# Patient Record
Sex: Male | Born: 1935 | Race: White | Hispanic: No | Smoking: Former smoker
Health system: Southern US, Community
[De-identification: ages and names within clinical notes are randomized; demographics above are authoritative.]

## PROBLEM LIST (undated history)

## (undated) DIAGNOSIS — R0600 Dyspnea, unspecified: Secondary | ICD-10-CM

## (undated) DIAGNOSIS — M4800 Spinal stenosis, site unspecified: Secondary | ICD-10-CM

## (undated) DIAGNOSIS — I499 Cardiac arrhythmia, unspecified: Secondary | ICD-10-CM

## (undated) DIAGNOSIS — H9190 Unspecified hearing loss, unspecified ear: Secondary | ICD-10-CM

## (undated) DIAGNOSIS — Z87442 Personal history of urinary calculi: Secondary | ICD-10-CM

## (undated) DIAGNOSIS — N200 Calculus of kidney: Secondary | ICD-10-CM

## (undated) DIAGNOSIS — K219 Gastro-esophageal reflux disease without esophagitis: Secondary | ICD-10-CM

## (undated) DIAGNOSIS — M25473 Effusion, unspecified ankle: Secondary | ICD-10-CM

## (undated) DIAGNOSIS — I10 Essential (primary) hypertension: Secondary | ICD-10-CM

## (undated) DIAGNOSIS — M1990 Unspecified osteoarthritis, unspecified site: Secondary | ICD-10-CM

## (undated) DIAGNOSIS — F419 Anxiety disorder, unspecified: Secondary | ICD-10-CM

## (undated) HISTORY — PX: KNEE SURGERY: SHX244

## (undated) HISTORY — PX: TONSILLECTOMY: SUR1361

## (undated) HISTORY — PX: OTHER SURGICAL HISTORY: SHX169

---

## 1978-11-13 HISTORY — PX: CYSTOSCOPY W/ URETEROSCOPY: SUR379

## 1997-11-25 DIAGNOSIS — C4492 Squamous cell carcinoma of skin, unspecified: Secondary | ICD-10-CM

## 1997-11-25 HISTORY — DX: Squamous cell carcinoma of skin, unspecified: C44.92

## 2000-04-17 ENCOUNTER — Encounter: Payer: Self-pay | Admitting: Internal Medicine

## 2000-04-17 ENCOUNTER — Encounter: Admission: RE | Admit: 2000-04-17 | Discharge: 2000-04-17 | Payer: Self-pay | Admitting: Internal Medicine

## 2001-01-30 ENCOUNTER — Encounter (INDEPENDENT_AMBULATORY_CARE_PROVIDER_SITE_OTHER): Payer: Self-pay | Admitting: Specialist

## 2001-01-30 ENCOUNTER — Other Ambulatory Visit: Admission: RE | Admit: 2001-01-30 | Discharge: 2001-01-30 | Payer: Self-pay | Admitting: Gastroenterology

## 2004-01-08 ENCOUNTER — Encounter: Admission: RE | Admit: 2004-01-08 | Discharge: 2004-01-08 | Payer: Self-pay | Admitting: Internal Medicine

## 2004-01-22 ENCOUNTER — Encounter: Admission: RE | Admit: 2004-01-22 | Discharge: 2004-01-22 | Payer: Self-pay | Admitting: Internal Medicine

## 2004-11-21 DIAGNOSIS — C4491 Basal cell carcinoma of skin, unspecified: Secondary | ICD-10-CM

## 2004-11-21 HISTORY — DX: Basal cell carcinoma of skin, unspecified: C44.91

## 2005-10-16 ENCOUNTER — Encounter: Admission: RE | Admit: 2005-10-16 | Discharge: 2005-10-16 | Payer: Self-pay | Admitting: Internal Medicine

## 2006-10-08 ENCOUNTER — Ambulatory Visit (HOSPITAL_COMMUNITY): Admission: RE | Admit: 2006-10-08 | Discharge: 2006-10-08 | Payer: Self-pay | Admitting: Internal Medicine

## 2007-04-23 ENCOUNTER — Encounter: Admission: RE | Admit: 2007-04-23 | Discharge: 2007-04-23 | Payer: Self-pay | Admitting: Internal Medicine

## 2007-08-26 DIAGNOSIS — C4491 Basal cell carcinoma of skin, unspecified: Secondary | ICD-10-CM

## 2007-08-26 HISTORY — DX: Basal cell carcinoma of skin, unspecified: C44.91

## 2008-06-02 ENCOUNTER — Encounter: Admission: RE | Admit: 2008-06-02 | Discharge: 2008-06-02 | Payer: Self-pay | Admitting: Internal Medicine

## 2008-07-17 DIAGNOSIS — C4492 Squamous cell carcinoma of skin, unspecified: Secondary | ICD-10-CM

## 2008-07-17 HISTORY — DX: Squamous cell carcinoma of skin, unspecified: C44.92

## 2012-04-07 ENCOUNTER — Emergency Department (HOSPITAL_COMMUNITY)
Admission: EM | Admit: 2012-04-07 | Discharge: 2012-04-07 | Disposition: A | Discharge status: Home / Self Care | Payer: Medicare Other | Attending: Emergency Medicine | Admitting: Emergency Medicine

## 2012-04-07 ENCOUNTER — Encounter (HOSPITAL_COMMUNITY): Payer: Self-pay | Admitting: Emergency Medicine

## 2012-04-07 DIAGNOSIS — N4889 Other specified disorders of penis: Secondary | ICD-10-CM | POA: Insufficient documentation

## 2012-04-07 DIAGNOSIS — I10 Essential (primary) hypertension: Secondary | ICD-10-CM | POA: Insufficient documentation

## 2012-04-07 DIAGNOSIS — B372 Candidiasis of skin and nail: Secondary | ICD-10-CM

## 2012-04-07 HISTORY — DX: Calculus of kidney: N20.0

## 2012-04-07 HISTORY — DX: Essential (primary) hypertension: I10

## 2012-04-07 NOTE — ED Provider Notes (Signed)
History     CSN: [id number]  Arrival date & time 04/07/12  1034   First MD Initiated Contact with Patient 04/07/12 1115      Chief Complaint  Patient presents with  . Groin Swelling    hx of recurrent penile swelling, concerned about swelling today    (Consider location/radiation/quality/duration/timing/severity/associated sxs/prior treatment) HPI Patient presents emergency Dept. with a two-day history penile irritation and redness.  Patient, states he has had previous bouts with a similar type issue.  Patient, states he has been using some Lotrimin on it over the last 24 hours.  Patient states that he noticed a little bleeding from the area today.  Patient denies penile discharge, fever, dysuria, or back pain.  States that his testicles are not painful or swollen. Past Medical History  Diagnosis Date  . Hypertension   . Kidney stone     Past Surgical History  Procedure Date  . Tonsillectomy   . Addnoid     Family History  Problem Relation Age of Onset  . Heart failure Father     History  Substance Use Topics  . Smoking status: Never Smoker   . Smokeless tobacco: Not on file  . Alcohol Use: Yes      Review of Systems All other systems negative except as documented in the HPI. All pertinent positives and negatives as reviewed in the HPI.  Allergies  Review of patient's allergies indicates no known allergies.  Home Medications   Current Outpatient Rx  Name Route Sig Dispense Refill  . ASPIRIN 81 MG PO TABS Oral Take 81 mg by mouth daily.    . ATORVASTATIN CALCIUM 40 MG PO TABS Oral Take 40 mg by mouth daily.    Marland Kitchen CLONAZEPAM 0.5 MG PO TABS Oral Take 0.5 mg by mouth 2 (two) times daily.      BP 165/77  Pulse 58  Temp(Src) 97.8 F (36.6 C) (Oral)  Resp 18  SpO2 98%  Physical Exam  Constitutional: He appears well-developed and well-nourished.  Cardiovascular: Normal rate and regular rhythm.   Pulmonary/Chest: Effort normal and breath sounds normal.    Genitourinary: Testes normal.    Penile erythema and penile tenderness present. No discharge found.  Skin: Skin is warm and dry. There is erythema.    ED Course  Procedures (including critical care time) Patient is told to use the Lotrimin twice a day and follow up with his doctor Wednesday, and there is no improvement of his symptoms.  Told to return here for any worsening in his condition.  This is most likely fungal type infection.   MDM          Carlyle Dolly, PA-C 04/07/12 1142  Carlyle Dolly, PA-C 04/07/12 1146

## 2012-04-07 NOTE — ED Notes (Signed)
Pt reports 2 day hx of penile pain and redress, with slight bleeding. Tx with lotrimin cream. Hx of sx for two years

## 2012-04-07 NOTE — ED Provider Notes (Signed)
He has had several days of redness and itching of his penis. No trauma. No dysuria. No pain.  Penis exam: He is circumcised. No paraphimosis. Mild redness in ulceration of the glans penis. No discharge from the urethra. No swelling or deformity. Testes and scrotum normal bilaterally.  Medical screening examination/treatment/procedure(s) were conducted as a shared visit with non-physician practitioner(s) and myself.  I personally evaluated the patient during the encounter  Flint Melter, MD 04/07/12 [phone]

## 2012-04-07 NOTE — Discharge Instructions (Signed)
Keep area clean and dry.  He is a Lotrimin twice a day and followup with your doctor if not improving by Wednesday.

## 2012-07-05 ENCOUNTER — Other Ambulatory Visit: Payer: Self-pay | Admitting: Physician Assistant

## 2012-09-16 ENCOUNTER — Emergency Department (HOSPITAL_COMMUNITY): Payer: Medicare Other

## 2012-09-16 ENCOUNTER — Emergency Department (HOSPITAL_COMMUNITY)
Admission: EM | Admit: 2012-09-16 | Discharge: 2012-09-16 | Disposition: A | Discharge status: Home / Self Care | Payer: Medicare Other | Attending: Emergency Medicine | Admitting: Emergency Medicine

## 2012-09-16 ENCOUNTER — Encounter (HOSPITAL_COMMUNITY): Payer: Self-pay

## 2012-09-16 DIAGNOSIS — Z79899 Other long term (current) drug therapy: Secondary | ICD-10-CM | POA: Insufficient documentation

## 2012-09-16 DIAGNOSIS — Z87891 Personal history of nicotine dependence: Secondary | ICD-10-CM | POA: Insufficient documentation

## 2012-09-16 DIAGNOSIS — Z7982 Long term (current) use of aspirin: Secondary | ICD-10-CM | POA: Insufficient documentation

## 2012-09-16 DIAGNOSIS — N200 Calculus of kidney: Secondary | ICD-10-CM | POA: Insufficient documentation

## 2012-09-16 DIAGNOSIS — F411 Generalized anxiety disorder: Secondary | ICD-10-CM | POA: Insufficient documentation

## 2012-09-16 DIAGNOSIS — N23 Unspecified renal colic: Secondary | ICD-10-CM

## 2012-09-16 DIAGNOSIS — I10 Essential (primary) hypertension: Secondary | ICD-10-CM | POA: Insufficient documentation

## 2012-09-16 HISTORY — DX: Anxiety disorder, unspecified: F41.9

## 2012-09-16 LAB — COMPREHENSIVE METABOLIC PANEL
Albumin: 3.9 g/dL (ref 3.5–5.2)
BUN: 13 mg/dL (ref 6–23)
Calcium: 9.4 mg/dL (ref 8.4–10.5)
Creatinine, Ser: 0.98 mg/dL (ref 0.50–1.35)
GFR calc Af Amer: 85 mL/min — ABNORMAL LOW (ref >90)
Glucose, Bld: 102 mg/dL — ABNORMAL HIGH (ref 70–99)
Total Protein: 7.4 g/dL (ref 6.0–8.3)

## 2012-09-16 LAB — CBC
HCT: 43 % (ref 39.0–52.0)
MCV: 91.5 fL (ref 78.0–100.0)
RBC: 4.7 MIL/uL (ref 4.22–5.81)
RDW: 12.7 % (ref 11.5–15.5)
WBC: 10.5 10*3/uL (ref 4.0–10.5)

## 2012-09-16 LAB — URINALYSIS, ROUTINE W REFLEX MICROSCOPIC
Bilirubin Urine: NEGATIVE
Hgb urine dipstick: NEGATIVE
Ketones, ur: NEGATIVE mg/dL
Nitrite: NEGATIVE
Protein, ur: NEGATIVE mg/dL
Specific Gravity, Urine: 1.02 (ref 1.005–1.030)
Urobilinogen, UA: 0.2 mg/dL (ref 0.0–1.0)

## 2012-09-16 LAB — POCT I-STAT TROPONIN I: Troponin i, poc: 0 ng/mL (ref 0.00–0.08)

## 2012-09-16 MED ORDER — ONDANSETRON 8 MG PO TBDP: ORAL_TABLET | ORAL | Status: DC

## 2012-09-16 MED ORDER — SODIUM CHLORIDE 0.9 % IV BOLUS (SEPSIS)
500.0000 mL | Freq: Once | INTRAVENOUS | Status: AC
  Administered 2012-09-16: 500 mL via INTRAVENOUS

## 2012-09-16 MED ORDER — FENTANYL CITRATE 0.05 MG/ML IJ SOLN
100.0000 ug | Freq: Once | INTRAMUSCULAR | Status: AC
  Administered 2012-09-16: 100 ug via INTRAVENOUS
  Filled 2012-09-16: qty 2

## 2012-09-16 MED ORDER — ONDANSETRON HCL 4 MG/2ML IJ SOLN
4.0000 mg | Freq: Once | INTRAMUSCULAR | Status: AC
  Administered 2012-09-16: 4 mg via INTRAVENOUS
  Filled 2012-09-16: qty 2

## 2012-09-16 MED ORDER — OXYCODONE-ACETAMINOPHEN 5-325 MG PO TABS: 2.0000 | ORAL_TABLET | ORAL | Status: DC | PRN

## 2012-09-16 MED ORDER — SODIUM CHLORIDE 0.9 % IV SOLN: Freq: Once | INTRAVENOUS | Status: DC

## 2012-09-16 MED ORDER — SODIUM CHLORIDE 0.9 % IV SOLN: INTRAVENOUS | Status: DC

## 2012-09-16 NOTE — ED Notes (Signed)
Pt presents with NAD- Pt c/o of left side flank pain- denies N/V/d and fever and trouble urinating.

## 2012-09-16 NOTE — ED Provider Notes (Signed)
History     CSN: [id number]  Arrival date & time 09/16/12  0158   First MD Initiated Contact with Patient 09/16/12 0308      Chief Complaint  Patient presents with  . Flank Pain    (Consider location/radiation/quality/duration/timing/severity/associated sxs/prior treatment) HPI This 78 year old complains of a two-day history of intermittent left flank colicky pain now present for over 12 hours of nausea without vomiting or diarrhea. He is no dysuria or testicular pain. He is no chest pain cough or shortness of breath. He is no midline back pain or pain down his legs. He is no weakness or numbness to his legs. He is no lightheadedness or syncope. There is no treatment prior to arrival. His last episode of kidney stones as in the 1980s. There's been no trauma or fever. His pain is moderately severe. His pain waxes and wanes in intensity. Past Medical History  Diagnosis Date  . Hypertension   . Kidney stone   . Anxiety     Past Surgical History  Procedure Date  . Tonsillectomy   . Addnoid   . Knee surgery     Family History  Problem Relation Age of Onset  . Heart failure Father     History  Substance Use Topics  . Smoking status: Former Games developer  . Smokeless tobacco: Not on file  . Alcohol Use: Yes      Review of Systems 10 Systems reviewed and are negative for acute change except as noted in the HPI. Allergies  Review of patient's allergies indicates no known allergies.  Home Medications   Current Outpatient Rx  Name  Route  Sig  Dispense  Refill  . ASPIRIN 81 MG PO TABS   Oral   Take 81 mg by mouth daily.         . ATORVASTATIN CALCIUM 40 MG PO TABS   Oral   Take 40 mg by mouth daily.         Marland Kitchen CLONAZEPAM 0.5 MG PO TABS   Oral   Take 1 mg by mouth at bedtime.          Marland Kitchen ONDANSETRON 8 MG PO TBDP      8mg  ODT q4 hours prn nausea   4 tablet   0   . OXYCODONE-ACETAMINOPHEN 5-325 MG PO TABS   Oral   Take 2 tablets by mouth every 4 (four) hours  as needed for pain.   20 tablet   0     BP 117/48  Pulse 77  Temp 98 F (36.7 C) (Oral)  Resp 15  Ht 5\' 7"  (1.702 m)  Wt 168 lb (76.204 kg)  BMI 26.31 kg/m2  SpO2 95%  Physical Exam  Nursing note and vitals reviewed. Constitutional:       Awake, alert, nontoxic appearance.  HENT:  Head: Atraumatic.  Eyes: Right eye exhibits no discharge. Left eye exhibits no discharge.  Neck: Neck supple.  Cardiovascular: Normal rate and regular rhythm.   No murmur heard. Pulmonary/Chest: Effort normal and breath sounds normal. No respiratory distress. He has no wheezes. He has no rales. He exhibits no tenderness.  Abdominal: Soft. Bowel sounds are normal. He exhibits no distension and no mass. There is tenderness. There is no rebound and no guarding.       Moderate left lower quadrant abdominal tenderness and left flank tenderness without rebound; no midline back tenderness.  Musculoskeletal: He exhibits no edema and no tenderness.       Baseline ROM, no  obvious new focal weakness.  Neurological: He is alert.       Mental status and motor strength appears baseline for patient and situation.  Skin: No rash noted.  Psychiatric: He has a normal mood and affect.    ED Course  Procedures (including critical care time) ECG: Sinus rhythm, ventricular rate 59, normal axis, normal intervals, no acute ischemic changes noted, no comparison ECG available   Labs Reviewed  COMPREHENSIVE METABOLIC PANEL - Abnormal; Notable for the following:    Glucose, Bld 102 (*)     GFR calc non Af Amer 73 (*)     GFR calc Af Amer 85 (*)     All other components within normal limits  URINALYSIS, ROUTINE W REFLEX MICROSCOPIC  CBC  LIPASE, BLOOD  POCT I-STAT TROPONIN I   Ct Abdomen Pelvis Wo Contrast  09/16/2012  *RADIOLOGY REPORT*  Clinical Data: Left flank pain.  History of renal stones.  CT ABDOMEN AND PELVIS WITHOUT CONTRAST  Technique:  Multidetector CT imaging of the abdomen and pelvis was performed  following the standard protocol without intravenous contrast.  Comparison: None.  Findings: Chronic peripheral changes are noted at the lung bases; the visualized lung bases are otherwise clear.  Scattered coronary artery calcifications are seen.  The liver and spleen are unremarkable in appearance.  The gallbladder is within normal limits.  The pancreas and adrenal glands are unremarkable.  There is mild left-sided hydronephrosis, with prominence of the left ureter to the level of an obstructing 7 x 4 mm stone in the proximal to mid left ureter, 7 cm below the left renal hilum. Associated prominent left-sided perinephric stranding is seen.  More mild right-sided perinephric stranding is noted.  A focus of increased attenuation at the upper pole of the right kidney is nonspecific, though on coronal images the appearance suggests milk of magnesia.  A 1.9 cm cyst is noted at the medial aspect of the left kidney.  No free fluid is identified.  The small bowel is unremarkable in appearance.  The stomach is within normal limits.  No acute vascular abnormalities are seen.  Prominent calcification is noted along the abdominal aorta and its branches.  The appendix is not definitely characterized; there is no evidence for appendicitis.  Incidental note is made of a moderate to large left-sided inguinal hernia, containing a short segment of proximal sigmoid colon, without evidence for obstruction or strangulation. A small to moderate right-sided inguinal hernia is also seen, containing only fat.  The colon is otherwise unremarkable in appearance.  The bladder is mildly distended and grossly unremarkable in appearance.  The prostate remains normal in size.  No inguinal lymphadenopathy is seen.  No acute osseous abnormalities are identified.  IMPRESSION:  1.  Mild left-sided hydronephrosis, with an obstructing 7 x 4 mm stone noted in the proximal to mid left ureter, 7 cm below the left renal hilum. 2.  Moderate to large  left-sided inguinal hernia, containing a short segment of proximal sigmoid colon, without evidence for obstruction or strangulation. 3.  Small to moderate right-sided inguinal hernia, containing only fat. 4.  Prominent calcification along the abdominal aorta and its branches; mild scattered coronary artery calcifications seen. 5.  Likely focus of milk of magnesia at the right kidney. 6.  Chronic peripheral changes noted at the lung bases.   Original Report Authenticated By: Tonia Ghent, M.D.      1. Ureteral colic       MDM  Pt stable in ED with  no significant deterioration in condition.  Patient / Family / Caregiver informed of clinical course, understand medical decision-making process, and agree with plan.  I doubt any other EMC precluding discharge at this time including, but not necessarily limited to the following:AAA.        Hurman Horn, MD 09/16/12 [phone]

## 2012-09-16 NOTE — ED Notes (Signed)
Pt states that he began having left flank pain on Saturday that has become progressively worse. Pt states he had a kidney stone in 1980 and that this feels very similar.

## 2012-09-24 ENCOUNTER — Other Ambulatory Visit: Payer: Self-pay | Admitting: Urology

## 2012-09-25 ENCOUNTER — Encounter (HOSPITAL_COMMUNITY): Payer: Self-pay | Admitting: *Deleted

## 2012-09-25 NOTE — Pre-Procedure Instructions (Signed)
Asked to bring blue folder the day of the procedure,insurance card,I.D. driver's license,wear comfortable clothing and have a driver for the day. Asked not to take Advil,Motrin,Ibuprofen,Aleve or any NSAIDS, Aspirin, or Toradol for 72 hours prior to procedure,  No vitamins or herbal medications 7 days prior to procedure.  Will stop Aspirin per policy. Instructed to take laxative per doctor's office instructions and eat a light dinner the evening before procedure.   To arrive at 0915 for lithotripsy procedure.

## 2012-09-30 ENCOUNTER — Encounter (HOSPITAL_COMMUNITY): Payer: Self-pay | Admitting: *Deleted

## 2012-09-30 ENCOUNTER — Encounter (HOSPITAL_COMMUNITY)
Admission: RE | Disposition: A | Discharge status: Home / Self Care | Payer: Self-pay | Source: Ambulatory Visit | Attending: Urology

## 2012-09-30 ENCOUNTER — Ambulatory Visit (HOSPITAL_COMMUNITY): Payer: Medicare Other

## 2012-09-30 ENCOUNTER — Ambulatory Visit (HOSPITAL_COMMUNITY)
Admission: RE | Admit: 2012-09-30 | Discharge: 2012-09-30 | Disposition: A | Discharge status: Home / Self Care | Payer: Medicare Other | Source: Ambulatory Visit | Attending: Urology | Admitting: Urology

## 2012-09-30 DIAGNOSIS — N201 Calculus of ureter: Secondary | ICD-10-CM | POA: Insufficient documentation

## 2012-09-30 HISTORY — DX: Gastro-esophageal reflux disease without esophagitis: K21.9

## 2012-09-30 SURGERY — LITHOTRIPSY, ESWL
Anesthesia: LOCAL | Laterality: Left

## 2012-09-30 MED ORDER — SODIUM CHLORIDE 0.9 % IV SOLN
INTRAVENOUS | Status: DC
  Administered 2012-09-30: 10:00:00 via INTRAVENOUS

## 2012-09-30 MED ORDER — CEFAZOLIN SODIUM-DEXTROSE 2-3 GM-% IV SOLR
2.0000 g | INTRAVENOUS | Status: AC
  Administered 2012-09-30: 2 g via INTRAVENOUS
  Filled 2012-09-30: qty 50

## 2012-09-30 MED ORDER — DIPHENHYDRAMINE HCL 25 MG PO CAPS
25.0000 mg | ORAL_CAPSULE | ORAL | Status: AC
  Administered 2012-09-30: 25 mg via ORAL
  Filled 2012-09-30: qty 1

## 2012-09-30 MED ORDER — DIAZEPAM 5 MG PO TABS
10.0000 mg | ORAL_TABLET | ORAL | Status: AC
  Administered 2012-09-30: 10 mg via ORAL
  Filled 2012-09-30: qty 2

## 2012-09-30 NOTE — H&P (Signed)
Stephen Cuevas is an 78 y.o. male.   Chief Complaint: Pre-Op Left Shockwave Lithotripsy HPI:   1 - Left Ureteral Stone - Pt with left mid 7x62mm stone by CT 09/16/2012 found on w/u of flank pain. Follow-Up KUB confirms in same location at L4-L5. No additional stones. No fever or infectious parameters. One piror episode treated with URS in 1980s. Pain presently controlled and pt straining urine.  PMH unremarkable. No CV disease. 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  Today Molly Maduro is seen for shockwave lithotripsy. Denies interval fevers or stone passage. His UCX from the office was negative.   Past Medical History  Diagnosis Date  . Hypertension   . Anxiety   . Kidney stone   . GERD (gastroesophageal reflux disease)     no treatment    Past Surgical History  Procedure Date  . Tonsillectomy   . Addnoid   . Knee surgery   . Cystoscopy w/ ureteroscopy 1980    Family History  Problem Relation Age of Onset  . Heart failure Father    Social History:  reports that he quit smoking about 56 years ago. He does not have any smokeless tobacco history on file. He reports that he drinks alcohol. He reports that he does not use illicit drugs.  Allergies: No Known Allergies  No prescriptions prior to admission    No results found for this or any previous visit (from the past 48 hour(s)). No results found.  Review of Systems  Constitutional: Negative.  Negative for fever and chills.  HENT: Positive for hearing loss.   Eyes: Negative.   Respiratory: Negative.   Cardiovascular: Negative.   Gastrointestinal: Negative.   Genitourinary: Negative.  Negative for hematuria.  Musculoskeletal: Negative.   Skin: Negative.   Neurological: Negative.   Endo/Heme/Allergies: Negative.   Psychiatric/Behavioral: Negative.     There were no vitals taken for this visit. Physical Exam  Constitutional: He is oriented to person, place, and time.  He appears well-developed and well-nourished.       Hard of hearing, but otherwise very vigorous for age  HENT:  Head: Normocephalic and atraumatic.  Eyes: EOM are normal. Pupils are equal, round, and reactive to light.  Neck: Normal range of motion. Neck supple.  Cardiovascular: Normal rate and regular rhythm.   Respiratory: Effort normal and breath sounds normal.  GI: Soft. Bowel sounds are normal.  Genitourinary: Penis normal.       Mild Left CVAT  Musculoskeletal: Normal range of motion.  Neurological: He is alert and oriented to person, place, and time.  Skin: Skin is warm and dry.  Psychiatric: He has a normal mood and affect. His behavior is normal. Judgment and thought content normal.     Assessment/Plan  1 - Left Ureteral Stone - We  Again discussed shockwave lithotripsy in detail as well as my "rule of 9s" with stones <35mm, less than 900 HU, and skin to stone distance <9cm having approximately 90% treatment success with single session of treatment. We then addressed how stones that are larger, more dense, and in patients with less favorable anatomy have incrementally decreased success rates. We discussed risks including, bleeding, infection, hematoma, loss of kidney, need for staged therapy, need for adjunctive therapy and requirement to refrain from any anticoagulants, anti-platelet or aspirin-like products peri-procedureally.   Proceed with Left SWL today. Has f/u 10/14/12 at 1:15 pm for KUB and MD visit.  Helane Briceno  09/30/2012, 6:32 AM

## 2012-10-30 ENCOUNTER — Ambulatory Visit
Admission: RE | Admit: 2012-10-30 | Discharge: 2012-10-30 | Disposition: A | Discharge status: Home / Self Care | Payer: Medicare Other | Source: Ambulatory Visit | Attending: Internal Medicine | Admitting: Internal Medicine

## 2012-10-30 ENCOUNTER — Other Ambulatory Visit: Payer: Self-pay | Admitting: Internal Medicine

## 2012-10-30 DIAGNOSIS — S6990XA Unspecified injury of unspecified wrist, hand and finger(s), initial encounter: Secondary | ICD-10-CM

## 2013-03-24 ENCOUNTER — Other Ambulatory Visit: Payer: Self-pay | Admitting: Dermatology

## 2013-04-23 ENCOUNTER — Encounter (INDEPENDENT_AMBULATORY_CARE_PROVIDER_SITE_OTHER): Payer: Self-pay | Admitting: Surgery

## 2013-04-23 ENCOUNTER — Ambulatory Visit (INDEPENDENT_AMBULATORY_CARE_PROVIDER_SITE_OTHER): Payer: Medicare Other | Admitting: Surgery

## 2013-04-23 VITALS — BP 140/76 | HR 60 | Temp 97.8°F | Resp 16 | Ht 64.0 in | Wt 169.6 lb

## 2013-04-23 DIAGNOSIS — K4020 Bilateral inguinal hernia, without obstruction or gangrene, not specified as recurrent: Secondary | ICD-10-CM

## 2013-04-23 NOTE — Progress Notes (Signed)
Referred for bilateral inguinal herniae  Chief Complaint:  Left inguinal hernia with some discomfort noted after lithotripsy  History of Present Illness:  Stephen Cuevas is an 79 y.o. male who comes in with his son, Stephen Cuevas who is an old neighbor of mine with a inguinal hernia. 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 On CT scan which was done to manage a kidney stone he was seen to have a inguinal hernia. He's been having some pains in his legs when he drives that is not typical for seen hernia. I question whether he could be having some sciatic nerve pain. He denies any nausea or vomiting and I did educate him on what to look for in case he is obstructed from this.  I discussed management strategies including observation, open and laparoscopic hernia repairs. I examined him and found his testes were normal and he had bilateral inguinal hernias with the left side being larger than the right.  Past Medical History  Diagnosis Date  . Hypertension   . Anxiety   . Kidney stone   . GERD (gastroesophageal reflux disease)     no treatment    Past Surgical History  Procedure Laterality Date  . Tonsillectomy    . Addnoid    . Knee surgery    . Cystoscopy w/ ureteroscopy  1980    Current Outpatient Prescriptions  Medication Sig Dispense Refill  . aspirin 81 MG tablet Take 81 mg by mouth daily.      Marland Kitchen atorvastatin (LIPITOR) 40 MG tablet Take 40 mg by mouth daily.      . clonazePAM (KLONOPIN) 0.5 MG tablet Take 1 mg by mouth at bedtime.       . clotrimazole (LOTRIMIN) 1 % cream Apply 1 application topically daily.       No current facility-administered medications for this visit.   Review of patient's allergies indicates no known allergies. Family History  Problem Relation Age of Onset  . Heart failure Father   . Heart disease Father    Social History:   reports that he quit smoking about 57 years ago. He has never used smokeless tobacco. He reports that  drinks  alcohol. He reports that he does not use illicit drugs.   REVIEW OF SYSTEMS - PERTINENT POSITIVES ONLY: Active where were the works at [employer] hospital  Physical Exam:   Blood pressure 140/76, pulse 60, temperature 97.8 F (36.6 C), temperature source Temporal, resp. rate 16, height 5\' 4"  (1.626 m), weight 169 lb 9.6 oz (76.93 kg). Body mass index is 29.1 kg/(m^2).  Gen:  WDWN white male NAD  Neurological: Alert and oriented to person, place, and time. Motor and sensory function is grossly intact  Head: Normocephalic and atraumatic.  Eyes: Conjunctivae are normal. Pupils are equal, round, and reactive to light. No scleral icterus.  Neck: Normal range of motion. Neck supple. No tracheal deviation or thyromegaly present.  Cardiovascular:  SR without murmurs or gallops.  No carotid bruits Respiratory: Effort normal.  No respiratory distress. No chest wall tenderness. Breath sounds normal.  No wheezes, rales or rhonchi.  Abdomen:  Bilateral inguinal hernias the left being bigger than the right. GU: Musculoskeletal: Normal range of motion. Extremities are nontender. No cyanosis, edema or clubbing noted Lymphadenopathy: No cervical, preauricular, postauricular or axillary adenopathy is present Skin: Skin is warm and dry. No rash noted. No diaphoresis. No erythema. No pallor. Pscyh: Normal mood and affect. Behavior is normal. Judgment and  thought content normal.   LABORATORY RESULTS: No results found for this or any previous visit (from the past 48 hour(s)).  RADIOLOGY RESULTS: No results found.  Problem List: There are no active problems to display for this patient.   Assessment & Plan:  Bilateral inguinal hernias left greater than right I think it would be reasonable to manage these expectantly. I am not convinced that his left leg pain is related to his hernia as it could well be a sciatica or it could also be because he wears his wall on the left side.he may look into getting a truss  but I told him that we could see him back in 4 months or sooner should he become more symptomatic. 4 months we assess how he is doing he may consider continued observation.    Matt B. Daphine Deutscher, MD, [facility] [phone] beeper [phone]  04/23/2013 2:13 PM

## 2013-04-23 NOTE — Patient Instructions (Signed)

## 2013-05-21 ENCOUNTER — Encounter (INDEPENDENT_AMBULATORY_CARE_PROVIDER_SITE_OTHER): Payer: Self-pay

## 2013-07-02 ENCOUNTER — Emergency Department (HOSPITAL_COMMUNITY)
Admission: EM | Admit: 2013-07-02 | Discharge: 2013-07-02 | Disposition: A | Discharge status: Home / Self Care | Payer: Medicare Other | Attending: Emergency Medicine | Admitting: Emergency Medicine

## 2013-07-02 ENCOUNTER — Emergency Department (HOSPITAL_COMMUNITY): Payer: Medicare Other

## 2013-07-02 ENCOUNTER — Encounter (HOSPITAL_COMMUNITY): Payer: Self-pay | Admitting: Emergency Medicine

## 2013-07-02 DIAGNOSIS — I10 Essential (primary) hypertension: Secondary | ICD-10-CM | POA: Insufficient documentation

## 2013-07-02 DIAGNOSIS — S93409A Sprain of unspecified ligament of unspecified ankle, initial encounter: Secondary | ICD-10-CM | POA: Insufficient documentation

## 2013-07-02 DIAGNOSIS — Z7982 Long term (current) use of aspirin: Secondary | ICD-10-CM | POA: Insufficient documentation

## 2013-07-02 DIAGNOSIS — F411 Generalized anxiety disorder: Secondary | ICD-10-CM | POA: Insufficient documentation

## 2013-07-02 DIAGNOSIS — Z79899 Other long term (current) drug therapy: Secondary | ICD-10-CM | POA: Insufficient documentation

## 2013-07-02 DIAGNOSIS — Y9269 Other specified industrial and construction area as the place of occurrence of the external cause: Secondary | ICD-10-CM | POA: Insufficient documentation

## 2013-07-02 DIAGNOSIS — Z9889 Other specified postprocedural states: Secondary | ICD-10-CM | POA: Insufficient documentation

## 2013-07-02 DIAGNOSIS — X503XXA Overexertion from repetitive movements, initial encounter: Secondary | ICD-10-CM | POA: Insufficient documentation

## 2013-07-02 DIAGNOSIS — Z8719 Personal history of other diseases of the digestive system: Secondary | ICD-10-CM | POA: Insufficient documentation

## 2013-07-02 DIAGNOSIS — Y9301 Activity, walking, marching and hiking: Secondary | ICD-10-CM | POA: Insufficient documentation

## 2013-07-02 DIAGNOSIS — Z87442 Personal history of urinary calculi: Secondary | ICD-10-CM | POA: Insufficient documentation

## 2013-07-02 DIAGNOSIS — Z87891 Personal history of nicotine dependence: Secondary | ICD-10-CM | POA: Insufficient documentation

## 2013-07-02 NOTE — ED Notes (Signed)
MD at bedside. 

## 2013-07-02 NOTE — ED Notes (Signed)
Pt c/o lt ankle pain.  Denies injury.  States his pain started Monday.  States it is swollen.

## 2013-07-02 NOTE — ED Provider Notes (Signed)
Medical screening examination/treatment/procedure(s) were conducted as a shared visit with non-physician practitioner(s) and myself.  I personally evaluated the patient during the encounter  Patient is overall well-appearing.  He has some tenderness and swelling of his left lateral ankle.  Normal pulses in left foot.  Full range of motion of left ankle.  Doubt septic arthritis.  No signs of arterial insufficiency.  Some small bruising noted on the lateral aspect of suggest possible ligamentous strain or injury.  Discharge home in good condition.  PCP and orthopedic followup recommended.  He sees [facility].  He understands return to the ER for new or worsening symptoms including but not limited to fever, worsening swelling, worsening pain, numbness tingling or weakness, or development of erythema or warmth.  Lyanne Co, MD 07/02/13 [phone]

## 2013-07-02 NOTE — ED Provider Notes (Signed)
CSN: [id number]     Arrival date & time 07/02/13  4098 History     First MD Initiated Contact with Patient 07/02/13 [phone]     Chief Complaint  Patient presents with  . Foot Pain   (Consider location/radiation/quality/duration/timing/severity/associated sxs/prior Treatment) HPI  Stephen Cuevas is a 79 y.o.male  presents to the ER with complaints of left ankle pain. When he stepped out of bed he noticed that his ankle was very painful. Once he continued walking on it he noticed that the pain started to alleviate with time. 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 He denies his ankle being tender to the touch. He denies swelling in his right ankle. NO redness or warm feeling He denies remembering a specific injury of fall. No weakness, SOB, chest pain, wheezing, hx of lower extremity swelling.   Past Medical History  Diagnosis Date  . Hypertension   . Anxiety   . Kidney stone   . GERD (gastroesophageal reflux disease)     no treatment   Past Surgical History  Procedure Laterality Date  . Tonsillectomy    . Addnoid    . Knee surgery    . Cystoscopy w/ ureteroscopy  1980   Family History  Problem Relation Age of Onset  . Heart failure Father   . Heart disease Father    History  Substance Use Topics  . Smoking status: Former Smoker -- 1.50 packs/day for 20 years    Quit date: 03/13/1956  . Smokeless tobacco: Never Used  . Alcohol Use: Yes    Review of Systems ROS is negative unless otherwise stated in the HPI  Allergies  Review of patient's allergies indicates no known allergies.  Home Medications   Current Outpatient Rx  Name  Route  Sig  Dispense  Refill  . aspirin EC 81 MG tablet   Oral   Take 81 mg by mouth every morning.         Marland Kitchen atorvastatin (LIPITOR) 40 MG tablet   Oral   Take 40 mg by mouth every morning.          . clonazePAM (KLONOPIN) 0.5 MG tablet   Oral   Take 1 mg by mouth at bedtime.         . clotrimazole (LOTRIMIN) 1 % cream   Topical   Apply 1 application topically daily as needed (for infection of penis).           BP 160/83  Pulse 69  Temp(Src) 98.7 F (37.1 C) (Oral)  Resp 20  SpO2 97% Physical Exam  Nursing note and vitals reviewed. Constitutional: He appears well-developed and well-nourished. No distress.  HENT:  Head: Normocephalic and atraumatic.   HEENT: Anicteric.  No pallor.  No discharge from ears, eyes, nose, or mouth.   Eyes: Pupils are equal, round, and reactive to light.  Neck: Normal range of motion. Neck supple.  Cardiovascular: Normal rate and regular rhythm.   Pulmonary/Chest: Effort normal.  Abdominal: Soft.  Musculoskeletal:       Left ankle: He exhibits swelling. He exhibits normal range of motion, no ecchymosis, no deformity, no laceration and normal pulse. Tenderness (talofibular ligament). Achilles tendon normal.  Neurological: He is alert.  Skin: Skin is warm and dry.    ED Course   Procedures (including critical care time)  Labs Reviewed - No data to display Dg Ankle Complete Left  07/02/2013   *RADIOLOGY REPORT*  Clinical Data: Foot pain  LEFT ANKLE COMPLETE - 3+ VIEW  Comparison: None.  Findings: The ankle mortise intact.  The talar dome is normal.  No malleolar fracture.  No joint effusion.  IMPRESSION: No acute osseous abnormality.   Original Report Authenticated By: Genevive Bi, M.D.   1. High ankle sprain, left, initial encounter     MDM  Dr. Patria Mane went to say hello to patient as well and discussed diagnosis and treatment plan.  Pt denies needing pain medication and advised to take tylenol at home. Declines pain medication here. 64 y.o.Stephen Stephen Cuevas's evaluation in the Emergency Department is complete. It has been determined that no acute conditions requiring further emergency intervention are present at this time. The patient/guardian have been advised of the diagnosis and plan. We have discussed signs  and symptoms that warrant return to the ED, such as changes or worsening in symptoms.  Vital signs are stable at discharge. Filed Vitals:   07/02/13 0846  BP: 160/83  Pulse: 69  Temp: 98.7 F (37.1 C)  Resp: 20    Patient/guardian has voiced understanding and agreed to follow-up with the PCP or specialist.   Dorthula Matas, PA-C 07/02/13 1101

## 2013-08-28 ENCOUNTER — Ambulatory Visit (INDEPENDENT_AMBULATORY_CARE_PROVIDER_SITE_OTHER): Payer: Medicare Other | Admitting: Surgery

## 2013-08-28 ENCOUNTER — Encounter (INDEPENDENT_AMBULATORY_CARE_PROVIDER_SITE_OTHER): Payer: Self-pay

## 2013-08-28 ENCOUNTER — Encounter (INDEPENDENT_AMBULATORY_CARE_PROVIDER_SITE_OTHER): Payer: Self-pay | Admitting: Surgery

## 2013-08-28 VITALS — BP 140/78 | HR 96 | Temp 97.2°F | Resp 16 | Ht 66.0 in | Wt 171.4 lb

## 2013-08-28 DIAGNOSIS — K4020 Bilateral inguinal hernia, without obstruction or gangrene, not specified as recurrent: Secondary | ICD-10-CM

## 2013-08-28 NOTE — Progress Notes (Signed)
[name] 79 y.o.  Body mass index is 27.68 kg/(m^2).  Patient Active Problem List   Diagnosis Date Noted  . Bilateral inguinal hernia 04/23/2013    No Known Allergies  Past Surgical History  Procedure Laterality Date  . Tonsillectomy    . Addnoid    . Knee surgery    . Cystoscopy w/ ureteroscopy  1980   GREEN, EDWIN JAY, MD No diagnosis found.  Followup with [name] and [name] 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 The bilateral hernias and not bother him that much. I think it is age he is lucky to have both mental faculties and recently good physical faculties present. I think doing in elective bilateral anal hernia repair on him would best be done under general and after cardiac clearance. However that being said I think I would have a tendency to observe him. I told him what to look for and hopefully he can avoid having surgery. I'll be glad to see him again as needed I told him to feel free to contact me when these at the hospital and sees me to talk about this problem. Return when necessary Matt B. Daphine Deutscher, MD, [facility] [phone] beeper [phone]  08/28/2013 11:54 AM

## 2013-08-28 NOTE — Patient Instructions (Signed)
Thanks for your patience.  If you need further assistance after leaving the office, please call our office and speak with a [facility] nurse.  [phone].  If you want to leave a message for Dr. Kearia Yin, please call his office phone at [phone]. 

## 2014-04-23 ENCOUNTER — Other Ambulatory Visit: Payer: Self-pay | Admitting: Internal Medicine

## 2014-04-23 ENCOUNTER — Ambulatory Visit
Admission: RE | Admit: 2014-04-23 | Discharge: 2014-04-23 | Disposition: A | Discharge status: Home / Self Care | Payer: Medicare Other | Source: Ambulatory Visit | Attending: Internal Medicine | Admitting: Internal Medicine

## 2014-04-23 DIAGNOSIS — M25552 Pain in left hip: Secondary | ICD-10-CM

## 2014-06-13 ENCOUNTER — Emergency Department (HOSPITAL_COMMUNITY): Payer: Medicare Other

## 2014-06-13 ENCOUNTER — Encounter (HOSPITAL_COMMUNITY): Payer: Self-pay | Admitting: Emergency Medicine

## 2014-06-13 ENCOUNTER — Emergency Department (HOSPITAL_COMMUNITY)
Admission: EM | Admit: 2014-06-13 | Discharge: 2014-06-13 | Disposition: A | Discharge status: Home / Self Care | Payer: Medicare Other | Attending: Emergency Medicine | Admitting: Emergency Medicine

## 2014-06-13 DIAGNOSIS — M25559 Pain in unspecified hip: Secondary | ICD-10-CM | POA: Diagnosis not present

## 2014-06-13 DIAGNOSIS — F411 Generalized anxiety disorder: Secondary | ICD-10-CM | POA: Diagnosis not present

## 2014-06-13 DIAGNOSIS — M25519 Pain in unspecified shoulder: Secondary | ICD-10-CM | POA: Diagnosis present

## 2014-06-13 DIAGNOSIS — K219 Gastro-esophageal reflux disease without esophagitis: Secondary | ICD-10-CM | POA: Diagnosis not present

## 2014-06-13 DIAGNOSIS — I10 Essential (primary) hypertension: Secondary | ICD-10-CM | POA: Insufficient documentation

## 2014-06-13 DIAGNOSIS — Z7982 Long term (current) use of aspirin: Secondary | ICD-10-CM | POA: Diagnosis not present

## 2014-06-13 DIAGNOSIS — Z79899 Other long term (current) drug therapy: Secondary | ICD-10-CM | POA: Diagnosis not present

## 2014-06-13 DIAGNOSIS — M25511 Pain in right shoulder: Secondary | ICD-10-CM

## 2014-06-13 DIAGNOSIS — Z87442 Personal history of urinary calculi: Secondary | ICD-10-CM | POA: Diagnosis not present

## 2014-06-13 DIAGNOSIS — Z87891 Personal history of nicotine dependence: Secondary | ICD-10-CM | POA: Insufficient documentation

## 2014-06-13 DIAGNOSIS — M25552 Pain in left hip: Secondary | ICD-10-CM

## 2014-06-13 MED ORDER — TRAMADOL HCL 50 MG PO TABS
50.0000 mg | ORAL_TABLET | Freq: Once | ORAL | Status: AC
  Administered 2014-06-13: 50 mg via ORAL
  Filled 2014-06-13: qty 1

## 2014-06-13 MED ORDER — TRAMADOL HCL 50 MG PO TABS: 50.0000 mg | ORAL_TABLET | Freq: Four times a day (QID) | ORAL | Status: DC | PRN

## 2014-06-13 NOTE — ED Provider Notes (Signed)
CSN: [id number]     Arrival date & time 06/13/14  5809 History   First MD Initiated Contact with Patient 06/13/14 1007     Chief Complaint  Patient presents with  . Shoulder Pain  . Leg Pain     (Consider location/radiation/quality/duration/timing/severity/associated sxs/prior Treatment) Patient is a 80 y.o. male presenting with shoulder pain and leg pain. The history is provided by the patient.  Shoulder Pain This is a chronic problem. The current episode started more than 1 week ago. The problem occurs daily. The problem has been gradually worsening. Pertinent negatives include no chest pain, no abdominal pain, no headaches and no shortness of breath. Exacerbated by: movement. The symptoms are relieved by rest and NSAIDs. He has tried acetaminophen for the symptoms. The treatment provided mild relief.  Leg Pain Location:  Hip Injury: no   Hip location:  L hip Pain details:    Quality:  Aching   Radiates to:  L leg   Severity:  Moderate   Onset quality:  Gradual   Timing:  Intermittent   Progression:  Worsening Chronicity:  New Dislocation: no   Foreign body present:  No foreign bodies Prior injury to area:  No Worsened by:  Activity, flexion, extension and bearing weight Ineffective treatments:  None tried Associated symptoms: no fever and no neck pain     Past Medical History  Diagnosis Date  . Hypertension   . Anxiety   . Kidney stone   . GERD (gastroesophageal reflux disease)     no treatment   Past Surgical History  Procedure Laterality Date  . Tonsillectomy    . Addnoid    . Knee surgery    . Cystoscopy w/ ureteroscopy  1980   Family History  Problem Relation Age of Onset  . Heart failure Father   . Heart disease Father    History  Substance Use Topics  . Smoking status: Former Smoker -- 1.50 packs/day for 20 years    Quit date: 03/13/1956  . Smokeless tobacco: Never Used  . Alcohol Use: Yes    Review of Systems  Constitutional: Negative for fever.   HENT: Negative for drooling and rhinorrhea.   Eyes: Negative for pain.  Respiratory: Negative for cough and shortness of breath.   Cardiovascular: Negative for chest pain and leg swelling.  Gastrointestinal: Negative for nausea, vomiting, abdominal pain and diarrhea.  Genitourinary: Negative for dysuria and hematuria.  Musculoskeletal: Negative for gait problem and neck pain.       Joint pain  Skin: Negative for color change.  Neurological: Negative for numbness and headaches.  Hematological: Negative for adenopathy.  Psychiatric/Behavioral: Negative for behavioral problems.  All other systems reviewed and are negative.     Allergies  Review of patient's allergies indicates no known allergies.  Home Medications   Prior to Admission medications   Medication Sig Start Date End Date Taking? Authorizing Provider  aspirin EC 81 MG tablet Take 81 mg by mouth every morning.    Historical Provider, MD  atorvastatin (LIPITOR) 40 MG tablet Take 40 mg by mouth every morning.     Historical Provider, MD  clonazePAM (KLONOPIN) 0.5 MG tablet Take 1 mg by mouth at bedtime.     Historical Provider, MD  clotrimazole (LOTRIMIN) 1 % cream Apply 1 application topically daily as needed (for infection of penis).     Historical Provider, MD  omeprazole (PRILOSEC) 20 MG capsule Take 20 mg by mouth 2 (two) times daily at 10 AM and  5 PM.     Historical Provider, MD   BP 157/79  Pulse 97  Temp(Src) 97.9 F (36.6 C) (Oral)  Resp 18  SpO2 99% Physical Exam  Nursing note and vitals reviewed. Constitutional: He is oriented to person, place, and time. He appears well-developed and well-nourished.  HENT:  Head: Normocephalic and atraumatic.  Right Ear: External ear normal.  Left Ear: External ear normal.  Nose: Nose normal.  Mouth/Throat: Oropharynx is clear and moist. No oropharyngeal exudate.  Eyes: Conjunctivae and EOM are normal. Pupils are equal, round, and reactive to light.  Neck: Normal range  of motion. Neck supple.  Cardiovascular: Normal rate, regular rhythm, normal heart sounds and intact distal pulses.  Exam reveals no gallop and no friction rub.   No murmur heard. Pulmonary/Chest: Effort normal and breath sounds normal. No respiratory distress. He has no wheezes.  Abdominal: Soft. Bowel sounds are normal. He exhibits no distension. There is no tenderness. There is no rebound and no guarding.  Musculoskeletal: Normal range of motion. He exhibits no edema and no tenderness.  Normal strength and sensation in all extremities.  2+ distal pulses.  Normal range of motion of the right shoulder. The patient did have reproduction of pain with range of motion of the right shoulder.  Mildly limited range of motion of the left hip due to pain.  Clonus was noted in the left lower extremity.  The right shoulder and left hip are normal in appearance.  Neurological: He is alert and oriented to person, place, and time.  Reflex Scores:      Patellar reflexes are 2+ on the right side and 2+ on the left side. Skin: Skin is warm and dry.  Psychiatric: He has a normal mood and affect. His behavior is normal.    ED Course  Procedures (including critical care time) Labs Review Labs Reviewed - No data to display  Imaging Review Dg Shoulder Right  06/13/2014   CLINICAL DATA:  Right shoulder pain radiating down the right arm for a few weeks.  EXAM: RIGHT SHOULDER - 2+ VIEW  COMPARISON:  Chest radiograph 04/23/2007  FINDINGS: There is mild spurring along the undersurface of the acromion. There is no evidence of acute fracture or dislocation. No lytic or blastic osseous lesion is seen. Visualized right lung is clear.  IMPRESSION: No acute osseous abnormality identified. Mild spurring along the undersurface of the acromion.   Electronically Signed   By: Logan Bores   On: 06/13/2014 11:57   Dg Hip Complete Left  06/13/2014   CLINICAL DATA:  Lateral left hip pain  EXAM: LEFT HIP - COMPLETE 2+ VIEW   COMPARISON:  04/23/2014  FINDINGS: There is no evidence of hip fracture or dislocation. There is no evidence of arthropathy or other focal bone abnormality.  IMPRESSION: Negative.   Electronically Signed   By: Conchita Paris M.D.   On: 06/13/2014 11:57     EKG Interpretation None      MDM   Final diagnoses:  Right shoulder pain  Left hip pain    10:41 AM 80 y.o. male who presents with chronic right shoulder and left hip pain. He notes acute worsening of pain in these joints over the last few weeks. He denies any injuries. States that he recently had a cortisone injection of the right shoulder with minimal benefit. He has been following with his PCP, Dr. Nyoka Cowden, for his joint pains. He denies any fevers, vomiting, or diarrhea. He is afebrile and vital  signs are unremarkable here. He has had decreased sleep due to his joint pain. He is currently taking Tylenol as needed for pain. Will start patient on tramadol and get screening imaging. Will recommend orthopedic followup.  1:04 PM: I interpreted/reviewed the labs and/or imaging which were non-contributory.  Pt would like to f/u w/ [name].  I have discussed the diagnosis/risks/treatment options with the patient and family and believe the pt to be eligible for discharge home to follow-up with [name], call for appt. We also discussed returning to the ED immediately if new or worsening sx occur. We discussed the sx which are most concerning (e.g., worsening pain, fever) that necessitate immediate return. Medications administered to the patient during their visit and any new prescriptions provided to the patient are listed below.  Medications given during this visit Medications  traMADol (ULTRAM) tablet 50 mg (50 mg Oral Given 06/13/14 1046)    Discharge Medication List as of 06/13/2014  1:06 PM    START taking these medications   Details  traMADol (ULTRAM) 50 MG tablet Take 1 tablet (50 mg total) by mouth every 6 (six) hours as needed.,  Starting 06/13/2014, Until Discontinued, Print         Blanchard Kelch, MD 06/13/14 1525

## 2014-06-13 NOTE — ED Notes (Signed)
Pt returned from XRAY 

## 2014-06-13 NOTE — ED Notes (Signed)
Pt transported to [place] by Sam.

## 2014-06-13 NOTE — ED Notes (Addendum)
Pt here with complaint with ongoing right shoulder and leg pain for weeks.   Pt reports bilateral hernia resulting in right leg pain. Pt informed to limit vigorous activity. Per son pt continues to do aggressive yard work. Pt reports painful and slow when starting to walk.  Pt seen at PCP and given cortisone shot to right shoulder. Pt reports pain unrelieved.

## 2014-06-13 NOTE — ED Notes (Signed)
MD Steinl at bedside. 

## 2014-07-07 ENCOUNTER — Encounter (HOSPITAL_COMMUNITY): Payer: Self-pay | Admitting: Emergency Medicine

## 2014-07-07 ENCOUNTER — Emergency Department (HOSPITAL_COMMUNITY)
Admission: EM | Admit: 2014-07-07 | Discharge: 2014-07-07 | Disposition: A | Discharge status: Home / Self Care | Payer: Medicare Other | Attending: Emergency Medicine | Admitting: Emergency Medicine

## 2014-07-07 DIAGNOSIS — Z87891 Personal history of nicotine dependence: Secondary | ICD-10-CM | POA: Diagnosis not present

## 2014-07-07 DIAGNOSIS — Z87442 Personal history of urinary calculi: Secondary | ICD-10-CM | POA: Insufficient documentation

## 2014-07-07 DIAGNOSIS — R609 Edema, unspecified: Secondary | ICD-10-CM

## 2014-07-07 DIAGNOSIS — I10 Essential (primary) hypertension: Secondary | ICD-10-CM | POA: Insufficient documentation

## 2014-07-07 DIAGNOSIS — F411 Generalized anxiety disorder: Secondary | ICD-10-CM | POA: Insufficient documentation

## 2014-07-07 DIAGNOSIS — M79605 Pain in left leg: Secondary | ICD-10-CM

## 2014-07-07 DIAGNOSIS — Z79899 Other long term (current) drug therapy: Secondary | ICD-10-CM | POA: Diagnosis not present

## 2014-07-07 DIAGNOSIS — Z7982 Long term (current) use of aspirin: Secondary | ICD-10-CM | POA: Insufficient documentation

## 2014-07-07 DIAGNOSIS — M79609 Pain in unspecified limb: Secondary | ICD-10-CM | POA: Diagnosis present

## 2014-07-07 DIAGNOSIS — K219 Gastro-esophageal reflux disease without esophagitis: Secondary | ICD-10-CM | POA: Diagnosis not present

## 2014-07-07 MED ORDER — DIAZEPAM 2 MG PO TABS
2.0000 mg | ORAL_TABLET | Freq: Once | ORAL | Status: AC
  Administered 2014-07-07: 2 mg via ORAL
  Filled 2014-07-07: qty 1

## 2014-07-07 MED ORDER — OXYCODONE-ACETAMINOPHEN 5-325 MG PO TABS: 1.0000 | ORAL_TABLET | ORAL | Status: DC | PRN

## 2014-07-07 MED ORDER — OXYCODONE-ACETAMINOPHEN 5-325 MG PO TABS
1.0000 | ORAL_TABLET | Freq: Once | ORAL | Status: AC
  Administered 2014-07-07: 1 via ORAL
  Filled 2014-07-07: qty 1

## 2014-07-07 MED ORDER — DIAZEPAM 2 MG PO TABS: 2.0000 mg | ORAL_TABLET | ORAL | Status: DC | PRN

## 2014-07-07 NOTE — ED Notes (Signed)
Ultrasound at bedside

## 2014-07-07 NOTE — Progress Notes (Signed)
Left lower extremity venous duplex completed.  Left:  No evidence of DVT, superficial thrombosis, or Baker's cyst.  Right:  Negative for DVT in the common femoral vein.  

## 2014-07-07 NOTE — Discharge Instructions (Signed)
Followup with your orthopedist as we have discussed. Do not take the Klonopin if you are feeling drowsy from the other medications that were described for today

## 2014-07-07 NOTE — ED Provider Notes (Signed)
CSN: [id number]     Arrival date & time 07/07/14  4562 History   First MD Initiated Contact with Patient 07/07/14 [phone]     Chief Complaint  Patient presents with  . Leg Pain    left     (Consider location/radiation/quality/duration/timing/severity/associated sxs/prior Treatment) HPI Comments: Patient here complaining of worsening left hip pain. Hasn't seen for this multiple times and has had multiple imaging done including a recent MRI of his L-spine which showed moderate disc disease a long spinal stenosis. Denies any loss of bowel or bladder function. Was treated in the past Valium as well as Percocet with some brief. Recently also seen for neck pain. No recent fever or chills. No urinary symptoms. Symptoms are worse in the morning and improved with walking. No recent trauma. Scheduled to see an orthopedist in the future  Patient is a 80 y.o. male presenting with leg pain. The history is provided by the patient.  Leg Pain   Past Medical History  Diagnosis Date  . Hypertension   . Anxiety   . Kidney stone   . GERD (gastroesophageal reflux disease)     no treatment   Past Surgical History  Procedure Laterality Date  . Tonsillectomy    . Addnoid    . Knee surgery    . Cystoscopy w/ ureteroscopy  1980   Family History  Problem Relation Age of Onset  . Heart failure Father   . Heart disease Father    History  Substance Use Topics  . Smoking status: Former Smoker -- 1.50 packs/day for 20 years    Quit date: 03/13/1956  . Smokeless tobacco: Never Used  . Alcohol Use: Yes    Review of Systems  All other systems reviewed and are negative.     Allergies  Review of patient's allergies indicates no known allergies.  Home Medications   Prior to Admission medications   Medication Sig Start Date End Date Taking? Authorizing Provider  acetaminophen (TYLENOL) 500 MG tablet Take 1,000 mg by mouth daily with breakfast.    Historical Provider, MD  aspirin EC 81 MG tablet Take  162 mg by mouth every morning.     Historical Provider, MD  atorvastatin (LIPITOR) 40 MG tablet Take 40 mg by mouth every morning.     Historical Provider, MD  clonazePAM (KLONOPIN) 0.5 MG tablet Take 1 mg by mouth at bedtime.     Historical Provider, MD  clotrimazole (LOTRIMIN) 1 % cream Apply 1 application topically daily as needed (for infection of penis).     Historical Provider, MD  diphenhydramine-acetaminophen (TYLENOL PM) 25-500 MG TABS Take 1 tablet by mouth at bedtime as needed (for sleep).    Historical Provider, MD  omeprazole (PRILOSEC) 20 MG capsule Take 20 mg by mouth 2 (two) times daily at 10 AM and 5 PM.     Historical Provider, MD  traMADol (ULTRAM) 50 MG tablet Take 1 tablet (50 mg total) by mouth every 6 (six) hours as needed. 06/13/14   Pamella Pert, MD  Triamcinolone Acetonide (KENALOG IJ) Inject as directed. Got in shoulder at dr's office    Historical Provider, MD   BP 149/66  Pulse 91  Temp(Src) 98.6 F (37 C) (Oral)  Resp 16  Wt 171 lb (77.565 kg)  SpO2 99% Physical Exam  Nursing note and vitals reviewed. Constitutional: He is oriented to person, place, and time. He appears well-developed and well-nourished.  Non-toxic appearance. No distress.  HENT:  Head: Normocephalic and atraumatic.  Eyes: Conjunctivae, EOM and lids are normal. Pupils are equal, round, and reactive to light.  Neck: Normal range of motion. Neck supple. No tracheal deviation present. No mass present.  Cardiovascular: Normal rate, regular rhythm and normal heart sounds.  Exam reveals no gallop.   No murmur heard. Pulmonary/Chest: Effort normal and breath sounds normal. No stridor. No respiratory distress. He has no decreased breath sounds. He has no wheezes. He has no rhonchi. He has no rales.  Abdominal: Soft. Normal appearance and bowel sounds are normal. He exhibits no distension. There is no tenderness. There is no rebound and no CVA tenderness.  Musculoskeletal: Normal range of motion. He  exhibits no edema and no tenderness.  2+ lower extremity edema, nontender along the cervical thoracic and lumbar spine  Neurological: He is alert and oriented to person, place, and time. He has normal strength. No cranial nerve deficit or sensory deficit. GCS eye subscore is 4. GCS verbal subscore is 5. GCS motor subscore is 6.  Reflex Scores:      Patellar reflexes are 3+ on the right side and 3+ on the left side. Skin: Skin is warm and dry. No abrasion and no rash noted.  Psychiatric: He has a normal mood and affect. His speech is normal and behavior is normal.    ED Course  Procedures (including critical care time) Labs Review Labs Reviewed - No data to display  Imaging Review No results found.   EKG Interpretation None      MDM   Final diagnoses:  None    Pt given meds and feels better--doppler negative    Leota Jacobsen, MD 07/07/14 1047

## 2014-07-07 NOTE — ED Notes (Signed)
Pt c/o left hip and leg pain for months now.  Pt states that he had spasms and intense pain during the night last night and couldn't get good sleep so that's what brought him in here.

## 2014-07-16 ENCOUNTER — Ambulatory Visit
Admission: RE | Admit: 2014-07-16 | Discharge: 2014-07-16 | Disposition: A | Discharge status: Home / Self Care | Payer: Medicare Other | Source: Ambulatory Visit | Attending: Internal Medicine | Admitting: Internal Medicine

## 2014-07-16 ENCOUNTER — Other Ambulatory Visit: Payer: Self-pay | Admitting: Internal Medicine

## 2014-07-16 DIAGNOSIS — R350 Frequency of micturition: Secondary | ICD-10-CM

## 2014-08-10 ENCOUNTER — Other Ambulatory Visit: Payer: Self-pay | Admitting: Internal Medicine

## 2014-08-10 ENCOUNTER — Ambulatory Visit
Admission: RE | Admit: 2014-08-10 | Discharge: 2014-08-10 | Disposition: A | Discharge status: Home / Self Care | Payer: Medicare Other | Source: Ambulatory Visit | Attending: Internal Medicine | Admitting: Internal Medicine

## 2014-08-10 DIAGNOSIS — M7989 Other specified soft tissue disorders: Secondary | ICD-10-CM

## 2014-12-30 ENCOUNTER — Other Ambulatory Visit (HOSPITAL_COMMUNITY): Payer: Self-pay | Admitting: Internal Medicine

## 2014-12-30 DIAGNOSIS — I4891 Unspecified atrial fibrillation: Secondary | ICD-10-CM

## 2014-12-30 DIAGNOSIS — I4892 Unspecified atrial flutter: Secondary | ICD-10-CM

## 2015-01-01 ENCOUNTER — Ambulatory Visit (HOSPITAL_COMMUNITY)
Admission: RE | Admit: 2015-01-01 | Discharge: 2015-01-01 | Disposition: A | Discharge status: Home / Self Care | Payer: Medicare Other | Source: Ambulatory Visit | Attending: Internal Medicine | Admitting: Internal Medicine

## 2015-01-01 DIAGNOSIS — I4892 Unspecified atrial flutter: Secondary | ICD-10-CM | POA: Diagnosis present

## 2015-01-01 DIAGNOSIS — I4891 Unspecified atrial fibrillation: Secondary | ICD-10-CM

## 2015-01-01 DIAGNOSIS — Z720 Tobacco use: Secondary | ICD-10-CM | POA: Diagnosis not present

## 2015-01-26 ENCOUNTER — Emergency Department (HOSPITAL_COMMUNITY): Payer: Medicare Other

## 2015-01-26 ENCOUNTER — Encounter (HOSPITAL_COMMUNITY): Payer: Self-pay

## 2015-01-26 ENCOUNTER — Emergency Department (HOSPITAL_COMMUNITY)
Admission: EM | Admit: 2015-01-26 | Discharge: 2015-01-26 | Disposition: A | Discharge status: Home / Self Care | Payer: Medicare Other | Attending: Emergency Medicine | Admitting: Emergency Medicine

## 2015-01-26 DIAGNOSIS — K219 Gastro-esophageal reflux disease without esophagitis: Secondary | ICD-10-CM | POA: Insufficient documentation

## 2015-01-26 DIAGNOSIS — Z7982 Long term (current) use of aspirin: Secondary | ICD-10-CM | POA: Diagnosis not present

## 2015-01-26 DIAGNOSIS — M2540 Effusion, unspecified joint: Secondary | ICD-10-CM | POA: Diagnosis present

## 2015-01-26 DIAGNOSIS — I10 Essential (primary) hypertension: Secondary | ICD-10-CM | POA: Diagnosis not present

## 2015-01-26 DIAGNOSIS — Z87891 Personal history of nicotine dependence: Secondary | ICD-10-CM | POA: Diagnosis not present

## 2015-01-26 DIAGNOSIS — F419 Anxiety disorder, unspecified: Secondary | ICD-10-CM | POA: Diagnosis not present

## 2015-01-26 DIAGNOSIS — Z87442 Personal history of urinary calculi: Secondary | ICD-10-CM | POA: Diagnosis not present

## 2015-01-26 DIAGNOSIS — R600 Localized edema: Secondary | ICD-10-CM | POA: Diagnosis not present

## 2015-01-26 DIAGNOSIS — R609 Edema, unspecified: Secondary | ICD-10-CM

## 2015-01-26 DIAGNOSIS — Z79899 Other long term (current) drug therapy: Secondary | ICD-10-CM | POA: Diagnosis not present

## 2015-01-26 HISTORY — DX: Unspecified hearing loss, unspecified ear: H91.90

## 2015-01-26 LAB — CBC WITH DIFFERENTIAL/PLATELET
BASOS ABS: 0.1 10*3/uL (ref 0.0–0.1)
Basophils Relative: 1 % (ref 0–1)
Eosinophils Absolute: 0.1 10*3/uL (ref 0.0–0.7)
Eosinophils Relative: 1 % (ref 0–5)
HCT: 42.2 % (ref 39.0–52.0)
Hemoglobin: 13.6 g/dL (ref 13.0–17.0)
Lymphocytes Relative: 11 % — ABNORMAL LOW (ref 12–46)
Lymphs Abs: 0.8 10*3/uL (ref 0.7–4.0)
MCH: 30.5 pg (ref 26.0–34.0)
MCHC: 32.2 g/dL (ref 30.0–36.0)
MCV: 94.6 fL (ref 78.0–100.0)
Monocytes Absolute: 0.9 10*3/uL (ref 0.1–1.0)
Monocytes Relative: 11 % (ref 3–12)
NEUTROS ABS: 6.1 10*3/uL (ref 1.7–7.7)
NEUTROS PCT: 76 % (ref 43–77)
Platelets: 188 10*3/uL (ref 150–400)
RBC: 4.46 MIL/uL (ref 4.22–5.81)
RDW: 13.4 % (ref 11.5–15.5)
WBC: 8 10*3/uL (ref 4.0–10.5)

## 2015-01-26 LAB — BASIC METABOLIC PANEL
ANION GAP: 7 (ref 5–15)
BUN: 13 mg/dL (ref 6–23)
CHLORIDE: 106 mmol/L (ref 96–112)
CO2: 23 mmol/L (ref 19–32)
Calcium: 8.9 mg/dL (ref 8.4–10.5)
Creatinine, Ser: 0.8 mg/dL (ref 0.50–1.35)
GFR calc non Af Amer: 79 mL/min — ABNORMAL LOW (ref >90)
Glucose, Bld: 125 mg/dL — ABNORMAL HIGH (ref 70–99)
POTASSIUM: 4.1 mmol/L (ref 3.5–5.1)
Sodium: 136 mmol/L (ref 135–145)

## 2015-01-26 LAB — BRAIN NATRIURETIC PEPTIDE: B Natriuretic Peptide: 64.1 pg/mL (ref 0.0–100.0)

## 2015-01-26 MED ORDER — FUROSEMIDE 20 MG PO TABS: 20.0000 mg | ORAL_TABLET | Freq: Every day | ORAL | Status: DC

## 2015-01-26 NOTE — Discharge Instructions (Signed)
It is recommended that you wear compression stockings.  That will help with your swelling.  It is also recommended that you start taking the Lasix you were prescribed.

## 2015-01-26 NOTE — ED Provider Notes (Signed)
CSN: [id number]     Arrival date & time 01/26/15  0827 History   First MD Initiated Contact with Patient 01/26/15 [phone]     Chief Complaint  Patient presents with  . Joint Swelling     (Consider location/radiation/quality/duration/timing/severity/associated sxs/prior Treatment) HPI Comments: Patient presents today with a chief complaint of bilateral LE edema.  He states that he has had edema since 2015.  However, he reports that the swelling has worsened over the past 2 weeks.  He was seen by his PCP in February and had a 2 D Echo done on 01/01/15, which showed an EF of 55-60%.  He denies any injury or trauma.  Denies LE pain or erythema.  Denies SOB, chest pain, fever, or chills.  He is currently not on a diuretic.  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  He denies prior history of DVT or PE.  No treatment prior to arrival.    The history is provided by the patient.    Past Medical History  Diagnosis Date  . Hypertension   . Anxiety   . Kidney stone   . GERD (gastroesophageal reflux disease)     no treatment  . Hard of hearing    Past Surgical History  Procedure Laterality Date  . Tonsillectomy    . Addnoid    . Knee surgery    . Cystoscopy w/ ureteroscopy  1980   Family History  Problem Relation Age of Onset  . Heart failure Father   . Heart disease Father    History  Substance Use Topics  . Smoking status: Former Smoker -- 1.50 packs/day for 20 years    Quit date: 03/13/1956  . Smokeless tobacco: Never Used  . Alcohol Use: Yes    Review of Systems  All other systems reviewed and are negative.     Allergies  Review of patient's allergies indicates no known allergies.  Home Medications   Prior to Admission medications   Medication Sig Start Date End Date Taking? Authorizing Provider  acetaminophen (TYLENOL) 500 MG tablet Take 1,000 mg by mouth daily with breakfast.    Historical Provider,  MD  aspirin EC 81 MG tablet Take 162 mg by mouth every morning.     Historical Provider, MD  atorvastatin (LIPITOR) 40 MG tablet Take 40 mg by mouth every morning.     Historical Provider, MD  clonazePAM (KLONOPIN) 0.5 MG tablet Take 1 mg by mouth at bedtime.     Historical Provider, MD  clotrimazole (LOTRIMIN) 1 % cream Apply 1 application topically daily as needed (for infection of penis).     Historical Provider, MD  diazepam (VALIUM) 2 MG tablet Take 1 tablet (2 mg total) by mouth every 4 (four) hours as needed for muscle spasms. 07/07/14   Lacretia Leigh, MD  diphenhydramine-acetaminophen (TYLENOL PM) 25-500 MG TABS Take 1 tablet by mouth at bedtime as needed (for sleep).    Historical Provider, MD  omeprazole (PRILOSEC) 20 MG capsule Take 20 mg by mouth 2 (two) times daily at 10 AM and 5 PM.     Historical Provider, MD  oxyCODONE-acetaminophen (PERCOCET/ROXICET) 5-325 MG per tablet Take 1-2 tablets by mouth every 4 (four) hours as needed for severe pain. 07/07/14   Lacretia Leigh, MD  Triamcinolone Acetonide (KENALOG IJ) Inject as directed. Got in shoulder at dr's office    Historical Provider, MD   BP 149/74  mmHg  Pulse 64  Temp(Src) 98 F (36.7 C) (Oral)  Resp 18  SpO2 98% Physical Exam  Constitutional: He appears well-developed and well-nourished.  HENT:  Head: Normocephalic and atraumatic.  Mouth/Throat: Oropharynx is clear and moist.  Neck: Normal range of motion. Neck supple.  Cardiovascular: Normal rate, regular rhythm and normal heart sounds.   Pulses:      Dorsalis pedis pulses are 2+ on the right side, and 2+ on the left side.  Pulmonary/Chest: Effort normal and breath sounds normal.  Musculoskeletal: Normal range of motion.  3+ pitting edema of the ankles bilaterally extending to the mid tib.  No erythema or warmth of the lower extremities.    Neurological: He is alert.  Skin: Skin is warm and dry.  Psychiatric: He has a normal mood and affect.  Nursing note and vitals  reviewed.   ED Course  Procedures (including critical care time) Labs Review Labs Reviewed - No data to display  Imaging Review Dg Chest 2 View  01/26/2015   CLINICAL DATA:  Bilateral lower extremity swelling today. Hypertension.  EXAM: CHEST  2 VIEW  COMPARISON:  04/23/2007  FINDINGS: There is cardiomegaly. No overt edema. No effusions. Calcified left hilar lymph nodes compatible with old granulomatous disease. No confluent opacities. No acute bony abnormality.  IMPRESSION: Cardiomegaly.  No overt failure.   Electronically Signed   By: Rolm Baptise M.D.   On: 01/26/2015 10:46     EKG Interpretation None      MDM   Final diagnoses:  None   Patient presents today with bilateral LE pitting edema.  Labs unremarkable including BNP.  CXR with cardiomegaly, but no overt signs of heart failure.  No erythema or warmth of the LE.  Swelling is symmetric and has been present since 2015.  Therefore, feel that DVT is unlikely.  Patient instructed to wear compression stockings and started on Lasix.  Instructed to follow up with PCP.  Patient verbalizes understanding of the plan.  Patient stable for discharge.  Return precautions given.    Hyman Bible, PA-C 01/26/15 [place], MD 01/26/15 1626

## 2015-01-26 NOTE — ED Provider Notes (Signed)
Patient presented to the ER for evaluation of lower extremity swelling. Patient reports that this is been present for some time, but in the last 1 or 2 weeks has worsened. He has not had any chest pain or shortness of breath associated with symptoms.  Face to face Exam: HEENT - PERRLA Lungs - CTAB Heart - RRR, no M/R/G Abd - S/NT/ND Neuro - alert, oriented x3 Musculoskeletal - 2+ bilateral pitting edema Skin - no erythema or warmth  Plan: Obtain basic labs including BNP and chest x-ray. This is likely peripheral edema. Patient recently had cardiac echo that was unremarkable. Will need short course of diuresis and follow-up with primary doctor.     Orpah Greek, MD 01/26/15 [phone]

## 2015-01-26 NOTE — ED Notes (Addendum)
Pt c/o increasing BLE swelling x 1 week.  Pain score 3/10.  Hx of HTN.  Pt reports recently started on Timolol eye drops, due to eye pressure.  Swelling noted.  Pt is hard of hearing.

## 2015-01-26 NOTE — ED Notes (Signed)
Patient transported to X-ray 

## 2015-02-01 ENCOUNTER — Encounter (HOSPITAL_COMMUNITY): Payer: Self-pay | Admitting: Emergency Medicine

## 2015-02-01 ENCOUNTER — Emergency Department (HOSPITAL_COMMUNITY)
Admission: EM | Admit: 2015-02-01 | Discharge: 2015-02-01 | Disposition: A | Discharge status: Home / Self Care | Payer: Medicare Other | Attending: Emergency Medicine | Admitting: Emergency Medicine

## 2015-02-01 DIAGNOSIS — K219 Gastro-esophageal reflux disease without esophagitis: Secondary | ICD-10-CM | POA: Insufficient documentation

## 2015-02-01 DIAGNOSIS — Z87891 Personal history of nicotine dependence: Secondary | ICD-10-CM | POA: Insufficient documentation

## 2015-02-01 DIAGNOSIS — Z7952 Long term (current) use of systemic steroids: Secondary | ICD-10-CM | POA: Diagnosis not present

## 2015-02-01 DIAGNOSIS — Z7982 Long term (current) use of aspirin: Secondary | ICD-10-CM | POA: Insufficient documentation

## 2015-02-01 DIAGNOSIS — R600 Localized edema: Secondary | ICD-10-CM | POA: Diagnosis not present

## 2015-02-01 DIAGNOSIS — Z7902 Long term (current) use of antithrombotics/antiplatelets: Secondary | ICD-10-CM | POA: Diagnosis not present

## 2015-02-01 DIAGNOSIS — Z87442 Personal history of urinary calculi: Secondary | ICD-10-CM | POA: Insufficient documentation

## 2015-02-01 DIAGNOSIS — M436 Torticollis: Secondary | ICD-10-CM | POA: Diagnosis present

## 2015-02-01 DIAGNOSIS — Z79899 Other long term (current) drug therapy: Secondary | ICD-10-CM | POA: Diagnosis not present

## 2015-02-01 DIAGNOSIS — F419 Anxiety disorder, unspecified: Secondary | ICD-10-CM | POA: Diagnosis not present

## 2015-02-01 DIAGNOSIS — M542 Cervicalgia: Secondary | ICD-10-CM | POA: Insufficient documentation

## 2015-02-01 DIAGNOSIS — H9190 Unspecified hearing loss, unspecified ear: Secondary | ICD-10-CM | POA: Diagnosis not present

## 2015-02-01 DIAGNOSIS — I10 Essential (primary) hypertension: Secondary | ICD-10-CM | POA: Insufficient documentation

## 2015-02-01 MED ORDER — OXYCODONE-ACETAMINOPHEN 5-325 MG PO TABS
1.0000 | ORAL_TABLET | Freq: Once | ORAL | Status: AC
  Administered 2015-02-01: 1 via ORAL
  Filled 2015-02-01: qty 1

## 2015-02-01 MED ORDER — CYCLOBENZAPRINE HCL 10 MG PO TABS: 10.0000 mg | ORAL_TABLET | Freq: Three times a day (TID) | ORAL | Status: DC | PRN

## 2015-02-01 MED ORDER — OXYCODONE-ACETAMINOPHEN 5-325 MG PO TABS: 1.0000 | ORAL_TABLET | Freq: Four times a day (QID) | ORAL | Status: DC | PRN

## 2015-02-01 NOTE — Discharge Instructions (Signed)
Cervical Sprain A cervical sprain is when the tissues (ligaments) that hold the neck bones in place stretch or tear. HOME CARE   Put ice on the injured area.  Put ice in a plastic bag.  Place a towel between your skin and the bag.  Leave the ice on for 15-20 minutes, 3-4 times a day.  You may have been given a collar to wear. This collar keeps your neck from moving while you heal.  Do not take the collar off unless told by your doctor.  If you have long hair, keep it outside of the collar.  Ask your doctor before changing the position of your collar. You may need to change its position over time to make it more comfortable.  If you are allowed to take off the collar for cleaning or bathing, follow your doctor's instructions on how to do it safely.  Keep your collar clean by wiping it with mild soap and water. Dry it completely. If the collar has removable pads, remove them every 1-2 days to hand wash them with soap and water. Allow them to air dry. They should be dry before you wear them in the collar.  Do not drive while wearing the collar.  Only take medicine as told by your doctor.  Keep all doctor visits as told.  Keep all physical therapy visits as told.  Adjust your work station so that you have good posture while you work.  Avoid positions and activities that make your problems worse.  Warm up and stretch before being active. GET HELP IF:  Your pain is not controlled with medicine.  You cannot take less pain medicine over time as planned.  Your activity level does not improve as expected. GET HELP RIGHT AWAY IF:   You are bleeding.  Your stomach is upset.  You have an allergic reaction to your medicine.  You develop new problems that you cannot explain.  You lose feeling (become numb) or you cannot move any part of your body (paralysis).  You have tingling or weakness in any part of your body.  Your symptoms get worse. Symptoms include:  Pain,  soreness, stiffness, puffiness (swelling), or a burning feeling in your neck.  Pain when your neck is touched.  Shoulder or upper back pain.  Limited ability to move your neck.  Headache.  Dizziness.  Your hands or arms feel week, lose feeling, or tingle.  Muscle spasms.  Difficulty swallowing or chewing. MAKE SURE YOU:   Understand these instructions.  Will watch your condition.  Will get help right away if you are not doing well or get worse. Document Released: 04/17/2008 Document Revised: 07/02/2013 Document Reviewed: 05/07/2013 Exit[employer] Patient Information 2015 [employer]. This information is not intended to replace advice given to you by your health care provider. Make sure you discuss any questions you have with your health care provider.  

## 2015-02-01 NOTE — ED Notes (Addendum)
Pt c/o neck stiffness since Friday. Pt states the pain is normally better during the day but last night the pain was worse then it has been.  Pt states the pain is throbbing. Pt denies any injury that could have caused the pain. Pt states that he went to the dentist on Thursday and didn't like the position they had him in while in the dental chair.

## 2015-02-01 NOTE — ED Provider Notes (Signed)
CSN: [id number]     Arrival date & time 02/01/15  0759 History   First MD Initiated Contact with Patient 02/01/15 [phone]     Chief Complaint  Patient presents with  . Torticollis     (Consider location/radiation/quality/duration/timing/severity/associated sxs/prior Treatment) Patient is a 81 y.o. male presenting with neck injury. The history is provided by the patient.  Neck Injury This is a new problem. Episode onset: 3 days ago. The problem occurs constantly. The problem has been gradually worsening. Pertinent negatives include no chest pain, no abdominal pain, no headaches and no shortness of breath. Nothing aggravates the symptoms. Nothing relieves the symptoms. He has tried nothing for the symptoms. The treatment provided no relief.    Past Medical History  Diagnosis Date  . Hypertension   . Anxiety   . Kidney stone   . GERD (gastroesophageal reflux disease)     no treatment  . Hard of hearing    Past Surgical History  Procedure Laterality Date  . Tonsillectomy    . Addnoid    . Knee surgery    . Cystoscopy w/ ureteroscopy  1980   Family History  Problem Relation Age of Onset  . Heart failure Father   . Heart disease Father    History  Substance Use Topics  . Smoking status: Former Smoker -- 1.50 packs/day for 20 years    Quit date: 03/13/1956  . Smokeless tobacco: Never Used  . Alcohol Use: Yes    Review of Systems  Constitutional: Negative for fever.  HENT: Negative for drooling and rhinorrhea.   Eyes: Negative for pain.  Respiratory: Negative for cough and shortness of breath.   Cardiovascular: Negative for chest pain and leg swelling.  Gastrointestinal: Negative for nausea, vomiting, abdominal pain and diarrhea.  Genitourinary: Negative for dysuria and hematuria.  Musculoskeletal: Positive for neck pain. Negative for gait problem.  Skin: Negative for color change.  Neurological: Negative for numbness and headaches.  Hematological: Negative for adenopathy.   Psychiatric/Behavioral: Negative for behavioral problems.  All other systems reviewed and are negative.     Allergies  Review of patient's allergies indicates no known allergies.  Home Medications   Prior to Admission medications   Medication Sig Start Date End Date Taking? Authorizing Provider  acetaminophen (TYLENOL) 500 MG tablet Take 1,000 mg by mouth daily with breakfast.    Historical Provider, MD  aspirin EC 81 MG tablet Take 162 mg by mouth every morning.     Historical Provider, MD  atorvastatin (LIPITOR) 10 MG tablet Take 10 mg by mouth daily. 11/16/14   Historical Provider, MD  atorvastatin (LIPITOR) 40 MG tablet Take 40 mg by mouth every morning.     Historical Provider, MD  clonazePAM (KLONOPIN) 0.5 MG tablet Take 1 mg by mouth at bedtime.     Historical Provider, MD  clotrimazole (LOTRIMIN) 1 % cream Apply 1 application topically daily as needed (for infection of penis).     Historical Provider, MD  diazepam (VALIUM) 2 MG tablet Take 1 tablet (2 mg total) by mouth every 4 (four) hours as needed for muscle spasms. Patient not taking: Reported on 01/26/2015 07/07/14   Lacretia Leigh, MD  diltiazem (CARDIZEM CD) 120 MG 24 hr capsule Take 120 mg by mouth daily. 01/21/15   Historical Provider, MD  diphenhydramine-acetaminophen (TYLENOL PM) 25-500 MG TABS Take 1 tablet by mouth at bedtime as needed (for sleep).    Historical Provider, MD  ELIQUIS 5 MG TABS tablet Take 5 mg by  mouth 2 (two) times daily. 01/21/15   Historical Provider, MD  furosemide (LASIX) 20 MG tablet Take 1 tablet (20 mg total) by mouth daily. 01/26/15   Heather Laisure, PA-C  omeprazole (PRILOSEC) 20 MG capsule Take 20 mg by mouth 2 (two) times daily at 10 AM and 5 PM.     Historical Provider, MD  oxyCODONE-acetaminophen (PERCOCET/ROXICET) 5-325 MG per tablet Take 1-2 tablets by mouth every 4 (four) hours as needed for severe pain. Patient not taking: Reported on 01/26/2015 07/07/14   Lacretia Leigh, MD  predniSONE  (DELTASONE) 5 MG tablet Take 5 mg by mouth daily. 01/18/15   Historical Provider, MD  ROZEREM 8 MG tablet Take 1 tablet by mouth daily. 12/28/14   Historical Provider, MD  tamsulosin (FLOMAX) 0.4 MG CAPS capsule Take 0.4 mg by mouth daily. 01/10/15   Historical Provider, MD  timolol (TIMOPTIC) 0.5 % ophthalmic solution Place 1 drop into the right eye every morning. 01/21/15   Historical Provider, MD   BP 138/57 mmHg  Pulse 84  Temp(Src) 97.9 F (36.6 C) (Oral)  Resp 18  SpO2 97% Physical Exam  Constitutional: He is oriented to person, place, and time. He appears well-developed and well-nourished.  HENT:  Head: Normocephalic and atraumatic.  Right Ear: External ear normal.  Left Ear: External ear normal.  Nose: Nose normal.  Mouth/Throat: Oropharynx is clear and moist. No oropharyngeal exudate.  Eyes: Conjunctivae and EOM are normal. Pupils are equal, round, and reactive to light.  Neck: Normal range of motion. Neck supple.  Cardiovascular: Normal rate, regular rhythm, normal heart sounds and intact distal pulses.  Exam reveals no gallop and no friction rub.   No murmur heard. Pulmonary/Chest: Effort normal and breath sounds normal. No respiratory distress. He has no wheezes.  Abdominal: Soft. Bowel sounds are normal. He exhibits no distension. There is no tenderness. There is no rebound and no guarding.  Musculoskeletal: Normal range of motion. He exhibits edema (mild pitting edema in distal lower extremities.). He exhibits no tenderness.  Mild to moderate limited range of motion of the neck to the left and to the right. Reproduction of neck pain with turning of the head to the left or right.  No carotid bruits auscultated.  No obvious focal tenderness of the neck.  No vertebral tenderness noted of the spine.  Neurological: He is alert and oriented to person, place, and time.  Normal strength and sensation in upper and lower extremities.  2+ distal pulses in upper and lower extremity.   Skin: Skin is warm and dry.  Psychiatric: He has a normal mood and affect. His behavior is normal.  Nursing note and vitals reviewed.   ED Course  Procedures (including critical care time) Labs Review Labs Reviewed - No data to display  Imaging Review No results found.   EKG Interpretation None      MDM   Final diagnoses:  Neck pain    8:39 AM 81 y.o. male w hx of HTN, afib on eliquis who presents with neck pain which began 3 days ago. He states that he had a dental appointment 4 days ago and his neck was uncomfortable during his teeth cleaning. He has had continued neck pain despite rest over the last few days. He denies any weakness, numbness, chest pain, or shortness of breath. He was evaluated last week for lower extremity edema which has improved significantly since his last visit. I reviewed his workup then which was noncontributory. His pain is reproducible with  range of motion of the neck on my exam. I suspect cervical strain and will recommend a course of pain control and follow-up with his orthopedist/PCP as needed. VS unremarkable.  8:40 AM:  I have discussed the diagnosis/risks/treatment options with the patient and family and believe the pt to be eligible for discharge home to follow-up with his pcp/ortho if sx continue. We also discussed returning to the ED immediately if new or worsening sx occur. We discussed the sx which are most concerning (e.g., worsening pain, fever, weakness, numbness) that necessitate immediate return. Medications administered to the patient during their visit and any new prescriptions provided to the patient are listed below.  Medications given during this visit Medications  oxyCODONE-acetaminophen (PERCOCET/ROXICET) 5-325 MG per tablet 1 tablet (1 tablet Oral Given 02/01/15 0855)    New Prescriptions   No medications on file       Pamella Pert, MD 02/01/15 [phone]

## 2015-02-22 ENCOUNTER — Other Ambulatory Visit: Payer: Self-pay | Admitting: Dermatology

## 2016-03-19 IMAGING — CR DG HIP (WITH OR WITHOUT PELVIS) 2-3V*L*
3 series · 3 of 3 positions shown · non-contrast
Comparison: 04/23/2014

CLINICAL DATA: Lateral left hip pain

EXAM:
LEFT HIP - COMPLETE 2+ VIEW

[t pelvis ap]
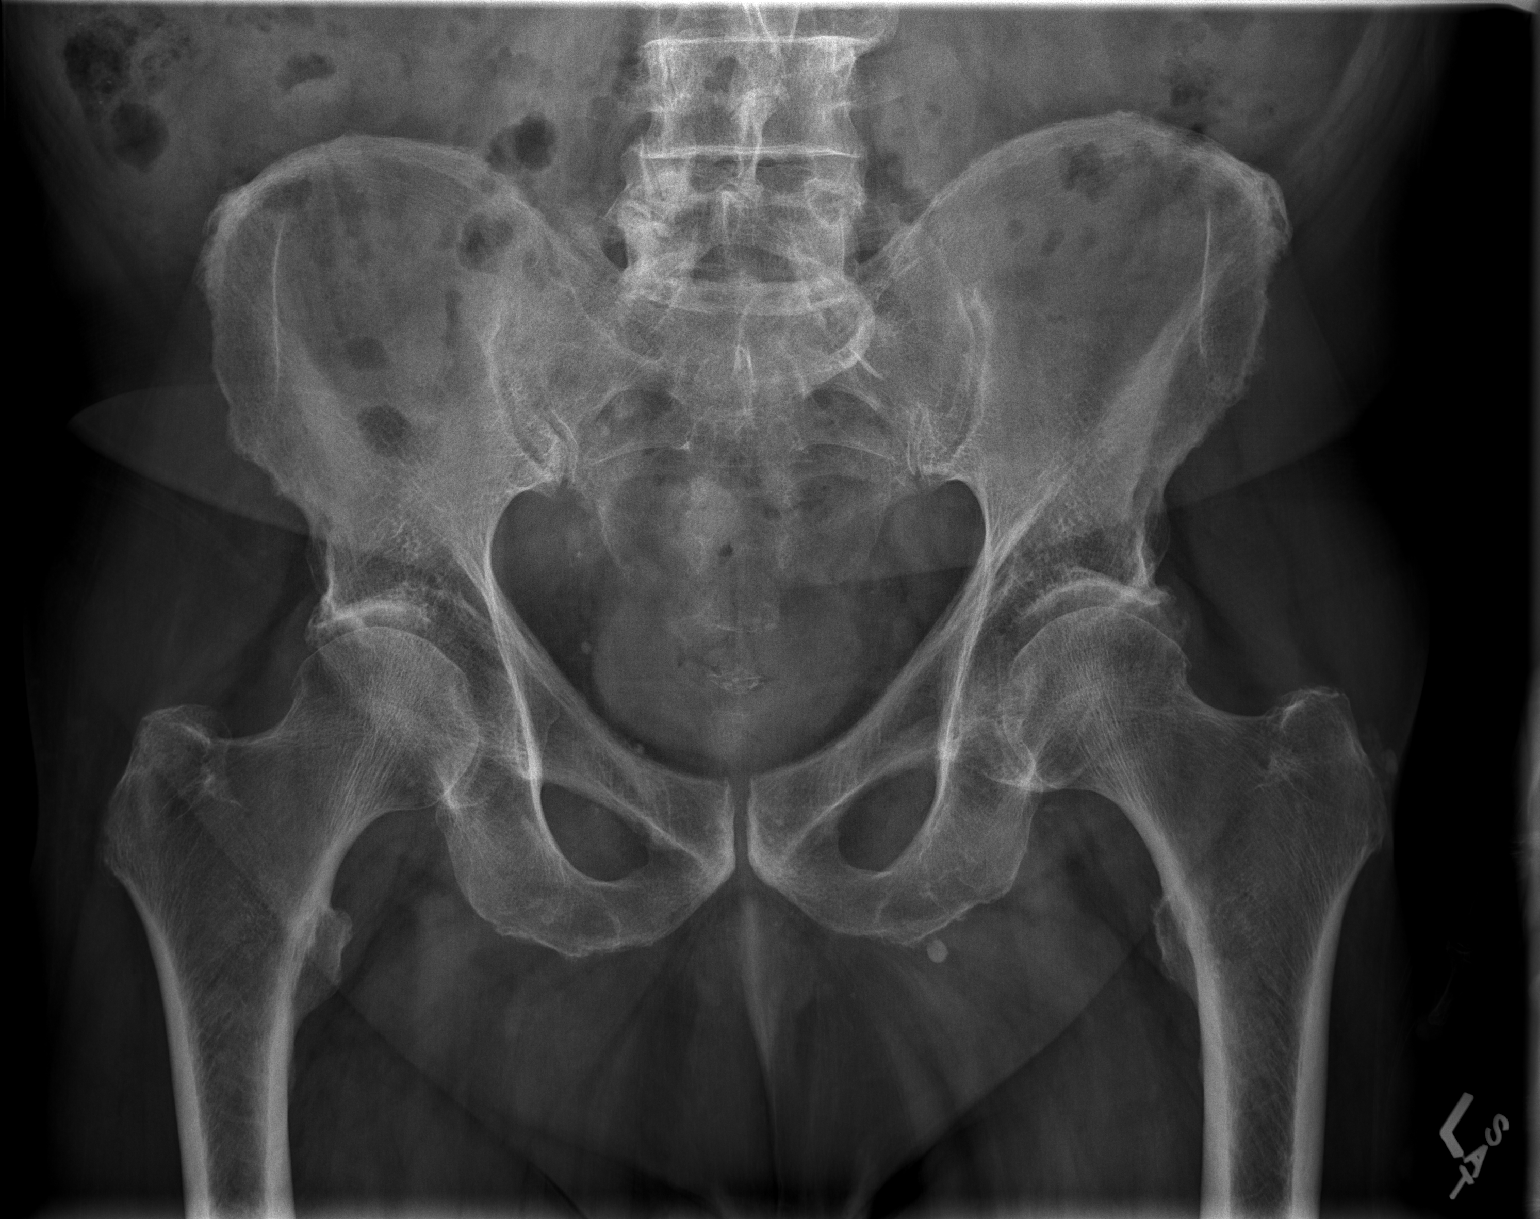

[t hip ap left]
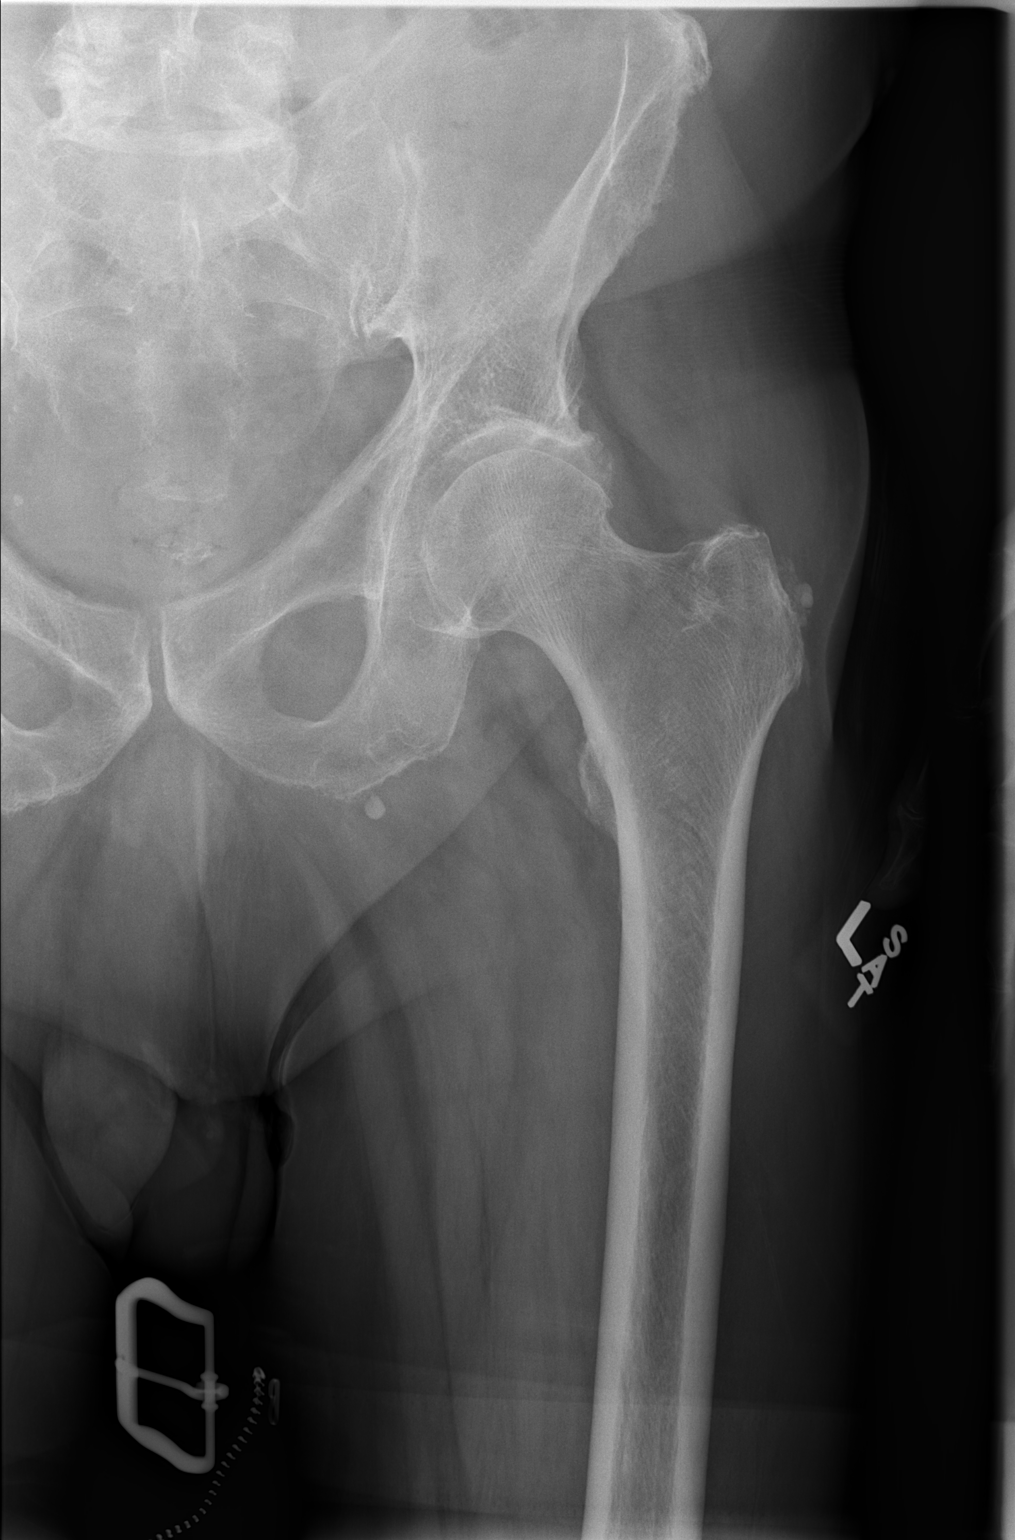

[t hip frog leg left]
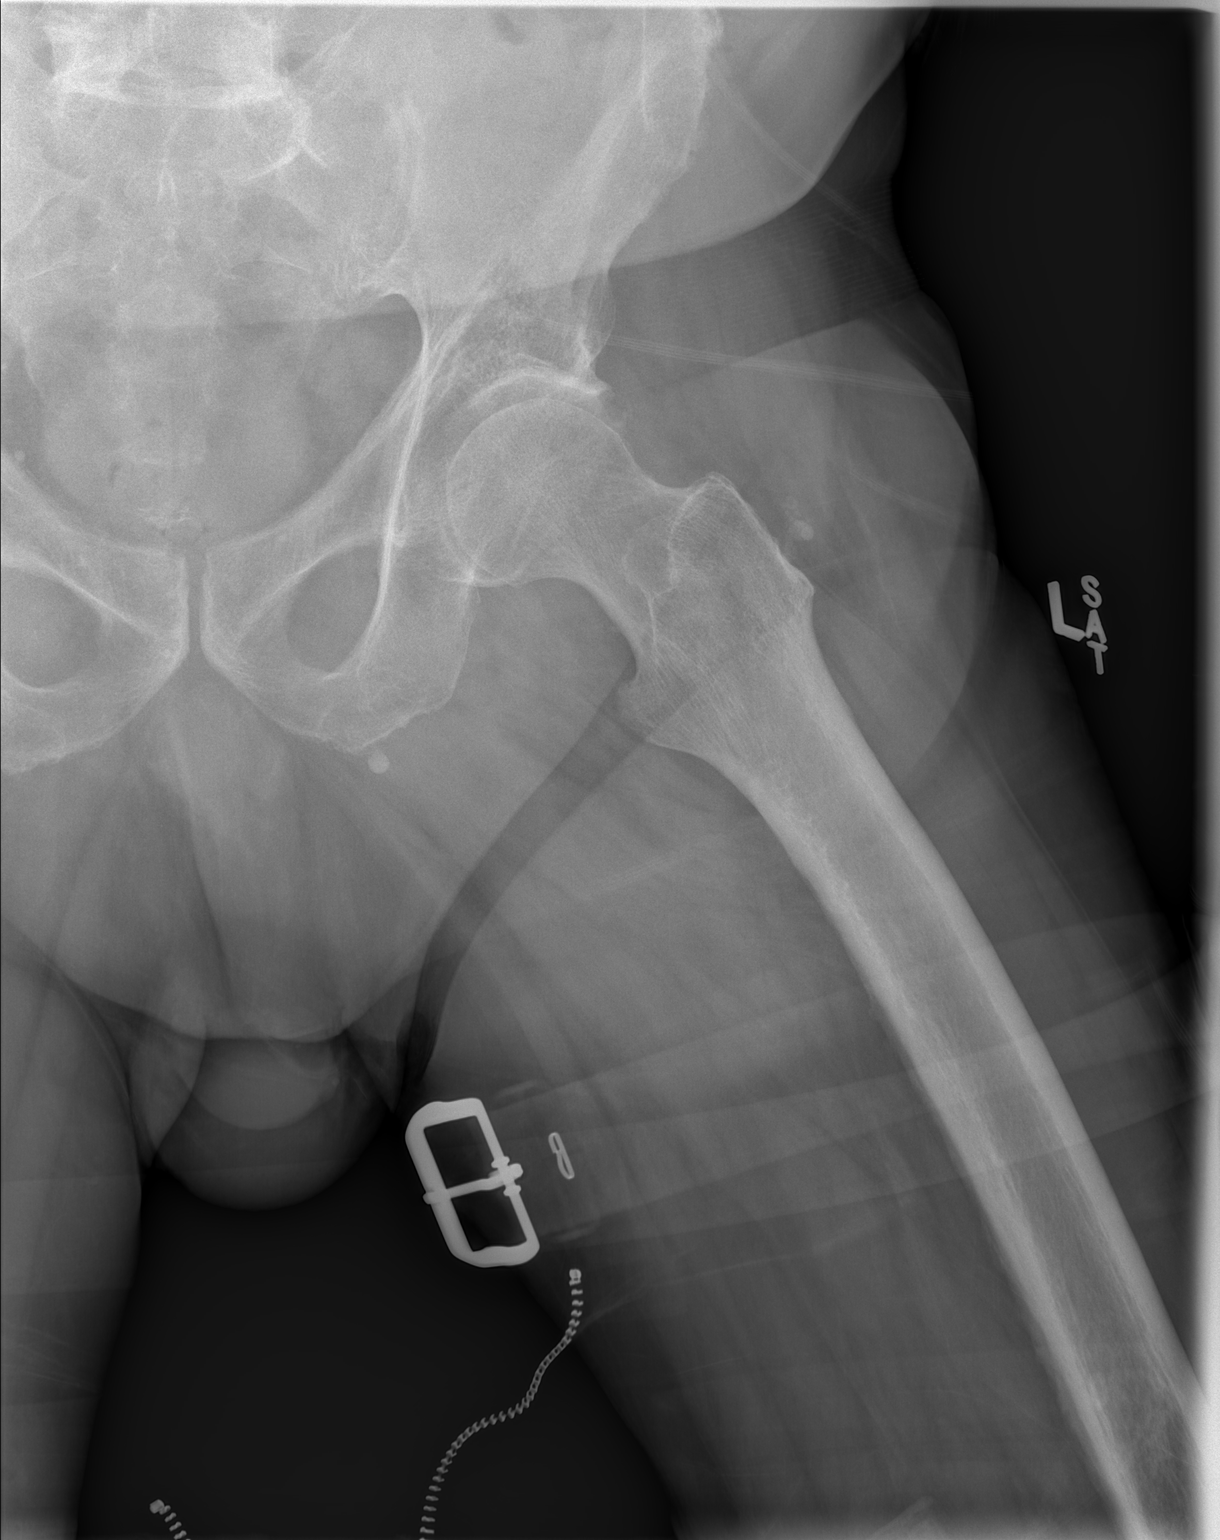

[3 of 3 positions shown; findings below may reference images not displayed]

FINDINGS: There is no evidence of hip fracture or dislocation. There is no
evidence of arthropathy or other focal bone abnormality.
IMPRESSION: Negative.

## 2016-03-19 IMAGING — CR DG SHOULDER 2+V*R*
3 series · 3 of 3 positions shown · non-contrast
Comparison: Chest radiograph 04/23/2007

CLINICAL DATA: Right shoulder pain radiating down the right arm for
a few weeks.

EXAM:
RIGHT SHOULDER - 2+ VIEW

[w shoulder internal right]
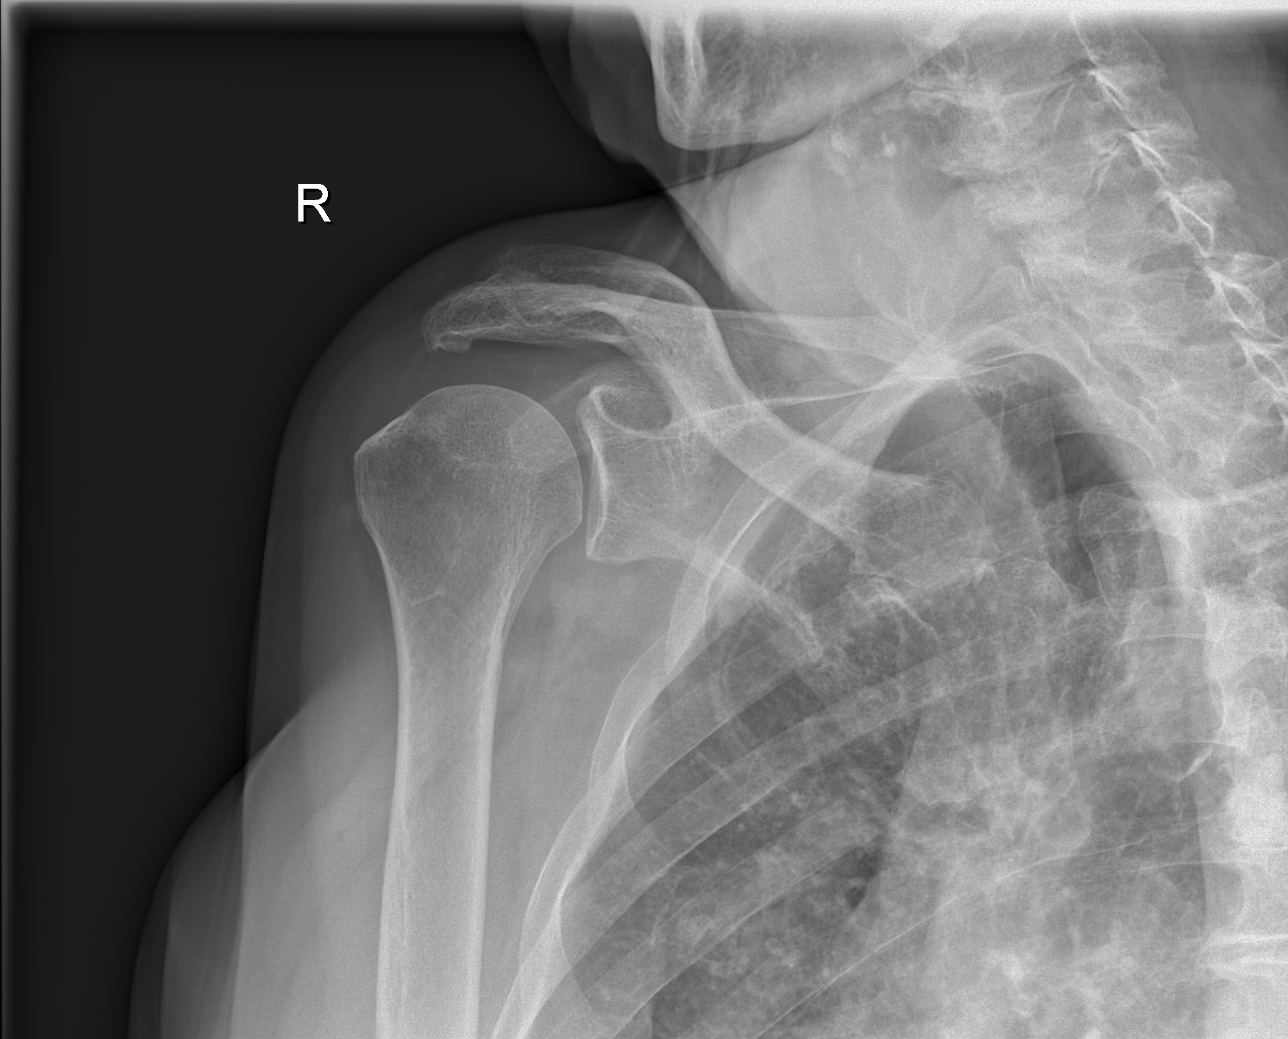

[w shoulder y-view right]
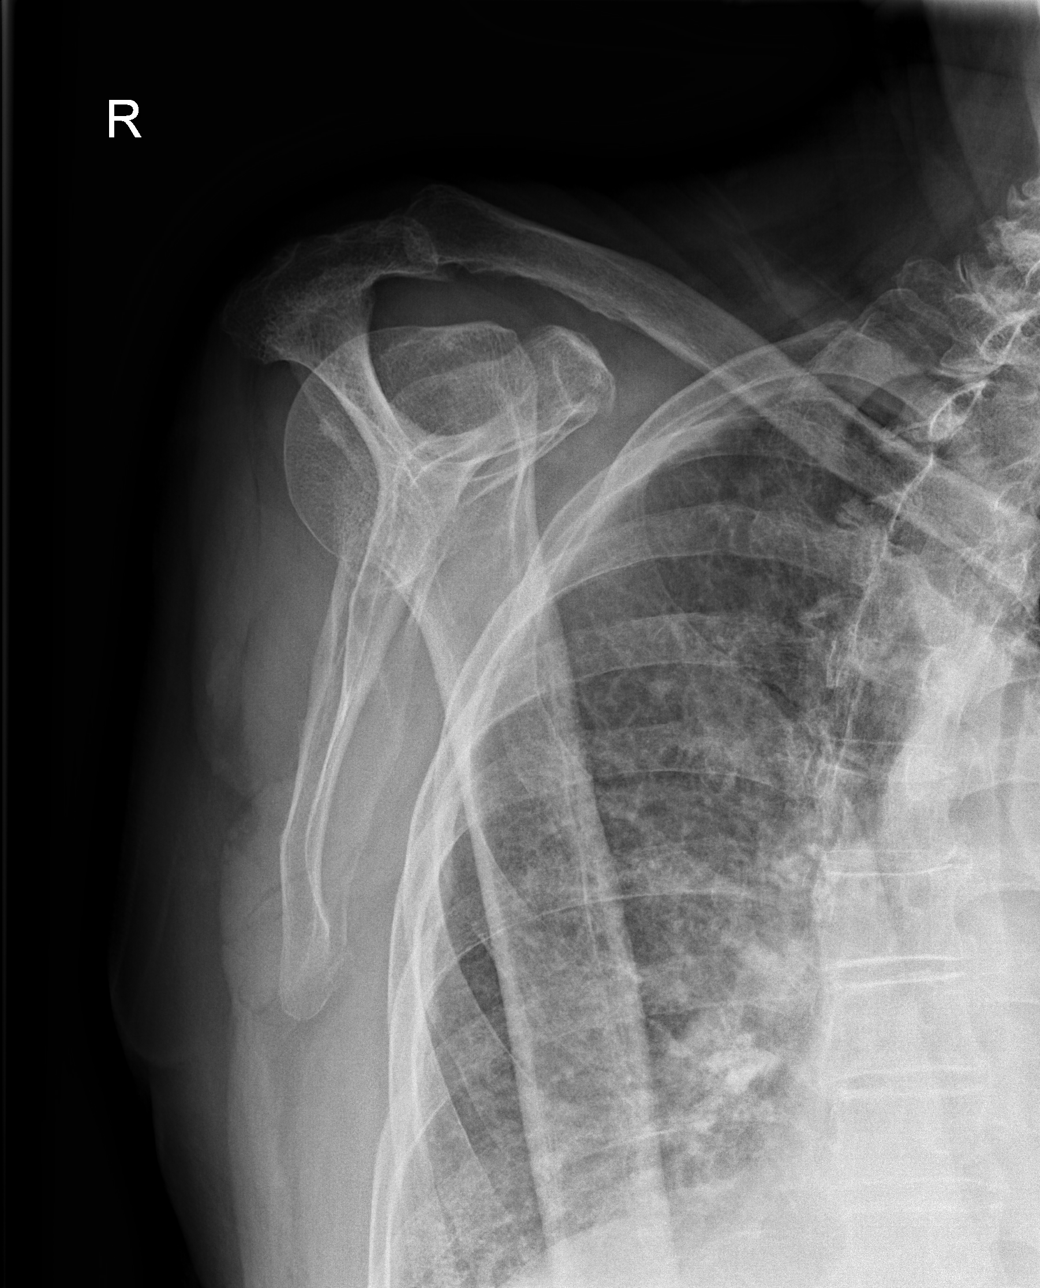

[x shoulder axillary right]
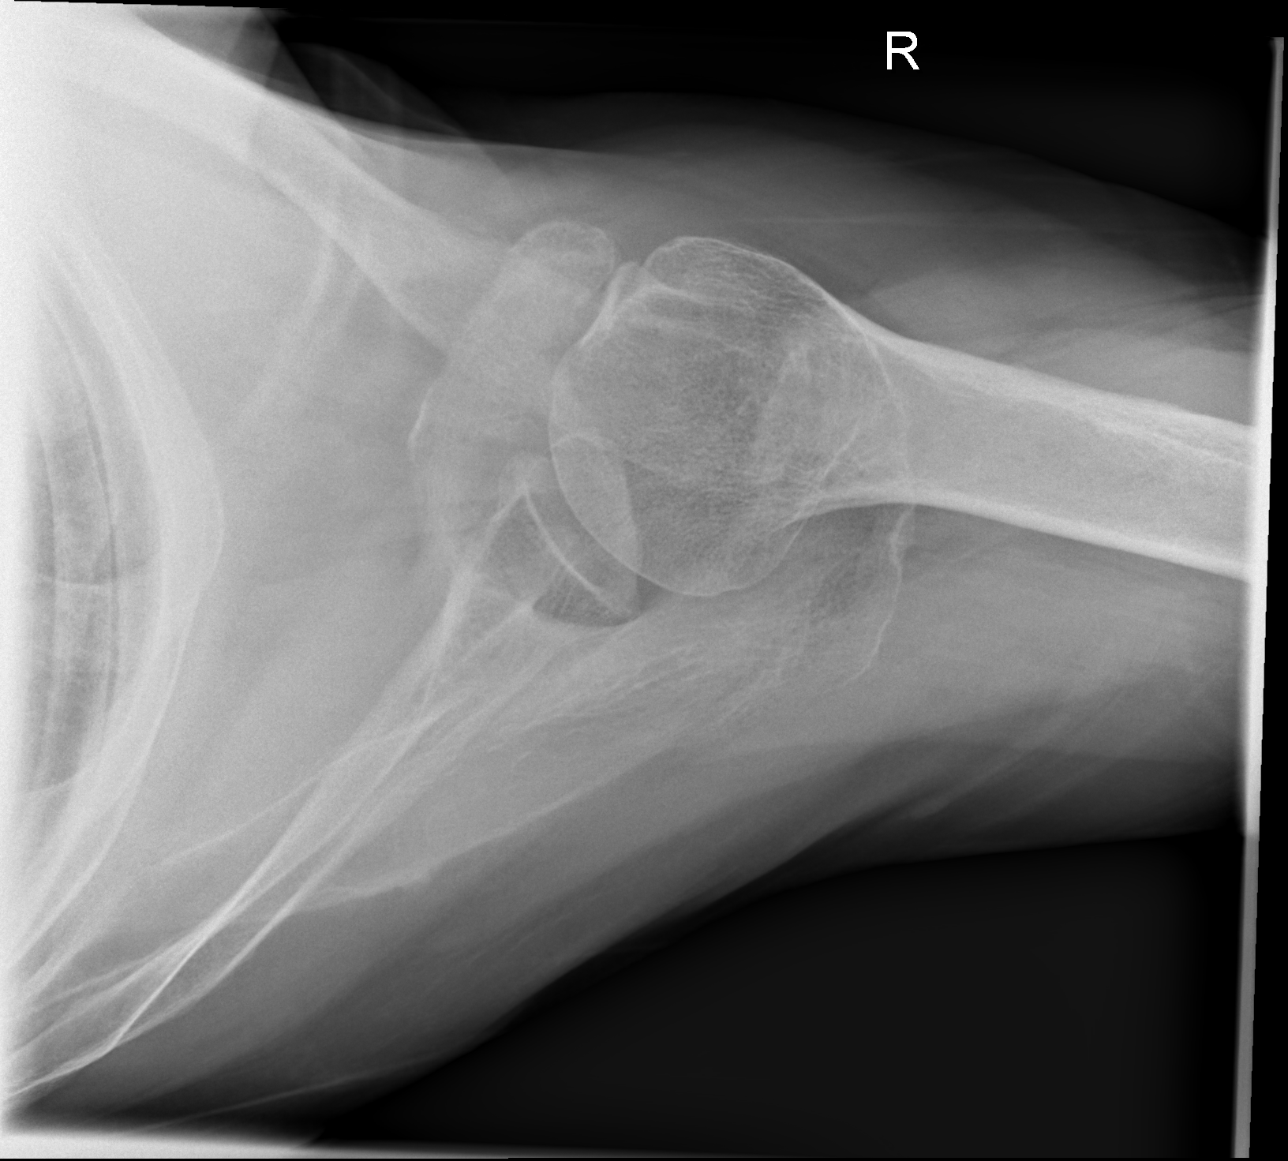

[3 of 3 positions shown; findings below may reference images not displayed]

FINDINGS: There is mild spurring along the undersurface of the acromion. There
is no evidence of acute fracture or dislocation. No lytic or blastic
osseous lesion is seen. Visualized right lung is clear.
IMPRESSION: No acute osseous abnormality identified. Mild spurring along the
undersurface of the acromion.

## 2016-06-19 ENCOUNTER — Other Ambulatory Visit: Payer: Self-pay | Admitting: Orthopedic Surgery

## 2016-06-22 ENCOUNTER — Encounter (HOSPITAL_BASED_OUTPATIENT_CLINIC_OR_DEPARTMENT_OTHER): Payer: Self-pay | Admitting: *Deleted

## 2016-06-22 ENCOUNTER — Encounter (HOSPITAL_BASED_OUTPATIENT_CLINIC_OR_DEPARTMENT_OTHER)
Admission: RE | Admit: 2016-06-22 | Discharge: 2016-06-22 | Disposition: A | Discharge status: Home / Self Care | Payer: Medicare Other | Source: Ambulatory Visit | Attending: Orthopedic Surgery | Admitting: Orthopedic Surgery

## 2016-06-22 ENCOUNTER — Other Ambulatory Visit: Payer: Self-pay

## 2016-06-22 DIAGNOSIS — I10 Essential (primary) hypertension: Secondary | ICD-10-CM | POA: Insufficient documentation

## 2016-06-22 DIAGNOSIS — I4892 Unspecified atrial flutter: Secondary | ICD-10-CM | POA: Diagnosis not present

## 2016-06-22 DIAGNOSIS — L608 Other nail disorders: Secondary | ICD-10-CM | POA: Diagnosis present

## 2016-06-22 DIAGNOSIS — Z01818 Encounter for other preprocedural examination: Secondary | ICD-10-CM | POA: Diagnosis present

## 2016-06-22 DIAGNOSIS — Z7952 Long term (current) use of systemic steroids: Secondary | ICD-10-CM | POA: Diagnosis not present

## 2016-06-22 DIAGNOSIS — K219 Gastro-esophageal reflux disease without esophagitis: Secondary | ICD-10-CM | POA: Diagnosis not present

## 2016-06-22 DIAGNOSIS — I491 Atrial premature depolarization: Secondary | ICD-10-CM | POA: Insufficient documentation

## 2016-06-22 DIAGNOSIS — Z7982 Long term (current) use of aspirin: Secondary | ICD-10-CM | POA: Diagnosis not present

## 2016-06-22 DIAGNOSIS — Z7901 Long term (current) use of anticoagulants: Secondary | ICD-10-CM | POA: Diagnosis not present

## 2016-06-22 DIAGNOSIS — Z87891 Personal history of nicotine dependence: Secondary | ICD-10-CM | POA: Diagnosis not present

## 2016-06-22 DIAGNOSIS — Z79899 Other long term (current) drug therapy: Secondary | ICD-10-CM | POA: Diagnosis not present

## 2016-06-22 DIAGNOSIS — I4891 Unspecified atrial fibrillation: Secondary | ICD-10-CM | POA: Diagnosis not present

## 2016-06-22 LAB — BASIC METABOLIC PANEL
Anion gap: 9 (ref 5–15)
BUN: 15 mg/dL (ref 6–20)
CHLORIDE: 101 mmol/L (ref 101–111)
CO2: 25 mmol/L (ref 22–32)
CREATININE: 1.03 mg/dL (ref 0.61–1.24)
Calcium: 9.5 mg/dL (ref 8.9–10.3)
GFR calc Af Amer: >60 mL/min (ref >60)
GFR calc non Af Amer: >60 mL/min (ref >60)
Glucose, Bld: 142 mg/dL — ABNORMAL HIGH (ref 65–99)
Potassium: 4.6 mmol/L (ref 3.5–5.1)
SODIUM: 135 mmol/L (ref 135–145)

## 2016-06-27 ENCOUNTER — Encounter (HOSPITAL_BASED_OUTPATIENT_CLINIC_OR_DEPARTMENT_OTHER): Payer: Self-pay | Admitting: Orthopedic Surgery

## 2016-06-27 ENCOUNTER — Ambulatory Visit (HOSPITAL_BASED_OUTPATIENT_CLINIC_OR_DEPARTMENT_OTHER): Payer: Medicare Other | Admitting: Anesthesiology

## 2016-06-27 ENCOUNTER — Encounter (HOSPITAL_BASED_OUTPATIENT_CLINIC_OR_DEPARTMENT_OTHER)
Admission: RE | Disposition: A | Discharge status: Home / Self Care | Payer: Self-pay | Source: Ambulatory Visit | Attending: Orthopedic Surgery

## 2016-06-27 ENCOUNTER — Ambulatory Visit (HOSPITAL_BASED_OUTPATIENT_CLINIC_OR_DEPARTMENT_OTHER)
Admission: RE | Admit: 2016-06-27 | Discharge: 2016-06-27 | Disposition: A | Discharge status: Home / Self Care | Payer: Medicare Other | Source: Ambulatory Visit | Attending: Orthopedic Surgery | Admitting: Orthopedic Surgery

## 2016-06-27 DIAGNOSIS — Z79899 Other long term (current) drug therapy: Secondary | ICD-10-CM | POA: Insufficient documentation

## 2016-06-27 DIAGNOSIS — Z7952 Long term (current) use of systemic steroids: Secondary | ICD-10-CM | POA: Insufficient documentation

## 2016-06-27 DIAGNOSIS — L608 Other nail disorders: Secondary | ICD-10-CM | POA: Insufficient documentation

## 2016-06-27 DIAGNOSIS — Z7982 Long term (current) use of aspirin: Secondary | ICD-10-CM | POA: Insufficient documentation

## 2016-06-27 DIAGNOSIS — Z87891 Personal history of nicotine dependence: Secondary | ICD-10-CM | POA: Insufficient documentation

## 2016-06-27 DIAGNOSIS — I4892 Unspecified atrial flutter: Secondary | ICD-10-CM | POA: Insufficient documentation

## 2016-06-27 DIAGNOSIS — I10 Essential (primary) hypertension: Secondary | ICD-10-CM | POA: Insufficient documentation

## 2016-06-27 DIAGNOSIS — Z7901 Long term (current) use of anticoagulants: Secondary | ICD-10-CM | POA: Insufficient documentation

## 2016-06-27 DIAGNOSIS — K219 Gastro-esophageal reflux disease without esophagitis: Secondary | ICD-10-CM | POA: Insufficient documentation

## 2016-06-27 DIAGNOSIS — I4891 Unspecified atrial fibrillation: Secondary | ICD-10-CM | POA: Insufficient documentation

## 2016-06-27 HISTORY — PX: NAILBED REPAIR: SHX5028

## 2016-06-27 HISTORY — DX: Spinal stenosis, site unspecified: M48.00

## 2016-06-27 HISTORY — DX: Cardiac arrhythmia, unspecified: I49.9

## 2016-06-27 HISTORY — DX: Effusion, unspecified ankle: M25.473

## 2016-06-27 SURGERY — REPAIR, NAIL BED
Anesthesia: Monitor Anesthesia Care | Site: Thumb | Laterality: Left

## 2016-06-27 MED ORDER — FENTANYL CITRATE (PF) 100 MCG/2ML IJ SOLN
INTRAMUSCULAR | Status: AC
  Filled 2016-06-27: qty 2

## 2016-06-27 MED ORDER — CHLORHEXIDINE GLUCONATE 4 % EX LIQD: 60.0000 mL | Freq: Once | CUTANEOUS | Status: DC

## 2016-06-27 MED ORDER — FENTANYL CITRATE (PF) 100 MCG/2ML IJ SOLN: 25.0000 ug | INTRAMUSCULAR | Status: DC | PRN

## 2016-06-27 MED ORDER — CEFAZOLIN SODIUM-DEXTROSE 2-4 GM/100ML-% IV SOLN
2.0000 g | INTRAVENOUS | Status: AC
  Administered 2016-06-27: 2 g via INTRAVENOUS

## 2016-06-27 MED ORDER — SCOPOLAMINE 1 MG/3DAYS TD PT72: 1.0000 | MEDICATED_PATCH | Freq: Once | TRANSDERMAL | Status: DC | PRN

## 2016-06-27 MED ORDER — HYDROCODONE-ACETAMINOPHEN 5-325 MG PO TABS: 1.0000 | ORAL_TABLET | Freq: Four times a day (QID) | ORAL | 0 refills | Status: DC | PRN

## 2016-06-27 MED ORDER — CEFAZOLIN SODIUM-DEXTROSE 2-4 GM/100ML-% IV SOLN
INTRAVENOUS | Status: AC
  Filled 2016-06-27: qty 100

## 2016-06-27 MED ORDER — GLYCOPYRROLATE 0.2 MG/ML IJ SOLN: 0.2000 mg | Freq: Once | INTRAMUSCULAR | Status: DC | PRN

## 2016-06-27 MED ORDER — MIDAZOLAM HCL 2 MG/2ML IJ SOLN: 1.0000 mg | INTRAMUSCULAR | Status: DC | PRN

## 2016-06-27 MED ORDER — MEPERIDINE HCL 25 MG/ML IJ SOLN: 6.2500 mg | INTRAMUSCULAR | Status: DC | PRN

## 2016-06-27 MED ORDER — LACTATED RINGERS IV SOLN
INTRAVENOUS | Status: DC
  Administered 2016-06-27: 12:00:00 via INTRAVENOUS

## 2016-06-27 MED ORDER — BUPIVACAINE HCL (PF) 0.25 % IJ SOLN
INTRAMUSCULAR | Status: DC | PRN
  Administered 2016-06-27: 8 mL

## 2016-06-27 MED ORDER — FENTANYL CITRATE (PF) 100 MCG/2ML IJ SOLN
50.0000 ug | INTRAMUSCULAR | Status: DC | PRN
  Administered 2016-06-27 (×2): 50 ug via INTRAVENOUS

## 2016-06-27 MED ORDER — PROPOFOL 10 MG/ML IV BOLUS
INTRAVENOUS | Status: DC | PRN
  Administered 2016-06-27 (×4): 10 mg via INTRAVENOUS

## 2016-06-27 MED ORDER — LIDOCAINE HCL (PF) 0.5 % IJ SOLN
INTRAMUSCULAR | Status: DC | PRN
  Administered 2016-06-27: 30 mL via INTRAVENOUS

## 2016-06-27 SURGICAL SUPPLY — 37 items
BLADE MINI RND TIP GREEN BEAV (BLADE) ×2 IMPLANT
BLADE SURG 15 STRL LF DISP TIS (BLADE) IMPLANT
BLADE SURG 15 STRL SS (BLADE)
BNDG COHESIVE 1X5 TAN STRL LF (GAUZE/BANDAGES/DRESSINGS) ×2 IMPLANT
BNDG ESMARK 4X9 LF (GAUZE/BANDAGES/DRESSINGS) ×2 IMPLANT
CHLORAPREP W/TINT 26ML (MISCELLANEOUS) ×2 IMPLANT
CORDS BIPOLAR (ELECTRODE) ×2 IMPLANT
COVER BACK TABLE 60X90IN (DRAPES) ×2 IMPLANT
COVER MAYO STAND STRL (DRAPES) ×2 IMPLANT
CUFF TOURNIQUET SINGLE 18IN (TOURNIQUET CUFF) ×2 IMPLANT
DRAPE EXTREMITY T 121X128X90 (DRAPE) ×2 IMPLANT
DRAPE SURG 17X23 STRL (DRAPES) ×2 IMPLANT
GAUZE SPONGE 4X4 12PLY STRL (GAUZE/BANDAGES/DRESSINGS) ×2 IMPLANT
GAUZE XEROFORM 1X8 LF (GAUZE/BANDAGES/DRESSINGS) ×2 IMPLANT
GLOVE BIOGEL PI IND STRL 7.0 (GLOVE) ×2 IMPLANT
GLOVE BIOGEL PI IND STRL 8.5 (GLOVE) ×1 IMPLANT
GLOVE BIOGEL PI INDICATOR 7.0 (GLOVE) ×2
GLOVE BIOGEL PI INDICATOR 8.5 (GLOVE) ×1
GLOVE ECLIPSE 6.5 STRL STRAW (GLOVE) ×2 IMPLANT
GLOVE SURG ORTHO 8.0 STRL STRW (GLOVE) ×2 IMPLANT
GOWN STRL REUS W/ TWL LRG LVL3 (GOWN DISPOSABLE) ×1 IMPLANT
GOWN STRL REUS W/TWL LRG LVL3 (GOWN DISPOSABLE) ×1
GOWN STRL REUS W/TWL XL LVL3 (GOWN DISPOSABLE) ×2 IMPLANT
NEEDLE PRECISIONGLIDE 27X1.5 (NEEDLE) ×2 IMPLANT
NS IRRIG 1000ML POUR BTL (IV SOLUTION) ×2 IMPLANT
PACK BASIN DAY SURGERY FS (CUSTOM PROCEDURE TRAY) ×2 IMPLANT
PADDING CAST ABS 4INX4YD NS (CAST SUPPLIES)
PADDING CAST ABS COTTON 4X4 ST (CAST SUPPLIES) IMPLANT
SPLINT FINGER 3.25 BULB 911905 (SOFTGOODS) ×2 IMPLANT
STOCKINETTE 4X48 STRL (DRAPES) ×2 IMPLANT
SUT CHROMIC 6 0 G 1 (SUTURE) ×2 IMPLANT
SUT ETHILON 4 0 PS 2 18 (SUTURE) IMPLANT
SUT VIC AB 4-0 P2 18 (SUTURE) IMPLANT
SYR BULB 3OZ (MISCELLANEOUS) ×2 IMPLANT
SYR CONTROL 10ML LL (SYRINGE) ×2 IMPLANT
TOWEL OR 17X24 6PK STRL BLUE (TOWEL DISPOSABLE) ×2 IMPLANT
UNDERPAD 30X30 (UNDERPADS AND DIAPERS) ×2 IMPLANT

## 2016-06-27 NOTE — Anesthesia Preprocedure Evaluation (Signed)
Anesthesia Evaluation  Patient identified by MRN, date of birth, ID band Patient awake    Reviewed: Allergy & Precautions, NPO status , Patient's Chart, lab work & pertinent test results  Airway Mallampati: II  TM Distance: >3 FB Neck ROM: Full  Mouth opening: Limited Mouth Opening  Dental  (+) Teeth Intact, Dental Advisory Given   Pulmonary former smoker,    breath sounds clear to auscultation       Cardiovascular hypertension, Pt. on medications  Rhythm:Regular Rate:Normal     Neuro/Psych    GI/Hepatic GERD  Medicated,  Endo/Other    Renal/GU      Musculoskeletal   Abdominal   Peds  Hematology   Anesthesia Other Findings   Reproductive/Obstetrics                             Anesthesia Physical Anesthesia Plan  ASA: III  Anesthesia Plan: Bier Block and MAC   Post-op Pain Management:    Induction: Intravenous  Airway Management Planned: Simple Face Mask  Additional Equipment:   Intra-op Plan:   Post-operative Plan:   Informed Consent: I have reviewed the patients History and Physical, chart, labs and discussed the procedure including the risks, benefits and alternatives for the proposed anesthesia with the patient or authorized representative who has indicated his/her understanding and acceptance.   Dental advisory given  Plan Discussed with: CRNA, Anesthesiologist and Surgeon  Anesthesia Plan Comments:         Anesthesia Quick Evaluation

## 2016-06-27 NOTE — Anesthesia Procedure Notes (Signed)
Procedure Name: MAC Date/Time: 06/27/2016 11:55 AM Performed by: Kanoa Phillippi D Pre-anesthesia Checklist: Patient identified, Emergency Drugs available, Suction available, Patient being monitored and Timeout performed Patient Re-evaluated:Patient Re-evaluated prior to inductionOxygen Delivery Method: Simple face mask

## 2016-06-27 NOTE — Brief Op Note (Signed)
06/27/2016  12:24 PM  PATIENT:  Stephen Cuevas  82 y.o. male  PRE-OPERATIVE DIAGNOSIS:  melanotic streak left thumb  POST-OPERATIVE DIAGNOSIS:  melanotic streak left thumb  PROCEDURE:  Procedure(s) with comments: NAILBED biopsy left thumb (Left) - FAB  SURGEON:  Surgeon(s) and Role:    * Daryll Brod, MD - Primary  PHYSICIAN ASSISTANT:   ASSISTANTS: none   ANESTHESIA:   local and regional  EBL:  No intake/output data recorded.  BLOOD ADMINISTERED:none  DRAINS: none   LOCAL MEDICATIONS USED:  BUPIVICAINE   SPECIMEN:  Excision  DISPOSITION OF SPECIMEN:  PATHOLOGY  COUNTS:  YES  TOURNIQUET:   Total Tourniquet Time Documented: Forearm (Left) - 19 minutes Total: Forearm (Left) - 19 minutes   DICTATION: .Other Dictation: Dictation Number [id number]  PLAN OF CARE: Discharge to home after PACU  PATIENT DISPOSITION:  PACU - hemodynamically stable.

## 2016-06-27 NOTE — Op Note (Signed)
Dictation Number [id number]

## 2016-06-27 NOTE — Op Note (Signed)
NAME:  Stephen Cuevas, Stephen Cuevas NO.:  [id number]  MEDICAL RECORD NO.:  [id number]  LOCATION:                                 FACILITY:  PHYSICIAN:  Daryll Brod, M.D.            DATE OF BIRTH:  DATE OF PROCEDURE:  06/27/2016 DATE OF DISCHARGE:                              OPERATIVE REPORT   PREOPERATIVE DIAGNOSIS:  Melanotic streak, left thumb nail bed.  POSTOPERATIVE DIAGNOSIS:  Melanotic streak, left thumb nail bed.  OPERATION:  Excisional biopsy lesion, left thumb nail matrix.  SURGEON:  Daryll Brod, MD  ANESTHESIA:  Forearm IV regional with metacarpal block.  ANESTHESIOLOGIST:  Lorrene Reid, MD.  PLACE OF SURGERY:  [facility].  HISTORY:  The patient is an 82 year old male with a history of a blackened streak proceeding down his left thumb nail, this had been present for several months.  He is referred by Dr. Lavonna Monarch, for biopsy.  We have discussed risks and complications with him, pre, peri, and postoperative course.  He is advised that there is no guarantee of the surgery; the possibility of infection; recurrence of injury to arteries, nerves, tendons; incomplete relief of symptoms; dystrophy.  He is aware that this may be a potential malignancy.  In the preoperative area, the patient was seen, the extremity marked by both patient and surgeon.  Antibiotic given.  PROCEDURE IN DETAIL:  The patient was brought to the operating room, where a forearm IV regional anesthetic was carried out without difficulty.  This was done under the direction of Dr. Al Corpus.  He was prepped with ChloraPrep, in the supine position with the left arm free. A 3-minute dry time was allowed.  Time-out taken, confirming patient and procedure.  The metacarpal block was given with 0.25% bupivacaine without epinephrine, 8 mL was used.  The nail plate was removed with a Soil scientist.  A lesion was then immediately apparent in the nail matrix.  This was at the level  approximately of the lunula.  A Beaver blade was then used to make an elliptical incision proximal too and all the way out to the tip of the thumb.  This was excised in toto, measured approximately 3-4 mm in diameter.  It appeared to be well localized with some pigmentation in the biopsy specimen.  The margins were then undermined radially and ulnarly in the nail matrix.  The wound was irrigated.  The excision defect was then closed with interrupted 6-0 chromic sutures.  A nonadherent gauze was placed into the nail fold proximally and a nonadherent gauze over this.  A sterile compressive dressing and splint was applied to the thumb.  Fingers were left free.  On deflation of the tourniquet, remaining fingers immediately pinked.  He was taken to the recovery room for observation in satisfactory condition.  He will be discharged home to return to the [facility] in 1 week, on Norco.          ______________________________ Daryll Brod, M.D.     GK/MEDQ  D:  06/27/2016  T:  06/27/2016  Job:  [id number]

## 2016-06-27 NOTE — Anesthesia Procedure Notes (Signed)
Anesthesia Regional Block:  Bier block (IV Regional)  Pre-Anesthetic Checklist: ,, timeout performed, Correct Patient, Correct Site, Correct Laterality, Correct Procedure,, site marked, surgical consent,, at surgeon's request Needles:  Injection technique: Single-shot  Needle Type: Other      Needle Gauge: 20 and 20 G    Additional Needles: Bier block (IV Regional) Narrative:   Performed by: Personally       

## 2016-06-27 NOTE — Transfer of Care (Signed)
Immediate Anesthesia Transfer of Care Note  Patient: Stephen Cuevas  Procedure(s) Performed: Procedure(s) with comments: NAILBED biopsy left thumb (Left) - FAB  Patient Location: PACU  Anesthesia Type:Bier block  Level of Consciousness: sedated  Airway & Oxygen Therapy: Patient Spontanous Breathing  Post-op Assessment: Report given to RN and Post -op Vital signs reviewed and stable  Post vital signs: Reviewed and stable  Last Vitals:  Vitals:   06/27/16 1110  BP: (!) 151/62  Pulse: (!) 57  Resp: 20  Temp: 37 C    Last Pain:  Vitals:   06/27/16 1110  TempSrc: Oral         Complications: No apparent anesthesia complications

## 2016-06-27 NOTE — Anesthesia Postprocedure Evaluation (Signed)
Anesthesia Post Note  Patient: Stephen Cuevas  Procedure(s) Performed: Procedure(s) (LRB): NAILBED biopsy left thumb (Left)  Patient location during evaluation: PACU Anesthesia Type: MAC Level of consciousness: awake and alert Pain management: pain level controlled Vital Signs Assessment: post-procedure vital signs reviewed and stable Respiratory status: spontaneous breathing, nonlabored ventilation and respiratory function stable Cardiovascular status: stable and blood pressure returned to baseline Anesthetic complications: no    Last Vitals:  Vitals:   06/27/16 1300 06/27/16 1306  BP: 134/63   Pulse: (!) 51 (!) 54  Resp: 15 15  Temp:      Last Pain:  Vitals:   06/27/16 1110  TempSrc: Oral                 Kailiana Granquist A

## 2016-06-27 NOTE — H&P (Signed)
  [name] is an 82 y.o. male.   Chief Complaint: melanotic streak left thumb HPI: [name] is an 44 year old right-hand-dominant male male who is referred by Dr. Denna Haggard. He has a dark colored streak in his left thumb. He states it has been present from since approximately April of this year. He recalls no history of injury. He is not complaining of any pain or discomfort to this. He has not seen any significant change to it. Proceeds from the proximal lunula area to the tip of the finger. He is on Eliquis but does not know why. This is prescribed by Dr. Nyoka Cowden. He has no history of blood clots he has no history of A. fib he has no history of stroke. He does have a history of sleep apnea  Past Medical History:  Diagnosis Date  . Sleep apnea   History reviewed. No pertinent surgical history.      Past Medical History:  Diagnosis Date  . Ankle edema   . Anxiety   . Dysrhythmia    a-fib/flutter- on eliquis  . GERD (gastroesophageal reflux disease)    no treatment  . Hard of hearing   . Hypertension   . Kidney stone   . Spinal stenosis     Past Surgical History:  Procedure Laterality Date  . addnoid    . CYSTOSCOPY W/ URETEROSCOPY  1980  . KNEE SURGERY    . TONSILLECTOMY      Family History  Problem Relation Age of Onset  . Heart failure Father   . Heart disease Father    Social History:  reports that he quit smoking about 60 years ago. He has a 30.00 pack-year smoking history. He has never used smokeless tobacco. He reports that he drinks alcohol. He reports that he does not use drugs.  Allergies: No Known Allergies  No prescriptions prior to admission.    No results found for this or any previous visit (from the past 48 hour(s)).  No results found.   Pertinent items are noted in HPI.  Height 5\' 6"  (1.676 m), weight 77.1 kg (170 lb).  General appearance: alert, cooperative and appears stated age Head: Normocephalic, without obvious abnormality Neck:  no JVD Resp: clear to auscultation bilaterally Cardio: regular rate and rhythm, S1, S2 normal, no murmur, click, rub or gallop GI: soft, non-tender; bowel sounds normal; no masses,  no organomegaly Extremities: melanotic streak left thumb Pulses: 2+ and symmetric Skin: Skin color, texture, turgor normal. 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 Pre-peri-and postoperative course were discussed along with risks and complications. He is aware that there is no guarantee with the surgery the possibility of infection recurrence injury to arteries nerves tendons incomplete release symptoms dystrophy. He would like to proceed to have this done. We will contact Dr. Nyoka Cowden to follow-up on his Eliquis. He is scheduled for biopsy left thumb nail as an outpatient under regional anesthesia. He is aware that this may be a melanoma.      Trinetta Alemu R 06/27/2016, 10:10 AM

## 2016-06-27 NOTE — Discharge Instructions (Addendum)

## 2016-06-28 ENCOUNTER — Encounter (HOSPITAL_BASED_OUTPATIENT_CLINIC_OR_DEPARTMENT_OTHER): Payer: Self-pay | Admitting: Orthopedic Surgery

## 2017-06-15 ENCOUNTER — Other Ambulatory Visit: Payer: Self-pay | Admitting: Internal Medicine

## 2017-06-15 DIAGNOSIS — M545 Low back pain: Secondary | ICD-10-CM

## 2017-06-21 ENCOUNTER — Ambulatory Visit (HOSPITAL_COMMUNITY): Admission: RE | Admit: 2017-06-21 | Payer: Medicare Other | Source: Ambulatory Visit

## 2017-06-22 ENCOUNTER — Ambulatory Visit (HOSPITAL_COMMUNITY)
Admission: RE | Admit: 2017-06-22 | Discharge: 2017-06-22 | Disposition: A | Discharge status: Home / Self Care | Payer: Medicare Other | Source: Ambulatory Visit | Attending: Internal Medicine | Admitting: Internal Medicine

## 2017-06-22 DIAGNOSIS — M5127 Other intervertebral disc displacement, lumbosacral region: Secondary | ICD-10-CM | POA: Diagnosis not present

## 2017-06-22 DIAGNOSIS — M48061 Spinal stenosis, lumbar region without neurogenic claudication: Secondary | ICD-10-CM | POA: Diagnosis not present

## 2017-06-22 DIAGNOSIS — M545 Low back pain: Secondary | ICD-10-CM

## 2017-06-22 DIAGNOSIS — M5431 Sciatica, right side: Secondary | ICD-10-CM | POA: Insufficient documentation

## 2017-06-22 LAB — POCT I-STAT CREATININE: Creatinine, Ser: 1.3 mg/dL — ABNORMAL HIGH (ref 0.61–1.24)

## 2017-06-22 MED ORDER — GADOBENATE DIMEGLUMINE 529 MG/ML IV SOLN
15.0000 mL | Freq: Once | INTRAVENOUS | Status: AC | PRN
  Administered 2017-06-22: 15 mL via INTRAVENOUS

## 2018-08-16 ENCOUNTER — Other Ambulatory Visit (HOSPITAL_COMMUNITY)
Admission: RE | Admit: 2018-08-16 | Discharge: 2018-08-16 | Disposition: A | Discharge status: Home / Self Care | Payer: Medicare Other | Source: Ambulatory Visit | Attending: Internal Medicine | Admitting: Internal Medicine

## 2018-08-16 DIAGNOSIS — D649 Anemia, unspecified: Secondary | ICD-10-CM | POA: Insufficient documentation

## 2018-08-16 LAB — CBC WITH DIFFERENTIAL/PLATELET
Basophils Absolute: 0 10*3/uL (ref 0.0–0.1)
Basophils Relative: 1 %
EOS PCT: 0 %
Eosinophils Absolute: 0 10*3/uL (ref 0.0–0.7)
HCT: 39.2 % (ref 39.0–52.0)
Hemoglobin: 12.6 g/dL — ABNORMAL LOW (ref 13.0–17.0)
LYMPHS ABS: 0.7 10*3/uL (ref 0.7–4.0)
LYMPHS PCT: 8 %
MCH: 31.2 pg (ref 26.0–34.0)
MCHC: 32.1 g/dL (ref 30.0–36.0)
MCV: 97 fL (ref 78.0–100.0)
MONOS PCT: 9 %
Monocytes Absolute: 0.8 10*3/uL (ref 0.1–1.0)
Neutro Abs: 7.1 10*3/uL (ref 1.7–7.7)
Neutrophils Relative %: 82 %
PLATELETS: 175 10*3/uL (ref 150–400)
RBC: 4.04 MIL/uL — AB (ref 4.22–5.81)
RDW: 12.8 % (ref 11.5–15.5)
WBC: 8.7 10*3/uL (ref 4.0–10.5)

## 2018-08-16 LAB — VITAMIN B12: Vitamin B-12: 1382 pg/mL — ABNORMAL HIGH (ref 180–914)

## 2020-01-12 ENCOUNTER — Other Ambulatory Visit: Payer: Self-pay | Admitting: Dermatology

## 2020-02-20 ENCOUNTER — Ambulatory Visit: Payer: Medicare Other | Admitting: Cardiology

## 2020-02-20 ENCOUNTER — Encounter: Payer: Self-pay | Admitting: Cardiology

## 2020-02-20 ENCOUNTER — Other Ambulatory Visit: Payer: Self-pay

## 2020-02-20 VITALS — BP 137/74 | HR 60 | Temp 97.4°F | Resp 16 | Ht 66.0 in | Wt 166.4 lb

## 2020-02-20 DIAGNOSIS — R600 Localized edema: Secondary | ICD-10-CM

## 2020-02-20 DIAGNOSIS — I453 Trifascicular block: Secondary | ICD-10-CM

## 2020-02-20 DIAGNOSIS — I480 Paroxysmal atrial fibrillation: Secondary | ICD-10-CM

## 2020-02-20 DIAGNOSIS — I10 Essential (primary) hypertension: Secondary | ICD-10-CM

## 2020-02-20 DIAGNOSIS — R0609 Other forms of dyspnea: Secondary | ICD-10-CM

## 2020-02-20 NOTE — Progress Notes (Signed)
Primary Physician/Referring:  [name], DO  Patient ID: Stephen Cuevas, male    DOB: 12/23/1925, 86 y.o.   MRN: [id number]  Chief Complaint  Patient presents with  . Atrial Fibrillation  . DOE  . Leg Swelling  . New Patient (Initial Visit)   HPI:    Stephen Cuevas  is a 86 y.o. Caucasian male with history of atrial fibrillation/flutter diagnosed in 2016 by Dr. Boris Lown (Retired and no documentation of arrhythmia)  and has been chronically on Eliquis since, hyperlipidemia, hypertension, referred to me for evaluation of worsening shortness of breath and leg edema.  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  Since then his activity has decreased, he recently established with Dr. Francesco Sor and during evaluation noted to have leg edema and also due to mild dyspnea referred to me for cardiac evaluation.  Patient otherwise remains asymptomatic and does admit that his activity level has decreased markedly.  He has been sitting mostly in the recliner most of the times.  Denies chest pain, states that dyspnea has worsened since COVID-19 due to reduced activity.  No PND or orthopnea.  Denies chest pain or palpitations.  Past Medical History:  Diagnosis Date  . Ankle edema   . Anxiety   . Dysrhythmia    a-fib/flutter- on eliquis  . GERD (gastroesophageal reflux disease)    no treatment  . Hard of hearing   . Hypertension   . Kidney stone   . Spinal stenosis    Past Surgical History:  Procedure Laterality Date  . addnoid    . CYSTOSCOPY W/ URETEROSCOPY  1980  . KNEE SURGERY    . NAILBED REPAIR Left 06/27/2016   Procedure: NAILBED biopsy left thumb;  Surgeon: Daryll Brod, MD;  Location: [place];  Service: Orthopedics;  Laterality: Left;  FAB  . TONSILLECTOMY     Family History  Problem Relation Age of Onset  . Heart failure Father   . Heart disease Father     Social History   Tobacco  Use  . Smoking status: Former Smoker    Packs/day: 1.50    Years: 20.00    Pack years: 30.00    Quit date: 03/13/1956    Years since quitting: 63.9  . Smokeless tobacco: Never Used  Substance Use Topics  . Alcohol use: Yes    Comment: daily   ROS  Review of Systems  Cardiovascular: Positive for dyspnea on exertion (mild) and leg swelling. Negative for chest pain, near-syncope and orthopnea.  Gastrointestinal: Negative for melena.   Objective  Blood pressure 137/74, pulse 60, temperature (!) 97.4 F (36.3 C), temperature source Temporal, resp. rate 16, height '5\' 6"'  (1.676 m), weight 166 lb 6.4 oz (75.5 kg), SpO2 97 %.  Vitals with BMI 02/20/2020 06/27/2016 06/27/2016  Height '5\' 6"'  - -  Weight 166 lbs 6 oz - -  BMI 46.80 - -  Systolic 321 224 -  Diastolic 74 66 -  Pulse 60 54 54     Physical Exam  HENT:  Head: Atraumatic.  Cardiovascular: Normal rate, regular rhythm, S1 normal, S2 normal and intact distal pulses. Exam reveals no gallop.  Murmur heard.  Early systolic murmur is present with a grade of 2/6 at the upper right sternal border. Pulses:      Carotid pulses are 2+ on the right side and 2+ on the left side.      Popliteal  pulses are 2+ on the right side and 2+ on the left side.       Dorsalis pedis pulses are 2+ on the right side and 2+ on the left side.       Posterior tibial pulses are 1+ on the right side and 1+ on the left side.  No JVD.  2 + Ankle pitting edema  Pulmonary/Chest: Effort normal and breath sounds normal.  Abdominal: Soft. Bowel sounds are normal.   Laboratory examination:   External labs:   Labs 02/16/2020: Serum glucose 105 mg, BUN 22, creatinine 1.7, EGFR 37.9 mL, potassium 4.7. Hb 12.3/HCT 40.9, platelets 190. Total cholesterol 162, triglycerides 70, HDL 63, LDL 85.  Non-HDL cholesterol 99. PSA normal.   Medications and allergies  No Known Allergies   Current Outpatient Medications  Medication Instructions  . apixaban (ELIQUIS) 5 mg,  Oral, 2 times daily  . aspirin EC 81 mg, Oral, Daily with breakfast  . atorvastatin (LIPITOR) 10 mg, Oral, Daily with breakfast  . diltiazem (CARDIZEM CD) 120 mg, Oral, Daily at bedtime  . lisinopril (ZESTRIL) 10 mg, Oral, Daily  . Multiple Vitamins-Minerals (ICAPS AREDS 2 PO) 2 tablets, Oral, 2 times daily  . omeprazole (PRILOSEC) 20 mg, Oral, Daily with breakfast  . PREDNISONE PO 3 mg, Oral, Daily with breakfast  . tamsulosin (FLOMAX) 0.4 mg, Oral, Daily  . timolol (TIMOPTIC) 0.5 % ophthalmic solution 1 drop, Right Eye,  Every morning - 10a  . traZODone (DESYREL) 100 mg, Oral, Daily at bedtime   Lisinopril on hold since 02/16/19 per PCP Radiology:   Chest x-ray PA and lateral view 02/16/2020: Mild hyperextension, patchy right basilar infiltrate, borderline cardiomegaly, atherosclerosis, thoracic levoscoliosis and spondylosis.  Cardiac Studies:   EKG  EKG 02/20/2020: Sinus bradycardia with first-degree AV block at the rate of 52 bpm, left axis deviation, left anterior fascicular block.  Right bundle branch block.  Trifascicular block.  Low-voltage complexes.  No evidence of ischemia.  EKG 02/20/2020: Atrial fibrillation with controlled ventricular response at rate of 49 bpm, left axis deviation, left anterior fascicular block.  Right bundle branch block.  Nonspecific T abnormality.    Assessment     ICD-10-CM   1. Dyspnea on exertion  R06.00   2. Paroxysmal atrial fibrillation ([place]). CHA2DS2-VASc Score is 4.  Yearly risk of stroke: 4% (A, HTN, Vasc Dz suspected).    I48.0 PCV ECHOCARDIOGRAM COMPLETE  3. Bilateral leg edema  R60.0   4. Trifascicular block  I45.3   5. Essential hypertension  I10 EKG 12-Lead     No orders of the defined types were placed in this encounter.   Medications Discontinued During This Encounter  Medication Reason  . HYDROcodone-acetaminophen (NORCO) 5-325 MG tablet No longer needed (for PRN medications)    Recommendations:   Stephen Cuevas  is a 86  y.o. Caucasian male with history of atrial fibrillation/flutter diagnosed in 2016 by Dr. Boris Lown (Retired and no documentation of arrhythmia)  and has been chronically on Eliquis since, hyperlipidemia, hypertension, referred to me for evaluation of worsening shortness of breath and leg edema.  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  Since then his activity has decreased, he recently established with Dr. Francesco Sor and during evaluation noted to have leg edema and also due to mild dyspnea referred to me for cardiac evaluation.  I suspect his reduced physical activity and also deconditioning, especially since COVID-19 pandemic, he has reduced his  physical activity.  Also he had severe sciatica that made him not exercise.  Leg edema is mostly confined to the ankles, suspect this is dependent edema.  Do not suspect congestive heart failure.  However he is back in atrial fibrillation today and it is paroxysmal as the first EKG did not reveal atrial fibrillation.  He also has underlying trifascicular block.  He is presently on diltiazem and also on timolol eyedrops, in view of trifascicular block extreme caution needs to be observed in combination can potentially cause third-degree AV block.  I will keep an eye on this closely.  I will repeat an echocardiogram, I would like to see him back in 4 to 6 weeks for follow-up.  Unless he develops worsening dyspnea or worsening fatigue, we can aim to leave atrial fibrillation as this.  I also suspect he may be back in sinus rhythm on his next visit.  I do not think he needs ischemic work-up.  Happy birthday.   Stephen Prows, MD, [id number] [facility] 02/20/2020, 12:27 PM [facility]. [place] Office: [phone]

## 2020-02-26 ENCOUNTER — Ambulatory Visit: Payer: Medicare Other

## 2020-02-26 ENCOUNTER — Other Ambulatory Visit: Payer: Self-pay

## 2020-02-26 DIAGNOSIS — I480 Paroxysmal atrial fibrillation: Secondary | ICD-10-CM

## 2020-03-27 NOTE — Progress Notes (Signed)
Primary Physician/Referring:  [name], DO  Patient ID: Stephen Cuevas, male    DOB: 18-Dec-1925, 86 y.o.   MRN: [id number]  Chief Complaint  Patient presents with  . Atrial Fibrillation  . Edema   HPI:    Stephen Cuevas  is a 86 y.o. Caucasian male with history of atrial fibrillation/flutter diagnosed in 2016 by Dr. Boris Lown and has been chronically on Eliquis since, hyperlipidemia, hypertension, presents for evaluation of worsening shortness of breath and leg edema, last seen on 03/16/2020. Evaluated by PCP on 03/22/20 and found to be in Mobitz II AVB with 2:1 conduction and asymptomatic.  Since being off of diltiazem and also Timoptic eyedrops, his energy level is improved, his activity level is improved, no further leg edema.   Past Medical History:  Diagnosis Date  . Ankle edema   . Anxiety   . Dysrhythmia    a-fib/flutter- on eliquis  . GERD (gastroesophageal reflux disease)    no treatment  . Hard of hearing   . Hypertension   . Kidney stone   . Spinal stenosis    Past Surgical History:  Procedure Laterality Date  . addnoid    . CYSTOSCOPY W/ URETEROSCOPY  1980  . KNEE SURGERY    . NAILBED REPAIR Left 06/27/2016   Procedure: NAILBED biopsy left thumb;  Surgeon: Daryll Brod, MD;  Location: [place];  Service: Orthopedics;  Laterality: Left;  FAB  . TONSILLECTOMY     Family History  Problem Relation Age of Onset  . Heart failure Father   . Heart disease Father     Social History   Tobacco Use  . Smoking status: Former Smoker    Packs/day: 1.50    Years: 20.00    Pack years: 30.00    Quit date: 03/13/1956    Years since quitting: 64.0  . Smokeless tobacco: Never Used  Substance Use Topics  . Alcohol use: Yes    Comment: daily   Marital Status: Widowed ROS  Review of Systems  Cardiovascular: Positive for dyspnea on exertion (mild) and leg swelling. Negative for chest pain, near-syncope and orthopnea.  Gastrointestinal: Negative for  melena.   Objective  Blood pressure 115/73, pulse 87, temperature 97.9 F (36.6 C), height _0  (1.676 m), weight 164 lb (74.4 kg), SpO2 96 %.  Vitals with BMI 03/29/2020 02/20/2020 06/27/2016  Height _1  _2  -  Weight 164 lbs 166 lbs 6 oz -  BMI 36.14 43.15 -  Systolic 400 867 619  Diastolic 73 74 66  Pulse 87 60 54     Physical Exam  HENT:  Head: Atraumatic.  Cardiovascular: Normal rate, regular rhythm, S1 normal, S2 normal and intact distal pulses. Exam reveals no gallop.  Murmur heard.  Early systolic murmur is present with a grade of 2/6 at the upper right sternal border. Pulses:      Carotid pulses are 2+ on the right side and 2+ on the left side.      Popliteal pulses are 2+ on the right side and 2+ on the left side.       Dorsalis pedis pulses are 2+ on the right side and 2+ on the left side.       Posterior tibial pulses are 1+ on the right side and 1+ on the left side.  No JVD.  2 + Ankle pitting edema  Pulmonary/Chest: Effort normal and breath sounds normal.  Abdominal: Soft. Bowel sounds are normal.  Laboratory examination:   External labs:  02/16/2020: Serum glucose 105 mg, BUN 22, creatinine 1.7, EGFR 37.9 mL, potassium 4.7. Hb 12.3/HCT 40.9, platelets 190. Total cholesterol 162, triglycerides 70, HDL 63, LDL 85.  Non-HDL cholesterol 99. PSA normal   Medications and allergies  No Known Allergies   Current Outpatient Medications  Medication Instructions  . apixaban (ELIQUIS) 5 mg, Oral, 2 times daily  . atorvastatin (LIPITOR) 10 mg, Oral, Daily with breakfast  . hydrochlorothiazide (HYDRODIURIL) 12.5 mg, Oral, Daily  . Melatonin-Pyridoxine (MELATONEX PO) Oral  . mirtazapine (REMERON) 7.5 mg, Oral, Daily at bedtime  . Multiple Vitamins-Minerals (ICAPS AREDS 2 PO) 2 tablets, Oral, 2 times daily  . omeprazole (PRILOSEC) 20 mg, Oral, Daily with breakfast  . PREDNISONE PO 3 mg, Oral, Daily with breakfast  . tamsulosin (FLOMAX) 0.4 mg, Oral, Daily  .  traZODone (DESYREL) 100 mg, Oral, Daily at bedtime   Lisinopril on hold since 02/16/19 per PCP  Radiology:   Chest x-ray PA and lateral view 02/16/2020: Mild hyperextension, patchy right basilar infiltrate, borderline cardiomegaly, atherosclerosis, thoracic levoscoliosis and spondylosis.  Cardiac Studies:   Echocardiogram 02/26/2020:  Normal LV systolic function with visual EF 60-65%. Left ventricle cavity is normal in size. Mild left ventricular hypertrophy. Normal global wall  motion. Doppler evidence of grade II diastolic dysfunction. Calculated EF 66%.  Left atrial cavity is severely dilated, 39m/m2.  Right atrial cavity is dilated.  No evidence of aortic stenosis. Mild aortic regurgitation.  Mild to moderate mitral regurgitation.  Moderate tricuspid regurgitation. Moderate pulmonary hypertension. RVSP measures 51 mmHg.  IVC is dilated with a respiratory response of <50%.  No prior study for comparison.   EKG  EKG 03/29/2020: Sinus rhythm with first-degree block at rate of 83 bpm, leftward enlargement, left axis deviation, left intrafascicular block.  Right bundle branch block.  Bifascicular block.  Low-voltage complexes.  Nonspecific T abnormality.  EKG 03/22/2020, PCP EKG.  Sinus tachycardia at rate of 100 bpm with 2: 1 AV conduction, Mobitz 2 AV block 1: 1.  Left axis deviation, left anterior fascicular block.  Right bundle branch block.  02/20/2020: Sinus bradycardia with first-degree AV block at the rate of 52 bpm, left axis deviation, left anterior fascicular block.  Right bundle branch block.  Trifascicular block.  Low-voltage complexes.  No evidence of ischemia.  02/20/2020: Atrial fibrillation with controlled ventricular response at rate of 49 bpm, left axis deviation, left anterior fascicular block.  Right bundle branch block.  Nonspecific T abnormality.    Assessment     ICD-10-CM   1. Paroxysmal atrial fibrillation ([place] CHA2DS2-VASc Score is 4.  Yearly risk of  stroke: 4% (A, HTN, Vasc Dz suspected).    I48.0 EKG 12-Lead    apixaban (ELIQUIS) 2.5 MG TABS tablet  2. Essential hypertension  I10   3. Trifascicular block  I45.3      Meds ordered this encounter  Medications  . apixaban (ELIQUIS) 2.5 MG TABS tablet    Sig: Take 2 tablets (5 mg total) by mouth 2 (two) times daily.    Dispense:  180 tablet    Refill:  3    Medications Discontinued During This Encounter  Medication Reason  . diltiazem (CARDIZEM CD) 120 MG 24 hr capsule Discontinued by provider  . timolol (TIMOPTIC) 0.5 % ophthalmic solution Patient Preference  . lisinopril (PRINIVIL,ZESTRIL) 10 MG tablet Discontinued by provider  . aspirin EC 81 MG tablet Discontinued by provider  . apixaban (ELIQUIS) 5 MG TABS tablet Reorder  Recommendations:   Stephen Stephen Cuevas  is a 89 y.o. Caucasian male with history of atrial fibrillation/flutter diagnosed in 2016 by Dr. Boris Lown  and has been chronically on Eliquis since, hyperlipidemia, hypertension, presents for evaluation of worsening shortness of breath and leg edema.  On his last office visit on 02/20/2020, he was transiently in atrial fibrillation but converted back to sinus rhythm and has underlying trifascicular block.  I have discontinued diltiazem and he was on Timoptic eyedrops and was seen by his PCP on 03/22/2020 he was found to be in Mobitz 2 AV block but asymptomatic.  He is now off of Timoptic.  He remains asymptomatic with marked improvement in dyspnea since discontinuation of negative chronotropic agents.  There is no clinical evidence of heart failure.  I will see him back in 6 months, there is no resolution of Mobitz 2 AV block.  I have discussed with the son regarding indications for coming back to see me sooner including dizziness, near syncope, worsening dyspnea or persistent palpitations.  Continue Eliquis 2.5 mg twice daily.  Prescription refilled.   Stephen Prows, MD, [facility] 03/29/2020, 3:16 PM [facility].  [place] Pager: [phone] Office: [phone]

## 2020-03-29 ENCOUNTER — Encounter: Payer: Self-pay | Admitting: Cardiology

## 2020-03-29 ENCOUNTER — Other Ambulatory Visit: Payer: Self-pay

## 2020-03-29 ENCOUNTER — Ambulatory Visit: Payer: Medicare Other | Admitting: Cardiology

## 2020-03-29 VITALS — BP 115/73 | HR 87 | Temp 97.9°F | Ht 66.0 in | Wt 164.0 lb

## 2020-03-29 DIAGNOSIS — I10 Essential (primary) hypertension: Secondary | ICD-10-CM

## 2020-03-29 DIAGNOSIS — I480 Paroxysmal atrial fibrillation: Secondary | ICD-10-CM

## 2020-03-29 DIAGNOSIS — I453 Trifascicular block: Secondary | ICD-10-CM

## 2020-03-29 MED ORDER — APIXABAN 2.5 MG PO TABS: 5.0000 mg | ORAL_TABLET | Freq: Two times a day (BID) | ORAL | 3 refills | Status: DC

## 2020-04-05 ENCOUNTER — Ambulatory Visit: Payer: Medicare Other | Admitting: Cardiology

## 2020-04-27 ENCOUNTER — Other Ambulatory Visit: Payer: Self-pay | Admitting: Cardiology

## 2020-04-27 ENCOUNTER — Other Ambulatory Visit: Payer: Self-pay

## 2020-04-27 ENCOUNTER — Ambulatory Visit: Payer: Medicare Other

## 2020-04-27 DIAGNOSIS — I480 Paroxysmal atrial fibrillation: Secondary | ICD-10-CM

## 2020-04-27 DIAGNOSIS — I453 Trifascicular block: Secondary | ICD-10-CM

## 2020-04-27 DIAGNOSIS — I441 Atrioventricular block, second degree: Secondary | ICD-10-CM

## 2020-04-27 NOTE — Progress Notes (Unsigned)
ICD-10-CM   1. Paroxysmal atrial fibrillation (HCC)  I48.0   2. Trifascicular block  I45.3   3. Second degree Mobitz II AV block  I44.1      Event monitor 04/27/2020 through 05/17/2020: Patient had paroxysmal episodes of atrial fibrillation with 4% atrial fibrillation burden.  Predominant rhythm was sinus rhythm with nonconducted P waves and 1: 1 Mobitz 2 AV block at 10:21 PM on 05/10/2020.  Patient also had a 3.4-second ventricular standstill at 10:35 PM on 05/04/2020, asymptomatic, underlying sinus rhythm.  There are rare occasional nonconducted PACs.  No complete heart block or high degree AV block.  No symptoms reported.

## 2020-04-27 NOTE — Progress Notes (Signed)
Dr. [name] called me about paroxysmal episodes of A. Flutter with RVR and also Mobitz II AV Block while at the office and patient with dizziness and dyspnea. Started Cardizem CD due to symptomatic AFL with RVR.  Plan Tele 2 weeks and decide about pacemaker. Previously negative chronotropic agents including Betoptic was discontinued  By me.    ICD-10-CM   1. Paroxysmal atrial fibrillation (HCC)  I48.0 LONG TERM MONITOR-LIVE TELEMETRY (3-14 DAYS)  2. Trifascicular block  I45.3 LONG TERM MONITOR-LIVE TELEMETRY (3-14 DAYS)  3. Second degree Mobitz II AV block  I44.1 LONG TERM MONITOR-LIVE TELEMETRY (3-14 DAYS)     Adrian Prows, MD, [facility] 04/27/2020, 6:41 AM [facility]. [place] Office: [phone]

## 2020-04-28 ENCOUNTER — Telehealth: Payer: Self-pay

## 2020-04-28 DIAGNOSIS — I480 Paroxysmal atrial fibrillation: Secondary | ICD-10-CM

## 2020-04-28 DIAGNOSIS — I453 Trifascicular block: Secondary | ICD-10-CM

## 2020-04-28 NOTE — Telephone Encounter (Signed)
Received a call from [place] in regards to patient's monitor reading. [employer] tech stated that patient was stable yesterday, however today he is in Afib. 90 beats per minute.

## 2020-04-28 NOTE — Telephone Encounter (Signed)
Paroxysmal atrial fibrillation V rate 90/min, sinus arrest, no significant pause. Observe Unscheduled (Alert) on Event monitoring. Patient asymptomatic.     ICD-10-CM   1. Paroxysmal atrial fibrillation (HCC)  I48.0   2. Trifascicular block  I45.3     Adrian Prows, MD, [facility] 04/28/2020, 11:02 PM [facility]. [place] Office: [phone]

## 2020-06-02 ENCOUNTER — Encounter: Payer: Self-pay | Admitting: Cardiology

## 2020-06-02 ENCOUNTER — Other Ambulatory Visit: Payer: Self-pay | Admitting: Student

## 2020-06-02 ENCOUNTER — Ambulatory Visit: Payer: Medicare Other | Admitting: Cardiology

## 2020-06-02 ENCOUNTER — Ambulatory Visit (HOSPITAL_COMMUNITY)
Admission: RE | Admit: 2020-06-02 | Discharge: 2020-06-02 | Disposition: A | Discharge status: Home / Self Care | Payer: Medicare Other | Source: Ambulatory Visit | Attending: Internal Medicine | Admitting: Internal Medicine

## 2020-06-02 ENCOUNTER — Other Ambulatory Visit: Payer: Self-pay | Admitting: Internal Medicine

## 2020-06-02 ENCOUNTER — Other Ambulatory Visit: Payer: Self-pay

## 2020-06-02 VITALS — BP 108/58 | HR 77 | Resp 14 | Ht 66.0 in | Wt 163.0 lb

## 2020-06-02 DIAGNOSIS — R0609 Other forms of dyspnea: Secondary | ICD-10-CM | POA: Insufficient documentation

## 2020-06-02 DIAGNOSIS — I495 Sick sinus syndrome: Secondary | ICD-10-CM | POA: Insufficient documentation

## 2020-06-02 DIAGNOSIS — R001 Bradycardia, unspecified: Secondary | ICD-10-CM

## 2020-06-02 DIAGNOSIS — I453 Trifascicular block: Secondary | ICD-10-CM

## 2020-06-02 DIAGNOSIS — I480 Paroxysmal atrial fibrillation: Secondary | ICD-10-CM | POA: Insufficient documentation

## 2020-06-02 DIAGNOSIS — R2689 Other abnormalities of gait and mobility: Secondary | ICD-10-CM | POA: Insufficient documentation

## 2020-06-02 DIAGNOSIS — K219 Gastro-esophageal reflux disease without esophagitis: Secondary | ICD-10-CM | POA: Diagnosis not present

## 2020-06-02 DIAGNOSIS — F419 Anxiety disorder, unspecified: Secondary | ICD-10-CM | POA: Diagnosis not present

## 2020-06-02 DIAGNOSIS — R42 Dizziness and giddiness: Secondary | ICD-10-CM

## 2020-06-02 DIAGNOSIS — Z87891 Personal history of nicotine dependence: Secondary | ICD-10-CM | POA: Diagnosis not present

## 2020-06-02 DIAGNOSIS — Z7901 Long term (current) use of anticoagulants: Secondary | ICD-10-CM | POA: Diagnosis not present

## 2020-06-02 DIAGNOSIS — Z7952 Long term (current) use of systemic steroids: Secondary | ICD-10-CM | POA: Diagnosis not present

## 2020-06-02 DIAGNOSIS — I10 Essential (primary) hypertension: Secondary | ICD-10-CM | POA: Insufficient documentation

## 2020-06-02 DIAGNOSIS — Z79899 Other long term (current) drug therapy: Secondary | ICD-10-CM | POA: Diagnosis not present

## 2020-06-02 DIAGNOSIS — I442 Atrioventricular block, complete: Secondary | ICD-10-CM

## 2020-06-02 DIAGNOSIS — I4949 Other premature depolarization: Secondary | ICD-10-CM

## 2020-06-02 LAB — BASIC METABOLIC PANEL
Anion gap: 15 (ref 5–15)
BUN: 19 mg/dL (ref 8–23)
CO2: 21 mmol/L — ABNORMAL LOW (ref 22–32)
Calcium: 9.1 mg/dL (ref 8.9–10.3)
Chloride: 103 mmol/L (ref 98–111)
Creatinine, Ser: 1.57 mg/dL — ABNORMAL HIGH (ref 0.61–1.24)
GFR calc Af Amer: 44 mL/min — ABNORMAL LOW (ref >60)
GFR calc non Af Amer: 38 mL/min — ABNORMAL LOW (ref >60)
Glucose, Bld: 124 mg/dL — ABNORMAL HIGH (ref 70–99)
Potassium: 4.2 mmol/L (ref 3.5–5.1)
Sodium: 139 mmol/L (ref 135–145)

## 2020-06-02 LAB — CBC
HCT: 38.3 % — ABNORMAL LOW (ref 39.0–52.0)
Hemoglobin: 12.2 g/dL — ABNORMAL LOW (ref 13.0–17.0)
MCH: 29.1 pg (ref 26.0–34.0)
MCHC: 31.9 g/dL (ref 30.0–36.0)
MCV: 91.4 fL (ref 80.0–100.0)
Platelets: 227 10*3/uL (ref 150–400)
RBC: 4.19 MIL/uL — ABNORMAL LOW (ref 4.22–5.81)
RDW: 13.3 % (ref 11.5–15.5)
WBC: 12.1 10*3/uL — ABNORMAL HIGH (ref 4.0–10.5)
nRBC: 0 % (ref 0.0–0.2)

## 2020-06-02 MED ORDER — SODIUM CHLORIDE 0.9 % IV SOLN: 80.0000 mg | INTRAVENOUS | Status: DC

## 2020-06-02 MED ORDER — CEFAZOLIN SODIUM-DEXTROSE 2-4 GM/100ML-% IV SOLN: 2.0000 g | INTRAVENOUS | Status: DC

## 2020-06-02 MED ORDER — SODIUM CHLORIDE 0.9 % IV SOLN: INTRAVENOUS | Status: DC

## 2020-06-02 NOTE — Progress Notes (Signed)
Primary Physician/Referring:  [name], DO  Patient ID: Stephen Cuevas, male    DOB: [birth year], 86 y.o.   MRN: [id number]  Chief Complaint  Patient presents with  . Results    Monitor  . Follow-up  . Atrial Fibrillation  . Dizziness   HPI:    Stephen Cuevas  is a 34 y.o. Caucasian male with history of atrial fibrillation/flutter diagnosed in 2016 by Dr. Boris Lown and has been chronically on Eliquis since, hyperlipidemia, hypertension, presents for evaluation of worsening shortness of breath and leg edema, and paroxymal atrial fibrillation. He had called his PCP not feeling well and was found to be A. Fib with RVR with HR of 120-140/min, EKG revealed A. Fib with controlled ventricular response.  Initially he was on diltiazem and also Timoptic eyedrops which I had discontinued due to bradycardia, trifascicular block and advanced age.  Symptoms of fatigue and leg edema improved since then.  Diltiazem was reinitiated due to A. fib with RVR.  He underwent event monitor and presents for follow-up.  States that he feels very weak and tired more than usual, also feels dizzy.  He has not had any syncope.  Is accompanied by son at the bedside.  His leg edema has improved since being off of high-dose diltiazem and also Timoptic eyedrops.  Past Medical History:  Diagnosis Date  . Ankle edema   . Anxiety   . Dysrhythmia    a-fib/flutter- on eliquis  . GERD (gastroesophageal reflux disease)    no treatment  . Hard of hearing   . Hypertension   . Kidney stone   . Spinal stenosis    Past Surgical History:  Procedure Laterality Date  . addnoid    . CYSTOSCOPY W/ URETEROSCOPY  1980  . KNEE SURGERY    . NAILBED REPAIR Left 06/27/2016   Procedure: NAILBED biopsy left thumb;  Surgeon: Daryll Brod, MD;  Location: [facility];  Service: Orthopedics;  Laterality: Left;  FAB  . TONSILLECTOMY     Family History  Problem Relation Age of Onset  . Heart failure Father   .  Heart disease Father     Social History   Tobacco Use  . Smoking status: Former Smoker    Packs/day: 1.50    Years: 20.00    Pack years: 30.00    Types: Cigarettes, Pipe, Cigars    Quit date: 03/13/1956    Years since quitting: 64.2  . Smokeless tobacco: Never Used  Substance Use Topics  . Alcohol use: Yes    Comment: daily   Marital Status: Widowed ROS  Review of Systems  Constitutional: Positive for malaise/fatigue.  Cardiovascular: Positive for dyspnea on exertion (mild) and leg swelling (occasional). Negative for chest pain, near-syncope and orthopnea.  Gastrointestinal: Negative for melena.  Neurological: Positive for dizziness.   Objective  Blood pressure (!) 108/58, pulse 77, resp. rate 14, height '5\' 6"'  (1.676 m), weight 163 lb (73.9 kg), SpO2 95 %.  Vitals with BMI 06/02/2020 06/02/2020 03/29/2020  Height '5\' 6"'  '5\' 6"'  '5\' 6"'   Weight 163 lbs 163 lbs 164 lbs  BMI 26.32 39.03 00.92  Systolic 330 076 226  Diastolic 69 58 73  Pulse 94 77 87     Physical Exam HENT:     Head: Atraumatic.  Cardiovascular:     Rate and Rhythm: Normal rate. Rhythm irregular.     Pulses: Intact distal pulses.          Carotid pulses are  2+ on the right side and 2+ on the left side.      Popliteal pulses are 2+ on the right side and 2+ on the left side.       Dorsalis pedis pulses are 2+ on the right side and 2+ on the left side.       Posterior tibial pulses are 1+ on the right side and 1+ on the left side.     Heart sounds: S1 normal and S2 normal. Murmur heard.  Early systolic murmur is present with a grade of 2/6 at the upper right sternal border.  No gallop.      Comments: No JVD.  Trace bilateral leg edema Pulmonary:     Effort: Pulmonary effort is normal.     Breath sounds: Normal breath sounds.  Abdominal:     General: Bowel sounds are normal.     Palpations: Abdomen is soft.    Laboratory examination:   External labs:  02/16/2020: Serum glucose 105 mg, BUN 22, creatinine  1.7, EGFR 37.9 mL, potassium 4.7. Hb 12.3/HCT 40.9, platelets 190. Total cholesterol 162, triglycerides 70, HDL 63, LDL 85.  Non-HDL cholesterol 99. PSA normal   Medications and allergies  No Known Allergies   Current Outpatient Medications  Medication Instructions  . apixaban (ELIQUIS) 5 mg, Oral, 2 times daily  . atorvastatin (LIPITOR) 10 mg, Oral, Daily with breakfast  . diltiazem (CARDIZEM) 120 mg, Oral, Daily  . hydrochlorothiazide (HYDRODIURIL) 12.5 mg, Oral, Daily  . Melatonin-Pyridoxine (MELATONEX PO) Oral  . mirtazapine (REMERON) 7.5 mg, Oral, Daily at bedtime  . Multiple Vitamins-Minerals (ICAPS AREDS 2 PO) 2 tablets, Oral, 2 times daily  . omeprazole (PRILOSEC) 20 mg, Oral, Daily with breakfast  . PREDNISONE PO 3 mg, Oral, Daily with breakfast  . tamsulosin (FLOMAX) 0.4 mg, Oral, Daily  . traZODone (DESYREL) 100 mg, Oral, Daily at bedtime   Lisinopril on hold since 02/16/19 per PCP  Radiology:   Chest x-ray PA and lateral view 02/16/2020: Mild hyperextension, patchy right basilar infiltrate, borderline cardiomegaly, atherosclerosis, thoracic levoscoliosis and spondylosis.  Cardiac Studies:   Echocardiogram 02/26/2020:  Normal LV systolic function with visual EF 60-65%. Left ventricle cavity is normal in size. Mild left ventricular hypertrophy. Normal global wall  motion. Doppler evidence of grade II diastolic dysfunction. Calculated EF 66%.  Left atrial cavity is severely dilated, 64m/m2.  Right atrial cavity is dilated.  No evidence of aortic stenosis. Mild aortic regurgitation.  Mild to moderate mitral regurgitation.  Moderate tricuspid regurgitation. Moderate pulmonary hypertension. RVSP measures 51 mmHg.  IVC is dilated with a respiratory response of <50%.  No prior study for comparison.  Event monitor 04/27/2020 through 05/17/2020: Patient had paroxysmal episodes of atrial fibrillation with 4% atrial fibrillation burden. Predominant rhythm was sinus rhythm with  nonconducted P waves and 1: 1 Mobitz 2 AV  block at 10:21 PM on 05/10/2020. Patient also had a 3.4-second ventricular standstill at 10:35 PM on 05/04/2020,   EKG  EKG 06/02/2020: Underlying sinus bradycardia at rate of 30 bpm with complete heart block and junctional escape, frequent PACs in bigeminal pattern that appeared to be conducted.  Left axis deviation, left anterior fascicular block.  Right bundle branch block.   EKG 03/29/2020: Sinus rhythm with first-degree block at rate of 83 bpm, leftward enlargement, left axis deviation, left intrafascicular block.  Right bundle branch block.  Bifascicular block.  Low-voltage complexes.  Nonspecific T abnormality.  EKG 03/22/2020, PCP EKG.  Sinus tachycardia at rate of 100 bpm  with 2: 1 AV conduction, Mobitz 2 AV block 1: 1.  Left axis deviation, left anterior fascicular block.  Right bundle branch block.  02/20/2020: Sinus bradycardia with first-degree AV block at the rate of 52 bpm, left axis deviation, left anterior fascicular block.  Right bundle branch block.  Trifascicular block.  Low-voltage complexes.  No evidence of ischemia.  02/20/2020: Atrial fibrillation with controlled ventricular response at rate of 49 bpm, left axis deviation, left anterior fascicular block.  Right bundle branch block.  Nonspecific T abnormality.    Assessment     ICD-10-CM   1. Tachycardia-bradycardia syndrome ([facility])  I49.5 EKG 12-Lead  2. Paroxysmal atrial fibrillation ([facility])  I48.0   3. Trifascicular block  I45.3   4. Junctional escape rhythm  I49.49      No orders of the defined types were placed in this encounter.   There are no discontinued medications.  Recommendations:   Stephen Cuevas  is a 76 y.o. Caucasian male with history of atrial fibrillation/flutter diagnosed in 2016 by Dr. Boris Lown and has been chronically on Eliquis since, hyperlipidemia, hypertension, lives independently and was very active until recent Covid pandemic and has reduced his  activity and has found decreased energy since then.  He now presents for evaluation of worsening shortness of breath and leg edema, and paroxymal atrial fibrillation. He had called his PCP not feeling well and was found to be A. Fib with RVR with HR of 120-140/min, EKG revealed A. Fib with controlled ventricular response.  Initially he was on diltiazem and also Timoptic eyedrops which I had discontinued due to bradycardia, trifascicular block and advanced age.  Symptoms of fatigue and leg edema improved since then.  Diltiazem was reinitiated due to A. fib with RVR.  He underwent event monitor and presents for follow-up.  I reviewed EKG today, he has junctional escape rhythm at 30 bpm, frequent PACs that appeared to be conducted and also underlying complete heart block.  He has A. fib with RVR episodes and has classic sinus node dysfunction and tachycardia-bradycardia syndrome in which I am unable to control his heart rate without use of negative inotropic agents.  He needs permanent pacemaker implantation.  Dr. Adam Phenix was gracious enough to review the EKG and history and accepted to see him att the hospital for pacemaker implantation.  Further recommendations to follow.  I discussed with the patient and his son at the bedside regarding relatively urgent if not emergent need for pacemaker implantation in view of dizziness, risk of fall and also development of complete heart block with central for symptomatic bradycardia.  Patient and his son are willing to proceed. This was a 40 minute encounter for coordination of care, complex decision making.   Adrian Prows, MD, [facility] 06/02/2020, 7:51 PM Office: [phone]

## 2020-06-02 NOTE — Consult Note (Signed)
ELECTROPHYSIOLOGY CONSULT NOTE  Patient ID: Stephen Cuevas, MRN: [id number], DOB/AGE: 17-Apr-1926 86 y.o. Admit date: 06/02/2020 Date of Consult: 06/02/2020  Primary Physician: [name], DO Primary Cardiologist: Lavone Nian*     Stephen Cuevas is a 86 y.o. male who is being seen today for the evaluation of tachybrady at the request of JG.    HPI Stephen Cuevas is a 86 y.o. 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  History of atrial fibrillation diagnosed in 2016.  Anticoagulated with Eliquis.  Seen by PCP early spring.  Some edema.  Some complaints of shortness of breath.  Referred to Dr. Lavone Nian.  ECGs on the first visit demonstrated sinus rhythm with frequent blocked PACs resulting in functional bigeminy and an hour later atrial fibrillation with reasonable conduction.  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  Diltiazem and atenolol were discontinued.  Seen 5/21.  Prior to this his PCP had reported "Mobitz two AV block with 2: 1 conduction and asymptomatic "  He was seen by his PCP in June.  During that visit, he had recurrence of tachycardia associated with some symptoms of wobbliness.  His heart rate was recorded 140.  Diltiazem was resumed.  He saw Dr. [name] today with more bradycardia.  A call was made regarding tachybradycardia syndrome.  As noted above the patient takes Eliquis and has been on it regularly. An event recorder was obtained which was personally reviewed.  It demonstrated frequent blocked PACs and functional bradycardia, sinus pauses of more than 3 seconds during waking hours, atrial fibrillation/flutter with rates in the 120s-140s.   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  Since Covid, related to the isolation, he has been much less active.  He spends much more time in a chair reclined.  Symptoms of wobbliness which are unpredictable.  Episodes of  lightheadedness which are also unpredictable.  Some dyspnea on exertion.  These do not correlate well.  He has no awareness of tachycardia.  Thromboembolic risk factors ( age - -2, HTN-1,) for a CHADSVASc Score of >=3   DATE TEST EF   4/21 Echo   60-65% % LAE ( 80cc/m2)        Date Cr K Hgb  7/21 1.57 4.2 12.2    Past Medical History:  Diagnosis Date  . Ankle edema   . Anxiety   . Dysrhythmia    a-fib/flutter- on eliquis  . GERD (gastroesophageal reflux disease)    no treatment  . Hard of hearing   . Hypertension   . Kidney stone   . Spinal stenosis       Surgical History:  Past Surgical History:  Procedure Laterality Date  . addnoid    . CYSTOSCOPY W/ URETEROSCOPY  1980  . KNEE SURGERY    . NAILBED REPAIR Left 06/27/2016   Procedure: NAILBED biopsy left thumb;  Surgeon: Daryll Brod, MD;  Location: [facility];  Service: Orthopedics;  Laterality: Left;  FAB  . TONSILLECTOMY       Home Meds: Current Meds  Medication Sig  . apixaban (ELIQUIS) 2.5 MG TABS tablet Take 2 tablets (5 mg total) by mouth 2 (two) times daily.  Marland Kitchen atorvastatin (LIPITOR) 10 MG tablet Take 10 mg by mouth daily with breakfast.   . diltiazem (CARDIZEM) 120 MG tablet Take 120 mg by mouth daily.  . hydrochlorothiazide (HYDRODIURIL) 12.5 MG tablet  Take 12.5 mg by mouth daily.  . Melatonin-Pyridoxine (MELATONEX PO) Take by mouth.  . mirtazapine (REMERON) 7.5 MG tablet Take 7.5 mg by mouth at bedtime.   . Multiple Vitamins-Minerals (ICAPS AREDS 2 PO) Take 2 tablets by mouth 2 (two) times daily.  Marland Kitchen omeprazole (PRILOSEC) 20 MG capsule Take 20 mg by mouth daily with breakfast.   . PREDNISONE PO Take 3 mg by mouth daily with breakfast.   . tamsulosin (FLOMAX) 0.4 MG CAPS capsule Take 0.4 mg by mouth daily.  . traZODone (DESYREL) 100 MG tablet Take 100 mg by mouth at bedtime.    Allergies: No Known Allergies  Social History   Socioeconomic History  . Marital status: Widowed    Spouse  name: Not on file  . Number of children: 4  . Years of education: Not on file  . Highest education level: Not on file  Occupational History  . Not on file  Tobacco Use  . Smoking status: Former Smoker    Packs/day: 1.50    Years: 20.00    Pack years: 30.00    Types: Cigarettes, Pipe, Cigars    Quit date: 03/13/1956    Years since quitting: 64.2  . Smokeless tobacco: Never Used  Vaping Use  . Vaping Use: Never used  Substance and Sexual Activity  . Alcohol use: Yes    Comment: daily  . Drug use: No  . Sexual activity: Not on file  Other Topics Concern  . Not on file  Social History Narrative  . Not on file   Social Determinants of Health   Financial Resource Strain:   . Difficulty of Paying Living Expenses:   Food Insecurity:   . Worried About Charity fundraiser in the Last Year:   . Arboriculturist in the Last Year:   Transportation Needs:   . Film/video editor (Medical):   Marland Kitchen Lack of Transportation (Non-Medical):   Physical Activity:   . Days of Exercise per Week:   . Minutes of Exercise per Session:   Stress:   . Feeling of Stress :   Social Connections:   . Frequency of Communication with Friends and Family:   . Frequency of Social Gatherings with Friends and Family:   . Attends Religious Services:   . Active Member of Clubs or Organizations:   . Attends Archivist Meetings:   Marland Kitchen Marital Status:   Intimate Partner Violence:   . Fear of Current or Ex-Partner:   . Emotionally Abused:   Marland Kitchen Physically Abused:   . Sexually Abused:      Family History  Problem Relation Age of Onset  . Heart failure Father   . Heart disease Father      ROS:  Please see the history of present illness.     All other systems reviewed and negative.    Physical Exam:  Blood pressure (!) 146/69, pulse 94, temperature (P) 98.6 F (37 C), height 5\' 6"  (1.676 m), weight 73.9 kg, SpO2 97 %. General: Well developed, well nourished male in no acute distress. Head:  Normocephalic, atraumatic, sclera non-icteric, no xanthomas, nares are without discharge. EENT: normal  Lymph Nodes:  none Neck: Negative for carotid bruits. JVD not elevated. Back:without scoliosis kyphosis  Lungs: Clear bilaterally to auscultation without wheezes, rales, or rhonchi. Breathing is unlabored. Heart: irrgular rate 2/6 murmur . No rubs, or gallops appreciated. Abdomen: Soft, non-tender, non-distended with normoactive bowel sounds. No hepatomegaly. No rebound/guarding. No obvious abdominal masses.  Msk:  Strength and tone appear normal for age. Extremities: No clubbing or cyanosis. No edema.  Distal pedal pulses are 2+ and equal bilaterally. Skin: Warm and Dry Neuro: Alert and oriented X 3. CN III-XII intact Grossly normal sensory and motor function . Psych:  Responds to questions appropriately with a normal affect.      Labs: Cardiac Enzymes No results for input(s): CKTOTAL, CKMB, TROPONINI in the last 72 hours. CBC Lab Results  Component Value Date   WBC 12.1 (H) 06/02/2020   HGB 12.2 (L) 06/02/2020   HCT 38.3 (L) 06/02/2020   MCV 91.4 06/02/2020   PLT 227 06/02/2020   PROTIME: No results for input(s): LABPROT, INR in the last 72 hours. Chemistry  Recent Labs  Lab 06/02/20 1300  NA 139  K 4.2  CL 103  CO2 21*  BUN 19  CREATININE 1.57*  CALCIUM 9.1  GLUCOSE 124*   Lipids No results found for: CHOL, HDL, LDLCALC, TRIG BNP No results found for: PROBNP Thyroid Function Tests: No results for input(s): TSH, T4TOTAL, T3FREE, THYROIDAB in the last 72 hours.  Invalid input(s): FREET3 Miscellaneous No results found for: DDIMER  Radiology/Studies:  LONG TERM MONITOR-LIVE TELEMETRY (3-14 DAYS)  Result Date: 06/02/2020 Event monitor 04/27/2020 through 05/17/2020: Patient had paroxysmal episodes of atrial fibrillation with 4% atrial fibrillation burden. Predominant rhythm was sinus rhythm with nonconducted P waves and 1: 1 Mobitz 2 AV block at 10:21 PM on  05/10/2020. Patient also had a 3.4-second ventricular standstill at 10:35 PM on 05/04/2020,   EKG: Sinus at 80 with frequent blocked PACs and competing junctional escape's.  Right bundle branch block and left axis deviation.\\  ECG 03/22/2020 demonstrates sinus rhythm with frequent PACs.  Many are blocked but some conduct. ECG 03/29/2020 demonstrated sinus rhythm at 83 Intervals 20/14/47 with right bundle branch block Left axis deviation -34    Assessment and Plan:  Atrial fibrillation with variable ventricular rates 60s-140s  Sinus node dysfunction with pausing  Frequent atrial ectopy including atrial bigeminy with blocked PACs resulting in functional bradycardia with rates into the 30s  Gait instability  Dyspnea on exertion   The patient has longstanding history of paroxysmal atrial fibrillation with variable ventricular rates documented over the last few months by report.  Efforts to temper the rapid rates using diltiazem have been complicated by bradycardia.  This is been described as heart block; I think that that is probably not right.  There is frequent atrial ectopy, much of which is quite premature resulting in blocked PACs and functional bradycardia as noted with heart rates into the 30s.  It may well be that this contributes to episodic lightheadedness and gait instability and dyspnea.  Sinus pauses are also noted.  As appreciated by Dr. [name], it will be difficult to treat the tachycardia which may also be contributing to his dyspnea and lightheadedness as suggested by the event at his PCPs office unless we have protection against bradycardia which is already been seen.  However, he took his Eliquis this morning.  This is a nonemergent procedure and so I recommended that we defer until tomorrow and allow the anticoagulant effect to dissipate  Have reviewed this extensively with this son and patient  They are agreeable  The benefits and risks were reviewed including but  not limited to death,  perforation, infection, lead dislodgement and device malfunction.  The patient understands agrees and is willing to proceed.      Virl Axe

## 2020-06-02 NOTE — Progress Notes (Signed)
Error

## 2020-06-02 NOTE — H&P (View-Only) (Signed)
ELECTROPHYSIOLOGY CONSULT NOTE  Patient ID: Stephen Cuevas, MRN: [id number], DOB/AGE: [birth year] 86 y.o. Admit date: 06/02/2020 Date of Consult: 06/02/2020  Primary Physician: [name], DO Primary Cardiologist: Lavone Nian*     Stephen Cuevas is a 86 y.o. male who is being seen today for the evaluation of tachybrady at the request of JG.    HPI Stephen Cuevas is a 86 y.o. 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  History of atrial fibrillation diagnosed in 2016.  Anticoagulated with Eliquis.  Seen by PCP early spring.  Some edema.  Some complaints of shortness of breath.  Referred to Dr. Lavone Nian.  ECGs on the first visit demonstrated sinus rhythm with frequent blocked PACs resulting in functional bigeminy and an hour later atrial fibrillation with reasonable conduction.  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  Diltiazem and atenolol were discontinued.  Seen 5/21.  Prior to this his PCP had reported "Mobitz two AV block with 2: 1 conduction and asymptomatic "  He was seen by his PCP in June.  During that visit, he had recurrence of tachycardia associated with some symptoms of wobbliness.  His heart rate was recorded 140.  Diltiazem was resumed.  He saw Dr. [name] today with more bradycardia.  A call was made regarding tachybradycardia syndrome.  As noted above the patient takes Eliquis and has been on it regularly. An event recorder was obtained which was personally reviewed.  It demonstrated frequent blocked PACs and functional bradycardia, sinus pauses of more than 3 seconds during waking hours, atrial fibrillation/flutter with rates in the 120s-140s.   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  Since Covid, related to the isolation, he has been much less active.  He spends much more time in a chair reclined.  Symptoms of wobbliness which are unpredictable.  Episodes of  lightheadedness which are also unpredictable.  Some dyspnea on exertion.  These do not correlate well.  He has no awareness of tachycardia.  Thromboembolic risk factors ( age - -2, HTN-1,) for a CHADSVASc Score of >=3   DATE TEST EF   4/21 Echo   60-65% % LAE ( 80cc/m2)        Date Cr K Hgb  7/21 1.57 4.2 12.2    Past Medical History:  Diagnosis Date  . Ankle edema   . Anxiety   . Dysrhythmia    a-fib/flutter- on eliquis  . GERD (gastroesophageal reflux disease)    no treatment  . Hard of hearing   . Hypertension   . Kidney stone   . Spinal stenosis       Surgical History:  Past Surgical History:  Procedure Laterality Date  . addnoid    . CYSTOSCOPY W/ URETEROSCOPY  1980  . KNEE SURGERY    . NAILBED REPAIR Left 06/27/2016   Procedure: NAILBED biopsy left thumb;  Surgeon: Daryll Brod, MD;  Location: [facility];  Service: Orthopedics;  Laterality: Left;  FAB  . TONSILLECTOMY       Home Meds: Current Meds  Medication Sig  . apixaban (ELIQUIS) 2.5 MG TABS tablet Take 2 tablets (5 mg total) by mouth 2 (two) times daily.  Marland Kitchen atorvastatin (LIPITOR) 10 MG tablet Take 10 mg by mouth daily with breakfast.   . diltiazem (CARDIZEM) 120 MG tablet Take 120 mg by mouth daily.  . hydrochlorothiazide (HYDRODIURIL) 12.5 MG tablet  Take 12.5 mg by mouth daily.  . Melatonin-Pyridoxine (MELATONEX PO) Take by mouth.  . mirtazapine (REMERON) 7.5 MG tablet Take 7.5 mg by mouth at bedtime.   . Multiple Vitamins-Minerals (ICAPS AREDS 2 PO) Take 2 tablets by mouth 2 (two) times daily.  Marland Kitchen omeprazole (PRILOSEC) 20 MG capsule Take 20 mg by mouth daily with breakfast.   . PREDNISONE PO Take 3 mg by mouth daily with breakfast.   . tamsulosin (FLOMAX) 0.4 MG CAPS capsule Take 0.4 mg by mouth daily.  . traZODone (DESYREL) 100 MG tablet Take 100 mg by mouth at bedtime.    Allergies: No Known Allergies  Social History   Socioeconomic History  . Marital status: Widowed    Spouse  name: Not on file  . Number of children: 4  . Years of education: Not on file  . Highest education level: Not on file  Occupational History  . Not on file  Tobacco Use  . Smoking status: Former Smoker    Packs/day: 1.50    Years: 20.00    Pack years: 30.00    Types: Cigarettes, Pipe, Cigars    Quit date: 03/13/1956    Years since quitting: 64.2  . Smokeless tobacco: Never Used  Vaping Use  . Vaping Use: Never used  Substance and Sexual Activity  . Alcohol use: Yes    Comment: daily  . Drug use: No  . Sexual activity: Not on file  Other Topics Concern  . Not on file  Social History Narrative  . Not on file   Social Determinants of Health   Financial Resource Strain:   . Difficulty of Paying Living Expenses:   Food Insecurity:   . Worried About Charity fundraiser in the Last Year:   . Arboriculturist in the Last Year:   Transportation Needs:   . Film/video editor (Medical):   Marland Kitchen Lack of Transportation (Non-Medical):   Physical Activity:   . Days of Exercise per Week:   . Minutes of Exercise per Session:   Stress:   . Feeling of Stress :   Social Connections:   . Frequency of Communication with Friends and Family:   . Frequency of Social Gatherings with Friends and Family:   . Attends Religious Services:   . Active Member of Clubs or Organizations:   . Attends Archivist Meetings:   Marland Kitchen Marital Status:   Intimate Partner Violence:   . Fear of Current or Ex-Partner:   . Emotionally Abused:   Marland Kitchen Physically Abused:   . Sexually Abused:      Family History  Problem Relation Age of Onset  . Heart failure Father   . Heart disease Father      ROS:  Please see the history of present illness.     All other systems reviewed and negative.    Physical Exam:  Blood pressure (!) 146/69, pulse 94, temperature (P) 98.6 F (37 C), height 5\' 6"  (1.676 m), weight 73.9 kg, SpO2 97 %. General: Well developed, well nourished male in no acute distress. Head:  Normocephalic, atraumatic, sclera non-icteric, no xanthomas, nares are without discharge. EENT: normal  Lymph Nodes:  none Neck: Negative for carotid bruits. JVD not elevated. Back:without scoliosis kyphosis  Lungs: Clear bilaterally to auscultation without wheezes, rales, or rhonchi. Breathing is unlabored. Heart: irrgular rate 2/6 murmur . No rubs, or gallops appreciated. Abdomen: Soft, non-tender, non-distended with normoactive bowel sounds. No hepatomegaly. No rebound/guarding. No obvious abdominal masses.  Msk:  Strength and tone appear normal for age. Extremities: No clubbing or cyanosis. No edema.  Distal pedal pulses are 2+ and equal bilaterally. Skin: Warm and Dry Neuro: Alert and oriented X 3. CN III-XII intact Grossly normal sensory and motor function . Psych:  Responds to questions appropriately with a normal affect.      Labs: Cardiac Enzymes No results for input(s): CKTOTAL, CKMB, TROPONINI in the last 72 hours. CBC Lab Results  Component Value Date   WBC 12.1 (H) 06/02/2020   HGB 12.2 (L) 06/02/2020   HCT 38.3 (L) 06/02/2020   MCV 91.4 06/02/2020   PLT 227 06/02/2020   PROTIME: No results for input(s): LABPROT, INR in the last 72 hours. Chemistry  Recent Labs  Lab 06/02/20 1300  NA 139  K 4.2  CL 103  CO2 21*  BUN 19  CREATININE 1.57*  CALCIUM 9.1  GLUCOSE 124*   Lipids No results found for: CHOL, HDL, LDLCALC, TRIG BNP No results found for: PROBNP Thyroid Function Tests: No results for input(s): TSH, T4TOTAL, T3FREE, THYROIDAB in the last 72 hours.  Invalid input(s): FREET3 Miscellaneous No results found for: DDIMER  Radiology/Studies:  LONG TERM MONITOR-LIVE TELEMETRY (3-14 DAYS)  Result Date: 06/02/2020 Event monitor 04/27/2020 through 05/17/2020: Patient had paroxysmal episodes of atrial fibrillation with 4% atrial fibrillation burden. Predominant rhythm was sinus rhythm with nonconducted P waves and 1: 1 Mobitz 2 AV block at 10:21 PM on  05/10/2020. Patient also had a 3.4-second ventricular standstill at 10:35 PM on 05/04/2020,   EKG: Sinus at 80 with frequent blocked PACs and competing junctional escape's.  Right bundle branch block and left axis deviation.\\  ECG 03/22/2020 demonstrates sinus rhythm with frequent PACs.  Many are blocked but some conduct. ECG 03/29/2020 demonstrated sinus rhythm at 83 Intervals 20/14/47 with right bundle branch block Left axis deviation -34    Assessment and Plan:  Atrial fibrillation with variable ventricular rates 60s-140s  Sinus node dysfunction with pausing  Frequent atrial ectopy including atrial bigeminy with blocked PACs resulting in functional bradycardia with rates into the 30s  Gait instability  Dyspnea on exertion   The patient has longstanding history of paroxysmal atrial fibrillation with variable ventricular rates documented over the last few months by report.  Efforts to temper the rapid rates using diltiazem have been complicated by bradycardia.  This is been described as heart block; I think that that is probably not right.  There is frequent atrial ectopy, much of which is quite premature resulting in blocked PACs and functional bradycardia as noted with heart rates into the 30s.  It may well be that this contributes to episodic lightheadedness and gait instability and dyspnea.  Sinus pauses are also noted.  As appreciated by Dr. [name], it will be difficult to treat the tachycardia which may also be contributing to his dyspnea and lightheadedness as suggested by the event at his PCPs office unless we have protection against bradycardia which is already been seen.  However, he took his Eliquis this morning.  This is a nonemergent procedure and so I recommended that we defer until tomorrow and allow the anticoagulant effect to dissipate  Have reviewed this extensively with this son and patient  They are agreeable  The benefits and risks were reviewed including but  not limited to death,  perforation, infection, lead dislodgement and device malfunction.  The patient understands agrees and is willing to proceed.      Virl Axe

## 2020-06-03 ENCOUNTER — Encounter (HOSPITAL_COMMUNITY)
Admission: RE | Disposition: A | Discharge status: Home / Self Care | Payer: Self-pay | Source: Home / Self Care | Attending: Cardiology

## 2020-06-03 ENCOUNTER — Ambulatory Visit (HOSPITAL_COMMUNITY)
Admission: RE | Admit: 2020-06-03 | Discharge: 2020-06-04 | Disposition: A | Discharge status: Home / Self Care | Payer: Medicare Other | Attending: Cardiology | Admitting: Cardiology

## 2020-06-03 ENCOUNTER — Encounter (HOSPITAL_COMMUNITY): Payer: Self-pay | Admitting: Cardiology

## 2020-06-03 DIAGNOSIS — Z8249 Family history of ischemic heart disease and other diseases of the circulatory system: Secondary | ICD-10-CM | POA: Diagnosis not present

## 2020-06-03 DIAGNOSIS — H9190 Unspecified hearing loss, unspecified ear: Secondary | ICD-10-CM | POA: Insufficient documentation

## 2020-06-03 DIAGNOSIS — I480 Paroxysmal atrial fibrillation: Secondary | ICD-10-CM | POA: Insufficient documentation

## 2020-06-03 DIAGNOSIS — Z7901 Long term (current) use of anticoagulants: Secondary | ICD-10-CM | POA: Diagnosis not present

## 2020-06-03 DIAGNOSIS — I495 Sick sinus syndrome: Secondary | ICD-10-CM | POA: Insufficient documentation

## 2020-06-03 DIAGNOSIS — K219 Gastro-esophageal reflux disease without esophagitis: Secondary | ICD-10-CM | POA: Insufficient documentation

## 2020-06-03 DIAGNOSIS — F419 Anxiety disorder, unspecified: Secondary | ICD-10-CM | POA: Diagnosis not present

## 2020-06-03 DIAGNOSIS — I10 Essential (primary) hypertension: Secondary | ICD-10-CM | POA: Insufficient documentation

## 2020-06-03 DIAGNOSIS — R0609 Other forms of dyspnea: Secondary | ICD-10-CM | POA: Diagnosis not present

## 2020-06-03 DIAGNOSIS — Z79899 Other long term (current) drug therapy: Secondary | ICD-10-CM | POA: Diagnosis not present

## 2020-06-03 DIAGNOSIS — Z950 Presence of cardiac pacemaker: Secondary | ICD-10-CM

## 2020-06-03 DIAGNOSIS — Z7952 Long term (current) use of systemic steroids: Secondary | ICD-10-CM | POA: Diagnosis not present

## 2020-06-03 DIAGNOSIS — I4892 Unspecified atrial flutter: Secondary | ICD-10-CM | POA: Insufficient documentation

## 2020-06-03 DIAGNOSIS — R2689 Other abnormalities of gait and mobility: Secondary | ICD-10-CM | POA: Diagnosis not present

## 2020-06-03 DIAGNOSIS — Z87891 Personal history of nicotine dependence: Secondary | ICD-10-CM | POA: Diagnosis not present

## 2020-06-03 DIAGNOSIS — Z95818 Presence of other cardiac implants and grafts: Secondary | ICD-10-CM

## 2020-06-03 HISTORY — DX: Presence of cardiac pacemaker: Z95.0

## 2020-06-03 HISTORY — PX: INSERT / REPLACE / REMOVE PACEMAKER: SUR710

## 2020-06-03 HISTORY — DX: Sick sinus syndrome: I49.5

## 2020-06-03 HISTORY — PX: PACEMAKER IMPLANT: EP1218

## 2020-06-03 SURGERY — PACEMAKER IMPLANT

## 2020-06-03 MED ORDER — LIDOCAINE HCL (PF) 1 % IJ SOLN
INTRAMUSCULAR | Status: DC | PRN
  Administered 2020-06-03: 50 mL

## 2020-06-03 MED ORDER — HEPARIN (PORCINE) IN NACL 1000-0.9 UT/500ML-% IV SOLN
INTRAVENOUS | Status: AC
  Filled 2020-06-03: qty 500

## 2020-06-03 MED ORDER — SODIUM CHLORIDE 0.9 % IV SOLN
INTRAVENOUS | Status: AC
  Filled 2020-06-03: qty 2

## 2020-06-03 MED ORDER — PANTOPRAZOLE SODIUM 40 MG PO TBEC
40.0000 mg | DELAYED_RELEASE_TABLET | Freq: Every day | ORAL | Status: DC
  Filled 2020-06-03: qty 1

## 2020-06-03 MED ORDER — TAMSULOSIN HCL 0.4 MG PO CAPS
0.4000 mg | ORAL_CAPSULE | Freq: Every day | ORAL | Status: DC
  Filled 2020-06-03: qty 1

## 2020-06-03 MED ORDER — CEFAZOLIN SODIUM-DEXTROSE 1-4 GM/50ML-% IV SOLN
1.0000 g | Freq: Two times a day (BID) | INTRAVENOUS | Status: AC
  Administered 2020-06-03 – 2020-06-04 (×2): 1 g via INTRAVENOUS
  Filled 2020-06-03 (×2): qty 50

## 2020-06-03 MED ORDER — LIDOCAINE HCL 1 % IJ SOLN
INTRAMUSCULAR | Status: AC
  Filled 2020-06-03: qty 60

## 2020-06-03 MED ORDER — TRAZODONE HCL 100 MG PO TABS
100.0000 mg | ORAL_TABLET | Freq: Every day | ORAL | Status: DC
  Administered 2020-06-03: 100 mg via ORAL
  Filled 2020-06-03: qty 1

## 2020-06-03 MED ORDER — CEFAZOLIN SODIUM-DEXTROSE 2-4 GM/100ML-% IV SOLN
2.0000 g | INTRAVENOUS | Status: AC
  Administered 2020-06-03: 2 g via INTRAVENOUS

## 2020-06-03 MED ORDER — DILTIAZEM HCL 60 MG PO TABS
120.0000 mg | ORAL_TABLET | Freq: Every day | ORAL | Status: DC
  Administered 2020-06-03: 120 mg via ORAL
  Filled 2020-06-03 (×2): qty 2

## 2020-06-03 MED ORDER — SODIUM CHLORIDE 0.9 % IV SOLN: 80.0000 mg | INTRAVENOUS | Status: DC

## 2020-06-03 MED ORDER — CEFAZOLIN SODIUM-DEXTROSE 2-4 GM/100ML-% IV SOLN
INTRAVENOUS | Status: AC
  Filled 2020-06-03: qty 100

## 2020-06-03 MED ORDER — ONDANSETRON HCL 4 MG/2ML IJ SOLN: 4.0000 mg | Freq: Four times a day (QID) | INTRAMUSCULAR | Status: DC | PRN

## 2020-06-03 MED ORDER — MIRTAZAPINE 7.5 MG PO TABS
7.5000 mg | ORAL_TABLET | Freq: Every day | ORAL | Status: DC
  Administered 2020-06-03: 7.5 mg via ORAL
  Filled 2020-06-03 (×2): qty 1

## 2020-06-03 MED ORDER — SODIUM CHLORIDE 0.9 % IV SOLN: INTRAVENOUS | Status: DC

## 2020-06-03 MED ORDER — HEPARIN (PORCINE) IN NACL 1000-0.9 UT/500ML-% IV SOLN
INTRAVENOUS | Status: DC | PRN
  Administered 2020-06-03: 500 mL

## 2020-06-03 MED ORDER — HYDROCHLOROTHIAZIDE 25 MG PO TABS
12.5000 mg | ORAL_TABLET | Freq: Every day | ORAL | Status: DC
  Filled 2020-06-03: qty 1

## 2020-06-03 MED ORDER — ACETAMINOPHEN 325 MG PO TABS: 325.0000 mg | ORAL_TABLET | ORAL | Status: DC | PRN

## 2020-06-03 MED ORDER — CHLORHEXIDINE GLUCONATE 4 % EX LIQD
4.0000 "application " | Freq: Once | CUTANEOUS | Status: DC
  Filled 2020-06-03: qty 60

## 2020-06-03 MED ORDER — ATORVASTATIN CALCIUM 10 MG PO TABS
10.0000 mg | ORAL_TABLET | Freq: Every day | ORAL | Status: DC
  Filled 2020-06-03: qty 1

## 2020-06-03 MED ORDER — YOU HAVE A PACEMAKER BOOK
Freq: Once | Status: AC
  Filled 2020-06-03: qty 1

## 2020-06-03 SURGICAL SUPPLY — 7 items
CABLE SURGICAL S-101-97-12 (CABLE) ×2 IMPLANT
LEAD TENDRIL MRI 52CM LPA1200M (Lead) ×2 IMPLANT
LEAD TENDRIL MRI 58CM LPA1200M (Lead) ×2 IMPLANT
PACEMAKER ASSURITY DR-RF (Pacemaker) ×2 IMPLANT
PAD PRO RADIOLUCENT 2001M-C (PAD) ×2 IMPLANT
SHEATH 8FR PRELUDE SNAP 13 (SHEATH) ×4 IMPLANT
TRAY PACEMAKER INSERTION (PACKS) ×2 IMPLANT

## 2020-06-03 NOTE — Interval H&P Note (Signed)
Stephen Cuevas has presented today for surgery, with the diagnosis of tachy/brady syndrome.  The various methods of treatment have been discussed with the patient and family. After consideration of risks, benefits and other options for treatment, the patient has consented to  Procedure(s): Pacemaker implant as a surgical intervention .  Risks include but not limited to bleeding, tamponade, infection, pneumothorax, among others. The patient's history has been reviewed, patient examined, no change in status, stable for surgery.  I have reviewed the patient's chart and labs.  Questions were answered to the patient's satisfaction.    Newell Frater Curt Bears, MD 06/03/2020 10:57 AM

## 2020-06-03 NOTE — Progress Notes (Signed)
PHARMACY NOTE:  ANTIMICROBIAL RENAL DOSAGE ADJUSTMENT  Current antimicrobial regimen includes a mismatch between antimicrobial dosage and estimated renal function.  As per policy approved by the Pharmacy & Therapeutics and Medical Executive Committees, the antimicrobial dosage will be adjusted accordingly.  Current antimicrobial dosage:  Cefazolin  1 gm IV q6h x 3  Indication: surgical prophylaxis  Renal Function:  Estimated Creatinine Clearance: 27.1 mL/min (A) (by C-G formula based on SCr of 1.57 mg/dL (H)). []      On intermittent HD, scheduled: []      On CRRT    Antimicrobial dosage has been changed to:  Cefazolin 1gm IV q12h x 2  Additional comments:   Tinya Cadogan A. Levada Dy, PharmD, BCPS, FNKF Clinical Pharmacist [place] Please utilize Amion for appropriate phone number to reach the unit pharmacist ([place])   06/03/2020 5:26 PM

## 2020-06-03 NOTE — Discharge Summary (Addendum)
ELECTROPHYSIOLOGY PROCEDURE DISCHARGE SUMMARY    Patient ID: Stephen Cuevas,  MRN: [id number], DOB/AGE: 1926/04/03 86 y.o.  Admit date: 06/03/2020 Discharge date: 06/04/2020  Primary Care Physician: [name], DO  Primary Cardiologist: Dr. [name] Electrophysiologist: Dr Curt Bears   Primary Discharge Diagnosis:  Tachy-brady syndrome status post pacemaker implantation this admission  Secondary Discharge Diagnosis:  Paroxysmal atrial fibrillation DOE  No Known Allergies   Procedures This Admission:  1.  Implantation of a St. Jude dual chamber PPM on 06/03/2020 by [name].  The patient received a St. Jude model number [id number] PPM with model number [id number] right atrial lead and [id number] right ventricular lead. There were no immediate post procedure complications. 2.  CXR on 06/04/20 demonstrated no pneumothorax status post device implantation. (formal read available in PACS but not Epic at time of note; confirmed with radiologist)  Brief HPI: E Zarin Knupp is a 86 y.o. male was referred to electrophysiology in the outpatient setting for  consideration of PPM implantation.  Past medical history includes above.  The patient has had symptomatic bradycardia without reversible causes identified.  Risks, benefits, and alternatives to PPM implantation were reviewed with the patient who wished to proceed.   Hospital Course:  The patient was admitted and underwent implantation of a St. Jude dual chamber PPM with details as outlined above.  He was monitored on telemetry overnight which demonstrated appropriate pacing.  Left chest was without hematoma or ecchymosis.  The device was interrogated and found to be functioning normally.  CXR was obtained and demonstrated no pneumothorax status post device implantation.  Wound care, arm mobility, and restrictions were reviewed with the patient.  The patient was examined and considered stable for discharge to home.    Regarding blood  thinner therapy, they were instructed to resume their eliquis on 06/05/2020 am. I confirmed with his son that he is taking 2.5 mg BID, NOT 5. This is based on recent creatinines of 1.5-1.7 range.    Physical Exam: Vitals:   06/03/20 2132 06/04/20 0517 06/04/20 0739 06/04/20 1139  BP: (!) 107/53 (!) 142/66 (!) 140/67 (!) 143/70  Pulse: 79 60 50 (!) 41  Resp: 16 18 16 16   Temp:  98 F (36.7 C) 97.6 F (36.4 C) 98 F (36.7 C)  TempSrc:  Oral Oral Oral  SpO2: 97% 94% 92% 96%  Weight:  72.2 kg    Height:        GEN- The patient is well appearing, alert and oriented x 3 today.   HEENT: normocephalic, atraumatic; sclera clear, conjunctiva pink; hearing intact; oropharynx clear; neck supple, no JVP Lymph- no cervical lymphadenopathy Lungs- Clear to ausculation bilaterally, normal work of breathing.  No wheezes, rales, rhonchi Heart- Regular rate and rhythm, no murmurs, rubs or gallops, PMI not laterally displaced GI- soft, non-tender, non-distended, bowel sounds present, no hepatosplenomegaly Extremities- no clubbing, cyanosis, or edema; DP/PT/radial pulses 2+ bilaterally MS- no significant deformity or atrophy Skin- warm and dry, no rash or lesion, left chest without hematoma/ecchymosis Psych- euthymic mood, full affect Neuro- strength and sensation are intact   Labs:   Lab Results  Component Value Date   WBC 12.1 (H) 06/02/2020   HGB 12.2 (L) 06/02/2020   HCT 38.3 (L) 06/02/2020   MCV 91.4 06/02/2020   PLT 227 06/02/2020    Recent Labs  Lab 06/02/20 1300  NA 139  K 4.2  CL 103  CO2 21*  BUN 19  CREATININE 1.57*  CALCIUM 9.1  GLUCOSE 124*    Discharge Medications:  Allergies as of 06/04/2020   No Known Allergies      Medication List     TAKE these medications    apixaban 2.5 MG Tabs tablet Commonly known as: ELIQUIS Take 1 tablet (2.5 mg total) by mouth 2 (two) times daily. Start taking on: June 05, 2020 What changed: how much to take   atorvastatin 10  MG tablet Commonly known as: LIPITOR Take 10 mg by mouth daily with breakfast.   diltiazem 120 MG tablet Commonly known as: CARDIZEM Take 120 mg by mouth daily.   hydrochlorothiazide 12.5 MG tablet Commonly known as: HYDRODIURIL Take 12.5 mg by mouth daily.   ICAPS AREDS 2 PO Take 2 tablets by mouth 2 (two) times daily.   MELATONEX PO Take by mouth.   mirtazapine 7.5 MG tablet Commonly known as: REMERON Take 7.5 mg by mouth at bedtime.   omeprazole 20 MG capsule Commonly known as: PRILOSEC Take 20 mg by mouth daily with breakfast.   PREDNISONE PO Take 3 mg by mouth daily with breakfast.   tamsulosin 0.4 MG Caps capsule Commonly known as: FLOMAX Take 0.4 mg by mouth daily.   traZODone 100 MG tablet Commonly known as: DESYREL Take 100 mg by mouth at bedtime.        Disposition:     Follow-up Information     Constance Haw, MD Follow up on 09/07/2020.   Specialty: Cardiology Why: at 315 pm for post pacemaker follow up Contact information: [address] [place] [address] [place] Follow up on 06/17/2020.   Why: at 2 pm for post pacemaker wound check Contact information: [place] [address] [phone]                Duration of Discharge Encounter: Greater than 30 minutes including physician time.  Signed, Shirley Friar, PA-C  06/04/2020 12:44 PM  I have seen and examined this patient with Oda Kilts.  Agree with above, note added to reflect my findings.  On exam, RRR, no murmurs.  She is now status post St. Jude dual chamber pacemaker for tachy/brady syndrome.  Device functioning appropriately.  Chest x-ray and interrogation without issue.  Plan for discharge today with follow-up in device clinic.  Brendan Gadson M. Tyee Vandevoorde MD 06/04/2020 2:04 PM

## 2020-06-04 ENCOUNTER — Ambulatory Visit (HOSPITAL_COMMUNITY): Payer: Medicare Other

## 2020-06-04 ENCOUNTER — Encounter (HOSPITAL_COMMUNITY): Payer: Self-pay | Admitting: Cardiology

## 2020-06-04 DIAGNOSIS — I495 Sick sinus syndrome: Secondary | ICD-10-CM

## 2020-06-04 DIAGNOSIS — I480 Paroxysmal atrial fibrillation: Secondary | ICD-10-CM | POA: Diagnosis not present

## 2020-06-04 DIAGNOSIS — R0609 Other forms of dyspnea: Secondary | ICD-10-CM | POA: Diagnosis not present

## 2020-06-04 DIAGNOSIS — I10 Essential (primary) hypertension: Secondary | ICD-10-CM | POA: Diagnosis not present

## 2020-06-04 MED ORDER — APIXABAN 2.5 MG PO TABS: 2.5000 mg | ORAL_TABLET | Freq: Two times a day (BID) | ORAL | 3 refills | Status: DC

## 2020-06-04 MED FILL — Lidocaine HCl Local Inj 1%: INTRAMUSCULAR | Qty: 60 | Status: AC

## 2020-06-04 MED FILL — Gentamicin Sulfate Inj 40 MG/ML: INTRAMUSCULAR | Qty: 80 | Status: AC

## 2020-06-04 NOTE — Discharge Instructions (Signed)
After Your Pacemaker   . You have a St. Jude Pacemaker  ACTIVITY . Do not lift your arm above shoulder height for 1 week after your procedure. After 7 days, you may progress as below.     Thursday June 10, 2020  Friday June 11, 2020 Saturday June 12, 2020 Sunday June 13, 2020   . Do not lift, push, pull, or carry anything over 10 pounds with the affected arm until 6 weeks (Thursday July 15, 2020 ) after your procedure.   . Do NOT DRIVE until you have been seen for your wound check, or as long as instructed by your healthcare provider.   . Ask your healthcare provider when you can go back to work   INCISION/Dressing . Please resume your Eliquis tomorrow morning, 06/05/2020.  . Monitor your Pacemaker site for redness, swelling, and drainage. Call the device clinic at [phone] if you experience these symptoms or fever/chills.  . If your incision is sealed with Steri-strips or staples, you may shower 10 days after your procedure or when told by your provider. Do not remove the steri-strips or let the shower hit directly on your site. You may wash around your site with soap and water.    Marland Kitchen Avoid lotions, ointments, or perfumes over your incision until it is well-healed.  . You may use a hot tub or a pool AFTER your wound check appointment if the incision is completely closed.  Marland Kitchen PAcemaker Alerts:  Some alerts are vibratory and others beep. These are NOT emergencies. Please call our office to let us know. If this occurs at night or on weekends, it can wait until the next business day. Send a remote transmission.  . If your device is capable of reading fluid status (for heart failure), you will be offered monthly monitoring to review this with you.   DEVICE MANAGEMENT . Remote monitoring is used to monitor your pacemaker from home. This monitoring is scheduled every 91 days by our office. It allows [place] to keep an eye on the functioning of your device to ensure it is working  properly. You will routinely see your Electrophysiologist annually (more often if necessary).   . You should receive your ID card for your new device in 4-8 weeks. Keep this card with you at all times once received. Consider wearing a medical alert bracelet or necklace.  . Your Pacemaker may be MRI compatible. This will be discussed at your next office visit/wound check.  You should avoid contact with strong electric or magnetic fields.    Do not use amateur (ham) radio equipment or electric (arc) welding torches. MP3 player headphones with magnets should not be used. Some devices are safe to use if held at least 12 inches (30 cm) from your Pacemaker. These include power tools, lawn mowers, and speakers. If you are unsure if something is safe to use, ask your health care provider.   When using your cell phone, hold it to the ear that is on the opposite side from the Pacemaker. Do not leave your cell phone in a pocket over the Pacemaker.   You may safely use electric blankets, heating pads, computers, and microwave ovens.  Call the office right away if:  You have chest pain.  You feel more short of breath than you have felt before.  You feel more light-headed than you have felt before.  Your incision starts to open up.  This information is not intended to replace advice given to you by  your health care provider. Make sure you discuss any questions you have with your health care provider.  

## 2020-06-17 ENCOUNTER — Other Ambulatory Visit: Payer: Self-pay

## 2020-06-17 ENCOUNTER — Ambulatory Visit (INDEPENDENT_AMBULATORY_CARE_PROVIDER_SITE_OTHER): Payer: Medicare Other | Admitting: Emergency Medicine

## 2020-06-17 DIAGNOSIS — I495 Sick sinus syndrome: Secondary | ICD-10-CM | POA: Diagnosis not present

## 2020-06-18 LAB — CUP PACEART INCLINIC DEVICE CHECK
Battery Remaining Longevity: 138 mo
Battery Voltage: 3.14 V
Brady Statistic RA Percent Paced: 21 %
Brady Statistic RV Percent Paced: 12 %
Date Time Interrogation Session: 20210805142431
Implantable Lead Implant Date: 20210722
Implantable Lead Implant Date: 20210722
Implantable Lead Location: 753859
Implantable Lead Location: 753860
Implantable Pulse Generator Implant Date: 20210722
Lead Channel Impedance Value: 425 Ohm
Lead Channel Impedance Value: 612.5 Ohm
Lead Channel Pacing Threshold Amplitude: 0.5 V
Lead Channel Pacing Threshold Amplitude: 0.625 V
Lead Channel Pacing Threshold Pulse Width: 0.4 ms
Lead Channel Pacing Threshold Pulse Width: 0.4 ms
Lead Channel Sensing Intrinsic Amplitude: 2.6 mV
Lead Channel Sensing Intrinsic Amplitude: 9.2 mV
Lead Channel Setting Pacing Amplitude: 0.875
Lead Channel Setting Pacing Amplitude: 1.5 V
Lead Channel Setting Pacing Pulse Width: 0.4 ms
Lead Channel Setting Sensing Sensitivity: 2 mV
Pulse Gen Model: 2272
Pulse Gen Serial Number: 3851047

## 2020-06-18 NOTE — Progress Notes (Signed)
Wound check appointment. Steri-strips removed. Wound without redness or edema. Incision edges approximated, wound well healed. Device check completed with industry.  Normal device function. Thresholds, sensing, and impedances consistent with implant measurements. Device auto capture programmed on for extra safety margin until 3 month visit. Histogram distribution appropriate for patient and level of activity. 583 AMS episodes.  Longest episde lasting 1 hour 24 minutes with peak A rate of 640 on 8/2. Most recent 8/5 1237 lasting 22min 48 sec.,peak A rate 384. Available EGMS reviewed.   AT/ AF Burden 4.3%, patient is on Eliquis for [place].  No high ventricular rates noted. Patient educated about wound care, arm mobility, lifting restrictions. ROV with [name] on 09/07/20.  Patient is enrolled in remote monitoring with next scheduled transmission on 09/02/20, patient receives cardiology care from Dr. [name] with plan for management of his device to be transferred there.

## 2020-07-29 ENCOUNTER — Encounter: Payer: Self-pay | Admitting: Cardiology

## 2020-07-29 ENCOUNTER — Other Ambulatory Visit: Payer: Self-pay

## 2020-07-29 ENCOUNTER — Ambulatory Visit: Payer: Medicare Other | Admitting: Cardiology

## 2020-07-29 VITALS — BP 126/67 | HR 86 | Resp 16 | Ht 66.0 in | Wt 162.0 lb

## 2020-07-29 DIAGNOSIS — Z950 Presence of cardiac pacemaker: Secondary | ICD-10-CM

## 2020-07-29 DIAGNOSIS — I495 Sick sinus syndrome: Secondary | ICD-10-CM

## 2020-07-29 DIAGNOSIS — I480 Paroxysmal atrial fibrillation: Secondary | ICD-10-CM

## 2020-07-29 DIAGNOSIS — I442 Atrioventricular block, complete: Secondary | ICD-10-CM

## 2020-07-29 NOTE — Progress Notes (Signed)
Primary Physician/Referring:  [name], DO  Patient ID: Stephen Cuevas, male    DOB: May 27, 1926, 86 y.o.   MRN: [id number]  Chief Complaint  Patient presents with  . Atrial Fibrillation  . Heart Block  . Follow-up    6 weeks    HPI:    Stephen Cuevas  is a 86 y.o. Caucasian male with hyperlipidemia, hypertension, sick sinus syndrome with paroxysmal atrial fibrillation, complete heart block who underwent permanent pacemaker implantation on 06/04/2020 for marked dyspnea, fatigue and dizziness presents here now for follow-up.  He is accompanied by son and states that he feels the best he has in quite a while and he has noticed marked improvement in overall wellbeing. He has not had any further dizziness, dyspnea has resolved. He has resumed all his activities.  Past Medical History:  Diagnosis Date  . Ankle edema   . Anxiety   . Dysrhythmia    a-fib/flutter- on eliquis  . GERD (gastroesophageal reflux disease)    no treatment  . Hard of hearing   . Heart block AV complete ([facility]) 07/31/2020  . Hypertension   . Kidney stone   . Presence of permanent cardiac pacemaker 06/03/2020  . Spinal stenosis   . Tachy-brady syndrome ([facility]) 06/03/2020   Past Surgical History:  Procedure Laterality Date  . addnoid    . CYSTOSCOPY W/ URETEROSCOPY  1980  . INSERT / REPLACE / REMOVE PACEMAKER  06/03/2020  . KNEE SURGERY    . NAILBED REPAIR Left 06/27/2016   Procedure: NAILBED biopsy left thumb;  Surgeon: Daryll Brod, MD;  Location: [facility];  Service: Orthopedics;  Laterality: Left;  FAB  . PACEMAKER IMPLANT N/A 06/03/2020   Procedure: PACEMAKER IMPLANT;  Surgeon: Constance Haw, MD;  Location: [facility];  Service: Cardiovascular;  Laterality: N/A;  . TONSILLECTOMY     Family History  Problem Relation Age of Onset  . Heart failure Father   . Heart disease Father     Social History   Tobacco Use  . Smoking status: Former Smoker    Packs/day: 1.50     Years: 20.00    Pack years: 30.00    Types: Cigarettes, Pipe, Cigars    Quit date: 03/13/1956    Years since quitting: 64.4  . Smokeless tobacco: Never Used  Substance Use Topics  . Alcohol use: Yes    Comment: daily   Marital Status: Widowed ROS  Review of Systems  Constitutional: Negative for malaise/fatigue.  Cardiovascular: Positive for leg swelling (occasional). Negative for chest pain, dyspnea on exertion, near-syncope and orthopnea.  Gastrointestinal: Negative for melena.  Neurological: Positive for dizziness (rare).   Objective  Blood pressure 126/67, pulse 86, resp. rate 16, height '5\' 6"'  (1.676 m), weight 162 lb (73.5 kg), SpO2 96 %.  Vitals with BMI 07/29/2020 06/04/2020 06/04/2020  Height '5\' 6"'  - -  Weight 162 lbs - -  BMI 04.88 - -  Systolic 891 694 503  Diastolic 67 70 67  Pulse 86 41 50     Physical Exam HENT:     Head: Atraumatic.  Cardiovascular:     Rate and Rhythm: Normal rate and regular rhythm.     Pulses: Intact distal pulses.          Carotid pulses are 2+ on the right side and 2+ on the left side.      Popliteal pulses are 2+ on the right side and 2+ on the left side.  Dorsalis pedis pulses are 2+ on the right side and 2+ on the left side.       Posterior tibial pulses are 1+ on the right side and 1+ on the left side.     Heart sounds: S1 normal and S2 normal. Murmur heard.  Early systolic murmur is present with a grade of 2/6 at the upper right sternal border.  No gallop.      Comments: No JVD.  Trace bilateral leg edema Pulmonary:     Effort: Pulmonary effort is normal.     Breath sounds: Normal breath sounds.     Comments: Pacemaker/ICD site noted  in the left infraclavicular fossa.   Abdominal:     General: Bowel sounds are normal.     Palpations: Abdomen is soft.    Laboratory examination:   CMP Latest Ref Rng & Units 06/02/2020 06/22/2017 06/22/2016  Glucose 70 - 99 mg/dL 124(H) - 142(H)  BUN 8 - 23 mg/dL 19 - 15  Creatinine 0.61 -  1.24 mg/dL 1.57(H) 1.30(H) 1.03  Sodium 135 - 145 mmol/L 139 - 135  Potassium 3.5 - 5.1 mmol/L 4.2 - 4.6  Chloride 98 - 111 mmol/L 103 - 101  CO2 22 - 32 mmol/L 21(L) - 25  Calcium 8.9 - 10.3 mg/dL 9.1 - 9.5  Total Protein 6.0 - 8.3 g/dL - - -  Total Bilirubin 0.3 - 1.2 mg/dL - - -  Alkaline Phos 39 - 117 U/L - - -  AST 0 - 37 U/L - - -  ALT 0 - 53 U/L - - -   CBC Latest Ref Rng & Units 06/02/2020 08/16/2018 01/26/2015  WBC 4.0 - 10.5 K/uL 12.1(H) 8.7 8.0  Hemoglobin 13.0 - 17.0 g/dL 12.2(L) 12.6(L) 13.6  Hematocrit 39 - 52 % 38.3(L) 39.2 42.2  Platelets 150 - 400 K/uL 227 175 188   External labs:  02/16/2020: Serum glucose 105 mg, BUN 22, creatinine 1.7, EGFR 37.9 mL, potassium 4.7. Hb 12.3/HCT 40.9, platelets 190. Total cholesterol 162, triglycerides 70, HDL 63, LDL 85.  Non-HDL cholesterol 99. PSA normal   Medications and allergies  No Known Allergies   Current Outpatient Medications on File Prior to Visit  Medication Sig Dispense Refill  . apixaban (ELIQUIS) 2.5 MG TABS tablet Take 1 tablet (2.5 mg total) by mouth 2 (two) times daily. 180 tablet 3  . atorvastatin (LIPITOR) 10 MG tablet Take 10 mg by mouth daily with breakfast.   12  . diltiazem (CARDIZEM) 120 MG tablet Take 120 mg by mouth daily.    . hydrochlorothiazide (HYDRODIURIL) 12.5 MG tablet Take 12.5 mg by mouth daily.    . Melatonin-Pyridoxine (MELATONEX PO) Take by mouth.    . mirtazapine (REMERON) 7.5 MG tablet Take 7.5 mg by mouth at bedtime.     . Multiple Vitamins-Minerals (ICAPS AREDS 2 PO) Take 2 tablets by mouth 2 (two) times daily.    Marland Kitchen omeprazole (PRILOSEC) 20 MG capsule Take 20 mg by mouth daily with breakfast.     . PREDNISONE PO Take 3 mg by mouth daily with breakfast.   99  . tamsulosin (FLOMAX) 0.4 MG CAPS capsule Take 0.4 mg by mouth daily.  12   No current facility-administered medications on file prior to visit.    Lisinopril on hold since 02/16/19 per PCP  Radiology:   Chest x-ray PA and  lateral view 02/16/2020: Mild hyperextension, patchy right basilar infiltrate, borderline cardiomegaly, atherosclerosis, thoracic levoscoliosis and spondylosis.  Cardiac Studies:   Echocardiogram  02/26/2020:  Normal LV systolic function with visual EF 60-65%. Left ventricle cavity is normal in size. Mild left ventricular hypertrophy. Normal global wall  motion. Doppler evidence of grade II diastolic dysfunction. Calculated EF 66%.  Left atrial cavity is severely dilated, 49m/m2.  Right atrial cavity is dilated.  No evidence of aortic stenosis. Mild aortic regurgitation.  Mild to moderate mitral regurgitation.  Moderate tricuspid regurgitation. Moderate pulmonary hypertension. RVSP measures 51 mmHg.  IVC is dilated with a respiratory response of <50%.  No prior study for comparison.  Event monitor 04/27/2020 through 05/17/2020: Patient had paroxysmal episodes of atrial fibrillation with 4% atrial fibrillation burden. Predominant rhythm was sinus rhythm with nonconducted P waves and 1: 1 Mobitz 2 AV  block at 10:21 PM on 05/10/2020. Patient also had a 3.4-second ventricular standstill at 10:35 PM on 05/04/2020,   EKG  EKG 06/02/2020: Underlying sinus bradycardia at rate of 30 bpm with complete heart block and junctional escape, frequent PACs in bigeminal pattern that appeared to be conducted.  Left axis deviation, left anterior fascicular block.  Right bundle branch block.   EKG 03/29/2020: Sinus rhythm with first-degree block at rate of 83 bpm, leftward enlargement, left axis deviation, left intrafascicular block.  Right bundle branch block.  Bifascicular block.  Low-voltage complexes.  Nonspecific T abnormality.  EKG 03/22/2020, PCP EKG.  Sinus tachycardia at rate of 100 bpm with 2: 1 AV conduction, Mobitz 2 AV block 1: 1.  Left axis deviation, left anterior fascicular block.  Right bundle branch block.  02/20/2020: Sinus bradycardia with first-degree AV block at the rate of 52 bpm, left axis  deviation, left anterior fascicular block.  Right bundle branch block.  Trifascicular block.  Low-voltage complexes.  No evidence of ischemia.  02/20/2020: Atrial fibrillation with controlled ventricular response at rate of 49 bpm, left axis deviation, left anterior fascicular block.  Right bundle branch block.  Nonspecific T abnormality.    Assessment     ICD-10-CM   1. Tachy-brady syndrome (HCC)  I49.5   2. Paroxysmal atrial fibrillation (HCC)  I48.0   3. Heart block AV complete (HCC)  I44.2   4. Pacemaker STulareMRI Dual Chamber pacemaker 06/03/2018  Z95.0     CHA2DS2-VASc Score is 3.  Yearly risk of stroke: 3.2% (A, HTN).  Score of 1=0.6; 2=2.2; 3=3.2; 4=4.8; 5=7.2; 6=9.8; 7=>9.8) -(CHF; HTN; vasc disease DM,  Male = 1; Age <65 =0; 65-74 = 1,  >75 =2; stroke/embolism= 2).    No orders of the defined types were placed in this encounter.   Medications Discontinued During This Encounter  Medication Reason  . traZODone (DESYREL) 100 MG tablet Discontinued by provider    Recommendations:   Stephen Cuevas is a 89y.o. Caucasianwith hyperlipidemia, hypertension, sick sinus syndrome with paroxysmal atrial fibrillation, complete heart block who underwent permanent pacemaker implantation on 06/04/2020 for marked dyspnea, fatigue and dizziness presents here now for follow-up. He is feeling the best he has in quite a while and has not had any further episodes of significant dizziness. Fatigue is improved.  His pacemaker site has healed well. No changes in the medications were done today. He is tolerating anticoagulation well. I will see him back in 3 months for pacemaker interrogation and follow-up of PAF.   JAdrian Prows MD, F[facility] Of [facility]9/18/2021, 10:27 AM Office: [phone]

## 2020-07-31 ENCOUNTER — Encounter: Payer: Self-pay | Admitting: Cardiology

## 2020-07-31 DIAGNOSIS — I442 Atrioventricular block, complete: Secondary | ICD-10-CM

## 2020-07-31 HISTORY — DX: Atrioventricular block, complete: I44.2

## 2020-08-10 ENCOUNTER — Telehealth: Payer: Self-pay | Admitting: Cardiology

## 2020-08-10 NOTE — Telephone Encounter (Signed)
error 

## 2020-09-02 ENCOUNTER — Telehealth: Payer: Self-pay

## 2020-09-02 ENCOUNTER — Ambulatory Visit (INDEPENDENT_AMBULATORY_CARE_PROVIDER_SITE_OTHER): Payer: Medicare Other

## 2020-09-02 DIAGNOSIS — I442 Atrioventricular block, complete: Secondary | ICD-10-CM

## 2020-09-02 LAB — CUP PACEART REMOTE DEVICE CHECK
Battery Remaining Longevity: 101 mo
Battery Remaining Percentage: 95.5 %
Battery Voltage: 3.04 V
Brady Statistic AP VP Percent: 2.9 %
Brady Statistic AP VS Percent: 20 %
Brady Statistic AS VP Percent: 5.5 %
Brady Statistic AS VS Percent: 71 %
Brady Statistic RA Percent Paced: 19 %
Brady Statistic RV Percent Paced: 11 %
Date Time Interrogation Session: 20211021020014
Implantable Lead Implant Date: 20210722
Implantable Lead Implant Date: 20210722
Implantable Lead Location: 753859
Implantable Lead Location: 753860
Implantable Pulse Generator Implant Date: 20210722
Lead Channel Impedance Value: 400 Ohm
Lead Channel Impedance Value: 630 Ohm
Lead Channel Pacing Threshold Amplitude: 2.125 V
Lead Channel Pacing Threshold Amplitude: 3.25 V
Lead Channel Pacing Threshold Pulse Width: 0.4 ms
Lead Channel Pacing Threshold Pulse Width: 0.4 ms
Lead Channel Sensing Intrinsic Amplitude: 2.7 mV
Lead Channel Sensing Intrinsic Amplitude: 9.1 mV
Lead Channel Setting Pacing Amplitude: 3.625
Lead Channel Setting Pacing Amplitude: 5 V
Lead Channel Setting Pacing Pulse Width: 0.4 ms
Lead Channel Setting Sensing Sensitivity: 2 mV
Pulse Gen Model: 2272
Pulse Gen Serial Number: 3851047

## 2020-09-02 NOTE — Telephone Encounter (Signed)
Merlin scheduled remote report received 09/02/20. RV auto-capture increased to 5 V. Called patients son on file Stephen Cuevas), states he lives in [place] and he will have to call family to arrange patient to come into office. States after he is able to get someone to bring patient he will call device clinic to make apt.  Patient has known PAF, 9%, auto-capture is on.

## 2020-09-02 NOTE — Telephone Encounter (Signed)
Spoke to Karna Christmas, states patient has apt. Next Tuesday 09/07/20.   Terri [phone]

## 2020-09-05 ENCOUNTER — Emergency Department (HOSPITAL_COMMUNITY): Payer: Medicare Other

## 2020-09-05 ENCOUNTER — Encounter (HOSPITAL_COMMUNITY): Payer: Self-pay | Admitting: Internal Medicine

## 2020-09-05 ENCOUNTER — Other Ambulatory Visit: Payer: Self-pay

## 2020-09-05 ENCOUNTER — Inpatient Hospital Stay (HOSPITAL_COMMUNITY)
Admission: EM | Admit: 2020-09-05 | Discharge: 2020-09-09 | DRG: 563 | Disposition: A | Discharge status: Skilled Nursing Facility | Payer: Medicare Other | Attending: Internal Medicine | Admitting: Internal Medicine

## 2020-09-05 DIAGNOSIS — W01198A Fall on same level from slipping, tripping and stumbling with subsequent striking against other object, initial encounter: Secondary | ICD-10-CM | POA: Diagnosis present

## 2020-09-05 DIAGNOSIS — S42024A Nondisplaced fracture of shaft of right clavicle, initial encounter for closed fracture: Principal | ICD-10-CM | POA: Diagnosis present

## 2020-09-05 DIAGNOSIS — N1831 Chronic kidney disease, stage 3a: Secondary | ICD-10-CM | POA: Diagnosis present

## 2020-09-05 DIAGNOSIS — I1310 Hypertensive heart and chronic kidney disease without heart failure, with stage 1 through stage 4 chronic kidney disease, or unspecified chronic kidney disease: Secondary | ICD-10-CM | POA: Diagnosis present

## 2020-09-05 DIAGNOSIS — N179 Acute kidney failure, unspecified: Secondary | ICD-10-CM | POA: Diagnosis present

## 2020-09-05 DIAGNOSIS — Z87891 Personal history of nicotine dependence: Secondary | ICD-10-CM

## 2020-09-05 DIAGNOSIS — I4892 Unspecified atrial flutter: Secondary | ICD-10-CM | POA: Diagnosis present

## 2020-09-05 DIAGNOSIS — W19XXXA Unspecified fall, initial encounter: Secondary | ICD-10-CM | POA: Diagnosis not present

## 2020-09-05 DIAGNOSIS — Z7901 Long term (current) use of anticoagulants: Secondary | ICD-10-CM

## 2020-09-05 DIAGNOSIS — Z66 Do not resuscitate: Secondary | ICD-10-CM | POA: Diagnosis present

## 2020-09-05 DIAGNOSIS — S32511A Fracture of superior rim of right pubis, initial encounter for closed fracture: Secondary | ICD-10-CM | POA: Diagnosis present

## 2020-09-05 DIAGNOSIS — Y92032 Bedroom in apartment as the place of occurrence of the external cause: Secondary | ICD-10-CM | POA: Diagnosis not present

## 2020-09-05 DIAGNOSIS — Z7952 Long term (current) use of systemic steroids: Secondary | ICD-10-CM | POA: Diagnosis not present

## 2020-09-05 DIAGNOSIS — S42021A Displaced fracture of shaft of right clavicle, initial encounter for closed fracture: Secondary | ICD-10-CM | POA: Diagnosis present

## 2020-09-05 DIAGNOSIS — Z20822 Contact with and (suspected) exposure to covid-19: Secondary | ICD-10-CM | POA: Diagnosis present

## 2020-09-05 DIAGNOSIS — S32591A Other specified fracture of right pubis, initial encounter for closed fracture: Secondary | ICD-10-CM | POA: Diagnosis present

## 2020-09-05 DIAGNOSIS — I442 Atrioventricular block, complete: Secondary | ICD-10-CM | POA: Diagnosis present

## 2020-09-05 DIAGNOSIS — Y92009 Unspecified place in unspecified non-institutional (private) residence as the place of occurrence of the external cause: Secondary | ICD-10-CM

## 2020-09-05 DIAGNOSIS — I482 Chronic atrial fibrillation, unspecified: Secondary | ICD-10-CM | POA: Diagnosis present

## 2020-09-05 DIAGNOSIS — Z87442 Personal history of urinary calculi: Secondary | ICD-10-CM

## 2020-09-05 DIAGNOSIS — K4020 Bilateral inguinal hernia, without obstruction or gangrene, not specified as recurrent: Secondary | ICD-10-CM | POA: Diagnosis present

## 2020-09-05 DIAGNOSIS — E871 Hypo-osmolality and hyponatremia: Secondary | ICD-10-CM | POA: Diagnosis not present

## 2020-09-05 DIAGNOSIS — Z950 Presence of cardiac pacemaker: Secondary | ICD-10-CM | POA: Diagnosis not present

## 2020-09-05 DIAGNOSIS — S329XXA Fracture of unspecified parts of lumbosacral spine and pelvis, initial encounter for closed fracture: Secondary | ICD-10-CM

## 2020-09-05 DIAGNOSIS — I10 Essential (primary) hypertension: Secondary | ICD-10-CM | POA: Diagnosis present

## 2020-09-05 DIAGNOSIS — D631 Anemia in chronic kidney disease: Secondary | ICD-10-CM | POA: Diagnosis present

## 2020-09-05 DIAGNOSIS — T1490XA Injury, unspecified, initial encounter: Secondary | ICD-10-CM

## 2020-09-05 DIAGNOSIS — Z8249 Family history of ischemic heart disease and other diseases of the circulatory system: Secondary | ICD-10-CM

## 2020-09-05 DIAGNOSIS — I495 Sick sinus syndrome: Secondary | ICD-10-CM | POA: Diagnosis present

## 2020-09-05 DIAGNOSIS — R52 Pain, unspecified: Secondary | ICD-10-CM

## 2020-09-05 LAB — CBC WITH DIFFERENTIAL/PLATELET
Abs Immature Granulocytes: 0.1 10*3/uL — ABNORMAL HIGH (ref 0.00–0.07)
Basophils Absolute: 0.1 10*3/uL (ref 0.0–0.1)
Basophils Relative: 1 %
Eosinophils Absolute: 0.2 10*3/uL (ref 0.0–0.5)
Eosinophils Relative: 1 %
HCT: 38.2 % — ABNORMAL LOW (ref 39.0–52.0)
Hemoglobin: 12.3 g/dL — ABNORMAL LOW (ref 13.0–17.0)
Immature Granulocytes: 1 %
Lymphocytes Relative: 13 %
Lymphs Abs: 1.4 10*3/uL (ref 0.7–4.0)
MCH: 29.9 pg (ref 26.0–34.0)
MCHC: 32.2 g/dL (ref 30.0–36.0)
MCV: 92.9 fL (ref 80.0–100.0)
Monocytes Absolute: 1.3 10*3/uL — ABNORMAL HIGH (ref 0.1–1.0)
Monocytes Relative: 11 %
Neutro Abs: 8.2 10*3/uL — ABNORMAL HIGH (ref 1.7–7.7)
Neutrophils Relative %: 73 %
Platelets: 185 10*3/uL (ref 150–400)
RBC: 4.11 MIL/uL — ABNORMAL LOW (ref 4.22–5.81)
RDW: 13.7 % (ref 11.5–15.5)
WBC: 11.3 10*3/uL — ABNORMAL HIGH (ref 4.0–10.5)
nRBC: 0 % (ref 0.0–0.2)

## 2020-09-05 LAB — RESPIRATORY PANEL BY RT PCR (FLU A&B, COVID)
Influenza A by PCR: NEGATIVE
Influenza B by PCR: NEGATIVE
SARS Coronavirus 2 by RT PCR: NEGATIVE

## 2020-09-05 LAB — BASIC METABOLIC PANEL
Anion gap: 11 (ref 5–15)
BUN: 17 mg/dL (ref 8–23)
CO2: 23 mmol/L (ref 22–32)
Calcium: 8.7 mg/dL — ABNORMAL LOW (ref 8.9–10.3)
Chloride: 105 mmol/L (ref 98–111)
Creatinine, Ser: 1.24 mg/dL (ref 0.61–1.24)
GFR, Estimated: 55 mL/min — ABNORMAL LOW (ref >60)
Glucose, Bld: 109 mg/dL — ABNORMAL HIGH (ref 70–99)
Potassium: 4.8 mmol/L (ref 3.5–5.1)
Sodium: 139 mmol/L (ref 135–145)

## 2020-09-05 LAB — URINALYSIS, ROUTINE W REFLEX MICROSCOPIC
Bilirubin Urine: NEGATIVE
Glucose, UA: NEGATIVE mg/dL
Hgb urine dipstick: NEGATIVE
Ketones, ur: 5 mg/dL — AB
Leukocytes,Ua: NEGATIVE
Nitrite: NEGATIVE
Protein, ur: NEGATIVE mg/dL
Specific Gravity, Urine: 1.016 (ref 1.005–1.030)
pH: 6 (ref 5.0–8.0)

## 2020-09-05 LAB — I-STAT CHEM 8, ED
BUN: 19 mg/dL (ref 8–23)
Calcium, Ion: 1.12 mmol/L — ABNORMAL LOW (ref 1.15–1.40)
Chloride: 103 mmol/L (ref 98–111)
Creatinine, Ser: 1.2 mg/dL (ref 0.61–1.24)
Glucose, Bld: 105 mg/dL — ABNORMAL HIGH (ref 70–99)
HCT: 36 % — ABNORMAL LOW (ref 39.0–52.0)
Hemoglobin: 12.2 g/dL — ABNORMAL LOW (ref 13.0–17.0)
Potassium: 3.9 mmol/L (ref 3.5–5.1)
Sodium: 141 mmol/L (ref 135–145)
TCO2: 26 mmol/L (ref 22–32)

## 2020-09-05 MED ORDER — METHOCARBAMOL 1000 MG/10ML IJ SOLN
500.0000 mg | Freq: Four times a day (QID) | INTRAVENOUS | Status: DC | PRN
  Filled 2020-09-05: qty 5

## 2020-09-05 MED ORDER — PREDNISONE 1 MG PO TABS
2.0000 mg | ORAL_TABLET | Freq: Every day | ORAL | Status: DC
  Administered 2020-09-06 – 2020-09-09 (×4): 2 mg via ORAL
  Filled 2020-09-05 (×5): qty 2

## 2020-09-05 MED ORDER — PANTOPRAZOLE SODIUM 40 MG PO TBEC
40.0000 mg | DELAYED_RELEASE_TABLET | Freq: Every day | ORAL | Status: DC
  Administered 2020-09-06 – 2020-09-09 (×4): 40 mg via ORAL
  Filled 2020-09-05 (×4): qty 1

## 2020-09-05 MED ORDER — DILTIAZEM HCL 30 MG PO TABS
120.0000 mg | ORAL_TABLET | Freq: Every day | ORAL | Status: DC
  Administered 2020-09-06 – 2020-09-09 (×4): 120 mg via ORAL
  Filled 2020-09-05 (×4): qty 4

## 2020-09-05 MED ORDER — ACETAMINOPHEN 650 MG RE SUPP: 650.0000 mg | Freq: Four times a day (QID) | RECTAL | Status: DC | PRN

## 2020-09-05 MED ORDER — HYDRALAZINE HCL 20 MG/ML IJ SOLN: 5.0000 mg | INTRAMUSCULAR | Status: DC | PRN

## 2020-09-05 MED ORDER — FENTANYL CITRATE (PF) 100 MCG/2ML IJ SOLN
50.0000 ug | Freq: Once | INTRAMUSCULAR | Status: AC
  Administered 2020-09-05: 50 ug via INTRAVENOUS
  Filled 2020-09-05: qty 2

## 2020-09-05 MED ORDER — POLYETHYLENE GLYCOL 3350 17 G PO PACK
17.0000 g | PACK | Freq: Every day | ORAL | Status: DC | PRN
  Filled 2020-09-05: qty 1

## 2020-09-05 MED ORDER — APIXABAN 2.5 MG PO TABS
2.5000 mg | ORAL_TABLET | Freq: Two times a day (BID) | ORAL | Status: DC
  Administered 2020-09-05 – 2020-09-09 (×8): 2.5 mg via ORAL
  Filled 2020-09-05 (×8): qty 1

## 2020-09-05 MED ORDER — MORPHINE SULFATE (PF) 2 MG/ML IV SOLN
2.0000 mg | INTRAVENOUS | Status: DC | PRN
  Administered 2020-09-05 – 2020-09-06 (×4): 2 mg via INTRAVENOUS
  Filled 2020-09-05 (×4): qty 1

## 2020-09-05 MED ORDER — HYDROCODONE-ACETAMINOPHEN 5-325 MG PO TABS: 1.0000 | ORAL_TABLET | ORAL | Status: DC | PRN

## 2020-09-05 MED ORDER — DOCUSATE SODIUM 100 MG PO CAPS
100.0000 mg | ORAL_CAPSULE | Freq: Two times a day (BID) | ORAL | Status: DC
  Administered 2020-09-05 – 2020-09-09 (×7): 100 mg via ORAL
  Filled 2020-09-05 (×7): qty 1

## 2020-09-05 MED ORDER — ACETAMINOPHEN 325 MG PO TABS
650.0000 mg | ORAL_TABLET | Freq: Four times a day (QID) | ORAL | Status: DC | PRN
  Administered 2020-09-07: 650 mg via ORAL
  Filled 2020-09-05: qty 2

## 2020-09-05 MED ORDER — TAMSULOSIN HCL 0.4 MG PO CAPS
0.4000 mg | ORAL_CAPSULE | Freq: Every day | ORAL | Status: DC
  Administered 2020-09-06 – 2020-09-09 (×4): 0.4 mg via ORAL
  Filled 2020-09-05 (×4): qty 1

## 2020-09-05 MED ORDER — ATORVASTATIN CALCIUM 10 MG PO TABS
10.0000 mg | ORAL_TABLET | Freq: Every evening | ORAL | Status: DC
  Administered 2020-09-05 – 2020-09-09 (×5): 10 mg via ORAL
  Filled 2020-09-05 (×5): qty 1

## 2020-09-05 MED ORDER — MIRTAZAPINE 15 MG PO TABS
7.5000 mg | ORAL_TABLET | Freq: Every day | ORAL | Status: DC
  Administered 2020-09-05 – 2020-09-08 (×4): 7.5 mg via ORAL
  Filled 2020-09-05 (×4): qty 1

## 2020-09-05 MED ORDER — ALBUTEROL SULFATE (2.5 MG/3ML) 0.083% IN NEBU: 2.5000 mg | INHALATION_SOLUTION | RESPIRATORY_TRACT | Status: DC | PRN

## 2020-09-05 MED ORDER — ONDANSETRON HCL 4 MG/2ML IJ SOLN: 4.0000 mg | Freq: Four times a day (QID) | INTRAMUSCULAR | Status: DC | PRN

## 2020-09-05 MED ORDER — BISACODYL 5 MG PO TBEC: 5.0000 mg | DELAYED_RELEASE_TABLET | Freq: Every day | ORAL | Status: DC | PRN

## 2020-09-05 MED ORDER — ONDANSETRON HCL 4 MG PO TABS: 4.0000 mg | ORAL_TABLET | Freq: Four times a day (QID) | ORAL | Status: DC | PRN

## 2020-09-05 MED ORDER — HYDROCHLOROTHIAZIDE 25 MG PO TABS
12.5000 mg | ORAL_TABLET | Freq: Every day | ORAL | Status: DC
  Administered 2020-09-06 – 2020-09-07 (×2): 12.5 mg via ORAL
  Filled 2020-09-05 (×2): qty 1

## 2020-09-05 NOTE — ED Provider Notes (Signed)
[facility] EMERGENCY DEPARTMENT Provider Note   CSN: [id number] Arrival date & time: 09/05/20  6283     History No chief complaint on file.   [name] is a 86 y.o. male.  Presents to ER with concern for fall.  Patient reports that fell forward into a door frame, unsure why he fell.  Does not think he lost consciousness.  Reports he is having pain to his right shoulder, right hip and right knee.  Pain is worse in his right shoulder.  Sharp, stabbing, nonradiating.  Worsened with movement, improved with rest.  Has not tried ambulating since fall.  Thinks he may have hit his head.  Past medical history sick sinus syndrome, paroxysmal A. fib, complete heart block, underwent permanent pacemaker implantation on 06/04/2020.  Additional history obtained from son, reports patient was in his normal state of health when he talked with him yesterday at 5 PM.  HPI     Past Medical History:  Diagnosis Date   Ankle edema    Anxiety    Dysrhythmia    a-fib/flutter- on eliquis   GERD (gastroesophageal reflux disease)    no treatment   Hard of hearing    Heart block AV complete ([place]) 07/31/2020   Hypertension    Kidney stone    Presence of permanent cardiac pacemaker 06/03/2020   Spinal stenosis    Tachy-brady syndrome ([place]) 06/03/2020    Patient Active Problem List   Diagnosis Date Noted   Heart block AV complete ([place]) 07/31/2020   Tachy-brady syndrome ([place]) 06/03/2020   Pcaemaker [employer] MRI Dual Chamber pacemaker 06/03/2018 06/03/2020   Bilateral inguinal hernia 04/23/2013    Past Surgical History:  Procedure Laterality Date   addnoid     CYSTOSCOPY W/ URETEROSCOPY  1980   INSERT / REPLACE / REMOVE PACEMAKER  06/03/2020   KNEE SURGERY     NAILBED REPAIR Left 06/27/2016   Procedure: NAILBED biopsy left thumb;  Surgeon: Daryll Brod, MD;  Location: [place];  Service: Orthopedics;  Laterality: Left;  FAB     PACEMAKER IMPLANT N/A 06/03/2020   Procedure: PACEMAKER IMPLANT;  Surgeon: Constance Haw, MD;  Location: [place] [facility];  Service: Cardiovascular;  Laterality: N/A;   TONSILLECTOMY         Family History  Problem Relation Age of Onset   Heart failure Father    Heart disease Father     Social History   Tobacco Use   Smoking status: Former Smoker    Packs/day: 1.50    Years: 20.00    Pack years: 30.00    Types: Cigarettes, Pipe, Cigars    Quit date: 03/13/1956    Years since quitting: 64.5   Smokeless tobacco: Never Used  Vaping Use   Vaping Use: Never used  Substance Use Topics   Alcohol use: Yes    Comment: daily   Drug use: No    Home Medications Prior to Admission medications   Medication Sig Start Date End Date Taking? Authorizing Provider  apixaban (ELIQUIS) 2.5 MG TABS tablet Take 1 tablet (2.5 mg total) by mouth 2 (two) times daily. 06/05/20   Shirley Friar, PA-C  atorvastatin (LIPITOR) 10 MG tablet Take 10 mg by mouth daily with breakfast.  11/16/14   [provider]  diltiazem (CARDIZEM) 120 MG tablet Take 120 mg by mouth daily. 05/19/20   [provider]  hydrochlorothiazide (HYDRODIURIL) 12.5 MG tablet Take 12.5 mg by  mouth daily.    [provider]  Melatonin-Pyridoxine (MELATONEX PO) Take by mouth.    [provider]  mirtazapine (REMERON) 7.5 MG tablet Take 7.5 mg by mouth at bedtime.     [provider]  Multiple Vitamins-Minerals (ICAPS AREDS 2 PO) Take 2 tablets by mouth 2 (two) times daily.    [provider]  omeprazole (PRILOSEC) 20 MG capsule Take 20 mg by mouth daily with breakfast.     [provider]  PREDNISONE PO Take 3 mg by mouth daily with breakfast.  01/18/15   [provider]  tamsulosin (FLOMAX) 0.4 MG CAPS capsule Take 0.4 mg by mouth daily. 01/10/15   [provider]    Allergies    Patient has no known allergies.  Review of Systems    Review of Systems  Constitutional: Negative for chills and fever.  HENT: Negative for ear pain and sore throat.   Eyes: Negative for pain and visual disturbance.  Respiratory: Negative for cough and shortness of breath.   Cardiovascular: Negative for chest pain and palpitations.  Gastrointestinal: Negative for abdominal pain and vomiting.  Genitourinary: Negative for dysuria and hematuria.  Musculoskeletal: Positive for arthralgias and myalgias. Negative for back pain.  Skin: Negative for color change and rash.  Neurological: Negative for seizures and syncope.  All other systems reviewed and are negative.   Physical Exam Updated Vital Signs BP 124/68    Pulse 86    Temp 98.4 F (36.9 C) (Oral)    Resp 18    Ht 5\' 6"  (1.676 m)    Wt 73.5 kg    SpO2 94%    BMI 26.15 kg/m   Physical Exam Vitals and nursing note reviewed.  Constitutional:      Appearance: He is well-developed.  HENT:     Head: Normocephalic and atraumatic.  Eyes:     Conjunctiva/sclera: Conjunctivae normal.  Cardiovascular:     Rate and Rhythm: Normal rate and regular rhythm.     Heart sounds: No murmur heard.   Pulmonary:     Effort: Pulmonary effort is normal. No respiratory distress.     Breath sounds: Normal breath sounds.  Abdominal:     Palpations: Abdomen is soft.     Tenderness: There is no abdominal tenderness.  Musculoskeletal:     Cervical back: Neck supple.     Comments: Back: no C, T, L spine TTP, no step off or deformity RUE: Tenderness to palpation over clavicle, shoulder, otherwise no TTP throughout, no deformity, normal joint ROM, radial pulse intact, distal sensation and motor intact LUE: no TTP throughout, no deformity, normal joint ROM, radial pulse intact, distal sensation and motor intact RLE: Tenderness to palpation noted over right hip, right knee, distal pulse, sensation and motor intact LLE: no TTP throughout, no deformity, normal joint ROM, distal pulse, sensation and motor intact   Skin:    General: Skin is warm and dry.     Capillary Refill: Capillary refill takes less than 2 seconds.  Neurological:     General: No focal deficit present.     Mental Status: He is alert and oriented to person, place, and time.  Psychiatric:        Mood and Affect: Mood normal.     ED Results / Procedures / Treatments   Labs (all labs ordered are listed, but only abnormal results are displayed) Labs Reviewed  CBC WITH DIFFERENTIAL/PLATELET - Abnormal; Notable for the following components:  Result Value   WBC 11.3 (*)    RBC 4.11 (*)    Hemoglobin 12.3 (*)    HCT 38.2 (*)    Neutro Abs 8.2 (*)    Monocytes Absolute 1.3 (*)    Abs Immature Granulocytes 0.10 (*)    All other components within normal limits  BASIC METABOLIC PANEL - Abnormal; Notable for the following components:   Glucose, Bld 109 (*)    Calcium 8.7 (*)    GFR, Estimated 55 (*)    All other components within normal limits  I-STAT CHEM 8, ED - Abnormal; Notable for the following components:   Glucose, Bld 105 (*)    Calcium, Ion 1.12 (*)    Hemoglobin 12.2 (*)    HCT 36.0 (*)    All other components within normal limits  RESPIRATORY PANEL BY RT PCR (FLU A&B, COVID)  URINALYSIS, ROUTINE W REFLEX MICROSCOPIC    EKG EKG Interpretation  Date/Time:  Sunday September 05 2020 09:01:00 EDT Ventricular Rate:  105 PR Interval:    QRS Duration: 142 QT Interval:  385 QTC Calculation: 484 R Axis:   -46 Text Interpretation: Sinus tachycardia Supraventricular bigeminy RBBB and LAFB Confirmed by Madalyn Rob [phone]) on 09/05/2020 9:28:51 AM   Radiology DG Chest 1 View  Result Date: 09/05/2020 CLINICAL DATA:  Fall with RIGHT chest pain. EXAM: CHEST  1 VIEW COMPARISON:  06/04/2020 prior studies FINDINGS: Cardiomegaly and LEFT-sided pacemaker again noted. There is no evidence of focal airspace disease, pulmonary edema, suspicious pulmonary nodule/mass, pleural effusion, or pneumothorax. No acute bony  abnormalities are identified. IMPRESSION: Cardiomegaly without evidence of acute cardiopulmonary disease. Electronically Signed   By: Margarette Canada M.D.   On: 09/05/2020 10:16   DG Clavicle Right  Result Date: 09/05/2020 CLINICAL DATA:  Acute RIGHT clavicle pain following fall today. Initial encounter. EXAM: RIGHT CLAVICLE - 2+ VIEWS COMPARISON:  Prior chest and shoulder radiographs FINDINGS: A vertical fracture of the mid RIGHT clavicle is noted with 3 mm distraction. No other acute bony abnormalities are noted. IMPRESSION: Minimally distracted mid RIGHT clavicle fracture. Electronically Signed   By: Margarette Canada M.D.   On: 09/05/2020 10:15   DG Shoulder Right  Result Date: 09/05/2020 CLINICAL DATA:  Fall with right shoulder pain EXAM: RIGHT SHOULDER - 2+ VIEW COMPARISON:  None. FINDINGS: Right mid clavicle fracture with branching component extending lateral to the level of the coracoclavicular interval. There is pending clavicle radiograph. No glenohumeral fracture or dislocation. Located acromioclavicular joint. Prominent osteopenia IMPRESSION: Mid right clavicle fracture extending to the level of the coracoclavicular interval. Electronically Signed   By: Monte Fantasia M.D.   On: 09/05/2020 10:20   CT Head Wo Contrast  Result Date: 09/05/2020 CLINICAL DATA:  Status post fall. EXAM: CT HEAD WITHOUT CONTRAST CT CERVICAL SPINE WITHOUT CONTRAST TECHNIQUE: Multidetector CT imaging of the head and cervical spine was performed following the standard protocol without intravenous contrast. Multiplanar CT image reconstructions of the cervical spine were also generated. COMPARISON:  None. FINDINGS: CT HEAD FINDINGS Brain: Mild diffuse cortical atrophy is noted. Mild chronic ischemic white matter disease is noted. No mass effect or midline shift is noted. Ventricular size is within normal limits. There is no evidence of mass lesion, hemorrhage or acute infarction. Vascular: No hyperdense vessel or unexpected  calcification. Skull: Normal. Negative for fracture or focal lesion. Sinuses/Orbits: No acute finding. Other: None. CT CERVICAL SPINE FINDINGS Alignment: Normal. Skull base and vertebrae: No acute fracture. No primary bone lesion or  focal pathologic process. Soft tissues and spinal canal: No prevertebral fluid or swelling. No visible canal hematoma. Disc levels: Moderate degenerative disc disease is noted at C5-6 and C6-7. Upper chest: Negative. Other: Degenerative changes are seen involving posterior facet joints bilaterally. IMPRESSION: 1. Mild diffuse cortical atrophy. Mild chronic ischemic white matter disease. No acute intracranial abnormality seen. 2. Multilevel degenerative disc disease. No acute abnormality seen in the cervical spine. Electronically Signed   By: Marijo Conception M.D.   On: 09/05/2020 10:50   CT Cervical Spine Wo Contrast  Result Date: 09/05/2020 CLINICAL DATA:  Status post fall. EXAM: CT HEAD WITHOUT CONTRAST CT CERVICAL SPINE WITHOUT CONTRAST TECHNIQUE: Multidetector CT imaging of the head and cervical spine was performed following the standard protocol without intravenous contrast. Multiplanar CT image reconstructions of the cervical spine were also generated. COMPARISON:  None. FINDINGS: CT HEAD FINDINGS Brain: Mild diffuse cortical atrophy is noted. Mild chronic ischemic white matter disease is noted. No mass effect or midline shift is noted. Ventricular size is within normal limits. There is no evidence of mass lesion, hemorrhage or acute infarction. Vascular: No hyperdense vessel or unexpected calcification. Skull: Normal. Negative for fracture or focal lesion. Sinuses/Orbits: No acute finding. Other: None. CT CERVICAL SPINE FINDINGS Alignment: Normal. Skull base and vertebrae: No acute fracture. No primary bone lesion or focal pathologic process. Soft tissues and spinal canal: No prevertebral fluid or swelling. No visible canal hematoma. Disc levels: Moderate degenerative disc  disease is noted at C5-6 and C6-7. Upper chest: Negative. Other: Degenerative changes are seen involving posterior facet joints bilaterally. IMPRESSION: 1. Mild diffuse cortical atrophy. Mild chronic ischemic white matter disease. No acute intracranial abnormality seen. 2. Multilevel degenerative disc disease. No acute abnormality seen in the cervical spine. Electronically Signed   By: Marijo Conception M.D.   On: 09/05/2020 10:50   CT PELVIS WO CONTRAST  Result Date: 09/05/2020 CLINICAL DATA:  Right hip pain after fall. EXAM: CT PELVIS WITHOUT CONTRAST TECHNIQUE: Multidetector CT imaging of the pelvis was performed following the standard protocol without intravenous contrast. COMPARISON:  September 16, 2012. FINDINGS: Urinary Tract:  No abnormality visualized. Bowel:  Unremarkable visualized pelvic bowel loops. Vascular/Lymphatic: No pathologically enlarged lymph nodes. No significant vascular abnormality seen. Reproductive:  No mass or other significant abnormality Other: Stable large left inguinal hernia is noted which contains a loop of colon, but does not result in obstruction. Stable large fat containing right inguinal hernia is noted as well. No ascites is noted. Musculoskeletal: Probable acute nondisplaced fracture is seen involving the right superior pubic ramus which extends posteriorly toward the medial acetabular area, although does not appear to involve the articular surface of the right hip. IMPRESSION: 1. Probable acute nondisplaced fracture is seen involving the right superior pubic ramus which extends posteriorly toward the medial acetabular area, although does not appear to involve the articular surface of the right hip. 2. Stable large left inguinal hernia is noted which contains a loop of colon, but does not result in obstruction. 3. Stable large fat containing right inguinal hernia is noted as well. Electronically Signed   By: Marijo Conception M.D.   On: 09/05/2020 11:57   DG Knee Complete 4  Views Right  Result Date: 09/05/2020 CLINICAL DATA:  Acute RIGHT knee pain following fall today. Initial encounter. EXAM: RIGHT KNEE - COMPLETE 4+ VIEW COMPARISON:  None. FINDINGS: No acute fracture, dislocation or joint effusion. Mild joint space narrowing osteophytosis in all 3 compartments noted. No  focal bony lesions are present. Vascular calcifications are identified. IMPRESSION: 1. No acute abnormality. 2. Mild tricompartmental degenerative changes. Electronically Signed   By: Margarette Canada M.D.   On: 09/05/2020 10:24   DG Hip Unilat W or Wo Pelvis 2-3 Views Right  Result Date: 09/05/2020 CLINICAL DATA:  Acute RIGHT hip pain following fall today. Initial encounter. EXAM: DG HIP (WITH OR WITHOUT PELVIS) 2-3V RIGHT COMPARISON:  06/13/2014 FINDINGS: There is mild cortical irregularity along the RIGHT SUPERIOR and INFERIOR pubic rami which has a different appearance since 06/13/2014 and may represent nondisplaced fractures. No other fracture, subluxation or dislocation identified. Degenerative changes in both hips are present. No suspicious focal bony lesions are present. IMPRESSION: Mild cortical irregularity of the RIGHT SUPERIOR and INFERIOR pubic rami which may represent nondisplaced fractures. Consider CT for further evaluation as clinically indicated. Electronically Signed   By: Margarette Canada M.D.   On: 09/05/2020 10:35    Procedures Procedures (including critical care time)  Medications Ordered in ED Medications  fentaNYL (SUBLIMAZE) injection 50 mcg (50 mcg Intravenous Given 09/05/20 1226)    ED Course  I have reviewed the triage vital signs and the nursing notes.  Pertinent labs & imaging results that were available during my care of the patient were reviewed by me and considered in my medical decision making (see chart for details).  Clinical Course as of Sep 05 1329  Sun Sep 05, 2020  0937 Updated Mikki Santee the son   [RD]  1327 D/w Alvan Dame - rec WBAT for R pelvis fx, sling for RUE   [RD]     Clinical Course User Index [RD] Lucrezia Starch, MD   MDM Rules/Calculators/A&P                         86 year old male presents to ER after a fall.  On initial exam noted to have tenderness over his right clavicle, right hip as well as mild tenderness over his right knee.  Questionable head trauma.  CT head negative, plain films were concerning for nondisplaced fracture of his clavicle as well as possible pubic rami fracture.  CT scan ordered to further evaluate, demonstrated acute nondisplaced fracture involving right superior pubic ramus.  Reviewed injuries with on-call orthopedist, Dr. Ihor Gully.  Recommended sling for clavicle fracture, for pubic ramus fracture recommended weightbearing as tolerated.  Patient having significant pain with any movement, unable to walk due to pain.  Lives in independent living facility.  Given multiple injuries, inability to walk, will consult medicine for admission for observation, pain control, consultation with PT/OT.  Final Clinical Impression(s) / ED Diagnoses Final diagnoses:  Pain  Closed nondisplaced fracture of pelvis, unspecified part of pelvis, initial encounter ([place])  Closed nondisplaced fracture of shaft of right clavicle, initial encounter    Rx / DC Orders ED Discharge Orders    None       Lucrezia Starch, MD 09/05/20 1330

## 2020-09-05 NOTE — Progress Notes (Signed)
   09/05/20 0855  Clinical Encounter Type  Visited With Patient not available  Visit Type Trauma  Referral From Nurse  Consult/Referral To Chaplain  Chaplain responded. Per unit secretary no family is present at the time. Informed to call if patient or family needs our support. This note was prepared by Jeanine Luz, M.Div..  For questions please contact by phone [phone].

## 2020-09-05 NOTE — ED Notes (Signed)
Pt requesting pain meds. Notified MD in regards to patinet's request.

## 2020-09-05 NOTE — ED Notes (Signed)
[phone] Stephen Cuevas son would like an update

## 2020-09-05 NOTE — H&P (Addendum)
History and Physical    Stephen Cuevas JJK:[id number] DOB: [birth year] DOA: 09/05/2020  PCP: [name], DO Consultants:  [name] - cardiology Patient coming from: [facility]; NOKApolinar Cuevas, [phone]   Chief Complaint: fall  HPI: Stephen Cuevas is a 86 y.o. male with medical history significant of pacemaker placement; HTN; and atrial flutter on Eliquis presenting with a fall.  He got out of bed and toppled over, not sure why.  He urinated at 515 and at 715 he got up.  He hit the door frame and landed on the floor.  His entire R side hurts from his neck to his foot.  He lives at [facility] and they do have SNF rehab if needed.    ED Course: Fall - thinks no LOC.  Clavicle and pubic ramus fracture, non-operative.  Needs sling and WBAT.  Severe pain, needs pain control and mobility assistance.  Review of Systems: As per HPI; otherwise review of systems reviewed and negative.   Ambulatory Status:  Ambulates with a walker  COVID Vaccine Status:   Complete  Past Medical History:  Diagnosis Date  . Ankle edema   . Anxiety   . Dysrhythmia    a-fib/flutter- on eliquis  . GERD (gastroesophageal reflux disease)    no treatment  . Hard of hearing   . Heart block AV complete ([place]) 07/31/2020  . Hypertension   . Kidney stone   . Presence of permanent cardiac pacemaker 06/03/2020  . Spinal stenosis   . Tachy-brady syndrome ([place]) 06/03/2020    Past Surgical History:  Procedure Laterality Date  . addnoid    . CYSTOSCOPY W/ URETEROSCOPY  1980  . INSERT / REPLACE / REMOVE PACEMAKER  06/03/2020  . KNEE SURGERY    . NAILBED REPAIR Left 06/27/2016   Procedure: NAILBED biopsy left thumb;  Surgeon: Daryll Brod, MD;  Location: [facility];  Service: Orthopedics;  Laterality: Left;  FAB  . PACEMAKER IMPLANT N/A 06/03/2020   Procedure: PACEMAKER IMPLANT;  Surgeon: Constance Haw, MD;  Location: [place] [facility];  Service: Cardiovascular;   Laterality: N/A;  . TONSILLECTOMY      Social History   Socioeconomic History  . Marital status: Widowed    Spouse name: Not on file  . Number of children: 4  . Years of education: Not on file  . Highest education level: Not on file  Occupational History  . Occupation: retired  Tobacco Use  . Smoking status: Former Smoker    Packs/day: 1.50    Years: 20.00    Pack years: 30.00    Types: Cigarettes, Pipe, Cigars    Quit date: 03/13/1956    Years since quitting: 64.5  . Smokeless tobacco: Never Used  Vaping Use  . Vaping Use: Never used  Substance and Sexual Activity  . Alcohol use: Yes    Comment: daily  . Drug use: No  . Sexual activity: Not on file  Other Topics Concern  . Not on file  Social History Narrative  . Not on file   Social Determinants of Health   Financial Resource Strain:   . Difficulty of Paying Living Expenses: Not on file  Food Insecurity:   . Worried About Charity fundraiser in the Last Year: Not on file  . Ran Out of Food in the Last Year: Not on file  Transportation Needs:   . Lack of Transportation (Medical): Not on file  . Lack of  Transportation (Non-Medical): Not on file  Physical Activity:   . Days of Exercise per Week: Not on file  . Minutes of Exercise per Session: Not on file  Stress:   . Feeling of Stress : Not on file  Social Connections:   . Frequency of Communication with Friends and Family: Not on file  . Frequency of Social Gatherings with Friends and Family: Not on file  . Attends Religious Services: Not on file  . Active Member of Clubs or Organizations: Not on file  . Attends Archivist Meetings: Not on file  . Marital Status: Not on file  Intimate Partner Violence:   . Fear of Current or Ex-Partner: Not on file  . Emotionally Abused: Not on file  . Physically Abused: Not on file  . Sexually Abused: Not on file    No Known Allergies  Family History  Problem Relation Age of Onset  . Heart failure Father     . Heart disease Father     Prior to Admission medications   Medication Sig Start Date End Date Taking? Authorizing Provider  apixaban (ELIQUIS) 2.5 MG TABS tablet Take 1 tablet (2.5 mg total) by mouth 2 (two) times daily. 06/05/20   Shirley Friar, PA-C  atorvastatin (LIPITOR) 10 MG tablet Take 10 mg by mouth daily with breakfast.  11/16/14   [provider]  diltiazem (CARDIZEM) 120 MG tablet Take 120 mg by mouth daily. 05/19/20   [provider]  hydrochlorothiazide (HYDRODIURIL) 12.5 MG tablet Take 12.5 mg by mouth daily.    [provider]  Melatonin-Pyridoxine (MELATONEX PO) Take by mouth.    [provider]  mirtazapine (REMERON) 7.5 MG tablet Take 7.5 mg by mouth at bedtime.     [provider]  Multiple Vitamins-Minerals (ICAPS AREDS 2 PO) Take 2 tablets by mouth 2 (two) times daily.    [provider]  omeprazole (PRILOSEC) 20 MG capsule Take 20 mg by mouth daily with breakfast.     [provider]  PREDNISONE PO Take 3 mg by mouth daily with breakfast.  01/18/15   [provider]  tamsulosin (FLOMAX) 0.4 MG CAPS capsule Take 0.4 mg by mouth daily. 01/10/15   [provider]    Physical Exam: Vitals:   09/05/20 1130 09/05/20 1215 09/05/20 1345 09/05/20 1400  BP: 123/79 124/68 134/72 134/82  Pulse: 78 86 (!) 45 99  Resp: 16 18 20 18   Temp:      TempSrc:      SpO2: 97% 94% 95%   Weight:      Height:         . General:  Appears pleasant but uncomfortable at rest and miserable with any movement . Eyes:  PERRL, EOMI, normal lids, iris . ENT:  Extremely hard of hearing, grossly normal lips & tongue, mmm; appropriate dentition . Neck:  no LAD, masses or thyromegaly . Cardiovascular:  RRR, no m/r/g. No LE edema.  Marland Kitchen Respiratory:   CTA bilaterally with no wheezes/rales/rhonchi.  Normal respiratory effort. . Abdomen:  soft, NT, ND, NABS . Skin:  no rash or induration seen on limited  exam . Musculoskeletal:  grossly normal tone BUE/BLE, good ROM, no bony abnormality; R shoulder is in a sling . Lower extremity:  No LE edema.  Limited foot exam with no ulcerations.  2+ distal pulses. Marland Kitchen Psychiatric:  grossly normal mood and affect, speech fluent and appropriate, AOx3 . Neurologic:  CN 2-12 grossly intact, moves all extremities  in coordinated fashion    Radiological Exams on Admission: Independently reviewed - see discussion in A/P where applicable  DG Chest 1 View  Result Date: 09/05/2020 CLINICAL DATA:  Fall with RIGHT chest pain. EXAM: CHEST  1 VIEW COMPARISON:  06/04/2020 prior studies FINDINGS: Cardiomegaly and LEFT-sided pacemaker again noted. There is no evidence of focal airspace disease, pulmonary edema, suspicious pulmonary nodule/mass, pleural effusion, or pneumothorax. No acute bony abnormalities are identified. IMPRESSION: Cardiomegaly without evidence of acute cardiopulmonary disease. Electronically Signed   By: Margarette Canada M.D.   On: 09/05/2020 10:16   DG Clavicle Right  Result Date: 09/05/2020 CLINICAL DATA:  Acute RIGHT clavicle pain following fall today. Initial encounter. EXAM: RIGHT CLAVICLE - 2+ VIEWS COMPARISON:  Prior chest and shoulder radiographs FINDINGS: A vertical fracture of the mid RIGHT clavicle is noted with 3 mm distraction. No other acute bony abnormalities are noted. IMPRESSION: Minimally distracted mid RIGHT clavicle fracture. Electronically Signed   By: Margarette Canada M.D.   On: 09/05/2020 10:15   DG Shoulder Right  Result Date: 09/05/2020 CLINICAL DATA:  Fall with right shoulder pain EXAM: RIGHT SHOULDER - 2+ VIEW COMPARISON:  None. FINDINGS: Right mid clavicle fracture with branching component extending lateral to the level of the coracoclavicular interval. There is pending clavicle radiograph. No glenohumeral fracture or dislocation. Located acromioclavicular joint. Prominent osteopenia IMPRESSION: Mid right clavicle fracture extending to  the level of the coracoclavicular interval. Electronically Signed   By: Monte Fantasia M.D.   On: 09/05/2020 10:20   CT Head Wo Contrast  Result Date: 09/05/2020 CLINICAL DATA:  Status post fall. EXAM: CT HEAD WITHOUT CONTRAST CT CERVICAL SPINE WITHOUT CONTRAST TECHNIQUE: Multidetector CT imaging of the head and cervical spine was performed following the standard protocol without intravenous contrast. Multiplanar CT image reconstructions of the cervical spine were also generated. COMPARISON:  None. FINDINGS: CT HEAD FINDINGS Brain: Mild diffuse cortical atrophy is noted. Mild chronic ischemic white matter disease is noted. No mass effect or midline shift is noted. Ventricular size is within normal limits. There is no evidence of mass lesion, hemorrhage or acute infarction. Vascular: No hyperdense vessel or unexpected calcification. Skull: Normal. Negative for fracture or focal lesion. Sinuses/Orbits: No acute finding. Other: None. CT CERVICAL SPINE FINDINGS Alignment: Normal. Skull base and vertebrae: No acute fracture. No primary bone lesion or focal pathologic process. Soft tissues and spinal canal: No prevertebral fluid or swelling. No visible canal hematoma. Disc levels: Moderate degenerative disc disease is noted at C5-6 and C6-7. Upper chest: Negative. Other: Degenerative changes are seen involving posterior facet joints bilaterally. IMPRESSION: 1. Mild diffuse cortical atrophy. Mild chronic ischemic white matter disease. No acute intracranial abnormality seen. 2. Multilevel degenerative disc disease. No acute abnormality seen in the cervical spine. Electronically Signed   By: Marijo Conception M.D.   On: 09/05/2020 10:50   CT Cervical Spine Wo Contrast  Result Date: 09/05/2020 CLINICAL DATA:  Status post fall. EXAM: CT HEAD WITHOUT CONTRAST CT CERVICAL SPINE WITHOUT CONTRAST TECHNIQUE: Multidetector CT imaging of the head and cervical spine was performed following the standard protocol without  intravenous contrast. Multiplanar CT image reconstructions of the cervical spine were also generated. COMPARISON:  None. FINDINGS: CT HEAD FINDINGS Brain: Mild diffuse cortical atrophy is noted. Mild chronic ischemic white matter disease is noted. No mass effect or midline shift is noted. Ventricular size is within normal limits. There is no evidence of mass lesion, hemorrhage or acute infarction. Vascular: No hyperdense vessel or  unexpected calcification. Skull: Normal. Negative for fracture or focal lesion. Sinuses/Orbits: No acute finding. Other: None. CT CERVICAL SPINE FINDINGS Alignment: Normal. Skull base and vertebrae: No acute fracture. No primary bone lesion or focal pathologic process. Soft tissues and spinal canal: No prevertebral fluid or swelling. No visible canal hematoma. Disc levels: Moderate degenerative disc disease is noted at C5-6 and C6-7. Upper chest: Negative. Other: Degenerative changes are seen involving posterior facet joints bilaterally. IMPRESSION: 1. Mild diffuse cortical atrophy. Mild chronic ischemic white matter disease. No acute intracranial abnormality seen. 2. Multilevel degenerative disc disease. No acute abnormality seen in the cervical spine. Electronically Signed   By: Marijo Conception M.D.   On: 09/05/2020 10:50   CT PELVIS WO CONTRAST  Result Date: 09/05/2020 CLINICAL DATA:  Right hip pain after fall. EXAM: CT PELVIS WITHOUT CONTRAST TECHNIQUE: Multidetector CT imaging of the pelvis was performed following the standard protocol without intravenous contrast. COMPARISON:  September 16, 2012. FINDINGS: Urinary Tract:  No abnormality visualized. Bowel:  Unremarkable visualized pelvic bowel loops. Vascular/Lymphatic: No pathologically enlarged lymph nodes. No significant vascular abnormality seen. Reproductive:  No mass or other significant abnormality Other: Stable large left inguinal hernia is noted which contains a loop of colon, but does not result in obstruction. Stable  large fat containing right inguinal hernia is noted as well. No ascites is noted. Musculoskeletal: Probable acute nondisplaced fracture is seen involving the right superior pubic ramus which extends posteriorly toward the medial acetabular area, although does not appear to involve the articular surface of the right hip. IMPRESSION: 1. Probable acute nondisplaced fracture is seen involving the right superior pubic ramus which extends posteriorly toward the medial acetabular area, although does not appear to involve the articular surface of the right hip. 2. Stable large left inguinal hernia is noted which contains a loop of colon, but does not result in obstruction. 3. Stable large fat containing right inguinal hernia is noted as well. Electronically Signed   By: Marijo Conception M.D.   On: 09/05/2020 11:57   DG Knee Complete 4 Views Right  Result Date: 09/05/2020 CLINICAL DATA:  Acute RIGHT knee pain following fall today. Initial encounter. EXAM: RIGHT KNEE - COMPLETE 4+ VIEW COMPARISON:  None. FINDINGS: No acute fracture, dislocation or joint effusion. Mild joint space narrowing osteophytosis in all 3 compartments noted. No focal bony lesions are present. Vascular calcifications are identified. IMPRESSION: 1. No acute abnormality. 2. Mild tricompartmental degenerative changes. Electronically Signed   By: Margarette Canada M.D.   On: 09/05/2020 10:24   DG Hip Unilat W or Wo Pelvis 2-3 Views Right  Result Date: 09/05/2020 CLINICAL DATA:  Acute RIGHT hip pain following fall today. Initial encounter. EXAM: DG HIP (WITH OR WITHOUT PELVIS) 2-3V RIGHT COMPARISON:  06/13/2014 FINDINGS: There is mild cortical irregularity along the RIGHT SUPERIOR and INFERIOR pubic rami which has a different appearance since 06/13/2014 and may represent nondisplaced fractures. No other fracture, subluxation or dislocation identified. Degenerative changes in both hips are present. No suspicious focal bony lesions are present. IMPRESSION:  Mild cortical irregularity of the RIGHT SUPERIOR and INFERIOR pubic rami which may represent nondisplaced fractures. Consider CT for further evaluation as clinically indicated. Electronically Signed   By: Margarette Canada M.D.   On: 09/05/2020 10:35    EKG: Independently reviewed.  Sinus tachycardia with rate 105; RBBB and LAFB with NSCSLT   Labs on Admission: I have personally reviewed the available labs and imaging studies at the time of the  admission.  Pertinent labs:   Glucose 109 GFR 55 - stable WBC 11.3 Hgb 12.3 COVID/flu negative UA: 5 ketones   Assessment/Plan Principal Problem:   Fall at home, initial encounter Active Problems:   [place] MRI Dual Chamber pacemaker 06/03/2018   Essential hypertension   Atrial fibrillation, chronic (HCC)   Closed traumatic minimally displaced fracture of shaft of right clavicle   Pubic ramus fracture, right, closed, initial encounter ([place])   Fall at home  -Patient with apparent mechanical fall at home -R clavicular fracture -Probable acute nondisplaced fracture is seen involving the right superior pubic ramus which extends posteriorly toward the medial acetabular area -Orthopedics consulted and recommended shoulder sling and WBAT -Pain control with Robaxin, Vicodin, and Morphine prn -TOC team consult for rehab placement - lives at [facility] independent living and likely needs higher level of care at least short-term -Will need PT consult  -He appears to take daily prednisone and this may delay his wound healing although the dose is low enough to make this less likely -Will admit for pain control and mobility assistance; it is unlikely that he will improve quickly enough to go home in <2 MN  HTN -Continue HCTZ  Stage 3a CKD -Appears stable/better than prior -Will follow  Afib -Rate controlled with Cardizem, pacemaker -Continue Eliquis -If patient continues to have falls, may need to consider  risks/benefits of ongoing AC  B inguinal hernias -Incidentally noted on CT -No indication for intervention at this time     Note: This patient has been tested and is negative for the novel coronavirus COVID-19. The patient has been fully vaccinated against COVID-19.    DVT prophylaxis:  Eliquis Code Status:  DNR - confirmed with patient/bedside paperwork Family Communication: None present  Disposition Plan:  Home once clinically improved Consults called: Orthopedics by telephone only; [place] team, PT/OT Admission status: Admit - It is my clinical opinion that admission to INPATIENT is reasonable and necessary because of the expectation that this patient will require hospital care that crosses at least 2 midnights to treat this condition based on the medical complexity of the problems presented.  Given the aforementioned information, the predictability of an adverse outcome is felt to be significant.    Karmen Bongo MD [facility]   How to contact the [facility] Attending or Consulting provider [place] or covering provider during after hours [place], for this patient?  1. Check the care team in [facility] and look for a) attending/consulting [facility] provider listed and b) the [facility] team listed 2. Log into [email or web address] and use [facility]'s universal password to access. If you do not have the password, please contact the hospital operator. 3. Locate the [facility] provider you are looking for under [facility] and page to a number that you can be directly reached. 4. If you still have difficulty reaching the provider, please page the [facility] (Director on Call) for the Hospitalists listed on amion for assistance.   09/05/2020, 3:05 PM

## 2020-09-05 NOTE — ED Triage Notes (Signed)
[facility] here from [facility] after falling forward into door frame. Pt with pain to R shoulder, R clavicle, R hip. Pt on eliquis. Unknown what caused the fall. Pt with recent pacemaker placement this year. Pt with a run of vtach en route with ems. Self converted into afib. Deformity to R clavicle. 158/70, CBG 145, 98% RA, HR 80-100.

## 2020-09-05 NOTE — Progress Notes (Signed)
Orthopedic Tech Progress Note Patient Details:  Stephen Cuevas 1926-03-29 [id number]  Ortho Devices Type of Ortho Device: Shoulder immobilizer Ortho Device/Splint Location: Right Upper Extremity Ortho Device/Splint Interventions: Ordered, Application   Post Interventions Patient Tolerated: Well Instructions Provided: Adjustment of device, Care of device, [place] ambulation with device   Thresia Ramanathan P Lorel Monaco 09/05/2020, 10:41 AM

## 2020-09-05 NOTE — ED Notes (Signed)
[employer] pace maker interrogated.  

## 2020-09-05 NOTE — Plan of Care (Signed)

## 2020-09-06 ENCOUNTER — Telehealth: Payer: Self-pay | Admitting: Cardiology

## 2020-09-06 ENCOUNTER — Encounter (HOSPITAL_COMMUNITY): Payer: Self-pay | Admitting: Internal Medicine

## 2020-09-06 LAB — CBC
HCT: 33.3 % — ABNORMAL LOW (ref 39.0–52.0)
Hemoglobin: 10.9 g/dL — ABNORMAL LOW (ref 13.0–17.0)
MCH: 30.3 pg (ref 26.0–34.0)
MCHC: 32.7 g/dL (ref 30.0–36.0)
MCV: 92.5 fL (ref 80.0–100.0)
Platelets: 167 10*3/uL (ref 150–400)
RBC: 3.6 MIL/uL — ABNORMAL LOW (ref 4.22–5.81)
RDW: 13.9 % (ref 11.5–15.5)
WBC: 9.1 10*3/uL (ref 4.0–10.5)
nRBC: 0 % (ref 0.0–0.2)

## 2020-09-06 LAB — BASIC METABOLIC PANEL
Anion gap: 10 (ref 5–15)
BUN: 19 mg/dL (ref 8–23)
CO2: 23 mmol/L (ref 22–32)
Calcium: 8.5 mg/dL — ABNORMAL LOW (ref 8.9–10.3)
Chloride: 104 mmol/L (ref 98–111)
Creatinine, Ser: 1.19 mg/dL (ref 0.61–1.24)
GFR, Estimated: 57 mL/min — ABNORMAL LOW (ref >60)
Glucose, Bld: 95 mg/dL (ref 70–99)
Potassium: 4 mmol/L (ref 3.5–5.1)
Sodium: 137 mmol/L (ref 135–145)

## 2020-09-06 MED ORDER — HYDROCODONE-ACETAMINOPHEN 5-325 MG PO TABS
1.0000 | ORAL_TABLET | Freq: Four times a day (QID) | ORAL | Status: DC | PRN
  Administered 2020-09-06 – 2020-09-08 (×5): 1 via ORAL
  Filled 2020-09-06 (×5): qty 1

## 2020-09-06 MED ORDER — ADULT MULTIVITAMIN W/MINERALS CH
1.0000 | ORAL_TABLET | Freq: Every day | ORAL | Status: DC
  Administered 2020-09-06 – 2020-09-09 (×4): 1 via ORAL
  Filled 2020-09-06 (×4): qty 1

## 2020-09-06 MED ORDER — ENSURE ENLIVE PO LIQD
237.0000 mL | Freq: Two times a day (BID) | ORAL | Status: DC
  Administered 2020-09-07 – 2020-09-08 (×3): 237 mL via ORAL

## 2020-09-06 NOTE — Progress Notes (Signed)
PROGRESS NOTE                                                                                                                                                                                                             Patient Demographics:    Stephen Cuevas, is a 86 y.o. male, DOB - [birth year], XTK:[id number]  Outpatient Primary MD for the patient is [name], DO    LOS - 1  Admit date - 09/05/2020    CC - Fall     Brief Narrative (HPI from H&P)  - Stephen Cuevas is a 86 y.o. male with medical history significant of pacemaker placement; HTN; and atrial flutter on Eliquis presenting with a fall.  He got out of bed and toppled over, not sure why.  He urinated at 515 and at 715 he got up.  He hit the door frame and landed on the floor.  His entire R side hurts from his neck to his foot.  He lives at [facility] and they do have SNF rehab if needed   Subjective:    Stephen Cuevas today has, No headache, No chest pain, No abdominal pain - No Nausea, No new weakness tingling or numbness, no SOB, +ve R hip and shoulder pain.   Assessment  & Plan :     1. Mechanical fall with R Clavicle and R .Pubic Ramus fracture  -seen by Dr.: Orthopedics, recommendation to keep right shoulder in sling for 4 weeks with no range of motion exercise for now, nonweightbearing in the right arm for 10 weeks.  Weightbearing as tolerated on the right hip, supportive care, PT-OT, will require SNF as he was living in an independent living apartment thus far.   2.  History of chronic atrial flutter (pacemaker).  Mali vas 2 score of greater than 3.  Continue Cardizem along with Eliquis for anticoagulation.  He is s/p pacemaker placement.  Follows with Dr. [name].  3.  Essential hypertension.  On Cardizem and hydrochlorothiazide combination continue.  4.  Bilateral inguinal hernias.  Incidental finding on CT scan, asymptomatic.  Age-appropriate outpatient  follow-up with PCP.  5. CKD Stage IIIa.  Baseline creatinine around 1.3.  Monitor.      Condition -  Guarded  Family Communication  :  Tilman Neat [phone] on 09/06/20  Code Status :  DNR  Consults  :  Orthopedics Dr Alvan Dame  Procedures  :    CT - Head - C spine - Non acute.  CT R. Hip - 1. Probable acute nondisplaced fracture is seen involving the right superior pubic ramus which extends posteriorly toward the medial acetabular area, although does not appear to involve the articular surface of the right hip. 2. Stable large left inguinal hernia is noted which contains a loop of colon, but does not result in obstruction. 3. Stable large fat containing right inguinal hernia is noted as well.  PUD Prophylaxis : PPI  Disposition Plan  :    Status is: Inpatient  Remains inpatient appropriate because:IV treatments appropriate due to intensity of illness or inability to take PO   Dispo: The patient is from: Home              Anticipated d/c is to: SNF              Anticipated d/c date is: 3 days              Patient currently is not medically stable to d/c.    DVT Prophylaxis  :  Eliquis  Lab Results  Component Value Date   PLT 167 09/06/2020    Diet :  Diet Order            Diet Heart Room service appropriate? Yes with Assist; Fluid consistency: Thin  Diet effective now                  Inpatient Medications  Scheduled Meds: . apixaban  2.5 mg Oral BID  . atorvastatin  10 mg Oral QPM  . diltiazem  120 mg Oral Daily  . docusate sodium  100 mg Oral BID  . [START ON 09/07/2020] feeding supplement  237 mL Oral BID BM  . hydrochlorothiazide  12.5 mg Oral Daily  . mirtazapine  7.5 mg Oral QHS  . multivitamin with minerals  1 tablet Oral Daily  . pantoprazole  40 mg Oral Daily  . predniSONE  2 mg Oral Q breakfast  . tamsulosin  0.4 mg Oral Daily   Continuous Infusions: . methocarbamol (ROBAXIN) IV     PRN Meds:.acetaminophen **OR** [DISCONTINUED]  acetaminophen, albuterol, bisacodyl, hydrALAZINE, HYDROcodone-acetaminophen, methocarbamol (ROBAXIN) IV, morphine injection, [DISCONTINUED] ondansetron **OR** ondansetron (ZOFRAN) IV, polyethylene glycol  Antibiotics  :    Anti-infectives (From admission, onward)   None       Time Spent in minutes  30   Lala Lund M.D on 09/06/2020 at 12:11 PM  To page go to [email or web address] - password [facility]  [facility] -  Office  [phone]    See all Orders from today for further details    Objective:   Vitals:   09/05/20 1400 09/05/20 1950 09/06/20 0359 09/06/20 0817  BP: 134/82 138/84 (!) 141/58 (!) 148/58  Pulse: 99 80 78 95  Resp: 18 16 16 18   Temp:  98.6 F (37 C) 98.4 F (36.9 C) 98.4 F (36.9 C)  TempSrc:  Oral Oral Oral  SpO2:  96% 94% 92%  Weight:      Height:        Wt Readings from Last 3 Encounters:  09/05/20 73.5 kg  07/29/20 73.5 kg  06/04/20 72.2 kg     Intake/Output Summary (Last 24 hours) at 09/06/2020 1211 Last data filed at 09/06/2020 1100 Gross per 24 hour  Intake 720 ml  Output 650 ml  Net 70 ml  Physical Exam  Awake Alert, No new F.N deficits, Normal affect [place].AT,PERRAL Supple Neck,No JVD, No cervical lymphadenopathy appriciated.  Symmetrical Chest wall movement, Good air movement bilaterally, CTAB RRR,No Gallops,Rubs or new Murmurs, No Parasternal Heave +ve B.Sounds, Abd Soft, No tenderness, No organomegaly appriciated, No rebound - guarding or rigidity. No Cyanosis, R shoulder in sling    Data Review:    CBC Recent Labs  Lab 09/05/20 0907 09/05/20 0913 09/06/20 0145  WBC 11.3*  --  9.1  HGB 12.3* 12.2* 10.9*  HCT 38.2* 36.0* 33.3*  PLT 185  --  167  MCV 92.9  --  92.5  MCH 29.9  --  30.3  MCHC 32.2  --  32.7  RDW 13.7  --  13.9  LYMPHSABS 1.4  --   --   MONOABS 1.3*  --   --   EOSABS 0.2  --   --   BASOSABS 0.1  --   --     Recent Labs  Lab 09/05/20 0907 09/05/20 0913 09/06/20 0145  NA 139 141 137  K  4.8 3.9 4.0  CL 105 103 104  CO2 23  --  23  GLUCOSE 109* 105* 95  BUN 17 19 19   CREATININE 1.24 1.20 1.19  CALCIUM 8.7*  --  8.5*    ------------------------------------------------------------------------------------------------------------------ No results for input(s): CHOL, HDL, LDLCALC, TRIG, CHOLHDL, LDLDIRECT in the last 72 hours.  No results found for: HGBA1C ------------------------------------------------------------------------------------------------------------------ No results for input(s): TSH, T4TOTAL, T3FREE, THYROIDAB in the last 72 hours.  Invalid input(s): FREET3  Cardiac Enzymes No results for input(s): CKMB, TROPONINI, MYOGLOBIN in the last 168 hours.  Invalid input(s): CK ------------------------------------------------------------------------------------------------------------------    Component Value Date/Time   BNP 64.1 01/26/2015 1014    Micro Results Recent Results (from the past 240 hour(s))  Respiratory Panel by RT PCR (Flu A&B, Covid) - Nasopharyngeal Swab     Status: None   Collection Time: 09/05/20 11:00 AM   Specimen: Nasopharyngeal Swab  Result Value Ref Range Status   SARS Coronavirus 2 by RT PCR NEGATIVE NEGATIVE Final    Comment: (NOTE) SARS-CoV-2 target nucleic acids are NOT DETECTED.  The SARS-CoV-2 RNA is generally detectable in upper respiratoy specimens during the acute phase of infection. The lowest concentration of SARS-CoV-2 viral copies this assay can detect is 131 copies/mL. A negative result does not preclude SARS-Cov-2 infection and should not be used as the sole basis for treatment or other patient management decisions. A negative result may occur with  improper specimen collection/handling, submission of specimen other than nasopharyngeal swab, presence of viral mutation(s) within the areas targeted by this assay, and inadequate number of viral copies (<131 copies/mL). A negative result must be combined with  clinical observations, patient history, and epidemiological information. The expected result is Negative.  Fact Sheet for Patients:  PinkCheek.be  Fact Sheet for Healthcare Providers:  GravelBags.it  This test is no t yet approved or cleared by the [place] FDA and  has been authorized for detection and/or diagnosis of SARS-CoV-2 by FDA under an Emergency Use Authorization (EUA). This EUA will remain  in effect (meaning this test can be used) for the duration of the COVID-19 declaration under Section 564(b)(1) of the Act, 21 U.S.C. section 360bbb-3(b)(1), unless the authorization is terminated or revoked sooner.     Influenza A by PCR NEGATIVE NEGATIVE Final   Influenza B by PCR NEGATIVE NEGATIVE Final    Comment: (NOTE) The Xpert Xpress SARS-CoV-2/FLU/RSV assay is intended as  an aid in  the diagnosis of influenza from Nasopharyngeal swab specimens and  should not be used as a sole basis for treatment. Nasal washings and  aspirates are unacceptable for Xpert Xpress SARS-CoV-2/FLU/RSV  testing.  Fact Sheet for Patients: PinkCheek.be  Fact Sheet for Healthcare Providers: GravelBags.it  This test is not yet approved or cleared by the [place] FDA and  has been authorized for detection and/or diagnosis of SARS-CoV-2 by  FDA under an Emergency Use Authorization (EUA). This EUA will remain  in effect (meaning this test can be used) for the duration of the  Covid-19 declaration under Section 564(b)(1) of the Act, 21  U.S.C. section 360bbb-3(b)(1), unless the authorization is  terminated or revoked. Performed at [facility], [place] [address] [place], [place] [address]     Radiology Reports DG Chest 1 View  Result Date: 09/05/2020 CLINICAL DATA:  Fall with RIGHT chest pain. EXAM: CHEST  1 VIEW COMPARISON:  06/04/2020 prior studies FINDINGS:  Cardiomegaly and LEFT-sided pacemaker again noted. There is no evidence of focal airspace disease, pulmonary edema, suspicious pulmonary nodule/mass, pleural effusion, or pneumothorax. No acute bony abnormalities are identified. IMPRESSION: Cardiomegaly without evidence of acute cardiopulmonary disease. Electronically Signed   By: Margarette Canada M.D.   On: 09/05/2020 10:16   DG Clavicle Right  Result Date: 09/05/2020 CLINICAL DATA:  Acute RIGHT clavicle pain following fall today. Initial encounter. EXAM: RIGHT CLAVICLE - 2+ VIEWS COMPARISON:  Prior chest and shoulder radiographs FINDINGS: A vertical fracture of the mid RIGHT clavicle is noted with 3 mm distraction. No other acute bony abnormalities are noted. IMPRESSION: Minimally distracted mid RIGHT clavicle fracture. Electronically Signed   By: Margarette Canada M.D.   On: 09/05/2020 10:15   DG Shoulder Right  Result Date: 09/05/2020 CLINICAL DATA:  Fall with right shoulder pain EXAM: RIGHT SHOULDER - 2+ VIEW COMPARISON:  None. FINDINGS: Right mid clavicle fracture with branching component extending lateral to the level of the coracoclavicular interval. There is pending clavicle radiograph. No glenohumeral fracture or dislocation. Located acromioclavicular joint. Prominent osteopenia IMPRESSION: Mid right clavicle fracture extending to the level of the coracoclavicular interval. Electronically Signed   By: Monte Fantasia M.D.   On: 09/05/2020 10:20   CT Head Wo Contrast  Result Date: 09/05/2020 CLINICAL DATA:  Status post fall. EXAM: CT HEAD WITHOUT CONTRAST CT CERVICAL SPINE WITHOUT CONTRAST TECHNIQUE: Multidetector CT imaging of the head and cervical spine was performed following the standard protocol without intravenous contrast. Multiplanar CT image reconstructions of the cervical spine were also generated. COMPARISON:  None. FINDINGS: CT HEAD FINDINGS Brain: Mild diffuse cortical atrophy is noted. Mild chronic ischemic white matter disease is noted.  No mass effect or midline shift is noted. Ventricular size is within normal limits. There is no evidence of mass lesion, hemorrhage or acute infarction. Vascular: No hyperdense vessel or unexpected calcification. Skull: Normal. Negative for fracture or focal lesion. Sinuses/Orbits: No acute finding. Other: None. CT CERVICAL SPINE FINDINGS Alignment: Normal. Skull base and vertebrae: No acute fracture. No primary bone lesion or focal pathologic process. Soft tissues and spinal canal: No prevertebral fluid or swelling. No visible canal hematoma. Disc levels: Moderate degenerative disc disease is noted at C5-6 and C6-7. Upper chest: Negative. Other: Degenerative changes are seen involving posterior facet joints bilaterally. IMPRESSION: 1. Mild diffuse cortical atrophy. Mild chronic ischemic white matter disease. No acute intracranial abnormality seen. 2. Multilevel degenerative disc disease. No acute abnormality seen in the cervical spine. Electronically  Signed   By: Marijo Conception M.D.   On: 09/05/2020 10:50   CT Cervical Spine Wo Contrast  Result Date: 09/05/2020 CLINICAL DATA:  Status post fall. EXAM: CT HEAD WITHOUT CONTRAST CT CERVICAL SPINE WITHOUT CONTRAST TECHNIQUE: Multidetector CT imaging of the head and cervical spine was performed following the standard protocol without intravenous contrast. Multiplanar CT image reconstructions of the cervical spine were also generated. COMPARISON:  None. FINDINGS: CT HEAD FINDINGS Brain: Mild diffuse cortical atrophy is noted. Mild chronic ischemic white matter disease is noted. No mass effect or midline shift is noted. Ventricular size is within normal limits. There is no evidence of mass lesion, hemorrhage or acute infarction. Vascular: No hyperdense vessel or unexpected calcification. Skull: Normal. Negative for fracture or focal lesion. Sinuses/Orbits: No acute finding. Other: None. CT CERVICAL SPINE FINDINGS Alignment: Normal. Skull base and vertebrae: No acute  fracture. No primary bone lesion or focal pathologic process. Soft tissues and spinal canal: No prevertebral fluid or swelling. No visible canal hematoma. Disc levels: Moderate degenerative disc disease is noted at C5-6 and C6-7. Upper chest: Negative. Other: Degenerative changes are seen involving posterior facet joints bilaterally. IMPRESSION: 1. Mild diffuse cortical atrophy. Mild chronic ischemic white matter disease. No acute intracranial abnormality seen. 2. Multilevel degenerative disc disease. No acute abnormality seen in the cervical spine. Electronically Signed   By: Marijo Conception M.D.   On: 09/05/2020 10:50   CT PELVIS WO CONTRAST  Result Date: 09/05/2020 CLINICAL DATA:  Right hip pain after fall. EXAM: CT PELVIS WITHOUT CONTRAST TECHNIQUE: Multidetector CT imaging of the pelvis was performed following the standard protocol without intravenous contrast. COMPARISON:  September 16, 2012. FINDINGS: Urinary Tract:  No abnormality visualized. Bowel:  Unremarkable visualized pelvic bowel loops. Vascular/Lymphatic: No pathologically enlarged lymph nodes. No significant vascular abnormality seen. Reproductive:  No mass or other significant abnormality Other: Stable large left inguinal hernia is noted which contains a loop of colon, but does not result in obstruction. Stable large fat containing right inguinal hernia is noted as well. No ascites is noted. Musculoskeletal: Probable acute nondisplaced fracture is seen involving the right superior pubic ramus which extends posteriorly toward the medial acetabular area, although does not appear to involve the articular surface of the right hip. IMPRESSION: 1. Probable acute nondisplaced fracture is seen involving the right superior pubic ramus which extends posteriorly toward the medial acetabular area, although does not appear to involve the articular surface of the right hip. 2. Stable large left inguinal hernia is noted which contains a loop of colon, but does  not result in obstruction. 3. Stable large fat containing right inguinal hernia is noted as well. Electronically Signed   By: Marijo Conception M.D.   On: 09/05/2020 11:57   DG Knee Complete 4 Views Right  Result Date: 09/05/2020 CLINICAL DATA:  Acute RIGHT knee pain following fall today. Initial encounter. EXAM: RIGHT KNEE - COMPLETE 4+ VIEW COMPARISON:  None. FINDINGS: No acute fracture, dislocation or joint effusion. Mild joint space narrowing osteophytosis in all 3 compartments noted. No focal bony lesions are present. Vascular calcifications are identified. IMPRESSION: 1. No acute abnormality. 2. Mild tricompartmental degenerative changes. Electronically Signed   By: Margarette Canada M.D.   On: 09/05/2020 10:24   CUP PACEART REMOTE DEVICE CHECK  Result Date: 09/02/2020 Scheduled remote reviewed. Normal device function.  Known PAF, on [place].  AF burden is 9% of the time.  Ventricular auto capture increased to 5 volts.  Sent  to triage. Next remote 91 days. Kathy Breach, RN, CCDS, [employer]  DG Hip Unilat W or Wo Pelvis 2-3 Views Right  Result Date: 09/05/2020 CLINICAL DATA:  Acute RIGHT hip pain following fall today. Initial encounter. EXAM: DG HIP (WITH OR WITHOUT PELVIS) 2-3V RIGHT COMPARISON:  06/13/2014 FINDINGS: There is mild cortical irregularity along the RIGHT SUPERIOR and INFERIOR pubic rami which has a different appearance since 06/13/2014 and may represent nondisplaced fractures. No other fracture, subluxation or dislocation identified. Degenerative changes in both hips are present. No suspicious focal bony lesions are present. IMPRESSION: Mild cortical irregularity of the RIGHT SUPERIOR and INFERIOR pubic rami which may represent nondisplaced fractures. Consider CT for further evaluation as clinically indicated. Electronically Signed   By: Margarette Canada M.D.   On: 09/05/2020 10:35

## 2020-09-06 NOTE — Evaluation (Signed)
Physical Therapy Evaluation Patient Details Name: Stephen Cuevas MRN: [id number] DOB: 1926/01/01 Today's Date: 09/06/2020   History of Present Illness   E Jontrell Bushong is a 86 y.o. male with medical history significant of pacemaker placement; HTN; and atrial flutter on Eliquis presenting with a fall.  He got out of bed and toppled over, not sure why.  He urinated at 515 and at 715 he got up.  He hit the door frame and landed on the floor. Pt found to have R clavicle fx and pubic ramis fx.   Clinical Impression  Prior to admission, patient lived at a ILF and reports being modI with mobility using a SPC. Patient presents with above diagnoses, increased pain, generalized weakness, decreased activity tolerance, impaired functional mobility. Patient performed sit to stand transfer x3 with maxAx2 with use of SPC initially, used STEDY for bed>chair transfer. Overall, patient required maxAx2 for all mobility and transfers. Patient will benefit from skilled PT services during acute stay to address listed deficits. Recommend SNF following discharge to maximize functional independence.     Follow Up Recommendations SNF    Equipment Recommendations   (defer to post acute rehab)    Recommendations for Other Services       Precautions / Restrictions Precautions Precautions: Fall Precaution Comments: pacemaker Required Braces or Orthoses: Sling Restrictions Weight Bearing Restrictions: Yes RUE Weight Bearing: Non weight bearing RLE Weight Bearing: Weight bearing as tolerated Other Position/Activity Restrictions: no shoulder ROM at this time      Mobility  Bed Mobility Overal bed mobility: Needs Assistance Bed Mobility: Supine to Sit     Supine to sit: Max assist;+2 for physical assistance;+2 for safety/equipment;HOB elevated     General bed mobility comments: Max A x 2, assistance needed to advance B LE and heavy assist for trunk. pt able to reach bed rail with L UE to assist      Transfers Overall transfer level: Needs assistance Equipment used: Straight cane;Ambulation equipment used Transfers: Sit to/from Stand Sit to Stand: Max assist;+2 physical assistance;+2 safety/equipment;From elevated surface         General transfer comment: maxA x2 required for sit to stand x3 using cane initially, R knee buckling noted. Used STEDY for bed>chair transfer with maxAx2  Ambulation/Gait                Stairs            Wheelchair Mobility    Modified Rankin (Stroke Patients Only)       Balance Overall balance assessment: Needs assistance;History of Falls Sitting-balance support: Single extremity supported;Feet supported Sitting balance-Leahy Scale: Fair     Standing balance support: Single extremity supported;During functional activity Standing balance-Leahy Scale: Poor Standing balance comment: reliant on external support for balance                             Pertinent Vitals/Pain Pain Assessment: Faces Faces Pain Scale: Hurts even more Pain Location: L hip, L shoulder Pain Descriptors / Indicators: Grimacing;Guarding;Sore Pain Intervention(s): Limited activity within patient's tolerance;Monitored during session;Repositioned;Premedicated before session    [place] expects to be discharged to:: Other (Comment) (independent living facility) Living Arrangements: Alone Available Help at Discharge: Family Type of Home: Independent living facility Home Access: Level entry     Home Layout: One level Home Equipment: Clinical cytogeneticist - 4 wheels;Cane - single point      Prior Function Level of Independence: Independent with  assistive device(s);Needs assistance   Gait / Transfers Assistance Needed: Pt typically uses a SPC for mobility  ADL's / Homemaking Assistance Needed: Pt reports Modified Independent for ADLs (was standing for showers), does his own laundry and can cook microwave meals. ILF can provide  meals as needed        Hand Dominance   Dominant Hand: Right    Extremity/Trunk Assessment   Upper Extremity Assessment Upper Extremity Assessment: Defer to OT evaluation RUE Deficits / Details: R UE in sling, hand, wrist elbow AROM WFL RUE: Unable to fully assess due to immobilization;Unable to fully assess due to pain RUE Sensation: WNL RUE Coordination: decreased gross motor    Lower Extremity Assessment Lower Extremity Assessment: Generalized weakness    Cervical / Trunk Assessment Cervical / Trunk Assessment: Kyphotic  Communication   Communication: HOH  Cognition Arousal/Alertness: Awake/alert Behavior During Therapy: WFL for tasks assessed/performed Overall Cognitive Status: Within Functional Limits for tasks assessed                                        General Comments General comments (skin integrity, edema, etc.): Daughter in law present during session (retired PTA). Helpful/attentive throughout session. Assisted with repositioning patient in chair    Exercises     Assessment/Plan    PT Assessment Patient needs continued PT services  PT Problem List Decreased strength;Decreased range of motion;Decreased activity tolerance;Decreased balance;Decreased mobility;Pain       PT Treatment Interventions Gait training;Functional mobility training;Therapeutic exercise;Therapeutic activities;Balance training;Patient/family education    PT Goals (Current goals can be found in the Care Plan section)  Acute Rehab PT Goals Patient Stated Goal: be able to stand, rehab at [facility]" PT Goal Formulation: With patient/family Time For Goal Achievement: 09/20/20 Potential to Achieve Goals: Fair    Frequency Min 3X/week   Barriers to discharge        Co-evaluation PT/OT/SLP Co-Evaluation/Treatment: Yes Reason for Co-Treatment: Complexity of the patient's impairments (multi-system involvement);For patient/therapist safety;To address  functional/ADL transfers PT goals addressed during session: Mobility/safety with mobility OT goals addressed during session: ADL's and self-care       AM-PAC PT "6 Clicks" Mobility  Outcome Measure Help needed turning from your back to your side while in a flat bed without using bedrails?: A Lot Help needed moving from lying on your back to sitting on the side of a flat bed without using bedrails?: A Lot Help needed moving to and from a bed to a chair (including a wheelchair)?: A Lot Help needed standing up from a chair using your arms (e.g., wheelchair or bedside chair)?: A Lot Help needed to walk in hospital room?: A Lot Help needed climbing 3-5 steps with a railing? : Total 6 Click Score: 11    End of Session Equipment Utilized During Treatment: Gait belt;Other (comment) (STEDY) Activity Tolerance: Patient limited by pain Patient left: in chair;with call bell/phone within reach;with chair alarm set;with family/visitor present Nurse Communication: Mobility status PT Visit Diagnosis: Unsteadiness on feet (R26.81);Other abnormalities of gait and mobility (R26.89);Muscle weakness (generalized) (M62.81);History of falling (Z91.81);Pain Pain - Right/Left: Right Pain - part of body: Shoulder;Hip    Time: 1013-1100 PT Time Calculation (min) (ACUTE ONLY): 47 min   Charges:   PT Evaluation $PT Eval Moderate Complexity: 1 Mod PT Treatments $Therapeutic Activity: 8-22 mins        Kemyah Buser Allred, PT, DPT Acute Rehabilitation Services  Pager [phone] Office [phone]   Chesley Noon K Allred 09/06/2020, 1:08 PM

## 2020-09-06 NOTE — Evaluation (Addendum)
Occupational Therapy Evaluation Patient Details Name: Stephen Cuevas MRN: [id number] DOB: August 17, 1926 Today's Date: 09/06/2020    History of Present Illness  Stephen Cuevas is a 86 y.o. male with medical history significant of pacemaker placement; HTN; and atrial flutter on Eliquis presenting with a fall.  He got out of bed and toppled over, not sure why.  He urinated at 515 and at 715 he got up.  He hit the door frame and landed on the floor. Pt found to have R clavicle fx and pubic ramis fx.    Clinical Impression   PTA, pt resides at an ILF and reports Modified Independent with ADLs and mobility using cane. Pt able to complete simple IADLs without assistance. Pt presents now with diagnoses above and deficits in strength, endurance, pain, and standing balance. Pt also limited due to impaired function in dominant UE. Pt overall Max A x 2 for bed mobility, sit to stand trials with cane and ultimately transferred to chair with Stedy due to B knees buckling. Pt requires Max A for UB ADLs and Total A for LB ADLs due to deficits. Educated pt on sling mgmt and AROM exercises for hand, wrist and elbow.Pt pleasant and motivated to participate in therapy. Recommend DC to SNF associated with pt's ILF to maximize independence.     Follow Up Recommendations  SNF    Equipment Recommendations  3 in 1 bedside commode    Recommendations for Other Services       Precautions / Restrictions Precautions Precautions: Fall Precaution Comments: pacemaker Required Braces or Orthoses: Sling Restrictions Weight Bearing Restrictions: Yes RUE Weight Bearing: Non weight bearing Other Position/Activity Restrictions: WBAT LE, no shoulder ROM at this time      Mobility Bed Mobility Overal bed mobility: Needs Assistance Bed Mobility: Supine to Sit     Supine to sit: Max assist;+2 for physical assistance;+2 for safety/equipment;HOB elevated     General bed mobility comments: Max A x 2, assistance needed  to advance B LE and heavy assist for trunk. pt able to reach bed rail with L UE to assist     Transfers Overall transfer level: Needs assistance Equipment used: Straight cane;Ambulation equipment used Transfers: Sit to/from Stand Sit to Stand: Max assist;+2 physical assistance;+2 safety/equipment;From elevated surface         General transfer comment: Pt Max A x 2 for sit to stand (3 trials) using cane initially. Pt knees noted to buckle so used Stedy for transfer to chair at Max A x 2    Balance Overall balance assessment: Needs assistance;History of Falls Sitting-balance support: Single extremity supported;Feet supported Sitting balance-Leahy Scale: Fair     Standing balance support: Single extremity supported;During functional activity Standing balance-Leahy Scale: Poor Standing balance comment: reliant on external support for balance                           ADL either performed or assessed with clinical judgement   ADL Overall ADL's : Needs assistance/impaired Eating/Feeding: Set up;Sitting   Grooming: Minimal assistance;Sitting   Upper Body Bathing: Maximal assistance;Sitting   Lower Body Bathing: Maximal assistance;Sitting/lateral leans;Sit to/from stand   Upper Body Dressing : Maximal assistance;Sitting Upper Body Dressing Details (indicate cue type and reason): Max A for mgmt of sling and hospital gown sitting EOB Lower Body Dressing: Sit to/from stand;Sitting/lateral leans;Total assistance   Toilet Transfer: Maximal assistance;+2 for physical assistance;+2 for safety/equipment Toilet Transfer Details (indicate cue type and  reason): Use of Doctor, hospital and Hygiene: Total assistance;Sit to/from stand;Bed level;Sitting/lateral lean         General ADL Comments: Pt limited by pain, weakness and immobilized dominant UE     Vision Baseline Vision/History: Wears glasses Wears Glasses: Reading only Patient Visual Report: No  change from baseline Vision Assessment?: No apparent visual deficits     Perception     Praxis      Pertinent Vitals/Pain Pain Assessment: Faces Faces Pain Scale: Hurts even more Pain Location: L hip, L shoulder Pain Descriptors / Indicators: Grimacing;Guarding;Sore Pain Intervention(s): Limited activity within patient's tolerance;Monitored during session;Premedicated before session;Repositioned     Hand Dominance Right   Extremity/Trunk Assessment Upper Extremity Assessment Upper Extremity Assessment: RUE deficits/detail RUE Deficits / Details: R UE in sling, hand, wrist elbow AROM WFL RUE: Unable to fully assess due to immobilization;Unable to fully assess due to pain RUE Sensation: WNL RUE Coordination: decreased gross motor   Lower Extremity Assessment Lower Extremity Assessment: Defer to PT evaluation   Cervical / Trunk Assessment Cervical / Trunk Assessment: Kyphotic   Communication Communication Communication: HOH   Cognition Arousal/Alertness: Awake/alert Behavior During Therapy: WFL for tasks assessed/performed Overall Cognitive Status: Within Functional Limits for tasks assessed                                     General Comments  Pt's daughter in law entering during session (is a PTA) and helpful/attentive throughout session. Assisted in repositioning pt in chair.     Exercises     Shoulder Instructions      Home Living Family/patient expects to be discharged to:: Other (Comment) (independent living facility ) Living Arrangements: Alone Available Help at Discharge: Family Type of Home: Independent living facility Home Access: Level entry     Home Layout: One level     Bathroom Shower/Tub: Occupational psychologist: Handicapped height     Home Equipment: Clinical cytogeneticist - 4 wheels;Cane - single point          Prior Functioning/Environment Level of Independence: Independent with assistive device(s);Needs  assistance  Gait / Transfers Assistance Needed: Pt typically uses a SPC for mobility ADL's / Homemaking Assistance Needed: Pt reports Modified Independent for ADLs (was standing for showers), does his own laundry and can cook microwave meals. ILF can provide meals as needed Communication / Swallowing Assistance Needed: HOH          OT Problem List: Decreased strength;Decreased activity tolerance;Impaired balance (sitting and/or standing);Decreased knowledge of use of DME or AE;Pain      OT Treatment/Interventions: Self-care/ADL training;Therapeutic exercise;Energy conservation;DME and/or AE instruction;Therapeutic activities;Patient/family education    OT Goals(Current goals can be found in the care plan section) Acute Rehab OT Goals Patient Stated Goal: be able to stand, rehab at [facility]" OT Goal Formulation: With patient/family Time For Goal Achievement: 09/20/20 Potential to Achieve Goals: Good ADL Goals Pt Will Perform Grooming: with modified independence;sitting Pt Will Perform Upper Body Bathing: with min assist;sitting Pt Will Perform Upper Body Dressing: with min assist;sitting Pt Will Perform Lower Body Dressing: with min assist;sit to/from stand;sitting/lateral leans Pt Will Transfer to Toilet: with mod assist;bedside commode;stand pivot transfer  OT Frequency: Min 2X/week   Barriers to D/C:            Co-evaluation PT/OT/SLP Co-Evaluation/Treatment: Yes Reason for Co-Treatment: Complexity of the patient's impairments (multi-system involvement);For patient/therapist safety;To  address functional/ADL transfers   OT goals addressed during session: ADL's and self-care      AM-PAC OT "6 Clicks" Daily Activity     Outcome Measure Help from another person eating meals?: A Little Help from another person taking care of personal grooming?: A Little Help from another person toileting, which includes using toliet, bedpan, or urinal?: Total Help from another person  bathing (including washing, rinsing, drying)?: A Lot Help from another person to put on and taking off regular upper body clothing?: A Lot Help from another person to put on and taking off regular lower body clothing?: A Lot 6 Click Score: 13   End of Session Equipment Utilized During Treatment: Gait belt;Other (comment) (sling, stedy, cane) Nurse Communication: Mobility status;Need for lift equipment  Activity Tolerance: Patient tolerated treatment well Patient left: in chair;with call bell/phone within reach;with chair alarm set;with family/visitor present  OT Visit Diagnosis: Unsteadiness on feet (R26.81);Other abnormalities of gait and mobility (R26.89);Muscle weakness (generalized) (M62.81);History of falling (Z91.81);Pain Pain - Right/Left: Right Pain - part of body: Shoulder;Hip                Time: 1010-1051 OT Time Calculation (min): 41 min Charges:  OT General Charges $OT Visit: 1 Visit OT Evaluation $OT Eval Moderate Complexity: 1 Mod OT Treatments $Self Care/Home Management : 8-22 mins  Layla Maw, OTR/L  Layla Maw 09/06/2020, 12:54 PM

## 2020-09-06 NOTE — Plan of Care (Signed)

## 2020-09-06 NOTE — Telephone Encounter (Signed)
Patient's daughter in law called stating patient had a fall Sunday morning and was admitted into the hospital.  He will not be able to make the appt he had schedule with D.r Camnitz on 10/26.  She is wondering if the adjust they wanted to do on his pacemaker can be done while he is in the hospital.  She wants to know if his pacemaker was the cause of the fall.

## 2020-09-06 NOTE — TOC CAGE-AID Note (Signed)
Transition of Care [facility]) - CAGE-AID Screening   Patient Details  Name: Stephen Cuevas MRN: [id number] Date of Birth: [birth year]  Transition of Care [facility]) CM/SW Contact:    Emeterio Reeve, [place] Phone Number: 09/06/2020, 12:59 PM   Clinical Narrative:  CSW met with pt at bedside. CSW introduced self and explained role at the hospital.  Pt denies alcohol use. Pt denies substance use. Pt did not need any resources at this time.    CAGE-AID Screening:    Have You Ever Felt You Ought to Cut Down on Your Drinking or Drug Use?: No Have People Annoyed You By Critizing Your Drinking Or Drug Use?: No Have You Felt Bad Or Guilty About Your Drinking Or Drug Use?: No Have You Ever Had a Drink or Used Drugs First Thing In The Morning to Steady Your Nerves or to Get Rid of a Hangover?: No CAGE-AID Score: 0  Substance Abuse Education Offered: Yes    Stephen Cuevas, [place] Social Worker [phone]

## 2020-09-06 NOTE — Progress Notes (Signed)
Initial Nutrition Assessment  DOCUMENTATION CODES:   Not applicable  INTERVENTION:   -Feeding assistance with meals -Ensure Enlive po TID, each supplement provides 350 kcal and 20 grams of protein -MVI with minerals daily  NUTRITION DIAGNOSIS:   Inadequate oral intake related to decreased appetite as evidenced by meal completion < 25%, per patient/family report.  GOAL:   Patient will meet greater than or equal to 90% of their needs  MONITOR:   PO intake, Supplement acceptance, Labs, Weight trends, Skin, I & O's  REASON FOR ASSESSMENT:   Consult Assessment of nutrition requirement/status  ASSESSMENT:   Stephen Cuevas is a 86 y.o. male with medical history significant of pacemaker placement; HTN; and atrial flutter on Eliquis presenting with a fall.  Pt admitted s/p fall resulting in rt clavicular fracture.   Reviewed I/O's: -210 ml x 24 hours  UOP: 450 ml x 24 hours  Per orthopedics notes, no plan for surgical intervention at this time.   Spoke with pt at bedside, who was pleasant and in good spirits today. He reports he typically has a fair appetite; he receives 3 meals per day, which are delivered to [facility] at [facility] (Breakfast: waffle, eggs, and orange juice; Lunch: soup and salad; Dinner: BLT). Pt shares that his appetite has declined since admission due to pain and difficulty feeding himself, as he is unable to use his rt hand (he is right hand dominant). Observed breakfast tray- pt consumed about 20%. Noted meal completions documented at 15%.   Pt does not think he has lsot weight. He reports UBW is around 160-165#. He was ambulating without difficulty PTA, living in independent living.   Discussed importance of good meal and supplement intake to promote healing. Pt amenable to Ensure supplements.   Medications reviewed and include cardizem, colace, remeron, and prednisone.   Labs reviewed.   NUTRITION - FOCUSED PHYSICAL EXAM:    Most Recent Value   Orbital Region No depletion  Upper Arm Region No depletion  Thoracic and Lumbar Region No depletion  Buccal Region No depletion  Temple Region No depletion  Clavicle Bone Region No depletion  Clavicle and Acromion Bone Region No depletion  Scapular Bone Region No depletion  Dorsal Hand No depletion  Patellar Region No depletion  Anterior Thigh Region No depletion  Posterior Calf Region No depletion  Edema (RD Assessment) Mild  Hair Reviewed  Eyes Reviewed  Mouth Reviewed  Skin Reviewed  Nails Reviewed       Diet Order:   Diet Order            Diet Heart Room service appropriate? Yes; Fluid consistency: Thin  Diet effective now                 EDUCATION NEEDS:   Education needs have been addressed  Skin:  Skin Assessment: Reviewed RN Assessment  Last BM:  Unknown  Height:   Ht Readings from Last 1 Encounters:  09/05/20 5\' 6"  (1.676 m)    Weight:   Wt Readings from Last 1 Encounters:  09/05/20 73.5 kg    Ideal Body Weight:  64.5 kg  BMI:  Body mass index is 26.15 kg/m.  Estimated Nutritional Needs:   Kcal:  1800-2000  Protein:  90-105 grams  Fluid:  > 1.8 L    Loistine Chance, RD, LDN, [place] Registered Dietitian II Certified Diabetes Care and Education Specialist Please refer to [facility] for RD and/or RD on-call/weekend/after hours pager

## 2020-09-06 NOTE — Telephone Encounter (Signed)
Pt was scheduled to see [name] on 09/07/20 to evaluate and reprogram his RV lead.  Device currently programmed with Auto capture on, results returned high causing output to increased to 5v.  Patient hospitalized at [facility] on 10/24 due to a fall.  Since pt is not at home we are unable to get data from device, will forward message to MD to determine if we can have someone check him in hospital.

## 2020-09-07 ENCOUNTER — Encounter: Payer: Medicare Other | Admitting: Cardiology

## 2020-09-07 MED ORDER — MELATONIN 5 MG PO TABS
10.0000 mg | ORAL_TABLET | Freq: Every day | ORAL | Status: DC
  Administered 2020-09-07 – 2020-09-08 (×2): 10 mg via ORAL
  Filled 2020-09-07 (×2): qty 2

## 2020-09-07 MED ORDER — KETOROLAC TROMETHAMINE 15 MG/ML IJ SOLN
15.0000 mg | Freq: Once | INTRAMUSCULAR | Status: AC
  Administered 2020-09-07: 15 mg via INTRAVENOUS
  Filled 2020-09-07: qty 1

## 2020-09-07 MED ORDER — TIMOLOL MALEATE 0.5 % OP SOLN
1.0000 [drp] | Freq: Every day | OPHTHALMIC | Status: DC
  Administered 2020-09-07 – 2020-09-09 (×3): 1 [drp] via OPHTHALMIC
  Filled 2020-09-07: qty 5

## 2020-09-07 NOTE — Consult Note (Signed)
Reason for Consult:Clav/pelvic fxs Referring Physician: [name] is an 86 y.o. male.  HPI: Stephen Cuevas got out of bed over the weekend and toppled over and fell. He's not really sure how it happened. He was brought to the ED where workup showed a right clavicle and rami fxs. He was admitted and orthopedic surgery was consulted. He lives in independent living at [facility] and usually ambulates with a RW.  Past Medical History:  Diagnosis Date  . Ankle edema   . Anxiety   . Dysrhythmia    a-fib/flutter- on eliquis  . GERD (gastroesophageal reflux disease)    no treatment  . Hard of hearing   . 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 Hypertension   . Kidney stone   . Presence of permanent cardiac pacemaker 06/03/2020  . Spinal stenosis   . Tachy-brady syndrome ([place]) 06/03/2020    Past Surgical History:  Procedure Laterality Date  . addnoid    . CYSTOSCOPY W/ URETEROSCOPY  1980  . INSERT / REPLACE / REMOVE PACEMAKER  06/03/2020  . KNEE SURGERY    . NAILBED REPAIR Left 06/27/2016   Procedure: NAILBED biopsy left thumb;  Surgeon: Daryll Brod, MD;  Location: [facility];  Service: Orthopedics;  Laterality: Left;  FAB  . PACEMAKER IMPLANT N/A 06/03/2020   Procedure: PACEMAKER IMPLANT;  Surgeon: Constance Haw, MD;  Location: [facility];  Service: Cardiovascular;  Laterality: N/A;  . TONSILLECTOMY      Family History  Problem Relation Age of Onset  . Heart failure Father   . Heart disease Father     Social History:  reports that he quit smoking about 64 years ago. His smoking use included cigarettes, pipe, and cigars. He has a 30.00 pack-year smoking history. He has never used smokeless tobacco. He reports current alcohol use. He reports that he does not use drugs.  Allergies: No Known Allergies  Medications: I have reviewed the patient's current medications.  Results for orders placed or performed during the hospital encounter of 09/05/20 (from  the past 48 hour(s))  Respiratory Panel by RT PCR (Flu A&B, Covid) - Nasopharyngeal Swab     Status: None   Collection Time: 09/05/20 11:00 AM   Specimen: Nasopharyngeal Swab  Result Value Ref Range   SARS Coronavirus 2 by RT PCR NEGATIVE NEGATIVE    Comment: (NOTE) SARS-CoV-2 target nucleic acids are NOT DETECTED.  The SARS-CoV-2 RNA is generally detectable in upper respiratoy specimens during the acute phase of infection. The lowest concentration of SARS-CoV-2 viral copies this assay can detect is 131 copies/mL. A negative result does not preclude SARS-Cov-2 infection and should not be used as the sole basis for treatment or other patient management decisions. A negative result may occur with  improper specimen collection/handling, submission of specimen other than nasopharyngeal swab, presence of viral mutation(s) within the areas targeted by this assay, and inadequate number of viral copies (<131 copies/mL). A negative result must be combined with clinical observations, patient history, and epidemiological information. The expected result is Negative.  Fact Sheet for Patients:  PinkCheek.be  Fact Sheet for Healthcare Providers:  GravelBags.it  This test is no t yet approved or cleared by the [place] FDA and  has been authorized for detection and/or diagnosis of SARS-CoV-2 by FDA under an Emergency Use Authorization (EUA). This EUA will remain  in effect (meaning this test can be used) for the duration of the COVID-19 declaration under Section  564(b)(1) of the Act, 21 U.S.C. section 360bbb-3(b)(1), unless the authorization is terminated or revoked sooner.     Influenza A by PCR NEGATIVE NEGATIVE   Influenza B by PCR NEGATIVE NEGATIVE    Comment: (NOTE) The Xpert Xpress SARS-CoV-2/FLU/RSV assay is intended as an aid in  the diagnosis of influenza from Nasopharyngeal swab specimens and  should not be used as a  sole basis for treatment. Nasal washings and  aspirates are unacceptable for Xpert Xpress SARS-CoV-2/FLU/RSV  testing.  Fact Sheet for Patients: PinkCheek.be  Fact Sheet for Healthcare Providers: GravelBags.it  This test is not yet approved or cleared by the [place] FDA and  has been authorized for detection and/or diagnosis of SARS-CoV-2 by  FDA under an Emergency Use Authorization (EUA). This EUA will remain  in effect (meaning this test can be used) for the duration of the  Covid-19 declaration under Section 564(b)(1) of the Act, 21  U.S.C. section 360bbb-3(b)(1), unless the authorization is  terminated or revoked. Performed at [facility], [place] [address], [place], [place] [address]   Urinalysis, Routine w reflex microscopic Urine, Clean Catch     Status: Abnormal   Collection Time: 09/05/20  2:14 PM  Result Value Ref Range   Color, Urine YELLOW YELLOW   APPearance CLEAR CLEAR   Specific Gravity, Urine 1.016 1.005 - 1.030   pH 6.0 5.0 - 8.0   Glucose, UA NEGATIVE NEGATIVE mg/dL   Hgb urine dipstick NEGATIVE NEGATIVE   Bilirubin Urine NEGATIVE NEGATIVE   Ketones, ur 5 (A) NEGATIVE mg/dL   Protein, ur NEGATIVE NEGATIVE mg/dL   Nitrite NEGATIVE NEGATIVE   Leukocytes,Ua NEGATIVE NEGATIVE    Comment: Performed at [facility] [address] [place], [place] [address]  Basic metabolic panel     Status: Abnormal   Collection Time: 09/06/20  1:45 AM  Result Value Ref Range   Sodium 137 135 - 145 mmol/L   Potassium 4.0 3.5 - 5.1 mmol/L   Chloride 104 98 - 111 mmol/L   CO2 23 22 - 32 mmol/L   Glucose, Bld 95 70 - 99 mg/dL    Comment: Glucose reference range applies only to samples taken after fasting for at least 8 hours.   BUN 19 8 - 23 mg/dL   Creatinine, Ser 1.19 0.61 - 1.24 mg/dL   Calcium 8.5 (L) 8.9 - 10.3 mg/dL   GFR, Estimated 57 (L) >60 mL/min    Comment: (NOTE) Calculated using the  CKD-EPI Creatinine Equation (2021)    Anion gap 10 5 - 15    Comment: Performed at [facility] [address], [place], [place] [address]  CBC     Status: Abnormal   Collection Time: 09/06/20  1:45 AM  Result Value Ref Range   WBC 9.1 4.0 - 10.5 K/uL   RBC 3.60 (L) 4.22 - 5.81 MIL/uL   Hemoglobin 10.9 (L) 13.0 - 17.0 g/dL   HCT 33.3 (L) 39 - 52 %   MCV 92.5 80.0 - 100.0 fL   MCH 30.3 26.0 - 34.0 pg   MCHC 32.7 30.0 - 36.0 g/dL   RDW 13.9 11.5 - 15.5 %   Platelets 167 150 - 400 K/uL   nRBC 0.0 0.0 - 0.2 %    Comment: Performed at [facility], [place] [address] [place], [place] [address]    DG Chest 1 View  Result Date: 09/05/2020 CLINICAL DATA:  Fall with RIGHT chest pain. EXAM:  CHEST  1 VIEW COMPARISON:  06/04/2020 prior studies FINDINGS: Cardiomegaly and LEFT-sided pacemaker again noted. There is no evidence of focal airspace disease, pulmonary edema, suspicious pulmonary nodule/mass, pleural effusion, or pneumothorax. No acute bony abnormalities are identified. IMPRESSION: Cardiomegaly without evidence of acute cardiopulmonary disease. Electronically Signed   By: Margarette Canada M.D.   On: 09/05/2020 10:16   DG Clavicle Right  Result Date: 09/05/2020 CLINICAL DATA:  Acute RIGHT clavicle pain following fall today. Initial encounter. EXAM: RIGHT CLAVICLE - 2+ VIEWS COMPARISON:  Prior chest and shoulder radiographs FINDINGS: A vertical fracture of the mid RIGHT clavicle is noted with 3 mm distraction. No other acute bony abnormalities are noted. IMPRESSION: Minimally distracted mid RIGHT clavicle fracture. Electronically Signed   By: Margarette Canada M.D.   On: 09/05/2020 10:15   DG Shoulder Right  Result Date: 09/05/2020 CLINICAL DATA:  Fall with right shoulder pain EXAM: RIGHT SHOULDER - 2+ VIEW COMPARISON:  None. FINDINGS: Right mid clavicle fracture with branching component extending lateral to the level of the coracoclavicular interval. There is pending clavicle radiograph.  No glenohumeral fracture or dislocation. Located acromioclavicular joint. Prominent osteopenia IMPRESSION: Mid right clavicle fracture extending to the level of the coracoclavicular interval. Electronically Signed   By: Monte Fantasia M.D.   On: 09/05/2020 10:20   CT Head Wo Contrast  Result Date: 09/05/2020 CLINICAL DATA:  Status post fall. EXAM: CT HEAD WITHOUT CONTRAST CT CERVICAL SPINE WITHOUT CONTRAST TECHNIQUE: Multidetector CT imaging of the head and cervical spine was performed following the standard protocol without intravenous contrast. Multiplanar CT image reconstructions of the cervical spine were also generated. COMPARISON:  None. FINDINGS: CT HEAD FINDINGS Brain: Mild diffuse cortical atrophy is noted. Mild chronic ischemic white matter disease is noted. No mass effect or midline shift is noted. Ventricular size is within normal limits. There is no evidence of mass lesion, hemorrhage or acute infarction. Vascular: No hyperdense vessel or unexpected calcification. Skull: Normal. Negative for fracture or focal lesion. Sinuses/Orbits: No acute finding. Other: None. CT CERVICAL SPINE FINDINGS Alignment: Normal. Skull base and vertebrae: No acute fracture. No primary bone lesion or focal pathologic process. Soft tissues and spinal canal: No prevertebral fluid or swelling. No visible canal hematoma. Disc levels: Moderate degenerative disc disease is noted at C5-6 and C6-7. Upper chest: Negative. Other: Degenerative changes are seen involving posterior facet joints bilaterally. IMPRESSION: 1. Mild diffuse cortical atrophy. Mild chronic ischemic white matter disease. No acute intracranial abnormality seen. 2. Multilevel degenerative disc disease. No acute abnormality seen in the cervical spine. Electronically Signed   By: Marijo Conception M.D.   On: 09/05/2020 10:50   CT Cervical Spine Wo Contrast  Result Date: 09/05/2020 CLINICAL DATA:  Status post fall. EXAM: CT HEAD WITHOUT CONTRAST CT CERVICAL  SPINE WITHOUT CONTRAST TECHNIQUE: Multidetector CT imaging of the head and cervical spine was performed following the standard protocol without intravenous contrast. Multiplanar CT image reconstructions of the cervical spine were also generated. COMPARISON:  None. FINDINGS: CT HEAD FINDINGS Brain: Mild diffuse cortical atrophy is noted. Mild chronic ischemic white matter disease is noted. No mass effect or midline shift is noted. Ventricular size is within normal limits. There is no evidence of mass lesion, hemorrhage or acute infarction. Vascular: No hyperdense vessel or unexpected calcification. Skull: Normal. Negative for fracture or focal lesion. Sinuses/Orbits: No acute finding. Other: None. CT CERVICAL SPINE FINDINGS Alignment: Normal. Skull base and vertebrae: No acute fracture. No primary bone lesion or focal pathologic process.  Soft tissues and spinal canal: No prevertebral fluid or swelling. No visible canal hematoma. Disc levels: Moderate degenerative disc disease is noted at C5-6 and C6-7. Upper chest: Negative. Other: Degenerative changes are seen involving posterior facet joints bilaterally. IMPRESSION: 1. Mild diffuse cortical atrophy. Mild chronic ischemic white matter disease. No acute intracranial abnormality seen. 2. Multilevel degenerative disc disease. No acute abnormality seen in the cervical spine. Electronically Signed   By: Marijo Conception M.D.   On: 09/05/2020 10:50   CT PELVIS WO CONTRAST  Result Date: 09/05/2020 CLINICAL DATA:  Right hip pain after fall. EXAM: CT PELVIS WITHOUT CONTRAST TECHNIQUE: Multidetector CT imaging of the pelvis was performed following the standard protocol without intravenous contrast. COMPARISON:  September 16, 2012. FINDINGS: Urinary Tract:  No abnormality visualized. Bowel:  Unremarkable visualized pelvic bowel loops. Vascular/Lymphatic: No pathologically enlarged lymph nodes. No significant vascular abnormality seen. Reproductive:  No mass or other  significant abnormality Other: Stable large left inguinal hernia is noted which contains a loop of colon, but does not result in obstruction. Stable large fat containing right inguinal hernia is noted as well. No ascites is noted. Musculoskeletal: Probable acute nondisplaced fracture is seen involving the right superior pubic ramus which extends posteriorly toward the medial acetabular area, although does not appear to involve the articular surface of the right hip. IMPRESSION: 1. Probable acute nondisplaced fracture is seen involving the right superior pubic ramus which extends posteriorly toward the medial acetabular area, although does not appear to involve the articular surface of the right hip. 2. Stable large left inguinal hernia is noted which contains a loop of colon, but does not result in obstruction. 3. Stable large fat containing right inguinal hernia is noted as well. Electronically Signed   By: Marijo Conception M.D.   On: 09/05/2020 11:57   DG Knee Complete 4 Views Right  Result Date: 09/05/2020 CLINICAL DATA:  Acute RIGHT knee pain following fall today. Initial encounter. EXAM: RIGHT KNEE - COMPLETE 4+ VIEW COMPARISON:  None. FINDINGS: No acute fracture, dislocation or joint effusion. Mild joint space narrowing osteophytosis in all 3 compartments noted. No focal bony lesions are present. Vascular calcifications are identified. IMPRESSION: 1. No acute abnormality. 2. Mild tricompartmental degenerative changes. Electronically Signed   By: Margarette Canada M.D.   On: 09/05/2020 10:24   DG Hip Unilat W or Wo Pelvis 2-3 Views Right  Result Date: 09/05/2020 CLINICAL DATA:  Acute RIGHT hip pain following fall today. Initial encounter. EXAM: DG HIP (WITH OR WITHOUT PELVIS) 2-3V RIGHT COMPARISON:  06/13/2014 FINDINGS: There is mild cortical irregularity along the RIGHT SUPERIOR and INFERIOR pubic rami which has a different appearance since 06/13/2014 and may represent nondisplaced fractures. No other  fracture, subluxation or dislocation identified. Degenerative changes in both hips are present. No suspicious focal bony lesions are present. IMPRESSION: Mild cortical irregularity of the RIGHT SUPERIOR and INFERIOR pubic rami which may represent nondisplaced fractures. Consider CT for further evaluation as clinically indicated. Electronically Signed   By: Margarette Canada M.D.   On: 09/05/2020 10:35    Review of Systems  HENT: Negative for ear discharge, ear pain, hearing loss and tinnitus.   Eyes: Negative for photophobia and pain.  Respiratory: Negative for cough and shortness of breath.   Cardiovascular: Negative for chest pain.  Gastrointestinal: Positive for constipation. Negative for abdominal pain, nausea and vomiting.  Genitourinary: Negative for dysuria, flank pain, frequency and urgency.  Musculoskeletal: Positive for arthralgias (Right shoulder, right hip). Negative  for back pain, myalgias and neck pain.  Neurological: Negative for dizziness and headaches.  Hematological: Does not bruise/bleed easily.  Psychiatric/Behavioral: The patient is not nervous/anxious.    Blood pressure 112/66, pulse 72, temperature 97.9 F (36.6 C), temperature source Oral, resp. rate 17, height 5\' 6"  (1.676 m), weight 73.5 kg, SpO2 95 %. Physical Exam Constitutional:      General: He is not in acute distress.    Appearance: He is well-developed. He is not diaphoretic.  HENT:     Head: Normocephalic and atraumatic.  Eyes:     General: No scleral icterus.       Right eye: No discharge.        Left eye: No discharge.     Conjunctiva/sclera: Conjunctivae normal.  Cardiovascular:     Rate and Rhythm: Normal rate and regular rhythm.  Pulmonary:     Effort: Pulmonary effort is normal. No respiratory distress.  Musculoskeletal:     Cervical back: Normal range of motion.     Comments: Right shoulder, elbow, wrist, digits- no skin wounds, in sling, no instability, no blocks to motion  Sens  Ax/R/M/U  intact  Mot   Ax/ R/ PIN/ M/ AIN/ U intact  Rad 2+  RLE No traumatic wounds, ecchymosis, or rash  Mild TTP hip  No knee or ankle effusion  Knee stable to varus/ valgus and anterior/posterior stress  Sens DPN, SPN, TN intact  Motor EHL, ext, flex, evers 5/5  DP 1+, PT 1+, No significant edema  Skin:    General: Skin is warm and dry.  Neurological:     Mental Status: He is alert.  Psychiatric:        Behavior: Behavior normal.     Assessment/Plan: Right clav fx -- Sling, NWB. Appears we will be able to treat non-operatively. Right sup/inf pubic rami fxs -- WBAT. F/u with [name] in 2 weeks. Multiple medical problems including a flutter w/pacer on Eliquis and HTN -- per primary service    Lisette Abu, PA-C Orthopedic Surgery [phone] 09/07/2020, 9:24 AM

## 2020-09-07 NOTE — Progress Notes (Addendum)
PROGRESS NOTE    Stephen Cuevas  OAC:[id number] DOB: 03/20/1926 DOA: 09/05/2020 PCP: [name], DO   Brief Narrative: 86 year old male with history of pacemaker placement, hypertension, a flutter on Eliquis presents with fall at home.  He got out of the bed and Toppled over,denied losing consciousness, reports hitting his head. Patient reports he had some pacemaker issues and was supposed to follow-up with [name] last week and patient was rescheduled for 10/26, He was seen in the ED and found to have right clavicle and right pubic ramus fracture   Subjective: This morning complains of ongoing pain not helped by morphine but oral Vicodin helping some.  His daughter at the bedside.   No chest pain nausea vomiting fever or chills.  Assessment & Plan:  Fall at home, initial encounter right clavicle and right.  Ramus fracture:cont pt/ot, will need SNF.  Right clavicle and right pubic ramus fracture appreciate orthopedic input, continue sling nonweightbearing per rectum fracture with nonoperative management.  Continue WBAT and to follow-up with [name] for right superior/inferior Pubic ramus fracture.  Continue Vicodin/morphine for pain control try Toradol x1 today  Pacemaker in place and is being interrogated, notified cardiology.  Essential hypertension: BP controlled on Cardizem and HCTZ.  Atrial fibrillation, hx: CHA2DS2-VASc score greater than 3 continue Cardizem, Eliquis.  Followed by Dr. [name].  Anemia 10 to 12 g range.  Unclear etiology.  Monitor  On chronic prednisone  Nutrition: Diet Order            Diet Heart Room service appropriate? Yes with Assist; Fluid consistency: Thin  Diet effective now                 Nutrition Problem: Inadequate oral intake Etiology: decreased appetite Signs/Symptoms: meal completion < 25%, per patient/family report Interventions: Ensure Enlive (each supplement provides 350kcal and 20 grams of protein), MVI  Body mass index  is 26.15 kg/m.  Pressure Ulcer:    DVT prophylaxis: apixaban (ELIQUIS) tablet 2.5 mg Start: 09/05/20 2200 Code Status:   Code Status: DNR  Family Communication: plan of care discussed with patient and his daughter at bedside.  Status is: Inpatient Remains inpatient appropriate because:IV treatments appropriate due to intensity of illness or inability to take PO and Inpatient level of care appropriate due to severity of illness  Dispo: The patient is from: Home              Anticipated d/c is to: SNF              Anticipated d/c date is: 1 day              Patient currently is not medically stable to d/c. Consultants:see note  Procedures:see note  Culture/Microbiology No results found for: SDES, SPECREQUEST, CULT, REPTSTATUS  Other culture-see note  Medications: Scheduled Meds: . apixaban  2.5 mg Oral BID  . atorvastatin  10 mg Oral QPM  . diltiazem  120 mg Oral Daily  . docusate sodium  100 mg Oral BID  . feeding supplement  237 mL Oral BID BM  . hydrochlorothiazide  12.5 mg Oral Daily  . melatonin  10 mg Oral QHS  . mirtazapine  7.5 mg Oral QHS  . multivitamin with minerals  1 tablet Oral Daily  . pantoprazole  40 mg Oral Daily  . predniSONE  2 mg Oral Q breakfast  . tamsulosin  0.4 mg Oral Daily  . timolol  1 drop Both Eyes Daily   Continuous Infusions: .  methocarbamol (ROBAXIN) IV      Antimicrobials: Anti-infectives (From admission, onward)   None     Objective: Vitals: Today's Vitals   09/07/20 0300 09/07/20 0510 09/07/20 0757 09/07/20 1216  BP: 132/66  112/66   Pulse: 62  72   Resp: 18  17   Temp: 98.7 F (37.1 C)  97.9 F (36.6 C)   TempSrc: Oral  Oral   SpO2: 97%  95%   Weight:      Height:      PainSc:  6   9     Intake/Output Summary (Last 24 hours) at 09/07/2020 1302 Last data filed at 09/07/2020 0950 Gross per 24 hour  Intake 480 ml  Output --  Net 480 ml   Filed Weights   09/05/20 0924  Weight: 73.5 kg   Weight change:    Intake/Output from previous day: 10/25 0701 - 10/26 0700 In: 960 [P.O.:960] Out: 400 [Urine:400] Intake/Output this shift: Total I/O In: 240 [P.O.:240] Out: -   Examination: General exam: AAOx3 ,NAD, weak appearing. HEENT:Oral mucosa moist, Ear/Nose WNL grossly,dentition normal. Respiratory system: bilaterally clear,no wheezing or crackles,no use of accessory muscle, non tender. Cardiovascular system: S1 & S2 +, regular, No JVD. Gastrointestinal system: Abdomen soft, NT,ND, BS+. Nervous System:Alert, awake, moving extremities and grossly nonfocal Extremities: Right arm in sling in place no edema, distal peripheral pulses palpable.  Skin: No rashes,no icterus. MSK: Normal muscle bulk,tone, power  Data Reviewed: I have personally reviewed following labs and imaging studies CBC: Recent Labs  Lab 09/05/20 0907 09/05/20 0913 09/06/20 0145  WBC 11.3*  --  9.1  NEUTROABS 8.2*  --   --   HGB 12.3* 12.2* 10.9*  HCT 38.2* 36.0* 33.3*  MCV 92.9  --  92.5  PLT 185  --  518   Basic Metabolic Panel: Recent Labs  Lab 09/05/20 0907 09/05/20 0913 09/06/20 0145  NA 139 141 137  K 4.8 3.9 4.0  CL 105 103 104  CO2 23  --  23  GLUCOSE 109* 105* 95  BUN 17 19 19   CREATININE 1.24 1.20 1.19  CALCIUM 8.7*  --  8.5*   GFR: Estimated Creatinine Clearance: 35.7 mL/min (by C-G formula based on SCr of 1.19 mg/dL). Liver Function Tests: No results for input(s): AST, ALT, ALKPHOS, BILITOT, PROT, ALBUMIN in the last 168 hours. No results for input(s): LIPASE, AMYLASE in the last 168 hours. No results for input(s): AMMONIA in the last 168 hours. Coagulation Profile: No results for input(s): INR, PROTIME in the last 168 hours. Cardiac Enzymes: No results for input(s): CKTOTAL, CKMB, CKMBINDEX, TROPONINI in the last 168 hours. BNP (last 3 results) No results for input(s): PROBNP in the last 8760 hours. HbA1C: No results for input(s): HGBA1C in the last 72 hours. CBG: No results for  input(s): GLUCAP in the last 168 hours. Lipid Profile: No results for input(s): CHOL, HDL, LDLCALC, TRIG, CHOLHDL, LDLDIRECT in the last 72 hours. Thyroid Function Tests: No results for input(s): TSH, T4TOTAL, FREET4, T3FREE, THYROIDAB in the last 72 hours. Anemia Panel: No results for input(s): VITAMINB12, FOLATE, FERRITIN, TIBC, IRON, RETICCTPCT in the last 72 hours. Sepsis Labs: No results for input(s): PROCALCITON, LATICACIDVEN in the last 168 hours.  Recent Results (from the past 240 hour(s))  Respiratory Panel by RT PCR (Flu A&B, Covid) - Nasopharyngeal Swab     Status: None   Collection Time: 09/05/20 11:00 AM   Specimen: Nasopharyngeal Swab  Result Value Ref Range Status  SARS Coronavirus 2 by RT PCR NEGATIVE NEGATIVE Final    Comment: (NOTE) SARS-CoV-2 target nucleic acids are NOT DETECTED.  The SARS-CoV-2 RNA is generally detectable in upper respiratoy specimens during the acute phase of infection. The lowest concentration of SARS-CoV-2 viral copies this assay can detect is 131 copies/mL. A negative result does not preclude SARS-Cov-2 infection and should not be used as the sole basis for treatment or other patient management decisions. A negative result may occur with  improper specimen collection/handling, submission of specimen other than nasopharyngeal swab, presence of viral mutation(s) within the areas targeted by this assay, and inadequate number of viral copies (<131 copies/mL). A negative result must be combined with clinical observations, patient history, and epidemiological information. The expected result is Negative.  Fact Sheet for Patients:  PinkCheek.be  Fact Sheet for Healthcare Providers:  GravelBags.it  This test is no t yet approved or cleared by the [place] FDA and  has been authorized for detection and/or diagnosis of SARS-CoV-2 by FDA under an Emergency Use Authorization (EUA).  This EUA will remain  in effect (meaning this test can be used) for the duration of the COVID-19 declaration under Section 564(b)(1) of the Act, 21 U.S.C. section 360bbb-3(b)(1), unless the authorization is terminated or revoked sooner.     Influenza A by PCR NEGATIVE NEGATIVE Final   Influenza B by PCR NEGATIVE NEGATIVE Final    Comment: (NOTE) The Xpert Xpress SARS-CoV-2/FLU/RSV assay is intended as an aid in  the diagnosis of influenza from Nasopharyngeal swab specimens and  should not be used as a sole basis for treatment. Nasal washings and  aspirates are unacceptable for Xpert Xpress SARS-CoV-2/FLU/RSV  testing.  Fact Sheet for Patients: PinkCheek.be  Fact Sheet for Healthcare Providers: GravelBags.it  This test is not yet approved or cleared by the [place] FDA and  has been authorized for detection and/or diagnosis of SARS-CoV-2 by  FDA under an Emergency Use Authorization (EUA). This EUA will remain  in effect (meaning this test can be used) for the duration of the  Covid-19 declaration under Section 564(b)(1) of the Act, 21  U.S.C. section 360bbb-3(b)(1), unless the authorization is  terminated or revoked. Performed at [facility], [place] [address], [place], [place] [address]      Radiology Studies: No results found.   LOS: 2 days   Antonieta Pert, MD [facility]  09/07/2020, 1:02 PM

## 2020-09-07 NOTE — Plan of Care (Signed)

## 2020-09-07 NOTE — TOC Initial Note (Signed)
Transition of Care [facility]) - Initial/Assessment Note    Patient Details  Name: Stephen Cuevas MRN: [id number] Date of Birth: 1926-04-06  Transition of Care [facility]) CM/SW Contact:    Emeterio Reeve, [place] Phone Number: 09/07/2020, 3:08 PM  Clinical Narrative:                  CSW met with pt at bedside. CSW introduced self and explained her role at the hospital.  [facility] [place]. Pt sates he was independent but used a cane to walk around.   CSW reviewed pt/ot reccs of snf. PT and daughter stated [facility]. CSW [facility] to [facility] and they are able to take pt at discharge.   Expected Discharge Plan: Skilled Nursing Facility Barriers to Discharge: Continued Medical Work up   Patient Goals and CMS Choice Patient states their goals for this hospitalization and ongoing recovery are:: Get back to ILF CMS Medicare.gov Compare Post Acute Care list provided to:: Patient Choice offered to / list presented to : Patient  Expected Discharge Plan and Services Expected Discharge Plan: [place] arrangements for the past 2 months: [place]                                      Prior Living Arrangements/Services Living arrangements for the past 2 months: [place] Lives with:: Self Patient language and need for interpreter reviewed:: Yes        Need for Family Participation in Patient Care: Yes (Comment) Care giver support system in place?: Yes (comment)   Criminal Activity/Legal Involvement Pertinent to Current Situation/Hospitalization: No - Comment as needed  Activities of Daily Living      Permission Sought/Granted   Permission granted to share information with : Yes, Verbal Permission Granted     Permission granted to share info w AGENCY: SNF        Emotional Assessment Appearance:: Appears stated age Attitude/Demeanor/Rapport: Engaged Affect  (typically observed): Appropriate Orientation: : Oriented to Self, Oriented to Place, Oriented to  Time, Oriented to Situation Alcohol / Substance Use: Not Applicable Psych Involvement: No (comment)  Admission diagnosis:  Trauma [T14.90XA] Pain [R52] Closed nondisplaced fracture of shaft of right clavicle, initial encounter [S42.024A] Fall at home, initial encounter [W19.XXXA, Y92.009] Closed nondisplaced fracture of pelvis, unspecified part of pelvis, initial encounter ([place]) [S32.9XXA] Patient Active Problem List   Diagnosis Date Noted  . Fall at home, initial encounter 09/05/2020  . Essential hypertension 09/05/2020  . Atrial fibrillation, chronic ([place]) 09/05/2020  . Closed traumatic minimally displaced fracture of shaft of right clavicle 09/05/2020  . Pubic ramus fracture, right, closed, initial encounter ([place]) 09/05/2020  . Heart block AV complete ([place]) 07/31/2020  . Tachy-brady syndrome ([place]) 06/03/2020  . [place] MRI Dual Chamber pacemaker 06/03/2018 06/03/2020  . Bilateral inguinal hernia 04/23/2013   PCP:  [name], DO Pharmacy:   [id number] [place], [place] [address] Phone: [phone] Fax: [phone]  [place], [place] [address] [place] [address] Phone: [phone] Fax: [phone]     Social Determinants of Health (SDOH) Interventions    Readmission Risk Interventions No flowsheet data found.  Emeterio Reeve, Latanya Presser, [place] Social Worker [phone]

## 2020-09-07 NOTE — Telephone Encounter (Signed)
No DPR on file for caller.    Spoke with [name], he advised to have industry check patient in hospital.  Message sent to [place] with patient information to check device.  Left general message for Karna Christmas advising her request was received and we are working with the doctor to manage patient device.

## 2020-09-07 NOTE — NC FL2 (Signed)
[place] LEVEL OF CARE SCREENING TOOL     IDENTIFICATION  Patient Name: Stephen Cuevas Birthdate: May 29, 1926 Sex: male Admission Date (Current Location): 09/05/2020  [facility] and [place] Number:  Herbalist and Address:  [facility] [place], [place] [address], [place], [place] [address]      Provider Number: [id number]  Attending Physician Name and Address:  Antonieta Pert, MD  Relative Name and Phone Number:       Current Level of Care: Hospital Recommended Level of Care: [place] Prior Approval Number:    Date Approved/Denied:   PASRR Number: [id number]  Discharge Plan: SNF    Current Diagnoses: Patient Active Problem List   Diagnosis Date Noted  . Fall at home, initial encounter 09/05/2020  . Essential hypertension 09/05/2020  . Atrial fibrillation, chronic ([place]) 09/05/2020  . Closed traumatic minimally displaced fracture of shaft of right clavicle 09/05/2020  . Pubic ramus fracture, right, closed, initial encounter ([place]) 09/05/2020  . 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 Tachy-brady syndrome ([place]) 06/03/2020  . [place] MRI Dual Chamber pacemaker 06/03/2018 06/03/2020  . Bilateral inguinal hernia 04/23/2013    Orientation RESPIRATION BLADDER Height & Weight     Self, Time, Situation, Place  Normal Continent Weight: 162 lb 0.6 oz (73.5 kg) Height:  5\' 6"  (167.6 cm)  BEHAVIORAL SYMPTOMS/MOOD NEUROLOGICAL BOWEL NUTRITION STATUS      Continent Diet (See discharge summary)  AMBULATORY STATUS COMMUNICATION OF NEEDS Skin   Extensive Assist Verbally Normal                       Personal Care Assistance Level of Assistance  Bathing, Feeding, Dressing Bathing Assistance: Maximum assistance Feeding assistance: Limited assistance Dressing Assistance: Maximum assistance     Functional Limitations Info  Sight, Hearing, Speech Sight Info: Adequate   Speech Info: Adequate     SPECIAL CARE FACTORS FREQUENCY  OT (By licensed OT), PT (By licensed PT)     PT Frequency: 5x a week OT Frequency: 5x a week            Contractures Contractures Info: Not present    Additional Factors Info  Code Status Code Status Info: DNR             Current Medications (09/07/2020):  This is the current hospital active medication list Current Facility-Administered Medications  Medication Dose Route Frequency Provider Last Rate Last Admin  . acetaminophen (TYLENOL) tablet 650 mg  650 mg Oral Q6H PRN Karmen Bongo, MD      . albuterol (PROVENTIL) (2.5 MG/3ML) 0.083% nebulizer solution 2.5 mg  2.5 mg Nebulization Q2H PRN Karmen Bongo, MD      . apixaban Arne [place]) tablet 2.5 mg  2.5 mg Oral BID Karmen Bongo, MD   2.5 mg at 09/07/20 0951  . atorvastatin (LIPITOR) tablet 10 mg  10 mg Oral QPM Karmen Bongo, MD   10 mg at 09/06/20 1735  . bisacodyl (DULCOLAX) EC tablet 5 mg  5 mg Oral Daily PRN Karmen Bongo, MD      . diltiazem (CARDIZEM) tablet 120 mg  120 mg Oral Daily Karmen Bongo, MD   120 mg at 09/07/20 0950  . docusate sodium (COLACE) capsule 100 mg  100 mg Oral BID Karmen Bongo, MD   100 mg at 09/06/20 2227  . feeding supplement (ENSURE ENLIVE / ENSURE PLUS) liquid 237 mL  237 mL Oral BID BM  Thurnell Lose, MD   237 mL at 09/07/20 [phone]  . hydrALAZINE (APRESOLINE) injection 5 mg  5 mg Intravenous Q4H PRN Karmen Bongo, MD      . hydrochlorothiazide (HYDRODIURIL) tablet 12.5 mg  12.5 mg Oral Daily Karmen Bongo, MD   12.5 mg at 09/07/20 0951  . HYDROcodone-acetaminophen (NORCO/VICODIN) 5-325 MG per tablet 1 tablet  1 tablet Oral Q6H PRN Thurnell Lose, MD   1 tablet at 09/07/20 1215  . melatonin tablet 10 mg  10 mg Oral QHS Kc, Ramesh, MD      . methocarbamol (ROBAXIN) 500 mg in dextrose 5 % 50 mL IVPB  500 mg Intravenous Q6H PRN Karmen Bongo, MD      . mirtazapine (REMERON) tablet 7.5 mg  7.5 mg Oral Ivery Quale, MD   7.5 mg at  09/06/20 2228  . morphine 2 MG/ML injection 2 mg  2 mg Intravenous Q2H PRN Karmen Bongo, MD   2 mg at 09/06/20 2482  . multivitamin with minerals tablet 1 tablet  1 tablet Oral Daily Thurnell Lose, MD   1 tablet at 09/07/20 0951  . ondansetron (ZOFRAN) injection 4 mg  4 mg Intravenous Q6H PRN Karmen Bongo, MD      . pantoprazole (PROTONIX) EC tablet 40 mg  40 mg Oral Daily Karmen Bongo, MD   40 mg at 09/07/20 0950  . polyethylene glycol (MIRALAX / GLYCOLAX) packet 17 g  17 g Oral Daily PRN Karmen Bongo, MD      . predniSONE (DELTASONE) tablet 2 mg  2 mg Oral Q breakfast Karmen Bongo, MD   2 mg at 09/07/20 0951  . tamsulosin (FLOMAX) capsule 0.4 mg  0.4 mg Oral Daily Karmen Bongo, MD   0.4 mg at 09/07/20 0951  . timolol (TIMOPTIC) 0.5 % ophthalmic solution 1 drop  1 drop Both Eyes Daily Kc, Ramesh, MD   1 drop at 09/07/20 1217     Discharge Medications: Please see discharge summary for a list of discharge medications.  Relevant Imaging Results:  Relevant Lab Results:   Additional Information SSN: [id number]  Emeterio Reeve, [place]

## 2020-09-07 NOTE — Progress Notes (Signed)
Physical Therapy Treatment Patient Details Name: Khalon Cansler MRN: [id number] DOB: [birth year] Today's Date: 09/07/2020    History of Present Illness E Verlon Pischke is a 86 y.o. male with medical history significant of pacemaker placement; HTN; and atrial flutter on Eliquis presenting with a fall.   Pt found to have R clavicle fx and pubic ramis fx.    PT Comments    Pt making gradual progress.  He was able to stand with heavy mod A but did require assist of 2 to return to supine for pain control.  Pt requiring min cues for safety and good participation with exercises.  Pt somewhat disappointed in his abilities and required encouragement and education on his progress.   Follow Up Recommendations  SNF     Equipment Recommendations  None recommended by PT (defer to post acute)    Recommendations for Other Services       Precautions / Restrictions Precautions Precautions: Fall Precaution Comments: pacemaker Required Braces or Orthoses: Sling Restrictions RUE Weight Bearing: Non weight bearing RLE Weight Bearing: Weight bearing as tolerated Other Position/Activity Restrictions: no shoulder ROM at this time    Mobility  Bed Mobility Overal bed mobility: Needs Assistance Bed Mobility: Sit to Supine       Sit to supine: Mod assist;+2 for physical assistance      Transfers Overall transfer level: Needs assistance   Transfers: Sit to/from Stand;Stand Pivot Transfers Sit to Stand: Mod assist Stand pivot transfers: Total assist       General transfer comment: Heavy mod A to stand from recliner and BSC - pt held stedy with L UE and pulled up with mod A.  Pt stood in [place] during pivot.  Used St[place] for chair to bsc then bsc to bed.  Total A for toielting ADLs.  Ambulation/Gait             General Gait Details: Pre gait activities including weight shifting in [place] for 30 sec but pt unable to lift feet   Stairs             Wheelchair Mobility     Modified Rankin (Stroke Patients Only)       Balance Overall balance assessment: Needs assistance;History of Falls Sitting-balance support: No upper extremity supported Sitting balance-Leahy Scale: Good Sitting balance - Comments: Sat EOB for 10 mins without UE support - performed exercises   Standing balance support: Single extremity supported;During functional activity Standing balance-Leahy Scale: Poor Standing balance comment: reliant on external support for balance - stood 2 bouts in [place] for 30 sec to 90 sec with L UE support                            Cognition Arousal/Alertness: Awake/alert Behavior During Therapy: WFL for tasks assessed/performed Overall Cognitive Status: Within Functional Limits for tasks assessed                                        Exercises General Exercises - Lower Extremity Ankle Circles/Pumps: AROM;Both;10 reps;Seated Long Arc Quad: AROM;Both;10 reps;Seated Hip Flexion/Marching: AAROM;Both;10 reps;Seated    General Comments        Pertinent Vitals/Pain Pain Assessment: Faces Faces Pain Scale: Hurts even more Pain Location: pelvis with transfers Pain Descriptors / Indicators: Grimacing;Guarding;Sore Pain Intervention(s): Limited activity within patient's tolerance;Monitored during session;Repositioned;Relaxation    Home Living  Prior Function            PT Goals (current goals can now be found in the care plan section) Acute Rehab PT Goals Patient Stated Goal: be able to stand, [facility]" PT Goal Formulation: With patient/family Time For Goal Achievement: 09/20/20 Potential to Achieve Goals: Fair Progress towards PT goals: Progressing toward goals    Frequency    Min 3X/week      PT Plan Current plan remains appropriate    Co-evaluation              AM-PAC PT "6 Clicks" Mobility   Outcome Measure  Help needed turning from your back to  your side while in a flat bed without using bedrails?: A Lot Help needed moving from lying on your back to sitting on the side of a flat bed without using bedrails?: Total Help needed moving to and from a bed to a chair (including a wheelchair)?: Total Help needed standing up from a chair using your arms (e.g., wheelchair or bedside chair)?: A Lot Help needed to walk in hospital room?: Total Help needed climbing 3-5 steps with a railing? : Total 6 Click Score: 8    End of Session Equipment Utilized During Treatment: Gait belt;Other (comment) (STEDY) Activity Tolerance: Patient tolerated treatment well Patient left: in bed;with call bell/phone within reach;with bed alarm set;with family/visitor present Nurse Communication: Mobility status PT Visit Diagnosis: Unsteadiness on feet (R26.81);Other abnormalities of gait and mobility (R26.89);Muscle weakness (generalized) (M62.81);History of falling (Z91.81);Pain Pain - Right/Left: Right Pain - part of body: Shoulder;Hip     Time: 1500-1530 PT Time Calculation (min) (ACUTE ONLY): 30 min  Charges:  $Therapeutic Exercise: 8-22 mins $Therapeutic Activity: 8-22 mins                     Abran Richard, PT Acute Rehab Services Pager [phone] [facility] [phone]     Karlton Lemon 09/07/2020, 4:09 PM

## 2020-09-08 LAB — BASIC METABOLIC PANEL
Anion gap: 8 (ref 5–15)
BUN: 29 mg/dL — ABNORMAL HIGH (ref 8–23)
CO2: 24 mmol/L (ref 22–32)
Calcium: 8.5 mg/dL — ABNORMAL LOW (ref 8.9–10.3)
Chloride: 101 mmol/L (ref 98–111)
Creatinine, Ser: 1.53 mg/dL — ABNORMAL HIGH (ref 0.61–1.24)
GFR, Estimated: 42 mL/min — ABNORMAL LOW (ref >60)
Glucose, Bld: 108 mg/dL — ABNORMAL HIGH (ref 70–99)
Potassium: 3.6 mmol/L (ref 3.5–5.1)
Sodium: 133 mmol/L — ABNORMAL LOW (ref 135–145)

## 2020-09-08 LAB — CBC
HCT: 29.5 % — ABNORMAL LOW (ref 39.0–52.0)
Hemoglobin: 9.5 g/dL — ABNORMAL LOW (ref 13.0–17.0)
MCH: 29.8 pg (ref 26.0–34.0)
MCHC: 32.2 g/dL (ref 30.0–36.0)
MCV: 92.5 fL (ref 80.0–100.0)
Platelets: 138 10*3/uL — ABNORMAL LOW (ref 150–400)
RBC: 3.19 MIL/uL — ABNORMAL LOW (ref 4.22–5.81)
RDW: 13.6 % (ref 11.5–15.5)
WBC: 10.9 10*3/uL — ABNORMAL HIGH (ref 4.0–10.5)
nRBC: 0 % (ref 0.0–0.2)

## 2020-09-08 MED ORDER — HYDROCODONE-ACETAMINOPHEN 5-325 MG PO TABS
1.0000 | ORAL_TABLET | ORAL | Status: DC | PRN
  Administered 2020-09-08 – 2020-09-09 (×5): 1 via ORAL
  Filled 2020-09-08 (×5): qty 1

## 2020-09-08 MED ORDER — LACTATED RINGERS IV SOLN: INTRAVENOUS | Status: AC

## 2020-09-08 NOTE — Discharge Instructions (Signed)

## 2020-09-08 NOTE — Progress Notes (Signed)
PROGRESS NOTE    Stephen Cuevas  PJK:[id number] DOB: 06-23-1926 DOA: 09/05/2020 PCP: [name], DO   Brief Narrative: 86 year old male with history of pacemaker placement, hypertension, a flutter on Eliquis presents with fall at home.  He got out of the bed and Toppled over,denied losing consciousness, reports hitting his head. Patient reports he had some pacemaker issues and was supposed to follow-up with [name] last week and patient was rescheduled for 10/26, He was seen in the ED and found to have right clavicle and right pubic ramus fracture. Patient was seen by surgery, sling normal bone void.  Recommend for right clavicle fracture and weightbearing as tolerated for pelvic fracture. Discussed with cardiology,was consulted and pacemaker interrogation planned for PM issues that was present PTA  Subjective: Patient complains of pain on the right shoulder. About to get his hydrocodone. No acute events overnight, creatinine trended 1.5.    Assessment & Plan:  Fall at home, initial encounter right clavicle and right PUBIC Ramus fracture: Plan is to continue PT OT pain management and skilled nursing facility differential   Right clavicle and right pubic ramus fracture appreciate orthopedic input, continue sling nonweightbearing with nonoperative management.  Continue WBAT and to follow-up with [name] for right superior/inferior Pubic ramus fracture.  Continue pain management -increase hydrocodone to every 4 hours as needed, continue muscle relaxants, Tylenol.  Avoid NSAIDs due to AKI.  Pacemaker in place and is being interrogated-patient reports he was told "pacemaker is fine"- I had  discussed with cardiology NP and was informed that after PM interrogation if there is issue patient will be seen by EP cardiology in-house.    Essential hypertension: BP controlled on Cardizem.  Hold HCTZ due to uptrending creatinine.  Atrial fibrillation, hx: CHA2DS2-VASc score greater than 3  continue Cardizem, Eliquis.  Followed by Dr. [name].  Anemia 10 to 12 g range.  Unclear etiology.  Monitor hemoglobin closely patient is on anticoagulation.  ?AKI on CKD stage IIIa:Baseline creatinine around 1.2-1.3 but seems to be up to 1.5 in July/2021.  Hold HCTZ, and nephrotoxic meds, add gentle IV hydration monitor BMP overnight to make sure it is stable Recent Labs  Lab 09/05/20 0907 09/05/20 0913 09/06/20 0145 09/08/20 0322  BUN 17 19 19  29*  CREATININE 1.24 1.20 1.19 1.53*   Mild hyponatremia continue IV fluids as above.  On chronic prednisone.  Nutrition: Diet Order            Diet Heart Room service appropriate? Yes with Assist; Fluid consistency: Thin  Diet effective now                 Nutrition Problem: Inadequate oral intake Etiology: decreased appetite Signs/Symptoms: meal completion < 25%, per patient/family report Interventions: Ensure Enlive (each supplement provides 350kcal and 20 grams of protein), MVI  Body mass index is 26.15 kg/m.   DVT prophylaxis: apixaban (ELIQUIS) tablet 2.5 mg Start: 09/05/20 2200 Code Status:   Code Status: DNR  Family Communication: plan of care discussed with patient and his daughter at bedside.  Status is: Inpatient Remains inpatient appropriate because:IV treatments appropriate due to intensity of illness or inability to take PO and Inpatient level of care appropriate due to severity of illness  Dispo: The patient is from: [facility]              Anticipated d/c is to: [facility]              Anticipated d/c date is:  1 day once okay with cardiology, and once  renal function is stable              Patient currently is not medically stable to d/c. Consultants:see note  Procedures:see note  Culture/Microbiology No results found for: SDES, SPECREQUEST, CULT, REPTSTATUS  Other culture-see note  Medications: Scheduled Meds:  apixaban  2.5 mg Oral BID   atorvastatin  10 mg Oral QPM   diltiazem  120 mg  Oral Daily   docusate sodium  100 mg Oral BID   feeding supplement  237 mL Oral BID BM   melatonin  10 mg Oral QHS   mirtazapine  7.5 mg Oral QHS   multivitamin with minerals  1 tablet Oral Daily   pantoprazole  40 mg Oral Daily   predniSONE  2 mg Oral Q breakfast   tamsulosin  0.4 mg Oral Daily   timolol  1 drop Both Eyes Daily   Continuous Infusions:  lactated ringers 75 mL/hr at 09/08/20 1021   methocarbamol (ROBAXIN) IV      Antimicrobials: Anti-infectives (From admission, onward)   None     Objective: Vitals: Today's Vitals   09/07/20 1956 09/07/20 2146 09/08/20 0500 09/08/20 0817  BP: 118/66  112/60 113/64  Pulse: 79  69 70  Resp: 17  18 17   Temp: 97.7 F (36.5 C)  97.9 F (36.6 C) 98.1 F (36.7 C)  TempSrc: Oral  Oral Oral  SpO2: 93%  95% 96%  Weight:      Height:      PainSc: 4  2       Intake/Output Summary (Last 24 hours) at 09/08/2020 1138 Last data filed at 09/08/2020 0900 Gross per 24 hour  Intake 360 ml  Output 400 ml  Net -40 ml   Filed Weights   09/05/20 0924  Weight: 73.5 kg   Weight change:   Intake/Output from previous day: 10/26 0701 - 10/27 0700 In: 360 [P.O.:360] Out: 400 [Urine:400] Intake/Output this shift: Total I/O In: 240 [P.O.:240] Out: -   Examination: General exam: AAOX3, NAD, weak appearing. HEENT:Oral mucosa moist, Ear/Nose WNL grossly, dentition normal. Respiratory system: bilaterally clear,no wheezing or crackles,no use of accessory muscle Cardiovascular system: S1 & S2 +, No JVD,. Gastrointestinal system: Abdomen soft, NT,ND, BS+ Nervous System:Alert, awake, moving extremities and grossly nonfocal Extremities: Right arm in sling, no edema, distal peripheral pulses palpable.  Skin: No rashes,no icterus. MSK: Normal muscle bulk,tone, power  Data Reviewed: I have personally reviewed following labs and imaging studies CBC: Recent Labs  Lab 09/05/20 0907 09/05/20 0913 09/06/20 0145 09/08/20 0322    WBC 11.3*  --  9.1 10.9*  NEUTROABS 8.2*  --   --   --   HGB 12.3* 12.2* 10.9* 9.5*  HCT 38.2* 36.0* 33.3* 29.5*  MCV 92.9  --  92.5 92.5  PLT 185  --  167 245*   Basic Metabolic Panel: Recent Labs  Lab 09/05/20 0907 09/05/20 0913 09/06/20 0145 09/08/20 0322  NA 139 141 137 133*  K 4.8 3.9 4.0 3.6  CL 105 103 104 101  CO2 23  --  23 24  GLUCOSE 109* 105* 95 108*  BUN 17 19 19  29*  CREATININE 1.24 1.20 1.19 1.53*  CALCIUM 8.7*  --  8.5* 8.5*   GFR: Estimated Creatinine Clearance: 27.8 mL/min (A) (by C-G formula based on SCr of 1.53 mg/dL (H)). Liver Function Tests: No results for input(s): AST, ALT, ALKPHOS, BILITOT, PROT, ALBUMIN in the last 168  hours. No results for input(s): LIPASE, AMYLASE in the last 168 hours. No results for input(s): AMMONIA in the last 168 hours. Coagulation Profile: No results for input(s): INR, PROTIME in the last 168 hours. Cardiac Enzymes: No results for input(s): CKTOTAL, CKMB, CKMBINDEX, TROPONINI in the last 168 hours. BNP (last 3 results) No results for input(s): PROBNP in the last 8760 hours. HbA1C: No results for input(s): HGBA1C in the last 72 hours. CBG: No results for input(s): GLUCAP in the last 168 hours. Lipid Profile: No results for input(s): CHOL, HDL, LDLCALC, TRIG, CHOLHDL, LDLDIRECT in the last 72 hours. Thyroid Function Tests: No results for input(s): TSH, T4TOTAL, FREET4, T3FREE, THYROIDAB in the last 72 hours. Anemia Panel: No results for input(s): VITAMINB12, FOLATE, FERRITIN, TIBC, IRON, RETICCTPCT in the last 72 hours. Sepsis Labs: No results for input(s): PROCALCITON, LATICACIDVEN in the last 168 hours.  Recent Results (from the past 240 hour(s))  Respiratory Panel by RT PCR (Flu A&B, Covid) - Nasopharyngeal Swab     Status: None   Collection Time: 09/05/20 11:00 AM   Specimen: Nasopharyngeal Swab  Result Value Ref Range Status   SARS Coronavirus 2 by RT PCR NEGATIVE NEGATIVE Final    Comment:  (NOTE) SARS-CoV-2 target nucleic acids are NOT DETECTED.  The SARS-CoV-2 RNA is generally detectable in upper respiratoy specimens during the acute phase of infection. The lowest concentration of SARS-CoV-2 viral copies this assay can detect is 131 copies/mL. A negative result does not preclude SARS-Cov-2 infection and should not be used as the sole basis for treatment or other patient management decisions. A negative result may occur with  improper specimen collection/handling, submission of specimen other than nasopharyngeal swab, presence of viral mutation(s) within the areas targeted by this assay, and inadequate number of viral copies (<131 copies/mL). A negative result must be combined with clinical observations, patient history, and epidemiological information. The expected result is Negative.  Fact Sheet for Patients:  PinkCheek.be  Fact Sheet for Healthcare Providers:  GravelBags.it  This test is no t yet approved or cleared by the [place] FDA and  has been authorized for detection and/or diagnosis of SARS-CoV-2 by FDA under an Emergency Use Authorization (EUA). This EUA will remain  in effect (meaning this test can be used) for the duration of the COVID-19 declaration under Section 564(b)(1) of the Act, 21 U.S.C. section 360bbb-3(b)(1), unless the authorization is terminated or revoked sooner.     Influenza A by PCR NEGATIVE NEGATIVE Final   Influenza B by PCR NEGATIVE NEGATIVE Final    Comment: (NOTE) The Xpert Xpress SARS-CoV-2/FLU/RSV assay is intended as an aid in  the diagnosis of influenza from Nasopharyngeal swab specimens and  should not be used as a sole basis for treatment. Nasal washings and  aspirates are unacceptable for Xpert Xpress SARS-CoV-2/FLU/RSV  testing.  Fact Sheet for Patients: PinkCheek.be  Fact Sheet for Healthcare  Providers: GravelBags.it  This test is not yet approved or cleared by the [place] FDA and  has been authorized for detection and/or diagnosis of SARS-CoV-2 by  FDA under an Emergency Use Authorization (EUA). This EUA will remain  in effect (meaning this test can be used) for the duration of the  Covid-19 declaration under Section 564(b)(1) of the Act, 21  U.S.C. section 360bbb-3(b)(1), unless the authorization is  terminated or revoked. Performed at [facility], [place] [address], [place],  [address]      Radiology Studies: No results found.   LOS: 3  days   Antonieta Pert, MD [facility]  09/08/2020, 11:38 AM

## 2020-09-08 NOTE — Care Management Important Message (Signed)
Important Message  Patient Details  Name: Antion Andres MRN: [id number] Date of Birth: [birth year]   Medicare Important Message Given:  Yes - Important Message mailed due to current National Emergency  Verbal consent obtained due to current National Emergency  Relationship to patient: Child Contact Name: Kayvon Mo Call Date: 09/08/20  Time: 3149 Phone: [phone] Outcome: Spoke with contact Important Message mailed to: Emergency contact on file    [place] 09/08/2020, 10:38 AM

## 2020-09-08 NOTE — Plan of Care (Signed)

## 2020-09-08 NOTE — Progress Notes (Signed)
Remote pacemaker transmission.   

## 2020-09-08 NOTE — Discharge Summary (Addendum)
Physician Discharge Summary  Stephen Cuevas WPV:[id number] DOB: December 12, 1925 DOA: 09/05/2020  PCP: [name], DO  Admit date: 09/05/2020 Discharge date: 09/09/2020  Admitted From: [facility]  Recommendations for Outpatient Follow-up:  1. Follow up with PCP in 1-2 weeks 2. Please obtain BMP/CBC in one week 3. Please follow up on the following pending results:  Home Health:NO  Equipment/Devices: NONE  Discharge Condition: Stable Code Status:   Code Status: DNR Diet recommendation:  Diet Order            Diet - low sodium heart healthy           Diet Heart Room service appropriate? Yes with Assist; Fluid consistency: Thin  Diet effective now                 Brief/Interim Summary: 86 year old male with history of pacemaker placement, hypertension, a flutter on Eliquis presents with fall at home.  He got out of the bed and Toppled over,denied losing consciousness, reports hitting his head. Patient reports he had some pacemaker issues and was supposed to follow-up with [name] last week and patient was rescheduled for 10/26, He was seen in the ED and found to have right clavicle and right pubic ramus fracture. Patient was seen by surgery, sling normal bone void.  Recommend for right clavicle fracture and weightbearing as tolerated for pelvic fracture. Discussed with cardiology,was consulted and pacemaker interrogation planned for PM issues that was present PTA. Patient was seen by orthopedic PT OT at this time pain is controlled on hydrocodone.  Is medically stable and is being [facility] facility.  Discharge Diagnoses:   Fall at home, initial encounter right clavicle and right PUBIC Ramus fracture: Plan is to continue PT OT pain management and skilled nursing facility differential   Right clavicle and right pubic ramus fracture appreciate orthopedic input, continue sling nonweightbearing  with nonoperative management.  Continue WBAT and to follow-up with [name] for right superior/inferior Pubic ramus fracture.  Continue pain management  : Cont on hydrocodone to every 4 hours as needed, cont prn Tylenol.  Avoid NSAIDs due to AKI.  Pacemaker in place and is being interrogated-patient reports he was told "pacemaker is fine"- I had  discussed with cardiology NP and was informed that after PM interrogation if there is issue patient will be seen by EP cardiology in-house.no issues.  Essential hypertension: BP controlled on Cardizem. resume HCTZ  Upon discharge.    Atrial fibrillation, hx: CHA2DS2-VASc score greater than 3 continue Cardizem, Eliquis.  Followed by Dr. [name].  Anemia 10 to 12 g range.  Unclear etiology.  Monitor hemoglobin closely patient is on anticoagulation.  AKI on CKD stage IIIa:Baseline creatinine around 1.2-1.3 but seems to be up to 1.5 in July/2021.  Held HCTZ, given ivf-creatinine improved to 1.2.  Monitor intermittently at the facility.   Mild hyponatremia stable. On chronic prednisone. Patient reports he had Covid vaccine modern on January 1 in February 2 and he is requesting  booster dose.  inpatient Covid vaccine team alerted and will be given booster and will be monitored as per protocol prior to discharge today  Consults:  CARDIO- PM INTERROGATION  ORTHO  Subjective: Alert awake, resting comfortably.  Some pain but controlled on hydrocodone.  Discharge Exam: Vitals:   09/08/20 2015 09/09/20 0410  BP: 131/77 127/68  Pulse: 73 70  Resp: 16 17  Temp: 98.5 F (36.9 C) 98.4 F (36.9  C)  SpO2: 95% 98%   General: Pt is alert, awake, not in acute distress Cardiovascular: RRR, S1/S2 +, no rubs, no gallops Respiratory: CTA bilaterally, no wheezing, no rhonchi Abdominal: Soft, NT, ND, bowel sounds + Extremities: no edema, no cyanosis, right arm with sling in place. Discharge Instructions  Discharge Instructions    Diet - low sodium heart  healthy   Complete by: As directed    Discharge instructions   Complete by: As directed    Please call call MD or return to ER for similar or worsening recurring problem that brought you to hospital or if any fever,nausea/vomiting,abdominal pain, uncontrolled pain, chest pain,  shortness of breath or any other alarming symptoms.  Please follow-up with the orthopedic doctor as instructed.  Continue with the pain management.   Please follow-up your doctor as instructed in a week time and call the office for appointment.  Please avoid alcohol, smoking, or any other illicit substance and maintain healthy habits including taking your regular medications as prescribed.  You were cared for by a hospitalist during your hospital stay. If you have any questions about your discharge medications or the care you received while you were in the hospital after you are discharged, you can call the unit and ask to speak with the hospitalist on call if the hospitalist that took care of you is not available.  Once you are discharged, your primary care physician will handle any further medical issues. Please note that NO REFILLS for any discharge medications will be authorized once you are discharged, as it is imperative that you return to your primary care physician (or establish a relationship with a primary care physician if you do not have one) for your aftercare needs so that they can reassess your need for medications and monitor your lab values   Increase activity slowly   Complete by: As directed    Increase activity slowly   Complete by: As directed      Allergies as of 09/09/2020   No Known Allergies     Medication List    TAKE these medications   apixaban 2.5 MG Tabs tablet Commonly known as: ELIQUIS Take 1 tablet (2.5 mg total) by mouth 2 (two) times daily.   atorvastatin 10 MG tablet Commonly known as: LIPITOR Take 10 mg by mouth every evening.   diltiazem 120 MG tablet Commonly known  as: CARDIZEM Take 120 mg by mouth daily.   docusate sodium 100 MG capsule Commonly known as: Colace Take 1 capsule (100 mg total) by mouth daily as needed.   hydrochlorothiazide 12.5 MG tablet Commonly known as: HYDRODIURIL Take 12.5 mg by mouth daily.   HYDROcodone-acetaminophen 5-325 MG tablet Commonly known as: NORCO/VICODIN Take 1 tablet by mouth every 4 (four) hours as needed for up to 6 doses for severe pain.   ICAPS AREDS 2 PO Take 2 tablets by mouth 2 (two) times daily.   Melatonin 10 MG Caps Take 10 mg by mouth at bedtime.   mirtazapine 7.5 MG tablet Commonly known as: REMERON Take 7.5 mg by mouth at bedtime.   omeprazole 20 MG capsule Commonly known as: PRILOSEC Take 20 mg by mouth daily with breakfast.   predniSONE 2 MG Tbec Take 2 mg by mouth daily.   tamsulosin 0.4 MG Caps capsule Commonly known as: FLOMAX Take 0.4 mg by mouth daily.   timolol 0.5 % ophthalmic solution Commonly known as: TIMOPTIC Place 1 drop into both eyes daily.  Follow-up Information    [name], DO. Schedule an appointment as soon as possible for a visit in 1 week(s).   Specialty: Internal Medicine Contact information: [place] [address] [phone]        Paralee Cancel, MD. Schedule an appointment as soon as possible for a visit in 1 week(s).   Specialty: Orthopedic Surgery Why: Clavicle fracture, pubic ramus fracture. Contact information: [place] [address] [phone]              No Known Allergies  The results of significant diagnostics from this hospitalization (including imaging, microbiology, ancillary and laboratory) are listed below for reference.    Microbiology: Recent Results (from the past 240 hour(s))  Respiratory Panel by RT PCR (Flu A&B, Covid) - Nasopharyngeal Swab     Status: None   Collection Time: 09/05/20 11:00 AM   Specimen: Nasopharyngeal Swab  Result Value Ref Range Status    SARS Coronavirus 2 by RT PCR NEGATIVE NEGATIVE Final    Comment: (NOTE) SARS-CoV-2 target nucleic acids are NOT DETECTED.  The SARS-CoV-2 RNA is generally detectable in upper respiratoy specimens during the acute phase of infection. The lowest concentration of SARS-CoV-2 viral copies this assay can detect is 131 copies/mL. A negative result does not preclude SARS-Cov-2 infection and should not be used as the sole basis for treatment or other patient management decisions. A negative result may occur with  improper specimen collection/handling, submission of specimen other than nasopharyngeal swab, presence of viral mutation(s) within the areas targeted by this assay, and inadequate number of viral copies (<131 copies/mL). A negative result must be combined with clinical observations, patient history, and epidemiological information. The expected result is Negative.  Fact Sheet for Patients:  PinkCheek.be  Fact Sheet for Healthcare Providers:  GravelBags.it  This test is no t yet approved or cleared by the [place] FDA and  has been authorized for detection and/or diagnosis of SARS-CoV-2 by FDA under an Emergency Use Authorization (EUA). This EUA will remain  in effect (meaning this test can be used) for the duration of the COVID-19 declaration under Section 564(b)(1) of the Act, 21 U.S.C. section 360bbb-3(b)(1), unless the authorization is terminated or revoked sooner.     Influenza A by PCR NEGATIVE NEGATIVE Final   Influenza B by PCR NEGATIVE NEGATIVE Final    Comment: (NOTE) The Xpert Xpress SARS-CoV-2/FLU/RSV assay is intended as an aid in  the diagnosis of influenza from Nasopharyngeal swab specimens and  should not be used as a sole basis for treatment. Nasal washings and  aspirates are unacceptable for Xpert Xpress SARS-CoV-2/FLU/RSV  testing.  Fact Sheet for  Patients: PinkCheek.be  Fact Sheet for Healthcare Providers: GravelBags.it  This test is not yet approved or cleared by the [place] FDA and  has been authorized for detection and/or diagnosis of SARS-CoV-2 by  FDA under an Emergency Use Authorization (EUA). This EUA will remain  in effect (meaning this test can be used) for the duration of the  Covid-19 declaration under Section 564(b)(1) of the Act, 21  U.S.C. section 360bbb-3(b)(1), unless the authorization is  terminated or revoked. Performed at [facility], [place] [address] [place], [place] [address]     Procedures/Studies: DG Chest 1 View  Result Date: 09/05/2020 CLINICAL DATA:  Fall with RIGHT chest pain. EXAM: CHEST  1 VIEW COMPARISON:  06/04/2020 prior studies FINDINGS: Cardiomegaly and LEFT-sided pacemaker again noted. There is no evidence of focal airspace  disease, pulmonary edema, suspicious pulmonary nodule/mass, pleural effusion, or pneumothorax. No acute bony abnormalities are identified. IMPRESSION: Cardiomegaly without evidence of acute cardiopulmonary disease. Electronically Signed   By: Margarette Canada M.D.   On: 09/05/2020 10:16   DG Clavicle Right  Result Date: 09/05/2020 CLINICAL DATA:  Acute RIGHT clavicle pain following fall today. Initial encounter. EXAM: RIGHT CLAVICLE - 2+ VIEWS COMPARISON:  Prior chest and shoulder radiographs FINDINGS: A vertical fracture of the mid RIGHT clavicle is noted with 3 mm distraction. No other acute bony abnormalities are noted. IMPRESSION: Minimally distracted mid RIGHT clavicle fracture. Electronically Signed   By: Margarette Canada M.D.   On: 09/05/2020 10:15   DG Shoulder Right  Result Date: 09/05/2020 CLINICAL DATA:  Fall with right shoulder pain EXAM: RIGHT SHOULDER - 2+ VIEW COMPARISON:  None. FINDINGS: Right mid clavicle fracture with branching component extending lateral to the level of the coracoclavicular  interval. There is pending clavicle radiograph. No glenohumeral fracture or dislocation. Located acromioclavicular joint. Prominent osteopenia IMPRESSION: Mid right clavicle fracture extending to the level of the coracoclavicular interval. Electronically Signed   By: Monte Fantasia M.D.   On: 09/05/2020 10:20   CT Head Wo Contrast  Result Date: 09/05/2020 CLINICAL DATA:  Status post fall. EXAM: CT HEAD WITHOUT CONTRAST CT CERVICAL SPINE WITHOUT CONTRAST TECHNIQUE: Multidetector CT imaging of the head and cervical spine was performed following the standard protocol without intravenous contrast. Multiplanar CT image reconstructions of the cervical spine were also generated. COMPARISON:  None. FINDINGS: CT HEAD FINDINGS Brain: Mild diffuse cortical atrophy is noted. Mild chronic ischemic white matter disease is noted. No mass effect or midline shift is noted. Ventricular size is within normal limits. There is no evidence of mass lesion, hemorrhage or acute infarction. Vascular: No hyperdense vessel or unexpected calcification. Skull: Normal. Negative for fracture or focal lesion. Sinuses/Orbits: No acute finding. Other: None. CT CERVICAL SPINE FINDINGS Alignment: Normal. Skull base and vertebrae: No acute fracture. No primary bone lesion or focal pathologic process. Soft tissues and spinal canal: No prevertebral fluid or swelling. No visible canal hematoma. Disc levels: Moderate degenerative disc disease is noted at C5-6 and C6-7. Upper chest: Negative. Other: Degenerative changes are seen involving posterior facet joints bilaterally. IMPRESSION: 1. Mild diffuse cortical atrophy. Mild chronic ischemic white matter disease. No acute intracranial abnormality seen. 2. Multilevel degenerative disc disease. No acute abnormality seen in the cervical spine. Electronically Signed   By: Marijo Conception M.D.   On: 09/05/2020 10:50   CT Cervical Spine Wo Contrast  Result Date: 09/05/2020 CLINICAL DATA:  Status post  fall. EXAM: CT HEAD WITHOUT CONTRAST CT CERVICAL SPINE WITHOUT CONTRAST TECHNIQUE: Multidetector CT imaging of the head and cervical spine was performed following the standard protocol without intravenous contrast. Multiplanar CT image reconstructions of the cervical spine were also generated. COMPARISON:  None. FINDINGS: CT HEAD FINDINGS Brain: Mild diffuse cortical atrophy is noted. Mild chronic ischemic white matter disease is noted. No mass effect or midline shift is noted. Ventricular size is within normal limits. There is no evidence of mass lesion, hemorrhage or acute infarction. Vascular: No hyperdense vessel or unexpected calcification. Skull: Normal. Negative for fracture or focal lesion. Sinuses/Orbits: No acute finding. Other: None. CT CERVICAL SPINE FINDINGS Alignment: Normal. Skull base and vertebrae: No acute fracture. No primary bone lesion or focal pathologic process. Soft tissues and spinal canal: No prevertebral fluid or swelling. No visible canal hematoma. Disc levels: Moderate degenerative disc disease is noted at  C5-6 and C6-7. Upper chest: Negative. Other: Degenerative changes are seen involving posterior facet joints bilaterally. IMPRESSION: 1. Mild diffuse cortical atrophy. Mild chronic ischemic white matter disease. No acute intracranial abnormality seen. 2. Multilevel degenerative disc disease. No acute abnormality seen in the cervical spine. Electronically Signed   By: Marijo Conception M.D.   On: 09/05/2020 10:50   CT PELVIS WO CONTRAST  Result Date: 09/05/2020 CLINICAL DATA:  Right hip pain after fall. EXAM: CT PELVIS WITHOUT CONTRAST TECHNIQUE: Multidetector CT imaging of the pelvis was performed following the standard protocol without intravenous contrast. COMPARISON:  September 16, 2012. FINDINGS: Urinary Tract:  No abnormality visualized. Bowel:  Unremarkable visualized pelvic bowel loops. Vascular/Lymphatic: No pathologically enlarged lymph nodes. No significant vascular  abnormality seen. Reproductive:  No mass or other significant abnormality Other: Stable large left inguinal hernia is noted which contains a loop of colon, but does not result in obstruction. Stable large fat containing right inguinal hernia is noted as well. No ascites is noted. Musculoskeletal: Probable acute nondisplaced fracture is seen involving the right superior pubic ramus which extends posteriorly toward the medial acetabular area, although does not appear to involve the articular surface of the right hip. IMPRESSION: 1. Probable acute nondisplaced fracture is seen involving the right superior pubic ramus which extends posteriorly toward the medial acetabular area, although does not appear to involve the articular surface of the right hip. 2. Stable large left inguinal hernia is noted which contains a loop of colon, but does not result in obstruction. 3. Stable large fat containing right inguinal hernia is noted as well. Electronically Signed   By: Marijo Conception M.D.   On: 09/05/2020 11:57   DG Knee Complete 4 Views Right  Result Date: 09/05/2020 CLINICAL DATA:  Acute RIGHT knee pain following fall today. Initial encounter. EXAM: RIGHT KNEE - COMPLETE 4+ VIEW COMPARISON:  None. FINDINGS: No acute fracture, dislocation or joint effusion. Mild joint space narrowing osteophytosis in all 3 compartments noted. No focal bony lesions are present. Vascular calcifications are identified. IMPRESSION: 1. No acute abnormality. 2. Mild tricompartmental degenerative changes. Electronically Signed   By: Margarette Canada M.D.   On: 09/05/2020 10:24   CUP PACEART REMOTE DEVICE CHECK  Result Date: 09/02/2020 Scheduled remote reviewed. Normal device function.  Known PAF, on [place].  AF burden is 9% of the time.  Ventricular auto capture increased to 5 volts.  Sent to triage. Next remote 91 days. Kathy Breach, RN, CCDS, [employer]  DG Hip Unilat W or Wo Pelvis 2-3 Views Right  Result Date:  09/05/2020 CLINICAL DATA:  Acute RIGHT hip pain following fall today. Initial encounter. EXAM: DG HIP (WITH OR WITHOUT PELVIS) 2-3V RIGHT COMPARISON:  06/13/2014 FINDINGS: There is mild cortical irregularity along the RIGHT SUPERIOR and INFERIOR pubic rami which has a different appearance since 06/13/2014 and may represent nondisplaced fractures. No other fracture, subluxation or dislocation identified. Degenerative changes in both hips are present. No suspicious focal bony lesions are present. IMPRESSION: Mild cortical irregularity of the RIGHT SUPERIOR and INFERIOR pubic rami which may represent nondisplaced fractures. Consider CT for further evaluation as clinically indicated. Electronically Signed   By: Margarette Canada M.D.   On: 09/05/2020 10:35    Labs: BNP (last 3 results) No results for input(s): BNP in the last 8760 hours. Basic Metabolic Panel: Recent Labs  Lab 09/05/20 0907 09/05/20 0913 09/06/20 0145 09/08/20 0322 09/09/20 0351  NA 139 141 137 133* 135  K  4.8 3.9 4.0 3.6 3.6  CL 105 103 104 101 101  CO2 23  --  23 24 25   GLUCOSE 109* 105* 95 108* 96  BUN 17 19 19  29* 23  CREATININE 1.24 1.20 1.19 1.53* 1.25*  CALCIUM 8.7*  --  8.5* 8.5* 8.7*   Liver Function Tests: No results for input(s): AST, ALT, ALKPHOS, BILITOT, PROT, ALBUMIN in the last 168 hours. No results for input(s): LIPASE, AMYLASE in the last 168 hours. No results for input(s): AMMONIA in the last 168 hours. CBC: Recent Labs  Lab 09/05/20 0907 09/05/20 0913 09/06/20 0145 09/08/20 0322 09/09/20 0351  WBC 11.3*  --  9.1 10.9* 10.1  NEUTROABS 8.2*  --   --   --   --   HGB 12.3* 12.2* 10.9* 9.5* 10.0*  HCT 38.2* 36.0* 33.3* 29.5* 30.3*  MCV 92.9  --  92.5 92.5 92.7  PLT 185  --  167 138* 180   Cardiac Enzymes: No results for input(s): CKTOTAL, CKMB, CKMBINDEX, TROPONINI in the last 168 hours. BNP: Invalid input(s): POCBNP CBG: No results for input(s): GLUCAP in the last 168 hours. D-Dimer No results  for input(s): DDIMER in the last 72 hours. Hgb A1c No results for input(s): HGBA1C in the last 72 hours. Lipid Profile No results for input(s): CHOL, HDL, LDLCALC, TRIG, CHOLHDL, LDLDIRECT in the last 72 hours. Thyroid function studies No results for input(s): TSH, T4TOTAL, T3FREE, THYROIDAB in the last 72 hours.  Invalid input(s): FREET3 Anemia work up No results for input(s): VITAMINB12, FOLATE, FERRITIN, TIBC, IRON, RETICCTPCT in the last 72 hours. Urinalysis    Component Value Date/Time   COLORURINE YELLOW 09/05/2020 1414   APPEARANCEUR CLEAR 09/05/2020 1414   LABSPEC 1.016 09/05/2020 1414   PHURINE 6.0 09/05/2020 1414   GLUCOSEU NEGATIVE 09/05/2020 1414   HGBUR NEGATIVE 09/05/2020 1414   BILIRUBINUR NEGATIVE 09/05/2020 1414   KETONESUR 5 (A) 09/05/2020 1414   PROTEINUR NEGATIVE 09/05/2020 1414   UROBILINOGEN 0.2 09/16/2012 0237   NITRITE NEGATIVE 09/05/2020 1414   LEUKOCYTESUR NEGATIVE 09/05/2020 1414   Sepsis Labs Invalid input(s): PROCALCITONIN,  WBC,  LACTICIDVEN Microbiology Recent Results (from the past 240 hour(s))  Respiratory Panel by RT PCR (Flu A&B, Covid) - Nasopharyngeal Swab     Status: None   Collection Time: 09/05/20 11:00 AM   Specimen: Nasopharyngeal Swab  Result Value Ref Range Status   SARS Coronavirus 2 by RT PCR NEGATIVE NEGATIVE Final    Comment: (NOTE) SARS-CoV-2 target nucleic acids are NOT DETECTED.  The SARS-CoV-2 RNA is generally detectable in upper respiratoy specimens during the acute phase of infection. The lowest concentration of SARS-CoV-2 viral copies this assay can detect is 131 copies/mL. A negative result does not preclude SARS-Cov-2 infection and should not be used as the sole basis for treatment or other patient management decisions. A negative result may occur with  improper specimen collection/handling, submission of specimen other than nasopharyngeal swab, presence of viral mutation(s) within the areas targeted by this  assay, and inadequate number of viral copies (<131 copies/mL). A negative result must be combined with clinical observations, patient history, and epidemiological information. The expected result is Negative.  Fact Sheet for Patients:  PinkCheek.be  Fact Sheet for Healthcare Providers:  GravelBags.it  This test is no t yet approved or cleared by the [place] FDA and  has been authorized for detection and/or diagnosis of SARS-CoV-2 by FDA under an Emergency Use Authorization (EUA). This EUA will remain  in effect (meaning  this test can be used) for the duration of the COVID-19 declaration under Section 564(b)(1) of the Act, 21 U.S.C. section 360bbb-3(b)(1), unless the authorization is terminated or revoked sooner.     Influenza A by PCR NEGATIVE NEGATIVE Final   Influenza B by PCR NEGATIVE NEGATIVE Final    Comment: (NOTE) The Xpert Xpress SARS-CoV-2/FLU/RSV assay is intended as an aid in  the diagnosis of influenza from Nasopharyngeal swab specimens and  should not be used as a sole basis for treatment. Nasal washings and  aspirates are unacceptable for Xpert Xpress SARS-CoV-2/FLU/RSV  testing.  Fact Sheet for Patients: PinkCheek.be  Fact Sheet for Healthcare Providers: GravelBags.it  This test is not yet approved or cleared by the [place] FDA and  has been authorized for detection and/or diagnosis of SARS-CoV-2 by  FDA under an Emergency Use Authorization (EUA). This EUA will remain  in effect (meaning this test can be used) for the duration of the  Covid-19 declaration under Section 564(b)(1) of the Act, 21  U.S.C. section 360bbb-3(b)(1), unless the authorization is  terminated or revoked. Performed at [facility], [place] [address], [place], [place] [address]      Time coordinating discharge: 25  minutes  SIGNED: Antonieta Pert, MD  [facility] 09/09/2020, 11:43 AM  If 7PM-7AM, please contact night-coverage [email or web address]

## 2020-09-09 ENCOUNTER — Inpatient Hospital Stay: Payer: Medicare Other

## 2020-09-09 DIAGNOSIS — Z23 Encounter for immunization: Secondary | ICD-10-CM

## 2020-09-09 LAB — BASIC METABOLIC PANEL
Anion gap: 9 (ref 5–15)
BUN: 23 mg/dL (ref 8–23)
CO2: 25 mmol/L (ref 22–32)
Calcium: 8.7 mg/dL — ABNORMAL LOW (ref 8.9–10.3)
Chloride: 101 mmol/L (ref 98–111)
Creatinine, Ser: 1.25 mg/dL — ABNORMAL HIGH (ref 0.61–1.24)
GFR, Estimated: 54 mL/min — ABNORMAL LOW (ref >60)
Glucose, Bld: 96 mg/dL (ref 70–99)
Potassium: 3.6 mmol/L (ref 3.5–5.1)
Sodium: 135 mmol/L (ref 135–145)

## 2020-09-09 LAB — RESPIRATORY PANEL BY RT PCR (FLU A&B, COVID)
Influenza A by PCR: NEGATIVE
Influenza B by PCR: NEGATIVE
SARS Coronavirus 2 by RT PCR: NEGATIVE

## 2020-09-09 LAB — CBC
HCT: 30.3 % — ABNORMAL LOW (ref 39.0–52.0)
Hemoglobin: 10 g/dL — ABNORMAL LOW (ref 13.0–17.0)
MCH: 30.6 pg (ref 26.0–34.0)
MCHC: 33 g/dL (ref 30.0–36.0)
MCV: 92.7 fL (ref 80.0–100.0)
Platelets: 180 10*3/uL (ref 150–400)
RBC: 3.27 MIL/uL — ABNORMAL LOW (ref 4.22–5.81)
RDW: 13.5 % (ref 11.5–15.5)
WBC: 10.1 10*3/uL (ref 4.0–10.5)
nRBC: 0 % (ref 0.0–0.2)

## 2020-09-09 MED ORDER — DOCUSATE SODIUM 100 MG PO CAPS: 100.0000 mg | ORAL_CAPSULE | Freq: Every day | ORAL | 2 refills | Status: DC | PRN

## 2020-09-09 MED ORDER — HYDROCODONE-ACETAMINOPHEN 5-325 MG PO TABS: 1.0000 | ORAL_TABLET | ORAL | 0 refills | Status: DC | PRN

## 2020-09-09 NOTE — Progress Notes (Signed)
Physical Therapy Treatment Patient Details Name: Stephen Cuevas MRN: [id number] DOB: 11/08/1926 Today's Date: 09/09/2020    History of Present Illness [name] is a 86 y.o. male with medical history significant of pacemaker placement; HTN; and atrial flutter on Eliquis presenting with a fall.   Pt found to have R clavicle fx and pubic ramis fx.    PT Comments    Pt reports having a very rough night but agreeable to work with physical therapy to get out of bed to chair. Pt utilized stedy for transfers. Min cueing to maintain NWB on R UE. Pt seems discouraged and hard on himself. Continued to encourage pt and feel he will progress well once pain is manageable. Will continue to follow acutely.    Follow Up Recommendations  SNF     Equipment Recommendations  None recommended by PT (defer to post acute)    Recommendations for Other Services       Precautions / Restrictions Precautions Precautions: Fall Precaution Comments: pacemaker Required Braces or Orthoses: Sling Restrictions Weight Bearing Restrictions: Yes RUE Weight Bearing: Non weight bearing RLE Weight Bearing: Weight bearing as tolerated Other Position/Activity Restrictions: no shoulder ROM at this time    Mobility  Bed Mobility Overal bed mobility: Needs Assistance Bed Mobility: Sit to Supine     Supine to sit: Mod assist;+2 for physical assistance;+2 for safety/equipment;HOB elevated     General bed mobility comments: Mod A for LE management and to elevate trunk.   Transfers Overall transfer level: Needs assistance   Transfers: Sit to/from Stand;Stand Pivot Transfers Sit to Stand: Mod assist Stand pivot transfers: Total assist       General transfer comment: Pt was able to stand is stedy pulling up with the LUE. Slow to initiate stand but once started pt required mod A to power up. Pt transfered via stedy to [facility] and recliner chair. Pt with good eccentric control lowering into chair. Cues for hand  placement  Ambulation/Gait             General Gait Details: Pre gait activities in stedy including weight shifting. Pt able to lift RLE, however unable to lift LLE secondary to pain.    Stairs             Wheelchair Mobility    Modified Rankin (Stroke Patients Only)       Balance Overall balance assessment: Needs assistance;History of Falls Sitting-balance support: No upper extremity supported Sitting balance-Leahy Scale: Good     Standing balance support: Single extremity supported;During functional activity Standing balance-Leahy Scale: Poor Standing balance comment: reliant on external support for balance - stood 2 bouts in [place] for 30 sec to 90 sec with L UE support                            Cognition Arousal/Alertness: Awake/alert Behavior During Therapy: WFL for tasks assessed/performed Overall Cognitive Status: Within Functional Limits for tasks assessed                                        Exercises General Exercises - Lower Extremity Ankle Circles/Pumps: AROM;Both;10 reps;Seated Long Arc Quad: AROM;Both;10 reps;Seated Hip Flexion/Marching: AAROM;Both;10 reps;Seated    General Comments General comments (skin integrity, edema, etc.): daughter present for session.       Pertinent Vitals/Pain Pain Assessment: Faces Faces Pain Scale:  Hurts even more Pain Location: pelvis with transfers Pain Descriptors / Indicators: Grimacing;Guarding;Sore    Home Living                      Prior Function            PT Goals (current goals can now be found in the care plan section) Acute Rehab PT Goals Patient Stated Goal: be able to stand, rehab at [facility]" PT Goal Formulation: With patient/family Time For Goal Achievement: 09/20/20 Potential to Achieve Goals: Fair Progress towards PT goals: Progressing toward goals    Frequency    Min 3X/week      PT Plan Current plan remains appropriate     Co-evaluation              AM-PAC PT "6 Clicks" Mobility   Outcome Measure  Help needed turning from your back to your side while in a flat bed without using bedrails?: A Lot Help needed moving from lying on your back to sitting on the side of a flat bed without using bedrails?: A Lot Help needed moving to and from a bed to a chair (including a wheelchair)?: Total Help needed standing up from a chair using your arms (e.g., wheelchair or bedside chair)?: A Lot Help needed to walk in hospital room?: Total Help needed climbing 3-5 steps with a railing? : Total 6 Click Score: 9    End of Session Equipment Utilized During Treatment: Gait belt;Other (comment) (STEDY) Activity Tolerance: Patient tolerated treatment well Patient left: with call bell/phone within reach;with family/visitor present;in chair;with chair alarm set Nurse Communication: Mobility status PT Visit Diagnosis: Unsteadiness on feet (R26.81);Other abnormalities of gait and mobility (R26.89);Muscle weakness (generalized) (M62.81);History of falling (Z91.81);Pain Pain - Right/Left: Right Pain - part of body: Shoulder;Hip     Time: 3888-2800 PT Time Calculation (min) (ACUTE ONLY): 28 min  Charges:  $Therapeutic Activity: 23-37 mins                     Benjiman Core, [place] Pager [phone] Acute Rehab   Allena Katz 09/09/2020, 11:12 AM

## 2020-09-09 NOTE — TOC Transition Note (Addendum)
Transition of Care [facility]) - CM/SW Discharge Note   Patient Details  Name: Stephen Cuevas MRN: [id number] Date of Birth: 09-Jun-1926  [facility] CM/SW Contact:  Emeterio Reeve, [place] Phone Number: 09/09/2020, 1:21 PM   Clinical Narrative:     [facility]. Pt is negative for covid. Pts daughter at bedside and was notified of discharge.  Nurse to call report to [phone] ext. [phone].  Final next level of care: Skilled Nursing Facility Barriers to Discharge: Barriers Resolved   Patient Goals and CMS Choice Patient states their goals for this hospitalization and ongoing recovery are:: Get back to ILF CMS Medicare.gov Compare Post Acute Care list provided to:: Patient Choice offered to / list presented to : Patient  Discharge Placement              Patient [facility] bed at: [facility] Patient to be transferred to facility by: [place] Name of family member notified: Katy Apo, daughter at bedside Patient and family notified of of transfer: 09/09/20  Discharge Plan and Services                                     Social Determinants of Health (SDOH) Interventions     Readmission Risk Interventions No flowsheet data found.  Emeterio Reeve, Latanya Presser, [place] Social Worker [phone]

## 2020-09-09 NOTE — Progress Notes (Signed)
   Covid-19 Vaccination Clinic  Name:  Stephen Cuevas    MRN: [id number] DOB: [birth year]  09/09/2020  [name] was observed post Covid-19 immunization for 15 minutes without incident. He was provided with Vaccine Information Sheet and instruction to access the V-Safe system.   [name] was instructed to call [phone] with any severe reactions post vaccine: Marland Kitchen Difficulty breathing  . Swelling of face and throat  . A fast heartbeat  . A bad rash all over body  . Dizziness and weakness

## 2020-09-09 NOTE — Plan of Care (Signed)

## 2020-09-09 NOTE — Progress Notes (Signed)
IV removed, pt AVS in chart for PTAR, report given to [facility], all questions answered and call back number provided. Family aware of d/c plans.

## 2020-09-10 ENCOUNTER — Other Ambulatory Visit: Payer: Self-pay | Admitting: Nurse Practitioner

## 2020-09-10 ENCOUNTER — Encounter: Payer: Self-pay | Admitting: Nurse Practitioner

## 2020-09-10 ENCOUNTER — Other Ambulatory Visit: Payer: Self-pay | Admitting: Cardiology

## 2020-09-10 ENCOUNTER — Non-Acute Institutional Stay (SKILLED_NURSING_FACILITY): Payer: Medicare Other | Admitting: Nurse Practitioner

## 2020-09-10 DIAGNOSIS — F5105 Insomnia due to other mental disorder: Secondary | ICD-10-CM

## 2020-09-10 DIAGNOSIS — Z950 Presence of cardiac pacemaker: Secondary | ICD-10-CM

## 2020-09-10 DIAGNOSIS — S42021D Displaced fracture of shaft of right clavicle, subsequent encounter for fracture with routine healing: Secondary | ICD-10-CM | POA: Diagnosis not present

## 2020-09-10 DIAGNOSIS — E871 Hypo-osmolality and hyponatremia: Secondary | ICD-10-CM

## 2020-09-10 DIAGNOSIS — N1831 Chronic kidney disease, stage 3a: Secondary | ICD-10-CM

## 2020-09-10 DIAGNOSIS — F418 Other specified anxiety disorders: Secondary | ICD-10-CM

## 2020-09-10 DIAGNOSIS — I482 Chronic atrial fibrillation, unspecified: Secondary | ICD-10-CM | POA: Diagnosis not present

## 2020-09-10 DIAGNOSIS — N183 Chronic kidney disease, stage 3 unspecified: Secondary | ICD-10-CM | POA: Insufficient documentation

## 2020-09-10 DIAGNOSIS — K5901 Slow transit constipation: Secondary | ICD-10-CM

## 2020-09-10 DIAGNOSIS — S32591A Other specified fracture of right pubis, initial encounter for closed fracture: Secondary | ICD-10-CM

## 2020-09-10 DIAGNOSIS — K219 Gastro-esophageal reflux disease without esophagitis: Secondary | ICD-10-CM

## 2020-09-10 DIAGNOSIS — I10 Essential (primary) hypertension: Secondary | ICD-10-CM

## 2020-09-10 DIAGNOSIS — D649 Anemia, unspecified: Secondary | ICD-10-CM

## 2020-09-10 DIAGNOSIS — M5432 Sciatica, left side: Secondary | ICD-10-CM

## 2020-09-10 DIAGNOSIS — R350 Frequency of micturition: Secondary | ICD-10-CM | POA: Insufficient documentation

## 2020-09-10 MED ORDER — HYDROCODONE-ACETAMINOPHEN 5-325 MG PO TABS: 1.0000 | ORAL_TABLET | ORAL | 0 refills | Status: AC | PRN

## 2020-09-10 NOTE — Assessment & Plan Note (Signed)
Anemia, mild, Hgb 10 09/09/20

## 2020-09-10 NOTE — Assessment & Plan Note (Signed)
GERD, takes Omeprazole  

## 2020-09-10 NOTE — Assessment & Plan Note (Signed)
Pacemaker, f/u [name], interrogation for PM issues.

## 2020-09-10 NOTE — Assessment & Plan Note (Signed)
Depression/insmonia, takes Mirtazapine 7.5mg  qhs

## 2020-09-10 NOTE — Assessment & Plan Note (Signed)
Prednisone 2mg  qd for chronic left sciatica pain.

## 2020-09-10 NOTE — Assessment & Plan Note (Signed)
CKD baseline creat 1.25 09/09/20

## 2020-09-10 NOTE — Assessment & Plan Note (Signed)
Hospitalized 09/05/20-09/09/20 for a mechanical fall at home when got out bed, toppled over, resulted the right pelvis ramus fx/WBAT. SNF [facility] for therapy.

## 2020-09-10 NOTE — Assessment & Plan Note (Signed)
Continue Colace, adding MiraLax daily.

## 2020-09-10 NOTE — Assessment & Plan Note (Signed)
Urinary frequency, takes Tamsulosin 0.4mg  qd

## 2020-09-10 NOTE — Assessment & Plan Note (Signed)
AFib, Eliquis, CHA2DS2-VAS score >3, f/u Dr. [name]

## 2020-09-10 NOTE — Assessment & Plan Note (Signed)
HTN, takes Diltiazem, HCTZ

## 2020-09-10 NOTE — Progress Notes (Signed)
Location:   SNF [place] Room Number: 5 Place of Service:  SNF (31) Provider: [facility] Leanord Thibeau NP  [name], DO  Patient Care Team: [name], DO as PCP - General (Internal Medicine)  Extended Emergency Contact Information Primary Emergency Contact: Kinnick,Robert L Address: [address]          [place], [place] [address] Johnnette Litter of [place] Phone: [phone] Work Phone: [phone] Mobile Phone: [phone] Relation: Son  Code Status:  DNR Goals of care: Advanced Directive information Advanced Directives 09/05/2020  Does Patient Have a Medical Advance Directive? Yes  Type of Advance Directive [place]  Does patient want to make changes to medical advance directive? Yes (ED - Information included in AVS)  Copy of [place] in Chart? Yes - validated most recent copy scanned in chart (See row information)  Would patient like information on creating a medical advance directive? -  Pre-existing out of facility DNR order (yellow form or pink MOST form) -     Chief Complaint  Patient presents with  . Acute Visit    Medication review    HPI:  Pt is a 86 y.o. male seen today for an acute visit for medication review  Hospitalized 09/05/20-09/09/20 for a mechanical fall at home when got out bed, toppled over, resulted R clavicle/placed on sling and right pelvis ramus fx/WBAT.  AKI was corrected with IVF, f/u  CBC, BMP one week, prn Norco for pain.   Pacemaker, f/u [name], interrogation for PM issues.   AFib, Eliquis, CHA2DS2-VAS score >3, f/u Dr. [name]  HTN, takes Diltiazem, HCTZ  Anemia, mild, Hgb 10 09/09/20  CKD baseline creat 1.25 09/09/20  Hyponatremia, Na 135 09/09/20  Urinary frequency, takes Tamsulosin 0.4mg  qd  Prednisone 2mg  qd for chronic left sciatica pain.   GERD, takes Omeprazole   Depression/insmonia, takes Mirtazapine 7.5mg  qhs  Hyperlipidemia, takes Atorvastatin 10mg  qd  Constipation,  takes Colace 100mg  prn.      Past Medical History:  Diagnosis Date  . Ankle edema   . Anxiety   . Dysrhythmia    a-fib/flutter- on eliquis  . GERD (gastroesophageal reflux disease)    no treatment  . Hard of hearing   . 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 Hypertension   . Kidney stone   . Presence of permanent cardiac pacemaker 06/03/2020  . Spinal stenosis   . Tachy-brady syndrome ([place]) 06/03/2020   Past Surgical History:  Procedure Laterality Date  . addnoid    . CYSTOSCOPY W/ URETEROSCOPY  1980  . INSERT / REPLACE / REMOVE PACEMAKER  06/03/2020  . KNEE SURGERY    . NAILBED REPAIR Left 06/27/2016   Procedure: NAILBED biopsy left thumb;  Surgeon: Daryll Brod, MD;  Location: [facility];  Service: Orthopedics;  Laterality: Left;  FAB  . PACEMAKER IMPLANT N/A 06/03/2020   Procedure: PACEMAKER IMPLANT;  Surgeon: Constance Haw, MD;  Location: [facility];  Service: Cardiovascular;  Laterality: N/A;  . TONSILLECTOMY      No Known Allergies  Allergies as of 09/10/2020   No Known Allergies     Medication List       Accurate as of September 10, 2020 11:59 PM. If you have any questions, ask your nurse or doctor.        apixaban 2.5 MG Tabs tablet Commonly known as: ELIQUIS Take 1 tablet (2.5 mg total) by mouth 2 (two) times daily.   atorvastatin  10 MG tablet Commonly known as: LIPITOR Take 10 mg by mouth every evening.   diltiazem 120 MG tablet Commonly known as: CARDIZEM Take 120 mg by mouth daily.   docusate sodium 100 MG capsule Commonly known as: Colace Take 1 capsule (100 mg total) by mouth daily as needed.   hydrochlorothiazide 12.5 MG tablet Commonly known as: HYDRODIURIL Take 12.5 mg by mouth daily.   HYDROcodone-acetaminophen 5-325 MG tablet Commonly known as: NORCO/VICODIN Take 1 tablet by mouth every 4 (four) hours as needed for up to 6 doses for severe pain. What changed: Another medication with the same name was  added. Make sure you understand how and when to take each. Changed by: Patsie Mccardle X Aubryana Vittorio, NP   HYDROcodone-acetaminophen 5-325 MG tablet Commonly known as: Norco Take 1 tablet by mouth every 4 (four) hours as needed for up to 5 days for moderate pain. What changed: You were already taking a medication with the same name, and this prescription was added. Make sure you understand how and when to take each. Changed by: Caty Tessler X Kaleen Rochette, NP   ICAPS AREDS 2 PO Take 2 tablets by mouth 2 (two) times daily.   Melatonin 10 MG Caps Take 10 mg by mouth at bedtime.   mirtazapine 7.5 MG tablet Commonly known as: REMERON Take 7.5 mg by mouth at bedtime.   omeprazole 20 MG capsule Commonly known as: PRILOSEC Take 20 mg by mouth daily with breakfast.   predniSONE 2 MG Tbec Take 2 mg by mouth daily.   tamsulosin 0.4 MG Caps capsule Commonly known as: FLOMAX Take 0.4 mg by mouth daily.   timolol 0.5 % ophthalmic solution Commonly known as: TIMOPTIC Place 1 drop into both eyes daily.   Tubersol 5 UNIT/0.1ML injection Generic drug: tuberculin Inject into the skin once. Once A Day Every 14 Days   zinc oxide 20 % ointment Apply 1 application topically as needed for irritation.       Review of Systems  Constitutional: Negative for activity change, appetite change, chills, diaphoresis and fever.  HENT: Positive for hearing loss. Negative for congestion, trouble swallowing and voice change.        Hearing aids.   Eyes: Negative for visual disturbance.  Respiratory: Negative for cough, chest tightness, shortness of breath and wheezing.   Gastrointestinal: Positive for abdominal distention and constipation. Negative for abdominal pain, blood in stool, nausea and vomiting.       R+L inguinal hernia.   Genitourinary: Positive for frequency. Negative for difficulty urinating and dysuria.       2-3 x/night, urinary leakage uses adult brief or condom cath  Musculoskeletal: Positive for arthralgias, back  pain and gait problem.       Left sciatica pain   Skin: Positive for color change.       Bruise left shoulder, arm, chest  Neurological: Positive for numbness. Negative for dizziness, facial asymmetry, speech difficulty, weakness and headaches.       Feels numb in fingers, but able to make fist  Psychiatric/Behavioral: Negative for behavioral problems, confusion, hallucinations and sleep disturbance. The patient is not nervous/anxious.     Immunization History  Administered Date(s) Administered  . Moderna SARS-COV2 Booster Vaccination 09/09/2020   Pertinent  Health Maintenance Due  Topic Date Due  . PNA vac Low Risk Adult (1 of 2 - PCV13) Never done  . INFLUENZA VACCINE  06/13/2020   No flowsheet data found. Functional Status Survey:    Vitals:   09/10/20 1053  BP: 100/62  Pulse: 72  Resp: (!) 21  Temp: (!) 97.3 F (36.3 C)  SpO2: 98%  Weight: 162 lb (73.5 kg)  Height: 5\' 6"  (1.676 m)   Body mass index is 26.15 kg/m. Physical Exam Vitals and nursing note reviewed.  Constitutional:      General: He is not in acute distress.    Appearance: Normal appearance. He is not ill-appearing, toxic-appearing or diaphoretic.  HENT:     Nose: Nose normal.     Mouth/Throat:     Mouth: Mucous membranes are moist.  Eyes:     Extraocular Movements: Extraocular movements intact.     Conjunctiva/sclera: Conjunctivae normal.     Pupils: Pupils are equal, round, and reactive to light.  Cardiovascular:     Rate and Rhythm: Normal rate and regular rhythm.     Comments: Pacemaker.  Pulmonary:     Effort: Pulmonary effort is normal.     Breath sounds: No wheezing, rhonchi or rales.  Abdominal:     General: Bowel sounds are normal.     Palpations: Abdomen is soft.     Tenderness: There is no abdominal tenderness. There is no right CVA tenderness, left CVA tenderness, guarding or rebound.     Hernia: A hernia is present.     Comments: R+L hermina  Genitourinary:    Penis: Normal.     Musculoskeletal:     Cervical back: Normal range of motion and neck supple.  Neurological:     General: No focal deficit present.     Mental Status: He is alert and oriented to person, place, and time. Mental status is at baseline.     Motor: No weakness.     Coordination: Coordination normal.     Gait: Gait abnormal.  Psychiatric:        Mood and Affect: Mood normal.        Behavior: Behavior normal.        Thought Content: Thought content normal.        Judgment: Judgment normal.     Labs reviewed: Recent Labs    09/06/20 0145 09/08/20 0322 09/09/20 0351  NA 137 133* 135  K 4.0 3.6 3.6  CL 104 101 101  CO2 23 24 25   GLUCOSE 95 108* 96  BUN 19 29* 23  CREATININE 1.19 1.53* 1.25*  CALCIUM 8.5* 8.5* 8.7*   No results for input(s): AST, ALT, ALKPHOS, BILITOT, PROT, ALBUMIN in the last 8760 hours. Recent Labs    09/05/20 0907 09/05/20 0913 09/06/20 0145 09/08/20 0322 09/09/20 0351  WBC 11.3*   < > 9.1 10.9* 10.1  NEUTROABS 8.2*  --   --   --   --   HGB 12.3*   < > 10.9* 9.5* 10.0*  HCT 38.2*   < > 33.3* 29.5* 30.3*  MCV 92.9   < > 92.5 92.5 92.7  PLT 185   < > 167 138* 180   < > = values in this interval not displayed.   No results found for: TSH No results found for: HGBA1C No results found for: CHOL, HDL, LDLCALC, LDLDIRECT, TRIG, CHOLHDL  Significant Diagnostic Results in last 30 days:  DG Chest 1 View  Result Date: 09/05/2020 CLINICAL DATA:  Fall with RIGHT chest pain. EXAM: CHEST  1 VIEW COMPARISON:  06/04/2020 prior studies FINDINGS: Cardiomegaly and LEFT-sided pacemaker again noted. There is no evidence of focal airspace disease, pulmonary edema, suspicious pulmonary nodule/mass, pleural effusion, or pneumothorax. No acute bony abnormalities  are identified. IMPRESSION: Cardiomegaly without evidence of acute cardiopulmonary disease. Electronically Signed   By: Margarette Canada M.D.   On: 09/05/2020 10:16   DG Clavicle Right  Result Date: 09/05/2020 CLINICAL  DATA:  Acute RIGHT clavicle pain following fall today. Initial encounter. EXAM: RIGHT CLAVICLE - 2+ VIEWS COMPARISON:  Prior chest and shoulder radiographs FINDINGS: A vertical fracture of the mid RIGHT clavicle is noted with 3 mm distraction. No other acute bony abnormalities are noted. IMPRESSION: Minimally distracted mid RIGHT clavicle fracture. Electronically Signed   By: Margarette Canada M.D.   On: 09/05/2020 10:15   DG Shoulder Right  Result Date: 09/05/2020 CLINICAL DATA:  Fall with right shoulder pain EXAM: RIGHT SHOULDER - 2+ VIEW COMPARISON:  None. FINDINGS: Right mid clavicle fracture with branching component extending lateral to the level of the coracoclavicular interval. There is pending clavicle radiograph. No glenohumeral fracture or dislocation. Located acromioclavicular joint. Prominent osteopenia IMPRESSION: Mid right clavicle fracture extending to the level of the coracoclavicular interval. Electronically Signed   By: Monte Fantasia M.D.   On: 09/05/2020 10:20   CT Head Wo Contrast  Result Date: 09/05/2020 CLINICAL DATA:  Status post fall. EXAM: CT HEAD WITHOUT CONTRAST CT CERVICAL SPINE WITHOUT CONTRAST TECHNIQUE: Multidetector CT imaging of the head and cervical spine was performed following the standard protocol without intravenous contrast. Multiplanar CT image reconstructions of the cervical spine were also generated. COMPARISON:  None. FINDINGS: CT HEAD FINDINGS Brain: Mild diffuse cortical atrophy is noted. Mild chronic ischemic white matter disease is noted. No mass effect or midline shift is noted. Ventricular size is within normal limits. There is no evidence of mass lesion, hemorrhage or acute infarction. Vascular: No hyperdense vessel or unexpected calcification. Skull: Normal. Negative for fracture or focal lesion. Sinuses/Orbits: No acute finding. Other: None. CT CERVICAL SPINE FINDINGS Alignment: Normal. Skull base and vertebrae: No acute fracture. No primary bone lesion or  focal pathologic process. Soft tissues and spinal canal: No prevertebral fluid or swelling. No visible canal hematoma. Disc levels: Moderate degenerative disc disease is noted at C5-6 and C6-7. Upper chest: Negative. Other: Degenerative changes are seen involving posterior facet joints bilaterally. IMPRESSION: 1. Mild diffuse cortical atrophy. Mild chronic ischemic white matter disease. No acute intracranial abnormality seen. 2. Multilevel degenerative disc disease. No acute abnormality seen in the cervical spine. Electronically Signed   By: Marijo Conception M.D.   On: 09/05/2020 10:50   CT Cervical Spine Wo Contrast  Result Date: 09/05/2020 CLINICAL DATA:  Status post fall. EXAM: CT HEAD WITHOUT CONTRAST CT CERVICAL SPINE WITHOUT CONTRAST TECHNIQUE: Multidetector CT imaging of the head and cervical spine was performed following the standard protocol without intravenous contrast. Multiplanar CT image reconstructions of the cervical spine were also generated. COMPARISON:  None. FINDINGS: CT HEAD FINDINGS Brain: Mild diffuse cortical atrophy is noted. Mild chronic ischemic white matter disease is noted. No mass effect or midline shift is noted. Ventricular size is within normal limits. There is no evidence of mass lesion, hemorrhage or acute infarction. Vascular: No hyperdense vessel or unexpected calcification. Skull: Normal. Negative for fracture or focal lesion. Sinuses/Orbits: No acute finding. Other: None. CT CERVICAL SPINE FINDINGS Alignment: Normal. Skull base and vertebrae: No acute fracture. No primary bone lesion or focal pathologic process. Soft tissues and spinal canal: No prevertebral fluid or swelling. No visible canal hematoma. Disc levels: Moderate degenerative disc disease is noted at C5-6 and C6-7. Upper chest: Negative. Other: Degenerative changes are seen involving posterior facet  joints bilaterally. IMPRESSION: 1. Mild diffuse cortical atrophy. Mild chronic ischemic white matter disease. No  acute intracranial abnormality seen. 2. Multilevel degenerative disc disease. No acute abnormality seen in the cervical spine. Electronically Signed   By: Marijo Conception M.D.   On: 09/05/2020 10:50   CT PELVIS WO CONTRAST  Result Date: 09/05/2020 CLINICAL DATA:  Right hip pain after fall. EXAM: CT PELVIS WITHOUT CONTRAST TECHNIQUE: Multidetector CT imaging of the pelvis was performed following the standard protocol without intravenous contrast. COMPARISON:  September 16, 2012. FINDINGS: Urinary Tract:  No abnormality visualized. Bowel:  Unremarkable visualized pelvic bowel loops. Vascular/Lymphatic: No pathologically enlarged lymph nodes. No significant vascular abnormality seen. Reproductive:  No mass or other significant abnormality Other: Stable large left inguinal hernia is noted which contains a loop of colon, but does not result in obstruction. Stable large fat containing right inguinal hernia is noted as well. No ascites is noted. Musculoskeletal: Probable acute nondisplaced fracture is seen involving the right superior pubic ramus which extends posteriorly toward the medial acetabular area, although does not appear to involve the articular surface of the right hip. IMPRESSION: 1. Probable acute nondisplaced fracture is seen involving the right superior pubic ramus which extends posteriorly toward the medial acetabular area, although does not appear to involve the articular surface of the right hip. 2. Stable large left inguinal hernia is noted which contains a loop of colon, but does not result in obstruction. 3. Stable large fat containing right inguinal hernia is noted as well. Electronically Signed   By: Marijo Conception M.D.   On: 09/05/2020 11:57   DG Knee Complete 4 Views Right  Result Date: 09/05/2020 CLINICAL DATA:  Acute RIGHT knee pain following fall today. Initial encounter. EXAM: RIGHT KNEE - COMPLETE 4+ VIEW COMPARISON:  None. FINDINGS: No acute fracture, dislocation or joint effusion.  Mild joint space narrowing osteophytosis in all 3 compartments noted. No focal bony lesions are present. Vascular calcifications are identified. IMPRESSION: 1. No acute abnormality. 2. Mild tricompartmental degenerative changes. Electronically Signed   By: Margarette Canada M.D.   On: 09/05/2020 10:24   CUP PACEART REMOTE DEVICE CHECK  Result Date: 09/02/2020 Scheduled remote reviewed. Normal device function.  Known PAF, on [place].  AF burden is 9% of the time.  Ventricular auto capture increased to 5 volts.  Sent to triage. Next remote 91 days. Kathy Breach, RN, CCDS, [employer]  DG Hip Unilat W or Wo Pelvis 2-3 Views Right  Result Date: 09/05/2020 CLINICAL DATA:  Acute RIGHT hip pain following fall today. Initial encounter. EXAM: DG HIP (WITH OR WITHOUT PELVIS) 2-3V RIGHT COMPARISON:  06/13/2014 FINDINGS: There is mild cortical irregularity along the RIGHT SUPERIOR and INFERIOR pubic rami which has a different appearance since 06/13/2014 and may represent nondisplaced fractures. No other fracture, subluxation or dislocation identified. Degenerative changes in both hips are present. No suspicious focal bony lesions are present. IMPRESSION: Mild cortical irregularity of the RIGHT SUPERIOR and INFERIOR pubic rami which may represent nondisplaced fractures. Consider CT for further evaluation as clinically indicated. Electronically Signed   By: Margarette Canada M.D.   On: 09/05/2020 10:35    Assessment/Plan Closed traumatic minimally displaced fracture of shaft of right clavicle Hospitalized 09/05/20-09/09/20 for a mechanical fall at home when got out bed, toppled over, resulted R clavicle/placed on sling, f/u Ortho [name]   Pubic ramus fracture, right, closed, initial encounter [facility]) Hospitalized 09/05/20-09/09/20 for a mechanical fall at home when got out  bed, toppled over, resulted the right pelvis ramus fx/WBAT. SNF [facility] for therapy.    [place] MRI Dual Chamber  pacemaker 06/03/2018 Pacemaker, f/u [name], interrogation for PM issues.    Atrial fibrillation, chronic (HCC) AFib, Eliquis, CHA2DS2-VAS score >3, f/u Dr. [name]   Essential hypertension HTN, takes Diltiazem, HCTZ   Anemia Anemia, mild, Hgb 10 09/09/20   CKD (chronic kidney disease) stage 3, GFR 30-59 ml/min (HCC) CKD baseline creat 1.25 09/09/20  Hyponatremia Hyponatremia, Na 135 09/09/20   Urinary frequency Urinary frequency, takes Tamsulosin 0.4mg  qd   GERD (gastroesophageal reflux disease) GERD, takes Omeprazole   Insomnia secondary to depression with anxiety Depression/insmonia, takes Mirtazapine 7.5mg  qhs  Slow transit constipation Continue Colace, adding MiraLax daily.   Sciatica, left side Prednisone 2mg  qd for chronic left sciatica pain.    Family/ staff Communication: plan of care reviewed with the patient and charge nurse.   Labs/tests ordered: none  Time spend 35 minutes.

## 2020-09-10 NOTE — Assessment & Plan Note (Signed)
Hospitalized 09/05/20-09/09/20 for a mechanical fall at home when got out bed, toppled over, resulted R clavicle/placed on sling, f/u Ortho [name]

## 2020-09-10 NOTE — Assessment & Plan Note (Signed)
Hyponatremia, Na 135 09/09/20

## 2020-09-13 ENCOUNTER — Encounter: Payer: Self-pay | Admitting: Nurse Practitioner

## 2020-09-13 LAB — CBC AND DIFFERENTIAL
HCT: 32 — AB (ref 41–53)
Hemoglobin: 10.6 — AB (ref 13.5–17.5)
Platelets: 286 (ref 150–399)
WBC: 9.5

## 2020-09-13 LAB — COMPREHENSIVE METABOLIC PANEL: Calcium: 8.8 (ref 8.7–10.7)

## 2020-09-13 LAB — BASIC METABOLIC PANEL
BUN: 26 — AB (ref 4–21)
CO2: 28 — AB (ref 13–22)
Chloride: 101 (ref 99–108)
Creatinine: 1.1 (ref 0.6–1.3)
Glucose: 90
Potassium: 3.8 (ref 3.4–5.3)
Sodium: 137 (ref 137–147)

## 2020-09-13 LAB — CBC: RBC: 3.5 — AB (ref 3.87–5.11)

## 2020-09-16 ENCOUNTER — Encounter: Payer: Self-pay | Admitting: Internal Medicine

## 2020-09-16 ENCOUNTER — Non-Acute Institutional Stay (SKILLED_NURSING_FACILITY): Payer: Medicare Other | Admitting: Internal Medicine

## 2020-09-16 DIAGNOSIS — S42021D Displaced fracture of shaft of right clavicle, subsequent encounter for fracture with routine healing: Secondary | ICD-10-CM | POA: Diagnosis not present

## 2020-09-16 DIAGNOSIS — S32591S Other specified fracture of right pubis, sequela: Secondary | ICD-10-CM

## 2020-09-16 DIAGNOSIS — I10 Essential (primary) hypertension: Secondary | ICD-10-CM

## 2020-09-16 DIAGNOSIS — M5432 Sciatica, left side: Secondary | ICD-10-CM

## 2020-09-16 DIAGNOSIS — F418 Other specified anxiety disorders: Secondary | ICD-10-CM

## 2020-09-16 DIAGNOSIS — N1831 Chronic kidney disease, stage 3a: Secondary | ICD-10-CM

## 2020-09-16 DIAGNOSIS — K219 Gastro-esophageal reflux disease without esophagitis: Secondary | ICD-10-CM

## 2020-09-16 DIAGNOSIS — I482 Chronic atrial fibrillation, unspecified: Secondary | ICD-10-CM

## 2020-09-16 DIAGNOSIS — D649 Anemia, unspecified: Secondary | ICD-10-CM

## 2020-09-16 DIAGNOSIS — Z950 Presence of cardiac pacemaker: Secondary | ICD-10-CM

## 2020-09-16 DIAGNOSIS — F5105 Insomnia due to other mental disorder: Secondary | ICD-10-CM

## 2020-09-16 DIAGNOSIS — K5901 Slow transit constipation: Secondary | ICD-10-CM

## 2020-09-16 DIAGNOSIS — E871 Hypo-osmolality and hyponatremia: Secondary | ICD-10-CM

## 2020-09-16 NOTE — Progress Notes (Signed)
Provider:  Veleta Miners MD Location:  [facility] Room Number: 32 Place of Service:  SNF ([id number])  PCP: Virgie Dad, MD Patient Care Team: Virgie Dad, MD as PCP - General (Internal Medicine)  Extended Emergency Contact Information Primary Emergency Contact: Michelle Nasuti Address: [address]          [place],  [address] Johnnette Litter of [place] Phone: [phone] Work Phone: [phone] Mobile Phone: [phone] Relation: Son  Code Status: DNR Goals of Care: Advanced Directive information Advanced Directives 09/05/2020  Does Patient Have a Medical Advance Directive? Yes  Type of Advance Directive [place]  Does patient want to make changes to medical advance directive? Yes (ED - Information included in AVS)  Copy of [place] in Chart? Yes - validated most recent copy scanned in chart (See row information)  Would patient like information on creating a medical advance directive? -  Pre-existing out of facility DNR order (yellow form or pink MOST form) -      Chief Complaint  Patient presents with  . New Admit To SNF    Admission to SNF    HPI: Patient is a 86 y.o. male seen today for admission to SNF for therapy  Patient was admitted in the hospital from 10/24-10/28 after mechanical fall in his apartment With the right clavicle and right pubic ramus fracture.  Patient has a history of A. fib on Eliquis, hypertension, hyperlipidemia, s/p pacemaker placed  Patient states that he had a mechanical fall in his apartment.  Was able to finally call for help.  In the ED he was found to have right clavicle and right pubic ramus fracture.  Was treated conservatively. His main complaint today was pain.  He said he did not sleep well at night due to pain.  He does have a as needed order hydrocodone it seems like he has not been asking for it. His appetite is poor also complaining of constipation.   Denies any dizziness.  No chest pain palpitations or shortness of breath. Patient lives by herself in apartment has son in town.  Does not drive anymore.  Was using walker before. Past Medical History:  Diagnosis Date  . Ankle edema   . Anxiety   . Dysrhythmia    a-fib/flutter- on eliquis  . GERD (gastroesophageal reflux disease)    no treatment  . Hard of hearing   . 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 Hypertension   . Kidney stone   . Presence of permanent cardiac pacemaker 06/03/2020  . Spinal stenosis   . Tachy-brady syndrome ([place]) 06/03/2020   Past Surgical History:  Procedure Laterality Date  . addnoid    . CYSTOSCOPY W/ URETEROSCOPY  1980  . INSERT / REPLACE / REMOVE PACEMAKER  06/03/2020  . KNEE SURGERY    . NAILBED REPAIR Left 06/27/2016   Procedure: NAILBED biopsy left thumb;  Surgeon: Daryll Brod, MD;  Location: [facility];  Service: Orthopedics;  Laterality: Left;  FAB  . PACEMAKER IMPLANT N/A 06/03/2020   Procedure: PACEMAKER IMPLANT;  Surgeon: Constance Haw, MD;  Location: [facility];  Service: Cardiovascular;  Laterality: N/A;  . TONSILLECTOMY      reports that he quit smoking about 64 years ago. His smoking use included cigarettes, pipe, and cigars. He has a 30.00 pack-year smoking history. He has never used smokeless tobacco. He reports current alcohol use. He reports that he does  not use drugs. Social History   Socioeconomic History  . Marital status: Widowed    Spouse name: Not on file  . Number of children: 4  . Years of education: Not on file  . Highest education level: Not on file  Occupational History  . Occupation: retired  Tobacco Use  . Smoking status: Former Smoker    Packs/day: 1.50    Years: 20.00    Pack years: 30.00    Types: Cigarettes, Pipe, Cigars    Quit date: 03/13/1956    Years since quitting: 64.5  . Smokeless tobacco: Never Used  Vaping Use  . Vaping Use: Never used  Substance and Sexual Activity   . Alcohol use: Yes    Comment: daily  . Drug use: No  . Sexual activity: Not on file  Other Topics Concern  . Not on file  Social History Narrative  . Not on file   Social Determinants of Health   Financial Resource Strain:   . Difficulty of Paying Living Expenses: Not on file  Food Insecurity:   . Worried About Charity fundraiser in the Last Year: Not on file  . Ran Out of Food in the Last Year: Not on file  Transportation Needs:   . Lack of Transportation (Medical): Not on file  . Lack of Transportation (Non-Medical): Not on file  Physical Activity:   . Days of Exercise per Week: Not on file  . Minutes of Exercise per Session: Not on file  Stress:   . Feeling of Stress : Not on file  Social Connections:   . Frequency of Communication with Friends and Family: Not on file  . Frequency of Social Gatherings with Friends and Family: Not on file  . Attends Religious Services: Not on file  . Active Member of Clubs or Organizations: Not on file  . Attends Archivist Meetings: Not on file  . Marital Status: Not on file  Intimate Partner Violence:   . Fear of Current or Ex-Partner: Not on file  . Emotionally Abused: Not on file  . Physically Abused: Not on file  . Sexually Abused: Not on file    Functional Status Survey:    Family History  Problem Relation Age of Onset  . Heart failure Father   . Heart disease Father     Health Maintenance  Topic Date Due  . TETANUS/TDAP  Never done  . PNA vac Low Risk Adult (1 of 2 - PCV13) Never done  . INFLUENZA VACCINE  06/13/2020  . COVID-19 Vaccine (2 - Moderna 2-dose series) 10/07/2020    No Known Allergies  Outpatient Encounter Medications as of 09/16/2020  Medication Sig  . apixaban (ELIQUIS) 2.5 MG TABS tablet Take 1 tablet (2.5 mg total) by mouth 2 (two) times daily.  Marland Kitchen atorvastatin (LIPITOR) 10 MG tablet Take 10 mg by mouth every evening.   . diltiazem (CARDIZEM) 120 MG tablet Take 120 mg by mouth daily.    Marland Kitchen docusate sodium (COLACE) 100 MG capsule Take 1 capsule (100 mg total) by mouth daily as needed.  . hydrochlorothiazide (HYDRODIURIL) 12.5 MG tablet Take 12.5 mg by mouth daily.  Marland Kitchen HYDROcodone-acetaminophen (NORCO/VICODIN) 5-325 MG tablet Take 1 tablet by mouth every 4 (four) hours as needed for up to 6 doses for severe pain.  Marland Kitchen lactose free nutrition (BOOST) LIQD Take 237 mLs by mouth 2 (two) times daily between meals.  . Melatonin 10 MG CAPS Take 10 mg by mouth at bedtime.  Marland Kitchen  mirtazapine (REMERON) 7.5 MG tablet Take 7.5 mg by mouth at bedtime.   . Multiple Vitamins-Minerals (ICAPS AREDS 2 PO) Take 2 tablets by mouth 2 (two) times daily.  Marland Kitchen omeprazole (PRILOSEC) 20 MG capsule Take 20 mg by mouth daily with breakfast.   . polyethylene glycol (MIRALAX / GLYCOLAX) 17 g packet Take 17 g by mouth daily.  . predniSONE 2 MG TBEC Take 2 mg by mouth daily.  . tamsulosin (FLOMAX) 0.4 MG CAPS capsule Take 0.4 mg by mouth daily.  . timolol (TIMOPTIC) 0.5 % ophthalmic solution Place 1 drop into both eyes daily.  Marland Kitchen tuberculin (TUBERSOL) 5 UNIT/0.1ML injection Inject into the skin once. Once A Day Every 14 Days  . zinc oxide 20 % ointment Apply 1 application topically as needed for irritation.   No facility-administered encounter medications on file as of 09/16/2020.    Review of Systems  Constitutional: Positive for activity change and appetite change.  HENT: Negative.   Respiratory: Negative.   Cardiovascular: Negative.   Gastrointestinal: Positive for constipation.  Genitourinary: Negative.   Musculoskeletal: Positive for arthralgias and myalgias.  Skin: Negative.   Neurological: Positive for weakness.  Psychiatric/Behavioral: Positive for sleep disturbance.    Vitals:   09/16/20 1515  BP: 116/82  Pulse: 86  Resp: 16  Temp: 97.7 F (36.5 C)  SpO2: 97%  Weight: 157 lb 6.4 oz (71.4 kg)  Height: 5\' 6"  (1.676 m)   Body mass index is 25.41 kg/m. Physical Exam Vitals reviewed.   Constitutional:      Appearance: Normal appearance.  HENT:     Head: Normocephalic.     Nose: Nose normal.     Mouth/Throat:     Mouth: Mucous membranes are moist.     Pharynx: Oropharynx is clear.  Eyes:     Pupils: Pupils are equal, round, and reactive to light.  Cardiovascular:     Rate and Rhythm: Normal rate. Rhythm irregular.     Pulses: Normal pulses.  Pulmonary:     Effort: Pulmonary effort is normal. No respiratory distress.     Breath sounds: Normal breath sounds. No wheezing or rales.  Abdominal:     General: Abdomen is flat. Bowel sounds are normal.     Palpations: Abdomen is soft.  Musculoskeletal:        General: No swelling.     Cervical back: Neck supple.  Skin:    General: Skin is warm.  Neurological:     General: No focal deficit present.     Mental Status: He is alert and oriented to person, place, and time.  Psychiatric:        Mood and Affect: Mood normal.        Thought Content: Thought content normal.     Labs reviewed: Basic Metabolic Panel: Recent Labs    09/06/20 0145 09/06/20 0145 09/08/20 0322 09/09/20 0351 09/13/20 0000  NA 137   < > 133* 135 137  K 4.0   < > 3.6 3.6 3.8  CL 104   < > 101 101 101  CO2 23   < > 24 25 28*  GLUCOSE 95  --  108* 96  --   BUN 19   < > 29* 23 26*  CREATININE 1.19   < > 1.53* 1.25* 1.1  CALCIUM 8.5*   < > 8.5* 8.7* 8.8   < > = values in this interval not displayed.   Liver Function Tests: No results for input(s): AST, ALT, ALKPHOS, BILITOT,  PROT, ALBUMIN in the last 8760 hours. No results for input(s): LIPASE, AMYLASE in the last 8760 hours. No results for input(s): AMMONIA in the last 8760 hours. CBC: Recent Labs    09/05/20 0907 09/05/20 0913 09/06/20 0145 09/06/20 0145 09/08/20 0322 09/09/20 0351 09/13/20 0000  WBC 11.3*   < > 9.1   < > 10.9* 10.1 9.5  NEUTROABS 8.2*  --   --   --   --   --   --   HGB 12.3*   < > 10.9*   < > 9.5* 10.0* 10.6*  HCT 38.2*   < > 33.3*   < > 29.5* 30.3* 32*   MCV 92.9   < > 92.5  --  92.5 92.7  --   PLT 185   < > 167   < > 138* 180 286   < > = values in this interval not displayed.   Cardiac Enzymes: No results for input(s): CKTOTAL, CKMB, CKMBINDEX, TROPONINI in the last 8760 hours. BNP: Invalid input(s): POCBNP No results found for: HGBA1C No results found for: TSH Lab Results  Component Value Date   VITAMINB12 1,382 (H) 08/16/2018   No results found for: FOLATE No results found for: IRON, TIBC, FERRITIN  Imaging and Procedures obtained prior to SNF admission: DG Chest 1 View  Result Date: 09/05/2020 CLINICAL DATA:  Fall with RIGHT chest pain. EXAM: CHEST  1 VIEW COMPARISON:  06/04/2020 prior studies FINDINGS: Cardiomegaly and LEFT-sided pacemaker again noted. There is no evidence of focal airspace disease, pulmonary edema, suspicious pulmonary nodule/mass, pleural effusion, or pneumothorax. No acute bony abnormalities are identified. IMPRESSION: Cardiomegaly without evidence of acute cardiopulmonary disease. Electronically Signed   By: Margarette Canada M.D.   On: 09/05/2020 10:16   DG Clavicle Right  Result Date: 09/05/2020 CLINICAL DATA:  Acute RIGHT clavicle pain following fall today. Initial encounter. EXAM: RIGHT CLAVICLE - 2+ VIEWS COMPARISON:  Prior chest and shoulder radiographs FINDINGS: A vertical fracture of the mid RIGHT clavicle is noted with 3 mm distraction. No other acute bony abnormalities are noted. IMPRESSION: Minimally distracted mid RIGHT clavicle fracture. Electronically Signed   By: Margarette Canada M.D.   On: 09/05/2020 10:15   DG Shoulder Right  Result Date: 09/05/2020 CLINICAL DATA:  Fall with right shoulder pain EXAM: RIGHT SHOULDER - 2+ VIEW COMPARISON:  None. FINDINGS: Right mid clavicle fracture with branching component extending lateral to the level of the coracoclavicular interval. There is pending clavicle radiograph. No glenohumeral fracture or dislocation. Located acromioclavicular joint. Prominent osteopenia  IMPRESSION: Mid right clavicle fracture extending to the level of the coracoclavicular interval. Electronically Signed   By: Monte Fantasia M.D.   On: 09/05/2020 10:20   CT Head Wo Contrast  Result Date: 09/05/2020 CLINICAL DATA:  Status post fall. EXAM: CT HEAD WITHOUT CONTRAST CT CERVICAL SPINE WITHOUT CONTRAST TECHNIQUE: Multidetector CT imaging of the head and cervical spine was performed following the standard protocol without intravenous contrast. Multiplanar CT image reconstructions of the cervical spine were also generated. COMPARISON:  None. FINDINGS: CT HEAD FINDINGS Brain: Mild diffuse cortical atrophy is noted. Mild chronic ischemic white matter disease is noted. No mass effect or midline shift is noted. Ventricular size is within normal limits. There is no evidence of mass lesion, hemorrhage or acute infarction. Vascular: No hyperdense vessel or unexpected calcification. Skull: Normal. Negative for fracture or focal lesion. Sinuses/Orbits: No acute finding. Other: None. CT CERVICAL SPINE FINDINGS Alignment: Normal. Skull base and vertebrae: No acute  fracture. No primary bone lesion or focal pathologic process. Soft tissues and spinal canal: No prevertebral fluid or swelling. No visible canal hematoma. Disc levels: Moderate degenerative disc disease is noted at C5-6 and C6-7. Upper chest: Negative. Other: Degenerative changes are seen involving posterior facet joints bilaterally. IMPRESSION: 1. Mild diffuse cortical atrophy. Mild chronic ischemic white matter disease. No acute intracranial abnormality seen. 2. Multilevel degenerative disc disease. No acute abnormality seen in the cervical spine. Electronically Signed   By: Marijo Conception M.D.   On: 09/05/2020 10:50   CT Cervical Spine Wo Contrast  Result Date: 09/05/2020 CLINICAL DATA:  Status post fall. EXAM: CT HEAD WITHOUT CONTRAST CT CERVICAL SPINE WITHOUT CONTRAST TECHNIQUE: Multidetector CT imaging of the head and cervical spine was  performed following the standard protocol without intravenous contrast. Multiplanar CT image reconstructions of the cervical spine were also generated. COMPARISON:  None. FINDINGS: CT HEAD FINDINGS Brain: Mild diffuse cortical atrophy is noted. Mild chronic ischemic white matter disease is noted. No mass effect or midline shift is noted. Ventricular size is within normal limits. There is no evidence of mass lesion, hemorrhage or acute infarction. Vascular: No hyperdense vessel or unexpected calcification. Skull: Normal. Negative for fracture or focal lesion. Sinuses/Orbits: No acute finding. Other: None. CT CERVICAL SPINE FINDINGS Alignment: Normal. Skull base and vertebrae: No acute fracture. No primary bone lesion or focal pathologic process. Soft tissues and spinal canal: No prevertebral fluid or swelling. No visible canal hematoma. Disc levels: Moderate degenerative disc disease is noted at C5-6 and C6-7. Upper chest: Negative. Other: Degenerative changes are seen involving posterior facet joints bilaterally. IMPRESSION: 1. Mild diffuse cortical atrophy. Mild chronic ischemic white matter disease. No acute intracranial abnormality seen. 2. Multilevel degenerative disc disease. No acute abnormality seen in the cervical spine. Electronically Signed   By: Marijo Conception M.D.   On: 09/05/2020 10:50   CT PELVIS WO CONTRAST  Result Date: 09/05/2020 CLINICAL DATA:  Right hip pain after fall. EXAM: CT PELVIS WITHOUT CONTRAST TECHNIQUE: Multidetector CT imaging of the pelvis was performed following the standard protocol without intravenous contrast. COMPARISON:  September 16, 2012. FINDINGS: Urinary Tract:  No abnormality visualized. Bowel:  Unremarkable visualized pelvic bowel loops. Vascular/Lymphatic: No pathologically enlarged lymph nodes. No significant vascular abnormality seen. Reproductive:  No mass or other significant abnormality Other: Stable large left inguinal hernia is noted which contains a loop of  colon, but does not result in obstruction. Stable large fat containing right inguinal hernia is noted as well. No ascites is noted. Musculoskeletal: Probable acute nondisplaced fracture is seen involving the right superior pubic ramus which extends posteriorly toward the medial acetabular area, although does not appear to involve the articular surface of the right hip. IMPRESSION: 1. Probable acute nondisplaced fracture is seen involving the right superior pubic ramus which extends posteriorly toward the medial acetabular area, although does not appear to involve the articular surface of the right hip. 2. Stable large left inguinal hernia is noted which contains a loop of colon, but does not result in obstruction. 3. Stable large fat containing right inguinal hernia is noted as well. Electronically Signed   By: Marijo Conception M.D.   On: 09/05/2020 11:57   DG Knee Complete 4 Views Right  Result Date: 09/05/2020 CLINICAL DATA:  Acute RIGHT knee pain following fall today. Initial encounter. EXAM: RIGHT KNEE - COMPLETE 4+ VIEW COMPARISON:  None. FINDINGS: No acute fracture, dislocation or joint effusion. Mild joint space narrowing osteophytosis  in all 3 compartments noted. No focal bony lesions are present. Vascular calcifications are identified. IMPRESSION: 1. No acute abnormality. 2. Mild tricompartmental degenerative changes. Electronically Signed   By: Margarette Canada M.D.   On: 09/05/2020 10:24   DG Hip Unilat W or Wo Pelvis 2-3 Views Right  Result Date: 09/05/2020 CLINICAL DATA:  Acute RIGHT hip pain following fall today. Initial encounter. EXAM: DG HIP (WITH OR WITHOUT PELVIS) 2-3V RIGHT COMPARISON:  06/13/2014 FINDINGS: There is mild cortical irregularity along the RIGHT SUPERIOR and INFERIOR pubic rami which has a different appearance since 06/13/2014 and may represent nondisplaced fractures. No other fracture, subluxation or dislocation identified. Degenerative changes in both hips are present. No  suspicious focal bony lesions are present. IMPRESSION: Mild cortical irregularity of the RIGHT SUPERIOR and INFERIOR pubic rami which may represent nondisplaced fractures. Consider CT for further evaluation as clinically indicated. Electronically Signed   By: Margarette Canada M.D.   On: 09/05/2020 10:35    Assessment/Plan Pubic ramus fracture, right, closed,  Conservative management Working with therapy Per ortho WBAT  Knee brace PRN Closed traumatic minimally displaced fracture of shaft of right clavicle with routine healing, subsequent encounter [facility]  Wll schedule Norco BID per his request Continue PRN Q 6  Pacemaker [place] [facility] Dual Chamber pacemaker 06/03/2018 Follows with Dr Shonna Chock  Atrial fibrillation, chronic ([place]) ON Cardizem and Eliquis  Essential hypertension Continue Cardizem and HCTZ  Anemia, unspecified type ? Etiology ? Due to CKD Repeat CBC.   Stage 3a chronic kidney disease (HCC) Repeat BMP  Hyponatremia Stable  Insomnia secondary to depression with anxiety On High dose of melatonin Will change it to 5 mg Slow transit constipation Start on Senna with Miralax Sciatica, left side Was told by his PCP to stay on Prednisone  Gastroesophageal reflux disease, unspecified whether esophagitis present Prilosec Poor appetite Continue Remeron  Helps his Insomnia also Consider changing dose if no improvement Hyperlipidemia On Lipitor  Family/ staff Communication:   Labs/tests ordered:

## 2020-09-17 ENCOUNTER — Other Ambulatory Visit: Payer: Self-pay

## 2020-09-17 MED ORDER — HYDROCODONE-ACETAMINOPHEN 5-325 MG PO TABS: ORAL_TABLET | ORAL | 0 refills | Status: DC

## 2020-09-20 LAB — HEPATIC FUNCTION PANEL
ALT: 29 (ref 10–40)
AST: 19 (ref 14–40)
Alkaline Phosphatase: 183 — AB (ref 25–125)
Bilirubin, Total: 0.5

## 2020-09-20 LAB — CBC AND DIFFERENTIAL
HCT: 30 — AB (ref 41–53)
Hemoglobin: 9.8 — AB (ref 13.5–17.5)
Neutrophils Absolute: 3912
Platelets: 305 (ref 150–399)
WBC: 6.9

## 2020-09-20 LAB — BASIC METABOLIC PANEL
BUN: 20 (ref 4–21)
CO2: 30 — AB (ref 13–22)
Chloride: 102 (ref 99–108)
Creatinine: 1.2 (ref 0.6–1.3)
Glucose: 88
Potassium: 4.3 (ref 3.4–5.3)
Sodium: 137 (ref 137–147)

## 2020-09-20 LAB — COMPREHENSIVE METABOLIC PANEL
Albumin: 2.9 — AB (ref 3.5–5.0)
Calcium: 8.5 — AB (ref 8.7–10.7)
Globulin: 2.5

## 2020-09-20 LAB — CBC: RBC: 3.25 — AB (ref 3.87–5.11)

## 2020-09-27 LAB — LIPID PANEL
Cholesterol: 137 (ref 0–200)
HDL: 44 (ref 35–70)
LDL Cholesterol: 75
LDl/HDL Ratio: 3.1
Triglycerides: 92 (ref 40–160)

## 2020-09-28 ENCOUNTER — Encounter: Payer: Self-pay | Admitting: Nurse Practitioner

## 2020-09-28 DIAGNOSIS — E785 Hyperlipidemia, unspecified: Secondary | ICD-10-CM | POA: Insufficient documentation

## 2020-10-01 ENCOUNTER — Encounter: Payer: Self-pay | Admitting: Nurse Practitioner

## 2020-10-01 ENCOUNTER — Non-Acute Institutional Stay (SKILLED_NURSING_FACILITY): Payer: Medicare Other | Admitting: Nurse Practitioner

## 2020-10-01 DIAGNOSIS — I10 Essential (primary) hypertension: Secondary | ICD-10-CM

## 2020-10-01 DIAGNOSIS — D649 Anemia, unspecified: Secondary | ICD-10-CM

## 2020-10-01 DIAGNOSIS — R609 Edema, unspecified: Secondary | ICD-10-CM

## 2020-10-01 DIAGNOSIS — F5105 Insomnia due to other mental disorder: Secondary | ICD-10-CM | POA: Diagnosis not present

## 2020-10-01 DIAGNOSIS — Z950 Presence of cardiac pacemaker: Secondary | ICD-10-CM

## 2020-10-01 DIAGNOSIS — K219 Gastro-esophageal reflux disease without esophagitis: Secondary | ICD-10-CM

## 2020-10-01 DIAGNOSIS — I482 Chronic atrial fibrillation, unspecified: Secondary | ICD-10-CM

## 2020-10-01 DIAGNOSIS — K5901 Slow transit constipation: Secondary | ICD-10-CM

## 2020-10-01 DIAGNOSIS — S42021D Displaced fracture of shaft of right clavicle, subsequent encounter for fracture with routine healing: Secondary | ICD-10-CM

## 2020-10-01 DIAGNOSIS — N1831 Chronic kidney disease, stage 3a: Secondary | ICD-10-CM

## 2020-10-01 DIAGNOSIS — S32591A Other specified fracture of right pubis, initial encounter for closed fracture: Secondary | ICD-10-CM

## 2020-10-01 DIAGNOSIS — R350 Frequency of micturition: Secondary | ICD-10-CM

## 2020-10-01 DIAGNOSIS — E785 Hyperlipidemia, unspecified: Secondary | ICD-10-CM

## 2020-10-01 DIAGNOSIS — F418 Other specified anxiety disorders: Secondary | ICD-10-CM

## 2020-10-01 DIAGNOSIS — M5432 Sciatica, left side: Secondary | ICD-10-CM

## 2020-10-01 DIAGNOSIS — E871 Hypo-osmolality and hyponatremia: Secondary | ICD-10-CM

## 2020-10-01 NOTE — Assessment & Plan Note (Signed)
Hospitalized 09/05/20-09/09/20 for a mechanical fall at home, resulted R clavicle/placed on sling and right pelvis ramus fx/WBAT. Improving, working with therapy, prn Norco for pain.

## 2020-10-01 NOTE — Assessment & Plan Note (Signed)
Anemia, mild, Hgb 10 09/09/20, 10.6 09/13/20

## 2020-10-01 NOTE — Assessment & Plan Note (Signed)
GERD, takes Omeprazole  

## 2020-10-01 NOTE — Assessment & Plan Note (Signed)
HTN, takes Diltiazem, HCTZ

## 2020-10-01 NOTE — Assessment & Plan Note (Signed)
Prednisone 2mg  qd for chronic left sciatica pain.

## 2020-10-01 NOTE — Assessment & Plan Note (Signed)
Hyperlipidemia, takes Atorvastatin 10mg  qd

## 2020-10-01 NOTE — Assessment & Plan Note (Addendum)
Edema BLE, new since admitted to SNF, aches/heaviness reported, negative Homans sign, weak DP pulses. Denied cough, SOB, chest pain/pressure, palpitation. #7 Ibs gained in the past 2 weeks.  Dc HCTZ, will start Furosemide 31m qd, Kcl 262m qd. Repeat CMP/eGFR one week. No echocardiogram found in Epic, will monitor for s/s of developing CHF. May have edema in legs in the past since venous [place] found 06/02/2008 and the patent is taking low dose of HCTZ. May consider [place]ous BLE if no better.

## 2020-10-01 NOTE — Assessment & Plan Note (Signed)
AFib, Eliquis, CHA2DS2-VAS score >3, f/u Dr. [name]

## 2020-10-01 NOTE — Assessment & Plan Note (Signed)
Hyponatremia, Na 135 09/09/20, 137 09/13/20

## 2020-10-01 NOTE — Assessment & Plan Note (Signed)
Urinary frequency, takes Tamsulosin 0.4mg  qd

## 2020-10-01 NOTE — Assessment & Plan Note (Signed)
Depression/insmonia, takes Mirtazapine 7.5mg qhs 

## 2020-10-01 NOTE — Progress Notes (Signed)
Location:    [facility] Room Number: 32 Place of Service:  SNF (31) Provider: Lennie Odor Chazlyn Cude NP  Virgie Dad, MD  Patient Care Team: Virgie Dad, MD as PCP - General (Internal Medicine)  Extended Emergency Contact Information Primary Emergency Contact: Michelle Nasuti Address: [address]          [place], [place] [address] Johnnette Litter of [place] Phone: [phone] Work Phone: [phone] Mobile Phone: [phone] Relation: Son  Code Status:  DNR Goals of care: Advanced Directive information Advanced Directives 10/01/2020  Does Patient Have a Medical Advance Directive? Yes  Type of Paramedic of [place];Living will;Out of facility DNR (pink MOST or yellow form)  Does patient want to make changes to medical advance directive? No - Patient declined  Copy of [place] in Chart? Yes - validated most recent copy scanned in chart (See row information)  Would patient like information on creating a medical advance directive? -  Pre-existing out of facility DNR order (yellow form or pink MOST form) Yellow form placed in chart (order not valid for inpatient use)     Chief Complaint  Patient presents with  . Medical Management of Chronic Issues    Routine [facility] SNF visit    HPI:  Pt is a 86 y.o. male seen today for medical management of chronic diseases.     Hospitalized 09/05/20-09/09/20 for a mechanical fall at home, resulted R clavicle/placed on sling and right pelvis ramus fx/WBAT. Improving, working with therapy, prn Norco for pain.              Pacemaker 07/04/18,  f/u [name], interrogation for PM issues.              AFib, Eliquis, CHA2DS2-VAS score >3, f/u Dr. [name]             HTN, takes Diltiazem, HCTZ             Anemia, mild, Hgb 10 09/09/20, 10.6 09/13/20             CKD Bun/creat 26/1.1 09/13/20             Hyponatremia, Na 135 09/09/20, 137 09/13/20             Urinary  frequency, takes Tamsulosin 0.52m qd             Prednisone 266mqd for chronic left sciatica pain.              GERD, takes Omeprazole              Depression/insmonia, takes Mirtazapine 7.85m585mhs             Hyperlipidemia, takes Atorvastatin 65m108m, LDL 75 09/27/20             Constipation, takes Colace, Senna, MiraLax.  Past Medical History:  Diagnosis Date  . Ankle edema   . Anxiety   . Dysrhythmia    a-fib/flutter- on eliquis  . GERD (gastroesophageal reflux disease)    no treatment  . Hard of hearing   . Heart block AV complete (HCC)[place]18/2021  . Hypertension   . Kidney stone   . Presence of permanent cardiac pacemaker 06/03/2020  . Spinal stenosis   . Tachy-brady syndrome (HCC)Leslie22/2021   Past Surgical History:  Procedure Laterality Date  . addnoid    . CYSTOSCOPY W/ URETEROSCOPY  1980  . INSERT / REPLACE / REMOVE PACEMAKER  06/03/2020  . KNEE SURGERY    . NAILBED REPAIR Left 06/27/2016   Procedure: NAILBED biopsy left thumb;  Surgeon: Daryll Brod, MD;  Location: [facility];  Service: Orthopedics;  Laterality: Left;  FAB  . PACEMAKER IMPLANT N/A 06/03/2020   Procedure: PACEMAKER IMPLANT;  Surgeon: Constance Haw, MD;  Location: [facility] CV LAB;  Service: Cardiovascular;  Laterality: N/A;  . TONSILLECTOMY      No Known Allergies  Allergies as of 10/01/2020   No Known Allergies     Medication List       Accurate as of October 01, 2020 11:59 PM. If you have any questions, ask your nurse or doctor.        STOP taking these medications   docusate sodium 100 MG capsule Commonly known as: Colace Stopped by: Frutoso Dimare X Jakeim Sedore, NP     TAKE these medications   apixaban 2.5 MG Tabs tablet Commonly known as: ELIQUIS Take 1 tablet (2.5 mg total) by mouth 2 (two) times daily.   atorvastatin 10 MG tablet Commonly known as: LIPITOR Take 10 mg by mouth every evening.   diltiazem 120 MG tablet Commonly known as: CARDIZEM Take 120 mg by mouth  daily.   hydrochlorothiazide 12.5 MG tablet Commonly known as: HYDRODIURIL Take 12.5 mg by mouth daily.   HYDROcodone-acetaminophen 5-325 MG tablet Commonly known as: NORCO/VICODIN Take 1 tablet by mouth in the morning and at bedtime.   HYDROcodone-acetaminophen 5-325 MG tablet Commonly known as: NORCO/VICODIN Take 1 tablet by mouth twice daily; 1 tablet by mouth every 6 hours as needed for break through pain   ICAPS AREDS 2 PO Take 2 tablets by mouth 2 (two) times daily.   lactose free nutrition Liqd Take 237 mLs by mouth 2 (two) times daily between meals.   Melatonin 10 MG Caps Take 5 mg by mouth at bedtime.   mirtazapine 7.5 MG tablet Commonly known as: REMERON Take 7.5 mg by mouth at bedtime.   omeprazole 20 MG capsule Commonly known as: PRILOSEC Take 20 mg by mouth daily with breakfast.   polyethylene glycol 17 g packet Commonly known as: MIRALAX / GLYCOLAX Take 17 g by mouth daily.   predniSONE 2 MG Tbec Take 2 mg by mouth daily.   Senna-Plus 8.6-50 MG tablet Generic drug: senna-docusate Take 1 tablet by mouth daily.   tamsulosin 0.4 MG Caps capsule Commonly known as: FLOMAX Take 0.4 mg by mouth daily.   timolol 0.5 % ophthalmic solution Commonly known as: TIMOPTIC Place 1 drop into both eyes daily.   zinc oxide 20 % ointment Apply 1 application topically as needed for irritation.       Review of Systems  Constitutional: Positive for unexpected weight change. Negative for diaphoresis, fatigue and fever.       #& Ibs weight gained in 2 weeks.   HENT: Positive for hearing loss. Negative for congestion, trouble swallowing and voice change.        Hearing aids.   Eyes: Negative for visual disturbance.  Respiratory: Negative for cough, chest tightness and shortness of breath.   Cardiovascular: Positive for leg swelling. Negative for chest pain and palpitations.  Gastrointestinal: Negative for abdominal pain, blood in stool and constipation.       R+L  inguinal hernia.   Genitourinary: Positive for frequency. Negative for difficulty urinating and dysuria.       2-3 x/night, urinary leakage uses adult brief or condom cath  Musculoskeletal: Positive for arthralgias, back pain  and gait problem.       Left sciatica pain   Skin: Positive for color change.       Bruise left shoulder, arm, chest  Neurological: Positive for numbness. Negative for speech difficulty, weakness and headaches.       Feels numb in fingers, but able to make fist  Psychiatric/Behavioral: Negative for behavioral problems, confusion and sleep disturbance. The patient is not nervous/anxious.     Immunization History  Administered Date(s) Administered  . Influenza, High Dose Seasonal PF 08/07/2016  . Moderna SARS-COV2 Booster Vaccination 09/09/2020   Pertinent  Health Maintenance Due  Topic Date Due  . PNA vac Low Risk Adult (1 of 2 - PCV13) Never done  . INFLUENZA VACCINE  06/13/2020   No flowsheet data found. Functional Status Survey:    Vitals:   10/01/20 1547  BP: 127/77  Pulse: 77  Resp: 20  Temp: (!) 97.1 F (36.2 C)  SpO2: 96%  Weight: 164 lb 8 oz (74.6 kg)  Height: _0  (1.676 m)   Body mass index is 26.55 kg/m. Physical Exam Vitals and nursing note reviewed.  Constitutional:      Appearance: Normal appearance.  HENT:     Nose: Nose normal.     Mouth/Throat:     Mouth: Mucous membranes are moist.  Eyes:     Extraocular Movements: Extraocular movements intact.     Conjunctiva/sclera: Conjunctivae normal.     Pupils: Pupils are equal, round, and reactive to light.  Cardiovascular:     Rate and Rhythm: Normal rate and regular rhythm.     Comments: Pacemaker.  Pulmonary:     Effort: Pulmonary effort is normal.     Breath sounds: No wheezing, rhonchi or rales.  Abdominal:     General: Bowel sounds are normal.     Palpations: Abdomen is soft.     Tenderness: There is no abdominal tenderness.     Hernia: A hernia is present.      Comments: R+L hermina  Musculoskeletal:        General: Tenderness present.     Cervical back: Normal range of motion and neck supple.     Right lower leg: Edema present.     Left lower leg: Edema present.     Comments: The right  arm in sling, occasional tingling in the right fingers, not today. Edema BLE, new since admitted to SNF, aches/heaviness reported, negative Homans sign, weak DP pulses.   Skin:    General: Skin is warm and dry.     Findings: Bruising present.     Comments: Left upper chest bruise.   Neurological:     General: No focal deficit present.     Mental Status: He is alert and oriented to person, place, and time. Mental status is at baseline.     Motor: No weakness.     Coordination: Coordination normal.     Gait: Gait abnormal.  Psychiatric:        Mood and Affect: Mood normal.        Behavior: Behavior normal.        Thought Content: Thought content normal.        Judgment: Judgment normal.     Labs reviewed: Recent Labs    09/06/20 0145 09/06/20 0145 09/08/20 0322 09/08/20 0322 09/09/20 0351 09/13/20 0000 09/20/20 0000  NA 137   < > 133*   < > 135 137 137  K 4.0   < > 3.6   < >  3.6 3.8 4.3  CL 104   < > 101   < > 101 101 102  CO2 23   < > 24   < > 25 28* 30*  GLUCOSE 95  --  108*  --  96  --   --   BUN 19   < > 29*   < > 23 26* 20  CREATININE 1.19   < > 1.53*   < > 1.25* 1.1 1.2  CALCIUM 8.5*   < > 8.5*   < > 8.7* 8.8 8.5*   < > = values in this interval not displayed.   Recent Labs    09/20/20 0000  AST 19  ALT 29  ALKPHOS 183*  ALBUMIN 2.9*   Recent Labs    09/05/20 0907 09/05/20 0913 09/06/20 0145 09/06/20 0145 09/08/20 0322 09/08/20 0322 09/09/20 0351 09/13/20 0000 09/20/20 0000  WBC 11.3*   < > 9.1   < > 10.9*   < > 10.1 9.5 6.9  NEUTROABS 8.2*  --   --   --   --   --   --   --  3,912.00  HGB 12.3*   < > 10.9*   < > 9.5*   < > 10.0* 10.6* 9.8*  HCT 38.2*   < > 33.3*   < > 29.5*   < > 30.3* 32* 30*  MCV 92.9   < > 92.5  --   92.5  --  92.7  --   --   PLT 185   < > 167   < > 138*   < > 180 286 305   < > = values in this interval not displayed.   No results found for: TSH No results found for: HGBA1C Lab Results  Component Value Date   CHOL 137 09/27/2020   HDL 44 09/27/2020   LDLCALC 75 09/27/2020   TRIG 92 09/27/2020    Significant Diagnostic Results in last 30 days:  DG Chest 1 View  Result Date: 09/05/2020 CLINICAL DATA:  Fall with RIGHT chest pain. EXAM: CHEST  1 VIEW COMPARISON:  06/04/2020 prior studies FINDINGS: Cardiomegaly and LEFT-sided pacemaker again noted. There is no evidence of focal airspace disease, pulmonary edema, suspicious pulmonary nodule/mass, pleural effusion, or pneumothorax. No acute bony abnormalities are identified. IMPRESSION: Cardiomegaly without evidence of acute cardiopulmonary disease. Electronically Signed   By: Margarette Canada M.D.   On: 09/05/2020 10:16   DG Clavicle Right  Result Date: 09/05/2020 CLINICAL DATA:  Acute RIGHT clavicle pain following fall today. Initial encounter. EXAM: RIGHT CLAVICLE - 2+ VIEWS COMPARISON:  Prior chest and shoulder radiographs FINDINGS: A vertical fracture of the mid RIGHT clavicle is noted with 3 mm distraction. No other acute bony abnormalities are noted. IMPRESSION: Minimally distracted mid RIGHT clavicle fracture. Electronically Signed   By: Margarette Canada M.D.   On: 09/05/2020 10:15   DG Shoulder Right  Result Date: 09/05/2020 CLINICAL DATA:  Fall with right shoulder pain EXAM: RIGHT SHOULDER - 2+ VIEW COMPARISON:  None. FINDINGS: Right mid clavicle fracture with branching component extending lateral to the level of the coracoclavicular interval. There is pending clavicle radiograph. No glenohumeral fracture or dislocation. Located acromioclavicular joint. Prominent osteopenia IMPRESSION: Mid right clavicle fracture extending to the level of the coracoclavicular interval. Electronically Signed   By: Monte Fantasia M.D.   On: 09/05/2020 10:20     CT Head Wo Contrast  Result Date: 09/05/2020 CLINICAL DATA:  Status post fall. EXAM: CT  HEAD WITHOUT CONTRAST CT CERVICAL SPINE WITHOUT CONTRAST TECHNIQUE: Multidetector CT imaging of the head and cervical spine was performed following the standard protocol without intravenous contrast. Multiplanar CT image reconstructions of the cervical spine were also generated. COMPARISON:  None. FINDINGS: CT HEAD FINDINGS Brain: Mild diffuse cortical atrophy is noted. Mild chronic ischemic white matter disease is noted. No mass effect or midline shift is noted. Ventricular size is within normal limits. There is no evidence of mass lesion, hemorrhage or acute infarction. Vascular: No hyperdense vessel or unexpected calcification. Skull: Normal. Negative for fracture or focal lesion. Sinuses/Orbits: No acute finding. Other: None. CT CERVICAL SPINE FINDINGS Alignment: Normal. Skull base and vertebrae: No acute fracture. No primary bone lesion or focal pathologic process. Soft tissues and spinal canal: No prevertebral fluid or swelling. No visible canal hematoma. Disc levels: Moderate degenerative disc disease is noted at C5-6 and C6-7. Upper chest: Negative. Other: Degenerative changes are seen involving posterior facet joints bilaterally. IMPRESSION: 1. Mild diffuse cortical atrophy. Mild chronic ischemic white matter disease. No acute intracranial abnormality seen. 2. Multilevel degenerative disc disease. No acute abnormality seen in the cervical spine. Electronically Signed   By: Marijo Conception M.D.   On: 09/05/2020 10:50   CT Cervical Spine Wo Contrast  Result Date: 09/05/2020 CLINICAL DATA:  Status post fall. EXAM: CT HEAD WITHOUT CONTRAST CT CERVICAL SPINE WITHOUT CONTRAST TECHNIQUE: Multidetector CT imaging of the head and cervical spine was performed following the standard protocol without intravenous contrast. Multiplanar CT image reconstructions of the cervical spine were also generated. COMPARISON:  None.  FINDINGS: CT HEAD FINDINGS Brain: Mild diffuse cortical atrophy is noted. Mild chronic ischemic white matter disease is noted. No mass effect or midline shift is noted. Ventricular size is within normal limits. There is no evidence of mass lesion, hemorrhage or acute infarction. Vascular: No hyperdense vessel or unexpected calcification. Skull: Normal. Negative for fracture or focal lesion. Sinuses/Orbits: No acute finding. Other: None. CT CERVICAL SPINE FINDINGS Alignment: Normal. Skull base and vertebrae: No acute fracture. No primary bone lesion or focal pathologic process. Soft tissues and spinal canal: No prevertebral fluid or swelling. No visible canal hematoma. Disc levels: Moderate degenerative disc disease is noted at C5-6 and C6-7. Upper chest: Negative. Other: Degenerative changes are seen involving posterior facet joints bilaterally. IMPRESSION: 1. Mild diffuse cortical atrophy. Mild chronic ischemic white matter disease. No acute intracranial abnormality seen. 2. Multilevel degenerative disc disease. No acute abnormality seen in the cervical spine. Electronically Signed   By: Marijo Conception M.D.   On: 09/05/2020 10:50   CT PELVIS WO CONTRAST  Result Date: 09/05/2020 CLINICAL DATA:  Right hip pain after fall. EXAM: CT PELVIS WITHOUT CONTRAST TECHNIQUE: Multidetector CT imaging of the pelvis was performed following the standard protocol without intravenous contrast. COMPARISON:  September 16, 2012. FINDINGS: Urinary Tract:  No abnormality visualized. Bowel:  Unremarkable visualized pelvic bowel loops. Vascular/Lymphatic: No pathologically enlarged lymph nodes. No significant vascular abnormality seen. Reproductive:  No mass or other significant abnormality Other: Stable large left inguinal hernia is noted which contains a loop of colon, but does not result in obstruction. Stable large fat containing right inguinal hernia is noted as well. No ascites is noted. Musculoskeletal: Probable acute  nondisplaced fracture is seen involving the right superior pubic ramus which extends posteriorly toward the medial acetabular area, although does not appear to involve the articular surface of the right hip. IMPRESSION: 1. Probable acute nondisplaced fracture is seen involving the right  superior pubic ramus which extends posteriorly toward the medial acetabular area, although does not appear to involve the articular surface of the right hip. 2. Stable large left inguinal hernia is noted which contains a loop of colon, but does not result in obstruction. 3. Stable large fat containing right inguinal hernia is noted as well. Electronically Signed   By: Marijo Conception M.D.   On: 09/05/2020 11:57   DG Knee Complete 4 Views Right  Result Date: 09/05/2020 CLINICAL DATA:  Acute RIGHT knee pain following fall today. Initial encounter. EXAM: RIGHT KNEE - COMPLETE 4+ VIEW COMPARISON:  None. FINDINGS: No acute fracture, dislocation or joint effusion. Mild joint space narrowing osteophytosis in all 3 compartments noted. No focal bony lesions are present. Vascular calcifications are identified. IMPRESSION: 1. No acute abnormality. 2. Mild tricompartmental degenerative changes. Electronically Signed   By: Margarette Canada M.D.   On: 09/05/2020 10:24   DG Hip Unilat W or Wo Pelvis 2-3 Views Right  Result Date: 09/05/2020 CLINICAL DATA:  Acute RIGHT hip pain following fall today. Initial encounter. EXAM: DG HIP (WITH OR WITHOUT PELVIS) 2-3V RIGHT COMPARISON:  06/13/2014 FINDINGS: There is mild cortical irregularity along the RIGHT SUPERIOR and INFERIOR pubic rami which has a different appearance since 06/13/2014 and may represent nondisplaced fractures. No other fracture, subluxation or dislocation identified. Degenerative changes in both hips are present. No suspicious focal bony lesions are present. IMPRESSION: Mild cortical irregularity of the RIGHT SUPERIOR and INFERIOR pubic rami which may represent nondisplaced  fractures. Consider CT for further evaluation as clinically indicated. Electronically Signed   By: Margarette Canada M.D.   On: 09/05/2020 10:35    Assessment/Plan Slow transit constipation Constipation, takes Colace, Senna, MiraLax.    Hyperlipidemia Hyperlipidemia, takes Atorvastatin 48m qd   Insomnia secondary to depression with anxiety Depression/insmonia, takes Mirtazapine 7.5612mqhs   GERD (gastroesophageal reflux disease) GERD, takes Omeprazole    Sciatica, left side Prednisone 12m10md for chronic left sciatica pain.   Urinary frequency Urinary frequency, takes Tamsulosin 0.4mg50m   Hyponatremia  Hyponatremia, Na 135 09/09/20, 137 09/13/20   CKD (chronic kidney disease) stage 3, GFR 30-59 ml/min (HCC) CKD Bun/creat 26/1.1 09/13/20  Anemia Anemia, mild, Hgb 10 09/09/20, 10.6 09/13/20  Essential hypertension HTN, takes Diltiazem, HCTZ   Atrial fibrillation, chronic (HCC) AFib, Eliquis, CHA2DS2-VAS score >3, f/u Dr. GanjLambert [employer] MRI Dual Chamber pacemaker 06/03/2018 Pacemaker 07/04/18,  f/u Dr. CamnCurt Bearsterrogation for PM issues.   Closed traumatic minimally displaced fracture of shaft of right clavicle Hospitalized 09/05/20-09/09/20 for a mechanical fall at home, resulted R clavicle/placed on sling and right pelvis ramus fx/WBAT. Improving, working with therapy, prn Norco for pain.    Pubic ramus fracture, right, closed, [facility] a mechanical fall at home, resulted R clavicle/placed on sling and right pelvis ramus fx/WBAT. Improving, working with therapy, prn Norco for pain.    Peripheral edema Edema BLE, new since admitted to SNF, aches/heaviness reported, negative Homans sign, weak DP pulses. Denied cough, SOB, chest pain/pressure, palpitation. #7 Ibs gained in the past 2 weeks.  Dc HCTZ, will start Furosemide 20mg85m Kcl 20meq412m Repeat CMP/eGFR one week. No echocardiogram found in  Epic, will monitor for s/s of developing CHF. May have edema in legs in the past since venous US [place] 06/02/2008 and the patent is taking low dose of HCTZ. May consider US ven[place]ore[place] BLE if no better.    Family/  staff Communication: plan of care reviewed with the patient and charge nurse.   Labs/tests ordered: none  Time spend 35 minutes

## 2020-10-01 NOTE — Assessment & Plan Note (Signed)
CKD Bun/creat 26/1.1 09/13/20

## 2020-10-01 NOTE — Assessment & Plan Note (Signed)
Pacemaker 07/04/18,  f/u [name], interrogation for PM issues.

## 2020-10-01 NOTE — Assessment & Plan Note (Signed)
Constipation, takes Colace, Senna, MiraLax.

## 2020-10-04 ENCOUNTER — Encounter: Payer: Self-pay | Admitting: Nurse Practitioner

## 2020-10-11 ENCOUNTER — Ambulatory Visit: Payer: Medicare Other | Admitting: Cardiology

## 2020-10-18 ENCOUNTER — Encounter: Payer: Self-pay | Admitting: Nurse Practitioner

## 2020-10-18 ENCOUNTER — Non-Acute Institutional Stay (SKILLED_NURSING_FACILITY): Payer: Medicare Other | Admitting: Nurse Practitioner

## 2020-10-18 DIAGNOSIS — E785 Hyperlipidemia, unspecified: Secondary | ICD-10-CM

## 2020-10-18 DIAGNOSIS — I482 Chronic atrial fibrillation, unspecified: Secondary | ICD-10-CM

## 2020-10-18 DIAGNOSIS — M5432 Sciatica, left side: Secondary | ICD-10-CM

## 2020-10-18 DIAGNOSIS — I10 Essential (primary) hypertension: Secondary | ICD-10-CM

## 2020-10-18 DIAGNOSIS — N1831 Chronic kidney disease, stage 3a: Secondary | ICD-10-CM

## 2020-10-18 DIAGNOSIS — F418 Other specified anxiety disorders: Secondary | ICD-10-CM

## 2020-10-18 DIAGNOSIS — I495 Sick sinus syndrome: Secondary | ICD-10-CM | POA: Diagnosis not present

## 2020-10-18 DIAGNOSIS — K219 Gastro-esophageal reflux disease without esophagitis: Secondary | ICD-10-CM

## 2020-10-18 DIAGNOSIS — K5901 Slow transit constipation: Secondary | ICD-10-CM

## 2020-10-18 DIAGNOSIS — D649 Anemia, unspecified: Secondary | ICD-10-CM

## 2020-10-18 DIAGNOSIS — S42021D Displaced fracture of shaft of right clavicle, subsequent encounter for fracture with routine healing: Secondary | ICD-10-CM

## 2020-10-18 DIAGNOSIS — R609 Edema, unspecified: Secondary | ICD-10-CM

## 2020-10-18 DIAGNOSIS — R350 Frequency of micturition: Secondary | ICD-10-CM

## 2020-10-18 DIAGNOSIS — F5105 Insomnia due to other mental disorder: Secondary | ICD-10-CM

## 2020-10-18 DIAGNOSIS — E871 Hypo-osmolality and hyponatremia: Secondary | ICD-10-CM

## 2020-10-18 NOTE — Assessment & Plan Note (Signed)
HTN, takes Diltiazem, HCTZ, Bun/creat 20/1.2 09/20/20

## 2020-10-18 NOTE — Assessment & Plan Note (Signed)
Prednisone 2mg  qd for chronic left sciatica pain.

## 2020-10-18 NOTE — Assessment & Plan Note (Signed)
Constipation, takes Colace, Senna, MiraLax.

## 2020-10-18 NOTE — Progress Notes (Signed)
Location:    [facility] Room Number: 32 Place of Service:  SNF (31) Provider:  Marda Stalker, Lennie Odor NP  Virgie Dad, MD  Patient Care Team: Virgie Dad, MD as PCP - General (Internal Medicine)  Extended Emergency Contact Information Primary Emergency Contact: Michelle Nasuti Address: [address]          [place], [place] [address] Johnnette Litter of [place] Phone: [phone] Work Phone: [phone] Mobile Phone: [phone] Relation: Son  Code Status:  DNR Goals of care: Advanced Directive information Advanced Directives 10/18/2020  Does Patient Have a Medical Advance Directive? Yes  Type of Advance Directive Out of facility DNR (pink MOST or yellow form);Living will;Healthcare Power of Attorney  Does patient want to make changes to medical advance directive? No - Patient declined  Copy of [place] in Chart? Yes - validated most recent copy scanned in chart (See row information)  Would patient like information on creating a medical advance directive? -  Pre-existing out of facility DNR order (yellow form or pink MOST form) Yellow form placed in chart (order not valid for inpatient use)     Chief Complaint  Patient presents with  . Medical Management of Chronic Issues  . Health Maintenance    TDAP    HPI:  Pt is a 86 y.o. male seen today for medical management of chronic diseases.     Hospitalized 09/05/20-09/09/20 for a mechanical fall at home, resulted R clavicle/placed on sling and right pelvis ramus fx/WBAT. Improving, working with therapy, prn Norco for pain.  Pacemaker 07/04/18,  f/u [name], interrogation for PM issues.  AFib, Eliquis, CHA2DS2-VAS score >3, f/u Dr. [name] HTN, takes Diltiazem, Furosemide,  Bun/creat 20/1.2 09/20/20 Anemia, mild, Hgb 10 09/09/20, 10.6 09/13/20, Hgb 9.8 09/20/20 CKD Bun/creat 20/1.2 10/11/20 Hyponatremia, Na 141  10/11/20 Urinary frequency, takes Tamsulosin 0.52m qd Prednisone 289mqd for chronic left sciatica pain. GERD, takes Omeprazole  Depression/insmonia, takes Mirtazapine 7.32m62mhs Hyperlipidemia, takes Atorvastatin 32m32m, LDL 75 09/27/20 Constipation, takes Colace, Senna, MiraLax.   BLE edema, takes Furosemide 20mg532m not adequate.  Past Medical History:  Diagnosis Date  . Ankle edema   . Anxiety   . Dysrhythmia    a-fib/flutter- on eliquis  . GERD (gastroesophageal reflux disease)    no treatment  . Hard of hearing   . Heart block AV complete (HCC) [place]8/2021  . Hypertension   . Kidney stone   . Presence of permanent cardiac pacemaker 06/03/2020  . Spinal stenosis   . Tachy-brady syndrome (HCC) [place]2/2021   Past Surgical History:  Procedure Laterality Date  . addnoid    . CYSTOSCOPY W/ URETEROSCOPY  1980  . INSERT / REPLACE / REMOVE PACEMAKER  06/03/2020  . KNEE SURGERY    . NAILBED REPAIR Left 06/27/2016   Procedure: NAILBED biopsy left thumb;  Surgeon: Gary Daryll Brod  [facility];  Laterality: Left;  FAB  . PACEMAKER IMPLANT N/A 06/03/2020   Procedure: PACEMAKER IMPLANT;  Surgeon: CamniConstance Haw  [facility];  Service: Cardiovascular;  Laterality: N/A;  . TONSILLECTOMY      No Known Allergies  Allergies as of 10/18/2020   No Known Allergies     Medication List       Accurate as of October 18, 2020  3:48 PM. If you have any questions, ask your nurse or doctor.        STOP taking  these medications   hydrochlorothiazide 12.5 MG tablet Commonly known as: HYDRODIURIL Stopped by: Aviella Disbrow X Kylan Liberati, NP     TAKE these medications   apixaban 2.5 MG Tabs tablet Commonly known as: ELIQUIS Take 1 tablet (2.5 mg total) by mouth 2 (two) times daily.   atorvastatin 10 MG tablet Commonly known as: LIPITOR Take 10 mg by mouth every  evening.   diltiazem 120 MG tablet Commonly known as: CARDIZEM Take 120 mg by mouth daily.   furosemide 20 MG tablet Commonly known as: LASIX Take 20 mg by mouth daily.   HYDROcodone-acetaminophen 5-325 MG tablet Commonly known as: NORCO/VICODIN Take 1 tablet by mouth in the morning and at bedtime.   HYDROcodone-acetaminophen 5-325 MG tablet Commonly known as: NORCO/VICODIN Take 1 tablet by mouth twice daily; 1 tablet by mouth every 6 hours as needed for break through pain   ICAPS AREDS 2 PO Take 2 tablets by mouth 2 (two) times daily.   lactose free nutrition Liqd Take 237 mLs by mouth 2 (two) times daily between meals.   Melatonin 10 MG Caps Take 5 mg by mouth at bedtime.   mirtazapine 7.5 MG tablet Commonly known as: REMERON Take 7.5 mg by mouth at bedtime.   omeprazole 20 MG capsule Commonly known as: PRILOSEC Take 20 mg by mouth daily with breakfast.   polyethylene glycol 17 g packet Commonly known as: MIRALAX / GLYCOLAX Take 17 g by mouth daily.   potassium chloride SA 20 MEQ tablet Commonly known as: KLOR-CON Take 20 mEq by mouth daily.   predniSONE 2 MG Tbec Take 2 mg by mouth daily.   Senna-Plus 8.6-50 MG tablet Generic drug: senna-docusate Take 1 tablet by mouth daily.   tamsulosin 0.4 MG Caps capsule Commonly known as: FLOMAX Take 0.4 mg by mouth daily.   timolol 0.5 % ophthalmic solution Commonly known as: TIMOPTIC Place 1 drop into both eyes daily.   zinc oxide 20 % ointment Apply 1 application topically as needed for irritation.       Review of Systems  Constitutional: Negative for activity change, fever and unexpected weight change.       #& Ibs weight gained in 2 weeks.   HENT: Positive for hearing loss. Negative for congestion and voice change.        Hearing aids.   Eyes: Negative for visual disturbance.  Respiratory: Negative for cough and shortness of breath.   Cardiovascular: Positive for leg swelling. Negative for chest pain  and palpitations.  Gastrointestinal: Negative for abdominal pain and constipation.       R+L inguinal hernia.   Genitourinary: Positive for frequency. Negative for dysuria.       2-3 x/night, urinary leakage uses adult brief or condom cath  Musculoskeletal: Positive for arthralgias, back pain and gait problem.       Left sciatica pain   Skin: Positive for color change.       Bruise left shoulder, arm, chest  Neurological: Positive for numbness. Negative for speech difficulty, weakness and headaches.       Feels numb in fingers, but able to make fist  Psychiatric/Behavioral: Negative for confusion and sleep disturbance. The patient is not nervous/anxious.     Immunization History  Administered Date(s) Administered  . Influenza, High Dose Seasonal PF 08/07/2016  . Influenza-Unspecified 08/04/2020  . Moderna SARS-COV2 Booster Vaccination 09/09/2020  . Moderna SARS-COVID-2 Vaccination 12/15/2019  . Pneumococcal-Unspecified 09/13/2020   Pertinent  Health Maintenance Due  Topic Date Due  . PNA  vac Low Risk Adult (2 of 2 - PCV13) 09/13/2021  . INFLUENZA VACCINE  Completed   No flowsheet data found. Functional Status Survey:    Vitals:   10/18/20 1204  BP: 123/70  Pulse: 79  Resp: 19  Temp: 97.8 F (36.6 C)  SpO2: 94%  Weight: 160 lb (72.6 kg)  Height: '5\' 6"'  (1.676 m)   Body mass index is 25.82 kg/m. Physical Exam Vitals and nursing note reviewed.  Constitutional:      Appearance: Normal appearance.  HENT:     Mouth/Throat:     Mouth: Mucous membranes are moist.  Eyes:     Extraocular Movements: Extraocular movements intact.     Conjunctiva/sclera: Conjunctivae normal.     Pupils: Pupils are equal, round, and reactive to light.  Cardiovascular:     Rate and Rhythm: Normal rate and regular rhythm.     Comments: Pacemaker.  Pulmonary:     Effort: Pulmonary effort is normal.     Breath sounds: No rales.  Abdominal:     General: Bowel sounds are normal.      Palpations: Abdomen is soft.     Tenderness: There is no abdominal tenderness.     Hernia: A hernia is present.     Comments: R+L hermina  Musculoskeletal:        General: Tenderness present.     Cervical back: Normal range of motion and neck supple.     Right lower leg: Edema present.     Left lower leg: Edema present.     Comments:  Edema BLE 1+, new since admitted to SNF, aches/heaviness reported, negative Homans sign, weak DP pulses.   Skin:    General: Skin is warm and dry.     Findings: Bruising present.     Comments: Left upper chest bruise is resolving.   Neurological:     General: No focal deficit present.     Mental Status: He is alert and oriented to person, place, and time. Mental status is at baseline.     Motor: No weakness.     Coordination: Coordination normal.     Gait: Gait abnormal.  Psychiatric:        Mood and Affect: Mood normal.        Behavior: Behavior normal.        Thought Content: Thought content normal.        Judgment: Judgment normal.     Labs reviewed: Recent Labs    09/06/20 0145 09/06/20 0145 09/08/20 0322 09/08/20 0322 09/09/20 0351 09/13/20 0000 09/20/20 0000  NA 137   < > 133*   < > 135 137 137  K 4.0   < > 3.6   < > 3.6 3.8 4.3  CL 104   < > 101   < > 101 101 102  CO2 23   < > 24   < > 25 28* 30*  GLUCOSE 95  --  108*  --  96  --   --   BUN 19   < > 29*   < > 23 26* 20  CREATININE 1.19   < > 1.53*   < > 1.25* 1.1 1.2  CALCIUM 8.5*   < > 8.5*   < > 8.7* 8.8 8.5*   < > = values in this interval not displayed.   Recent Labs    09/20/20 0000  AST 19  ALT 29  ALKPHOS 183*  ALBUMIN 2.9*   Recent Labs  09/05/20 5449 09/05/20 0913 09/06/20 0145 09/06/20 0145 09/08/20 0322 09/08/20 0322 09/09/20 0351 09/13/20 0000 09/20/20 0000  WBC 11.3*   < > 9.1   < > 10.9*   < > 10.1 9.5 6.9  NEUTROABS 8.2*  --   --   --   --   --   --   --  3,912.00  HGB 12.3*   < > 10.9*   < > 9.5*   < > 10.0* 10.6* 9.8*  HCT 38.2*   < > 33.3*    < > 29.5*   < > 30.3* 32* 30*  MCV 92.9   < > 92.5  --  92.5  --  92.7  --   --   PLT 185   < > 167   < > 138*   < > 180 286 305   < > = values in this interval not displayed.   No results found for: TSH No results found for: HGBA1C Lab Results  Component Value Date   CHOL 137 09/27/2020   HDL 44 09/27/2020   LDLCALC 75 09/27/2020   TRIG 92 09/27/2020    Significant Diagnostic Results in last 30 days:  No results found.  Assessment/Plan Closed traumatic minimally displaced fracture of shaft of right clavicle Hospitalized 09/05/20-09/09/20 for a mechanical fall at home, resulted R clavicle/placed on sling and right pelvis ramus fx/WBAT. Improving, working with therapy, prn Norco for pain.    Tachy-brady syndrome ([place]) Pacemaker 07/04/18,  f/u [name], interrogation for PM issues.    Atrial fibrillation, chronic (HCC) Fib, Eliquis, CHA2DS2-VAS score >3, f/u Dr. [name]   Essential hypertension HTN, takes Diltiazem, HCTZ, Bun/creat 20/1.2 09/20/20   Anemia Anemia, mild, Hgb 10 09/09/20, 10.6 09/13/20, 9.8 09/20/20  CKD (chronic kidney disease) stage 3, GFR 30-59 ml/min (HCC) CKD Bun/creat 23/1.2 10/11/20   Hyponatremia Hyponatremia, Na 141 10/11/20   Urinary frequency Urinary frequency, takes Tamsulosin 0.4m qd   Sciatica, left side Prednisone 236mqd for chronic left sciatica pain.  GERD (gastroesophageal reflux disease) GERD, takes Omeprazole    Insomnia secondary to depression with anxiety Depression/insmonia, takes Mirtazapine 7.27m60mhs   Hyperlipidemia Hyperlipidemia, takes Atorvastatin 20m49m, LDL 75 09/27/20   Slow transit constipation Constipation, takes Colace, Senna, MiraLax.   Peripheral edema Edema BLE, new since admitted to SNF, aches/heaviness reported, negative Homans sign, weak DP pulses. Denied cough, SOB, chest pain/pressure, palpitation. Dc'd HCTZ, started Furosemide 20mg1027m Kcl 20meq28m not adequate, will increase Furosemide  40mg/K82m0meq q14mpdate CMP/eGFR in one week. No echocardiogram found in Epic, will monitor for s/s of developing CHF. May have edema in legs in the past since venous US LLE [place] 06/02/2008.      Family/ staff Communication: plan of care reviewed with the patient and charge nurse.   Labs/tests ordered:  CMP/eGFR one week.   Time spend 35 minutes.

## 2020-10-18 NOTE — Assessment & Plan Note (Signed)
CKD Bun/creat 23/1.2 10/11/20

## 2020-10-18 NOTE — Assessment & Plan Note (Signed)
Hyponatremia, Na 141 10/11/20

## 2020-10-18 NOTE — Assessment & Plan Note (Signed)
Depression/insmonia, takes Mirtazapine 7.5mg qhs 

## 2020-10-18 NOTE — Assessment & Plan Note (Signed)
GERD, takes Omeprazole  

## 2020-10-18 NOTE — Assessment & Plan Note (Signed)
Pacemaker 07/04/18,  f/u [name], interrogation for PM issues.

## 2020-10-18 NOTE — Assessment & Plan Note (Signed)
Urinary frequency, takes Tamsulosin 0.4mg  qd

## 2020-10-18 NOTE — Assessment & Plan Note (Signed)
Fib, Eliquis, CHA2DS2-VAS score >3, f/u Dr. [name]

## 2020-10-18 NOTE — Assessment & Plan Note (Signed)
Hyperlipidemia, takes Atorvastatin 10mg  qd, LDL 75 09/27/20

## 2020-10-18 NOTE — Assessment & Plan Note (Signed)
Edema BLE, new since admitted to SNF, aches/heaviness reported, negative Homans sign, weak DP pulses. Denied cough, SOB, chest pain/pressure, palpitation. Dc'd HCTZ, started Furosemide 1m qd, Kcl 241m qd, not adequate, will increase Furosemide 4016mcl 60m43md, update CMP/eGFR in one week. No echocardiogram found in Epic, will monitor for s/s of developing CHF. May have edema in legs in the past since venous US [place] found 06/02/2008.

## 2020-10-18 NOTE — Assessment & Plan Note (Signed)
Anemia, mild, Hgb 10 09/09/20, 10.6 09/13/20, 9.8 09/20/20

## 2020-10-18 NOTE — Assessment & Plan Note (Signed)
Hospitalized 09/05/20-09/09/20 for a mechanical fall at home, resulted R clavicle/placed on sling and right pelvis ramus fx/WBAT. Improving, working with therapy, prn Norco for pain.

## 2020-10-21 ENCOUNTER — Encounter: Payer: Self-pay | Admitting: Internal Medicine

## 2020-10-21 ENCOUNTER — Non-Acute Institutional Stay (SKILLED_NURSING_FACILITY): Payer: Medicare Other | Admitting: Internal Medicine

## 2020-10-21 DIAGNOSIS — D649 Anemia, unspecified: Secondary | ICD-10-CM

## 2020-10-21 DIAGNOSIS — R21 Rash and other nonspecific skin eruption: Secondary | ICD-10-CM | POA: Diagnosis not present

## 2020-10-21 DIAGNOSIS — I482 Chronic atrial fibrillation, unspecified: Secondary | ICD-10-CM

## 2020-10-21 DIAGNOSIS — I495 Sick sinus syndrome: Secondary | ICD-10-CM

## 2020-10-21 DIAGNOSIS — L299 Pruritus, unspecified: Secondary | ICD-10-CM | POA: Diagnosis not present

## 2020-10-21 DIAGNOSIS — I10 Essential (primary) hypertension: Secondary | ICD-10-CM

## 2020-10-21 DIAGNOSIS — R600 Localized edema: Secondary | ICD-10-CM

## 2020-10-21 DIAGNOSIS — N1831 Chronic kidney disease, stage 3a: Secondary | ICD-10-CM

## 2020-10-21 NOTE — Progress Notes (Deleted)
Location:  [facility] Room Number: 32-A Place of Service:  SNF [phone]) Provider:  Virgie Dad, MD  Patient Care Team: Virgie Dad, MD as PCP - General (Internal Medicine)  Extended Emergency Contact Information Primary Emergency Contact: Michelle Nasuti Address: [address]          [place], [place] [address] [name] Phone: [phone] Work Phone: [phone] Mobile Phone: [phone] Relation: Son  Code Status:  DNR Goals of care: Advanced Directive information Advanced Directives 10/18/2020  Does Patient Have a Medical Advance Directive? Yes  Type of Advance Directive Out of facility DNR (pink MOST or yellow form);Living will;Healthcare Power of Attorney  Does patient want to make changes to medical advance directive? No - Patient declined  Copy of [place] in Chart? Yes - validated most recent copy scanned in chart (See row information)  Would patient like information on creating a medical advance directive? -  Pre-existing out of facility DNR order (yellow form or pink MOST form) Yellow form placed in chart (order not valid for inpatient use)     Chief Complaint  Patient presents with  . Acute Visit    Patient is seen for a rash    HPI:  Pt is a 86 y.o. [place] seen today for an acute visit for    Past Medical History:  Diagnosis Date  . Ankle edema   . Anxiety   . Dysrhythmia    a-fib/flutter- on eliquis  . GERD (gastroesophageal reflux disease)    no treatment  . Hard of hearing   . Heart block AV complete ([place]) 07/31/2020  . Hypertension   . Kidney stone   . Presence of permanent cardiac pacemaker 06/03/2020  . Spinal stenosis   . Tachy-brady syndrome ([place]) 06/03/2020   Past Surgical History:  Procedure Laterality Date  . addnoid    . CYSTOSCOPY W/ URETEROSCOPY  1980  . INSERT / REPLACE / REMOVE PACEMAKER  06/03/2020  . KNEE SURGERY    . NAILBED REPAIR Left 06/27/2016   Procedure:  NAILBED biopsy left thumb;  Surgeon: Daryll Brod, MD;  Location: [facility];  Service: Orthopedics;  Laterality: Left;  FAB  . PACEMAKER IMPLANT N/A 06/03/2020   Procedure: PACEMAKER IMPLANT;  Surgeon: Constance Haw, MD;  Location: [facility];  Service: Cardiovascular;  Laterality: N/A;  . TONSILLECTOMY      No Known Allergies  Outpatient Encounter Medications as of 10/21/2020  Medication Sig  . apixaban (ELIQUIS) 2.5 MG TABS tablet Take 1 tablet (2.5 mg total) by mouth 2 (two) times daily.  Marland Kitchen atorvastatin (LIPITOR) 10 MG tablet Take 10 mg by mouth every evening.   . diltiazem (CARDIZEM) 120 MG tablet Take 120 mg by mouth daily.  . furosemide (LASIX) 20 MG tablet Take 20 mg by mouth daily.  Marland Kitchen HYDROcodone-acetaminophen (NORCO/VICODIN) 5-325 MG tablet Take 1 tablet by mouth twice daily; 1 tablet by mouth every 6 hours as needed for break through pain  . lactose free nutrition (BOOST) LIQD Take 237 mLs by mouth 2 (two) times daily between meals.  . Melatonin 10 MG CAPS Take 5 mg by mouth at bedtime.   . mirtazapine (REMERON) 7.5 MG tablet Take 7.5 mg by mouth at bedtime.   . Multiple Vitamins-Minerals (ICAPS AREDS 2 PO) Take 2 tablets by mouth 2 (two) times daily.  Marland Kitchen omeprazole (PRILOSEC) 20 MG capsule Take 20 mg by mouth daily with breakfast.   .  polyethylene glycol (MIRALAX / GLYCOLAX) 17 g packet Take 17 g by mouth daily.  . potassium chloride SA (KLOR-CON) 20 MEQ tablet Take 20 mEq by mouth daily.  . predniSONE 2 MG TBEC Take 2 mg by mouth daily.  Marland Kitchen senna-docusate (SENOKOT-S) 8.6-50 MG tablet Take 1 tablet by mouth daily.  . tamsulosin (FLOMAX) 0.4 MG CAPS capsule Take 0.4 mg by mouth daily.  . timolol (TIMOPTIC) 0.5 % ophthalmic solution Place 1 drop into both eyes daily.  Marland Kitchen zinc oxide 20 % ointment Apply 1 application topically as needed for irritation.  . [DISCONTINUED] HYDROcodone-acetaminophen (NORCO/VICODIN) 5-325 MG tablet Take 1 tablet by mouth in the  morning and at bedtime.   No facility-administered encounter medications on file as of 10/21/2020.    Review of Systems  Immunization History  Administered Date(s) Administered  . Influenza, High Dose Seasonal PF 08/07/2016  . Influenza-Unspecified 08/04/2020  . Moderna SARS-COV2 Booster Vaccination 10/[place]/2021  . Moderna SARS-COVID-2 Vaccination 12/15/2019  . Pneumococcal-Unspecified 09/13/2020   Pertinent  Health Maintenance Due  Topic Date Due  . PNA vac Low Risk Adult (2 of 2 - PCV13) 09/13/2021  . INFLUENZA VACCINE  Completed   No flowsheet data found. Functional Status Survey:    Vitals:   10/21/20 1428  BP: 106/62  Pulse: 66  Resp: 19  Temp: (!) 97 F (36.1 C)  TempSrc: Oral  Weight: 160 lb (72.6 kg)  Height: 5\' 6"  (1.676 m)   Body mass index is 25.82 kg/m. Physical Exam  Labs reviewed: Recent Labs    09/06/20 0145 09/08/20 0322 10/Stephen/21 0351 09/13/20 0000 09/20/20 0000  NA 137 133* 135 137 137  K 4.0 3.6 3.6 3.8 4.3  CL 104 101 101 101 102  CO2 23 24 25  Stephen* 30*  GLUCOSE 95 108* 96  --   --   BUN 19 29* 23 26* 20  CREATININE 1.19 1.53* 1.25* 1.1 1.2  CALCIUM 8.5* 8.5* 8.7* 8.8 8.5*   Recent Labs    09/20/20 0000  AST 19  ALT 29  ALKPHOS 183*  ALBUMIN 2.9*   Recent Labs    09/05/20 0907 09/05/20 0913 09/06/20 0145 09/08/20 0322 10/Stephen/21 0351 09/13/20 0000 09/20/20 0000  WBC 11.3*  --  9.1 10.9* 10.1 9.5 6.9  NEUTROABS 8.2*  --   --   --   --   --  3,912.00  HGB 12.3*   < > 10.9* 9.5* 10.0* 10.6* 9.8*  HCT 38.2*   < > 33.3* 29.5* 30.3* 32* 30*  MCV 92.9  --  92.5 92.5 92.7  --   --   PLT 185  --  167 138* 180 286 305   < > = values in this interval not displayed.   No results found for: TSH No results found for: HGBA1C Lab Results  Component Value Date   CHOL 137 09/27/2020   HDL 44 09/27/2020   LDLCALC 75 09/27/2020   TRIG 92 09/27/2020    Significant Diagnostic Results in last 30 days:  No results  found.  Assessment/Plan There are no diagnoses linked to this encounter.   Family/ staff Communication: ***  Labs/tests ordered:  ***

## 2020-10-21 NOTE — Progress Notes (Signed)
Location: [place] Room Number: 32-A Place of Service:  SNF (31)  Provider:   Code Status:  Goals of Care:  Advanced Directives 10/18/2020  Does Patient Have a Medical Advance Directive? Yes  Type of Advance Directive Out of facility DNR (pink MOST or yellow form);Living will;Healthcare Power of Attorney  Does patient want to make changes to medical advance directive? No - Patient declined  Copy of [place] in Chart? Yes - validated most recent copy scanned in chart (See row information)  Would patient like information on creating a medical advance directive? -  Pre-existing out of facility DNR order (yellow form or pink MOST form) Yellow form placed in chart (order not valid for inpatient use)     Chief Complaint  Patient presents with   Acute Visit    Patient is seen for a rash    HPI: Patient is a 86 y.o. male seen today for an acute visit for Rash in [facility] of his Thighs and itching all over his body  Patient was admitted in the hospital from 10/24-10/28 after mechanical fall in his apartment With the right clavicle and right pubic ramus fracture. Patient has a history of A. fib on Eliquis, hypertension, hyperlipidemia, s/p pacemaker placed  He was c/o Itching all over his body and has rash in lower Inner thigh region No Rash in any other place just itching Patient has been on [place] for his pain Also was recently started on Lasix for LE edema No Other new Meds  Also c/o Insomnia  Otherwise doing well. Walking well with his walker and therapy.   Past Medical History:  Diagnosis Date   Ankle edema    Anxiety    Dysrhythmia    a-fib/flutter- on eliquis   GERD (gastroesophageal reflux disease)    no treatment   Hard of hearing    Heart block AV complete ([place]) 07/31/2020   Hypertension    Kidney stone    Presence of permanent cardiac pacemaker 06/03/2020   Spinal stenosis    Tachy-brady syndrome ([place])  06/03/2020    Past Surgical History:  Procedure Laterality Date   addnoid     CYSTOSCOPY W/ URETEROSCOPY  1980   INSERT / REPLACE / REMOVE PACEMAKER  06/03/2020   KNEE SURGERY     NAILBED REPAIR Left 06/27/2016   Procedure: NAILBED biopsy left thumb;  Surgeon: Daryll Brod, MD;  Location: [place];  Service: Orthopedics;  Laterality: Left;  FAB   PACEMAKER IMPLANT N/A 06/03/2020   Procedure: PACEMAKER IMPLANT;  Surgeon: Constance Haw, MD;  Location: [place] CV LAB;  Service: Cardiovascular;  Laterality: N/A;   TONSILLECTOMY      No Known Allergies  Outpatient Encounter Medications as of 10/21/2020  Medication Sig   apixaban (ELIQUIS) 2.5 MG TABS tablet Take 1 tablet (2.5 mg total) by mouth 2 (two) times daily.   atorvastatin (LIPITOR) 10 MG tablet Take 10 mg by mouth every evening.    diltiazem (CARDIZEM) 120 MG tablet Take 120 mg by mouth daily.   furosemide (LASIX) 20 MG tablet Take 20 mg by mouth daily.   HYDROcodone-acetaminophen (NORCO/VICODIN) 5-325 MG tablet Take 1 tablet by mouth twice daily; 1 tablet by mouth every 6 hours as needed for break through pain   lactose free nutrition (BOOST) LIQD Take 237 mLs by mouth 2 (two) times daily between meals.   Melatonin 10 MG CAPS Take 5 mg by mouth at bedtime.  mirtazapine (REMERON) 7.5 MG tablet Take 7.5 mg by mouth at bedtime.    Multiple Vitamins-Minerals (ICAPS AREDS 2 PO) Take 2 tablets by mouth 2 (two) times daily.   omeprazole (PRILOSEC) 20 MG capsule Take 20 mg by mouth daily with breakfast.    polyethylene glycol (MIRALAX / GLYCOLAX) 17 g packet Take 17 g by mouth daily.   potassium chloride SA (KLOR-CON) 20 MEQ tablet Take 20 mEq by mouth daily.   predniSONE 2 MG TBEC Take 2 mg by mouth daily.   senna-docusate (SENOKOT-S) 8.6-50 MG tablet Take 1 tablet by mouth daily.   tamsulosin (FLOMAX) 0.4 MG CAPS capsule Take 0.4 mg by mouth daily.   timolol (TIMOPTIC) 0.5 % ophthalmic  solution Place 1 drop into both eyes daily.   zinc oxide 20 % ointment Apply 1 application topically as needed for irritation.   [DISCONTINUED] HYDROcodone-acetaminophen (NORCO/VICODIN) 5-325 MG tablet Take 1 tablet by mouth in the morning and at bedtime.   No facility-administered encounter medications on file as of 10/21/2020.    Review of Systems:  Review of Systems  Constitutional: Negative.   HENT: Negative.   Respiratory: Negative.   Cardiovascular: Positive for leg swelling.  Gastrointestinal: Negative.   Genitourinary: Negative.   Musculoskeletal: Positive for gait problem.  Skin: Positive for rash.  Neurological: Negative for dizziness.  Psychiatric/Behavioral: Positive for sleep disturbance.    Health Maintenance  Topic Date Due   TETANUS/TDAP  Never done   COVID-19 Vaccine (3 - Booster for Moderna series) 03/10/2021   PNA vac Low Risk Adult (2 of 2 - PCV13) 09/13/2021   INFLUENZA VACCINE  Completed    Physical Exam: Vitals:   10/21/20 1428  BP: 106/62  Pulse: 66  Resp: 19  Temp: (!) 97 F (36.1 C)  TempSrc: Oral  Weight: 160 lb (72.6 kg)  Height: 5\' 6"  (1.676 m)   Body mass index is 25.82 kg/m. Physical Exam Vitals reviewed.  Constitutional:      Appearance: Normal appearance.  HENT:     Head: Normocephalic.     Nose: Nose normal.     Mouth/Throat:     Mouth: Mucous membranes are moist.     Pharynx: Oropharynx is clear.  Eyes:     Pupils: Pupils are equal, round, and reactive to light.  Cardiovascular:     Rate and Rhythm: Normal rate and regular rhythm.     Pulses: Normal pulses.     Heart sounds: Normal heart sounds.  Pulmonary:     Effort: Pulmonary effort is normal.     Breath sounds: Normal breath sounds.  Abdominal:     General: Abdomen is flat. Bowel sounds are normal.     Palpations: Abdomen is soft.  Musculoskeletal:        General: Swelling present.     Cervical back: Neck supple.  Skin:    General: Skin is warm.      Comments: Has Red Maculopapular rash in inner thigh lower near his Knees. No Rash noticed in Back or Arms  Neurological:     General: No focal deficit present.     Mental Status: He is alert.  Psychiatric:        Mood and Affect: Mood normal.        Thought Content: Thought content normal.     Labs reviewed: Basic Metabolic Panel: Recent Labs    09/06/20 0145 09/08/20 0322 09/09/20 0351 09/13/20 0000 09/20/20 0000  NA 137 133* 135 137 137  K 4.0  3.6 3.6 3.8 4.3  CL 104 101 101 101 102  CO2 23 24 25  28* 30*  GLUCOSE 95 108* 96  --   --   BUN 19 29* 23 26* 20  CREATININE 1.19 1.53* 1.25* 1.1 1.2  CALCIUM 8.5* 8.5* 8.7* 8.8 8.5*   Liver Function Tests: Recent Labs    09/20/20 0000  AST 19  ALT 29  ALKPHOS 183*  ALBUMIN 2.9*   No results for input(s): LIPASE, AMYLASE in the last 8760 hours. No results for input(s): AMMONIA in the last 8760 hours. CBC: Recent Labs    09/05/20 0907 09/05/20 0913 09/06/20 0145 09/08/20 0322 09/09/20 0351 09/13/20 0000 09/20/20 0000  WBC 11.3*  --  9.1 10.9* 10.1 9.5 6.9  NEUTROABS 8.2*  --   --   --   --   --  3,912.00  HGB 12.3*   < > 10.9* 9.5* 10.0* 10.6* 9.8*  HCT 38.2*   < > 33.3* 29.5* 30.3* 32* 30*  MCV 92.9  --  92.5 92.5 92.7  --   --   PLT 185  --  167 138* 180 286 305   < > = values in this interval not displayed.   Lipid Panel: Recent Labs    09/27/20 0000  CHOL 137  HDL 44  LDLCALC 75  TRIG 92   No results found for: HGBA1C  Procedures since last visit: No results found.  Assessment/Plan Rash and Itching ? Due to Norco Discontinue Norco Use Triamcinolone to back and Arms Also Use Nystatin in inner thigh  LE edema Bilateral Echo from 4/ 2021 showed Normal EF but Bilateral Atrial dilatation with MR and TR On lasix now Edema controlled BUN and Creat stab;e S/P Pubic Ramus Fracture and minimally displaced fracture of shaft of right clavicle Doing well with Walking Will control pain with  Tylenol   Pacemaker [employer] MRI Dual Chamber pacemaker 06/03/2018 Follows with Dr Shonna Chock  Atrial fibrillation, chronic (HCC) ON Cardizem and Eliquis  Essential hypertension Continue Cardizem HCTZ discontinued  On Lasix  Anemia, unspecified type Will Repeat CBC and Iron Studies   Stage 3a chronic kidney disease (HCC) BUN and Creat stable on Lasix  Hyponatremia Stable  Insomnia secondary to depression with anxiety Melatonin QD scheduled at 8 pm  Slow transit constipation  on Senna with Miralax Sciatica, left side Was told by his PCP to stay on Prednisone  Gastroesophageal reflux disease, unspecified whether esophagitis present Prilosec Poor appetite Continue Remeron  Helps his Insomnia also Consider changing dose if no improvement Hyperlipidemia On Lipitor     Labs/tests ordered:  * No order type specified * Next appt:  Visit date not found

## 2020-10-28 ENCOUNTER — Ambulatory Visit: Payer: Medicare Other | Admitting: Cardiology

## 2020-10-28 ENCOUNTER — Other Ambulatory Visit: Payer: Self-pay

## 2020-10-28 ENCOUNTER — Encounter: Payer: Self-pay | Admitting: Cardiology

## 2020-10-28 VITALS — BP 116/73 | HR 81 | Resp 16 | Ht 66.0 in | Wt 160.0 lb

## 2020-10-28 DIAGNOSIS — I495 Sick sinus syndrome: Secondary | ICD-10-CM

## 2020-10-28 DIAGNOSIS — I480 Paroxysmal atrial fibrillation: Secondary | ICD-10-CM

## 2020-10-28 DIAGNOSIS — Z45018 Encounter for adjustment and management of other part of cardiac pacemaker: Secondary | ICD-10-CM

## 2020-10-28 DIAGNOSIS — Z950 Presence of cardiac pacemaker: Secondary | ICD-10-CM

## 2020-10-28 LAB — CBC: RBC: 3.2 — AB (ref 3.87–5.11)

## 2020-10-28 LAB — COMPREHENSIVE METABOLIC PANEL
Albumin: 3.1 — AB (ref 3.5–5.0)
Calcium: 8.2 — AB (ref 8.7–10.7)
Globulin: 1.9

## 2020-10-28 LAB — CBC AND DIFFERENTIAL
HCT: 29 — AB (ref 41–53)
Hemoglobin: 9.9 — AB (ref 13.5–17.5)
Neutrophils Absolute: 3649
Platelets: 157 (ref 150–399)
WBC: 7.1

## 2020-10-28 LAB — BASIC METABOLIC PANEL
BUN: 21 (ref 4–21)
CO2: 26 — AB (ref 13–22)
Chloride: 108 (ref 99–108)
Creatinine: 1.1 (ref 0.6–1.3)
Glucose: 78
Potassium: 3.9 (ref 3.4–5.3)
Sodium: 140 (ref 137–147)

## 2020-10-28 LAB — HEPATIC FUNCTION PANEL
ALT: 13 (ref 10–40)
AST: 15 (ref 14–40)
Alkaline Phosphatase: 104 (ref 25–125)
Bilirubin, Total: 0.4

## 2020-10-28 MED ORDER — AMIODARONE HCL 200 MG PO TABS: 200.0000 mg | ORAL_TABLET | Freq: Two times a day (BID) | ORAL | 1 refills | Status: DC

## 2020-10-28 NOTE — Progress Notes (Signed)
Primary Physician/Referring:  Virgie Dad, MD  Patient ID: Stephen Cuevas, male    DOB: April 26, 1926, 86 y.o.   MRN: [id number]  Chief Complaint  Patient presents with  . Pacemaker Check  . Atrial Fibrillation  . Follow-up    3 month   HPI:    Stephen Cuevas  is a 86 y.o. Caucasian male who resides at [facility] assisted living facility and has history of hyperlipidemia, hypertension, sick sinus syndrome with paroxysmal atrial fibrillation, complete heart block who underwent permanent pacemaker implantation on 06/04/2020 for marked dyspnea, fatigue and dizziness. He is accompanied by son.  Patient had a fall and admitted on 09/05/2020 to the hospital after an accidental fall and had right clavicle fracture and pubic ramus fracture, recommended physical therapy and also stabilization with WBAT.  He is doing well with regard to rehab however over the past 6 to 8 weeks since the fall he has noticed his energy level is markedly reduced and gets short of breath easily.  He also has mild bilateral leg edema.  Past Medical History:  Diagnosis Date  . Ankle edema   . Anxiety   . Dysrhythmia    a-fib/flutter- on eliquis  . GERD (gastroesophageal reflux disease)    no treatment  . Hard of hearing   . Heart block AV complete ([place]) 07/31/2020  . Hypertension   . Kidney stone   . Presence of permanent cardiac pacemaker 06/03/2020  . Spinal stenosis   . Tachy-brady syndrome ([place]) 06/03/2020   Past Surgical History:  Procedure Laterality Date  . addnoid    . CYSTOSCOPY W/ URETEROSCOPY  1980  . INSERT / REPLACE / REMOVE PACEMAKER  06/03/2020  . KNEE SURGERY    . NAILBED REPAIR Left 06/27/2016   Procedure: NAILBED biopsy left thumb;  Surgeon: Daryll Brod, MD;  Location: [place];  Service: Orthopedics;  Laterality: Left;  FAB  . PACEMAKER IMPLANT N/A 06/03/2020   Procedure: PACEMAKER IMPLANT;  Surgeon: Constance Haw, MD;  Location: [place] CV LAB;   Service: Cardiovascular;  Laterality: N/A;  . TONSILLECTOMY     Family History  Problem Relation Age of Onset  . Heart failure Father   . Heart disease Father     Social History   Tobacco Use  . Smoking status: Former Smoker    Packs/day: 1.50    Years: 20.00    Pack years: 30.00    Types: Cigarettes, Pipe, Cigars    Quit date: 03/13/1956    Years since quitting: 64.6  . Smokeless tobacco: Never Used  Substance Use Topics  . Alcohol use: Yes    Comment: daily   Marital Status: Widowed ROS  Review of Systems  Constitutional: Negative for malaise/fatigue.  Cardiovascular: Positive for leg swelling (mild, bilateral). Negative for chest pain, dyspnea on exertion, near-syncope and orthopnea.  Musculoskeletal: Positive for arthritis and joint pain (bilateral hip and right arm since fall).  Gastrointestinal: Negative for melena.  Neurological: Positive for dizziness (rare).   Objective  Blood pressure 116/73, pulse 81, resp. rate 16, height '5\' 6"'  (1.676 m), weight 160 lb (72.6 kg), SpO2 97 %.  Vitals with BMI 10/28/2020 10/21/2020 10/18/2020  Height '5\' 6"'  '5\' 6"'  '5\' 6"'   Weight 160 lbs 160 lbs 160 lbs  BMI 25.84 95.18 84.16  Systolic 606 301 601  Diastolic 73 62 70  Pulse 81 66 79     Physical Exam HENT:     Head: Atraumatic.  Cardiovascular:     Rate and Rhythm: Normal rate. Rhythm irregular.     Pulses: Intact distal pulses.          Carotid pulses are 2+ on the right side and 2+ on the left side.      Popliteal pulses are 2+ on the right side and 2+ on the left side.       Dorsalis pedis pulses are 2+ on the right side and 2+ on the left side.       Posterior tibial pulses are 1+ on the right side and 1+ on the left side.     Heart sounds: S1 normal and S2 normal. Murmur heard.   Early systolic murmur is present with a grade of 2/6 at the upper right sternal border. No gallop.      Comments: No JVD.  1-2+ bilateral pitting leg edema Pulmonary:     Effort: Pulmonary  effort is normal.     Breath sounds: Normal breath sounds.     Comments: Pacemaker/ICD site noted  in the left infraclavicular fossa.   Abdominal:     General: Bowel sounds are normal.     Palpations: Abdomen is soft.  Musculoskeletal:        General: Signs of injury present.  Skin:    General: Skin is warm and dry.     Capillary Refill: Capillary refill takes less than 2 seconds.  Neurological:     General: No focal deficit present.     Mental Status: He is oriented to person, place, and time.    Laboratory examination:   CMP Latest Ref Rng & Units 09/20/2020 09/13/2020 09/09/2020  Glucose 70 - 99 mg/dL - - 96  BUN 4 - 21 20 26(A) 23  Creatinine 0.6 - 1.3 1.2 1.1 1.25(H)  Sodium 137 - 147 137 137 135  Potassium 3.4 - 5.3 4.3 3.8 3.6  Chloride 99 - 108 102 101 101  CO2 13 - 22 30(A) 28(A) 25  Calcium 8.7 - 10.7 8.5(A) 8.8 8.7(L)  Total Protein 6.0 - 8.3 g/dL - - -  Total Bilirubin 0.3 - 1.2 mg/dL - - -  Alkaline Phos 25 - 125 183(A) - -  AST 14 - 40 19 - -  ALT 10 - 40 29 - -   CBC Latest Ref Rng & Units 09/20/2020 09/13/2020 09/09/2020  WBC - 6.9 9.5 10.1  Hemoglobin 13.5 - 17.5 9.8(A) 10.6(A) 10.0(L)  Hematocrit 41 - 53 30(A) 32(A) 30.3(L)  Platelets 150 - 399 305 286 180   External labs:  02/16/2020: Serum glucose 105 mg, BUN 22, creatinine 1.7, EGFR 37.9 mL, potassium 4.7. Hb 12.3/HCT 40.9, platelets 190. Total cholesterol 162, triglycerides 70, HDL 63, LDL 85.  Non-HDL cholesterol 99. PSA normal   Medications and allergies  No Known Allergies   Current Outpatient Medications on File Prior to Visit  Medication Sig Dispense Refill  . apixaban (ELIQUIS) 2.5 MG TABS tablet Take 1 tablet (2.5 mg total) by mouth 2 (two) times daily. 180 tablet 3  . atorvastatin (LIPITOR) 10 MG tablet Take 10 mg by mouth every evening.   12  . diltiazem (CARDIZEM) 120 MG tablet Take 120 mg by mouth daily.    Marland Kitchen lactose free nutrition (BOOST) LIQD Take 237 mLs by mouth 2 (two) times  daily between meals.    . Melatonin 10 MG CAPS Take 5 mg by mouth at bedtime.     . mirtazapine (REMERON) 7.5 MG tablet Take 7.5 mg  by mouth at bedtime.     . Multiple Vitamins-Minerals (ICAPS AREDS 2 PO) Take 2 tablets by mouth 2 (two) times daily.    Marland Kitchen omeprazole (PRILOSEC) 20 MG capsule Take 20 mg by mouth daily with breakfast.     . polyethylene glycol (MIRALAX / GLYCOLAX) 17 g packet Take 17 g by mouth daily.    . potassium chloride SA (KLOR-CON) 20 MEQ tablet Take 20 mEq by mouth daily.    . predniSONE 2 MG TBEC Take 2 mg by mouth daily.    Marland Kitchen senna-docusate (SENOKOT-S) 8.6-50 MG tablet Take 1 tablet by mouth daily.    . timolol (TIMOPTIC) 0.5 % ophthalmic solution Place 1 drop into both eyes daily.    Marland Kitchen zinc oxide 20 % ointment Apply 1 application topically as needed for irritation.     No current facility-administered medications on file prior to visit.    Lisinopril on hold since 02/16/19 per PCP  Radiology:   Chest x-ray PA and lateral view 02/16/2020: Mild hyperextension, patchy right basilar infiltrate, borderline cardiomegaly, atherosclerosis, thoracic levoscoliosis and spondylosis.  Cardiac Studies:   Echocardiogram 02/26/2020:  Normal LV systolic function with visual EF 60-65%. Left ventricle cavity is normal in size. Mild left ventricular hypertrophy. Normal global wall  motion. Doppler evidence of grade II diastolic dysfunction. Calculated EF 66%.  Left atrial cavity is severely dilated, 62m/m2.  Right atrial cavity is dilated.  No evidence of aortic stenosis. Mild aortic regurgitation.  Mild to moderate mitral regurgitation.  Moderate tricuspid regurgitation. Moderate pulmonary hypertension. RVSP measures 51 mmHg.  IVC is dilated with a respiratory response of <50%.  No prior study for comparison.  Event monitor 04/27/2020 through 05/17/2020: Patient had paroxysmal episodes of atrial fibrillation with 4% atrial fibrillation burden. Predominant rhythm was sinus rhythm  with nonconducted P waves and 1: 1 Mobitz 2 AV  block at 10:21 PM on 05/10/2020. Patient also had a 3.4-second ventricular standstill at 10:35 PM on 05/04/2020   [place] pac[place] 06/03/2018    Scheduled  In office pacemaker check 10/28/20  Single (S)/Dual (D)/BV: D. Presenting AFVS. Pacemaker dependant:  No. Underlying AF with VR 80-100, irregular. AP 16%, VP 12%.  AMS Episodes 4178.  AT/AF burden 21% . Longest 1D7H. Latest Ongoing since 8AM today. HVR 1. Longest 9Sec, 28 beat NSVT @ 192/min.   Longevity 9.6-11.1 Years. Magnet rate: >85%. Lead measurements: Stable..Marland KitchenHistogram: Low (L)/normal (N)/high (H)  Normal. Patient activity Decreased since Oct 2021.   Observations: Normal pacemaker function.  Frequent episodes of atrial fibrillation, since September 02, 2020, A. fib burden has been ranging from 17 up to 48%. Changes: None.    EKG  EKG 06/02/2020: Underlying sinus bradycardia at rate of 30 bpm with complete heart block and junctional escape, frequent PACs in bigeminal pattern that appeared to be conducted.  Left axis deviation, left anterior fascicular block.  Right bundle branch block.   EKG 03/29/2020: Sinus rhythm with first-degree block at rate of 83 bpm, leftward enlargement, left axis deviation, left intrafascicular block.  Right bundle branch block.  Bifascicular block.  Low-voltage complexes.  Nonspecific T abnormality.  EKG 03/22/2020, PCP EKG.  Sinus tachycardia at rate of 100 bpm with 2: 1 AV conduction, Mobitz 2 AV block 1: 1.  Left axis deviation, left anterior fascicular block.  Right bundle branch block.  02/20/2020: Sinus bradycardia with first-degree AV block at the rate of 52 bpm, left axis deviation, left anterior fascicular block.  Right bundle branch block.  Trifascicular block.  Low-voltage complexes.  No evidence of ischemia.  02/20/2020: Atrial fibrillation with controlled ventricular response at rate of 49 bpm,  left axis deviation, left anterior fascicular block.  Right bundle branch block.  Nonspecific T abnormality.    Assessment     ICD-10-CM   1. Encounter for care of pacemaker  Z45.018   2. Pacemaker [place] MRI Dual Chamber pacemaker 06/03/2018  Z95.0   3. Tachycardia-bradycardia syndrome ([place])  I49.5   4. Paroxysmal atrial fibrillation (HCC)  I48.0 DISCONTINUED: amiodarone (PACERONE) 200 MG tablet    CHA2DS2-VASc Score is 3.  Yearly risk of stroke: 3.2% (A, HTN).  Score of 1=0.6; 2=2.2; 3=3.2; 4=4.8; 5=7.2; 6=9.8; 7=>9.8) -(CHF; HTN; vasc disease DM,  Male = 1; Age <65 =0; 65-74 = 1,  >75 =2; stroke/embolism= 2).    Meds ordered this encounter  Medications  . DISCONTD: amiodarone (PACERONE) 200 MG tablet    Sig: Take 1 tablet (200 mg total) by mouth 2 (two) times daily.    Dispense:  60 tablet    Refill:  1    Medications Discontinued During This Encounter  Medication Reason  . tamsulosin (FLOMAX) 0.4 MG CAPS capsule Discontinued by provider  . furosemide (LASIX) 20 MG tablet Patient has not taken in last 30 days    Recommendations:   Stephen Cuevas  is a 12 y.o. Caucasian male who resides at [facility] assisted living facility and has history of hyperlipidemia, hypertension, sick sinus syndrome with paroxysmal atrial fibrillation, complete heart block who underwent permanent pacemaker implantation on 06/04/2020 for marked dyspnea, fatigue and dizziness. He is accompanied by son.  Patient had a fall and admitted on 09/05/2020 to the hospital after an accidental fall and had right clavicle fracture and pubic ramus fracture, recommended physical therapy and also stabilization with WBAT.  I reviewed his labs, external records and updated them.  Fortunately in spite of being 86 years of age he is trying his best to do rehab but has now been feeling extremely fatigued and weak over the past 2 months since the fall.  On review of his pacemaker, his atrial  fibrillation burden has been extremely high and nearly almost on a daily basis he has been having sustained episodes of atrial fibrillation.  Suspect his new onset fatigue is related to this.  I will start him on amiodarone 200 mg p.o. twice daily and I would like to see him back in 2 weeks with repeat EKG at which time I may consider reducing it to 200 mg once a day.  His blood pressure is soft and if he starts feeling better and heart rate is controlled I may consider discontinuing diltiazem on his next office visit.  He has not been able to do remote monitoring of his pacemaker due to the fact that he is now in rehab place at [place], MD, [facility] 10/28/2020, 1:10 PM Office: [phone]

## 2020-10-29 ENCOUNTER — Encounter: Payer: Self-pay | Admitting: Nurse Practitioner

## 2020-10-29 ENCOUNTER — Non-Acute Institutional Stay (SKILLED_NURSING_FACILITY): Payer: Medicare Other | Admitting: Nurse Practitioner

## 2020-10-29 DIAGNOSIS — S42021D Displaced fracture of shaft of right clavicle, subsequent encounter for fracture with routine healing: Secondary | ICD-10-CM

## 2020-10-29 DIAGNOSIS — R600 Localized edema: Secondary | ICD-10-CM

## 2020-10-29 DIAGNOSIS — F5105 Insomnia due to other mental disorder: Secondary | ICD-10-CM

## 2020-10-29 DIAGNOSIS — E785 Hyperlipidemia, unspecified: Secondary | ICD-10-CM

## 2020-10-29 DIAGNOSIS — K219 Gastro-esophageal reflux disease without esophagitis: Secondary | ICD-10-CM

## 2020-10-29 DIAGNOSIS — R609 Edema, unspecified: Secondary | ICD-10-CM

## 2020-10-29 DIAGNOSIS — F418 Other specified anxiety disorders: Secondary | ICD-10-CM

## 2020-10-29 DIAGNOSIS — I482 Chronic atrial fibrillation, unspecified: Secondary | ICD-10-CM | POA: Diagnosis not present

## 2020-10-29 DIAGNOSIS — N1831 Chronic kidney disease, stage 3a: Secondary | ICD-10-CM

## 2020-10-29 DIAGNOSIS — R350 Frequency of micturition: Secondary | ICD-10-CM

## 2020-10-29 DIAGNOSIS — M5432 Sciatica, left side: Secondary | ICD-10-CM

## 2020-10-29 DIAGNOSIS — D649 Anemia, unspecified: Secondary | ICD-10-CM | POA: Diagnosis not present

## 2020-10-29 DIAGNOSIS — I10 Essential (primary) hypertension: Secondary | ICD-10-CM

## 2020-10-29 DIAGNOSIS — K5901 Slow transit constipation: Secondary | ICD-10-CM

## 2020-10-29 DIAGNOSIS — E871 Hypo-osmolality and hyponatremia: Secondary | ICD-10-CM

## 2020-10-29 DIAGNOSIS — Z950 Presence of cardiac pacemaker: Secondary | ICD-10-CM | POA: Diagnosis not present

## 2020-10-29 NOTE — Progress Notes (Signed)
Location:   SNF [place] Room Number: 32 Place of Service:  SNF (31) Provider: [facility] Dezmond Downie NP  Virgie Dad, MD  Patient Care Team: Virgie Dad, MD as PCP - General (Internal Medicine)  Extended Emergency Contact Information Primary Emergency Contact: Michelle Nasuti Address: [address]          [place], [place] [address] Johnnette Litter of [place] Phone: [phone] Work Phone: [phone] Mobile Phone: [phone] Relation: Son  Code Status:  DNR Goals of care: Advanced Directive information Advanced Directives 10/21/2020  Does Patient Have a Medical Advance Directive? Yes  Type of Paramedic of [place];Out of facility DNR (pink MOST or yellow form)  Does patient want to make changes to medical advance directive? No - Patient declined  Copy of [place] in Chart? -  Would patient like information on creating a medical advance directive? -  Pre-existing out of facility DNR order (yellow form or pink MOST form) -     Chief Complaint  Patient presents with  . Acute Visit    F/u skin rash on arms, back, thighs. Anemia.     HPI:  Pt is a 86 y.o. male seen today for an acute visit for anemia, Hgb 9.9 10/28/20.   Anemia, mild, Hgb 10 09/09/20, 10.6 09/13/20, Hgb 9.8 09/20/20  Hospitalized 09/05/20-09/09/20 for a mechanical fall at home,resulted R clavicle and right pelvis ramus fx/WBAT.Improving, working with therapy Pacemaker8/22/19,f/u [name], interrogation for PM issues.  AFib, Eliquis, CHA2DS2-VAS score >3, f/u Dr. [name], last seen 10/28/20 started Amiodarone 251m bid.  HTN, takes Diltiazem, Furosemide,  Bun/creat 20/1.2 09/20/20 CKDBun/creat 20/1.2 10/11/20 Hyponatremia, Na 141 10/11/20 Urinary frequency, takes Tamsulosin 0.440mqd Prednisone 19m31md for chronic left sciatica pain. GERD, takes  Omeprazole  Depression/insmonia, takes Mirtazapine 7.5mg87ms Hyperlipidemia, takes Atorvastatin 10mg49m LDL 75 09/27/20 Constipation, takes Colace, Senna, MiraLax.             BLE edema, takes Furosemide, 2016 echocardiogram EF 55-60%   Past Medical History:  Diagnosis Date  . Ankle edema   . Anxiety   . Dysrhythmia    a-fib/flutter- on eliquis  . GERD (gastroesophageal reflux disease)    no treatment  . Hard of hearing   . Heart block AV complete (HCC) [place]8/2021  . Hypertension   . Kidney stone   . Presence of permanent cardiac pacemaker 06/03/2020  . Spinal stenosis   . Tachy-brady syndrome (HCC) [place]2/2021   Past Surgical History:  Procedure Laterality Date  . addnoid    . CYSTOSCOPY W/ URETEROSCOPY  1980  . INSERT / REPLACE / REMOVE PACEMAKER  06/03/2020  . KNEE SURGERY    . NAILBED REPAIR Left 06/27/2016   Procedure: NAILBED biopsy left thumb;  Surgeon: Gary Daryll Brod  [facility];  Laterality: Left;  FAB  . PACEMAKER IMPLANT N/A 06/03/2020   Procedure: PACEMAKER IMPLANT;  Surgeon: CamniConstance Haw  [facility];  Service: Cardiovascular;  Laterality: N/A;  . TONSILLECTOMY      No Known Allergies  Allergies as of 10/29/2020   No Known Allergies     Medication List       Accurate as of October 29, 2020  2:42 PM. If you have any questions, ask your nurse or doctor.        amiodarone 200 MG tablet Commonly known as: PACERONE Take 1 tablet (200 mg total) by mouth 2 (two)  times daily.   apixaban 2.5 MG Tabs tablet Commonly known as: ELIQUIS Take 1 tablet (2.5 mg total) by mouth 2 (two) times daily.   atorvastatin 10 MG tablet Commonly known as: LIPITOR Take 10 mg by mouth every evening.   diltiazem 120 MG tablet Commonly known as: CARDIZEM Take 120 mg by mouth daily.   ICAPS AREDS 2 PO Take 2 tablets by mouth 2 (two) times daily.   lactose  free nutrition Liqd Take 237 mLs by mouth 2 (two) times daily between meals.   Melatonin 10 MG Caps Take 5 mg by mouth at bedtime.   mirtazapine 7.5 MG tablet Commonly known as: REMERON Take 7.5 mg by mouth at bedtime.   omeprazole 20 MG capsule Commonly known as: PRILOSEC Take 20 mg by mouth daily with breakfast.   polyethylene glycol 17 g packet Commonly known as: MIRALAX / GLYCOLAX Take 17 g by mouth daily.   potassium chloride SA 20 MEQ tablet Commonly known as: KLOR-CON Take 20 mEq by mouth daily.   predniSONE 2 MG Tbec Take 2 mg by mouth daily.   senna-docusate 8.6-50 MG tablet Commonly known as: Senokot-S Take 1 tablet by mouth daily.   timolol 0.5 % ophthalmic solution Commonly known as: TIMOPTIC Place 1 drop into both eyes daily.   zinc oxide 20 % ointment Apply 1 application topically as needed for irritation.       Review of Systems  Constitutional: Negative for activity change, appetite change and fever.       #& Ibs weight gained in 2 weeks.   HENT: Positive for hearing loss. Negative for congestion and voice change.        Hearing aids.   Eyes: Negative for visual disturbance.  Respiratory: Negative for cough and shortness of breath.   Cardiovascular: Positive for leg swelling. Negative for chest pain and palpitations.  Gastrointestinal: Negative for abdominal pain and constipation.       R+L inguinal hernia.   Genitourinary: Positive for frequency. Negative for dysuria.       2-3 x/night, urinary leakage uses adult brief or condom cath  Musculoskeletal: Positive for arthralgias, back pain and gait problem.       Left sciatica pain   Skin: Positive for color change.       Bruise left shoulder, arm, chest  Neurological: Positive for numbness. Negative for speech difficulty, weakness and headaches.       Feels numb in fingers, but able to make fist  Psychiatric/Behavioral: Negative for confusion and sleep disturbance. The patient is not  nervous/anxious.     Immunization History  Administered Date(s) Administered  . Influenza, High Dose Seasonal PF 08/07/2016  . Influenza-Unspecified 08/04/2020  . Moderna SARS-COV2 Booster Vaccination 09/09/2020  . Moderna Sars-Covid-2 Vaccination 12/15/2019  . Pneumococcal-Unspecified 09/13/2020   Pertinent  Health Maintenance Due  Topic Date Due  . PNA vac Low Risk Adult (2 of 2 - PCV13) 09/13/2021  . INFLUENZA VACCINE  Completed   No flowsheet data found. Functional Status Survey:    Vitals:   10/29/20 1438  BP: (!) 148/72  Pulse: 73  Resp: 16  Temp: (!) 97.1 F (36.2 C)  SpO2: 97%   There is no height or weight on file to calculate BMI. Physical Exam Vitals and nursing note reviewed.  Constitutional:      Appearance: Normal appearance.  HENT:     Mouth/Throat:     Mouth: Mucous membranes are moist.  Eyes:     Extraocular Movements:  Extraocular movements intact.     Conjunctiva/sclera: Conjunctivae normal.     Pupils: Pupils are equal, round, and reactive to light.  Cardiovascular:     Rate and Rhythm: Normal rate and regular rhythm.     Comments: Pacemaker.  Pulmonary:     Effort: Pulmonary effort is normal.     Breath sounds: No rales.  Abdominal:     General: Bowel sounds are normal.     Palpations: Abdomen is soft.     Tenderness: There is no abdominal tenderness.     Hernia: A hernia is present.     Comments: R+L hermina  Musculoskeletal:     Cervical back: Normal range of motion and neck supple.     Right lower leg: Edema present.     Left lower leg: Edema present.     Comments:  Edema BLE trace now  Skin:    General: Skin is warm and dry.     Findings: No bruising or rash.  Neurological:     General: No focal deficit present.     Mental Status: He is alert and oriented to person, place, and time. Mental status is at baseline.     Motor: No weakness.     Coordination: Coordination normal.     Gait: Gait abnormal.  Psychiatric:        Mood  and Affect: Mood normal.        Behavior: Behavior normal.        Thought Content: Thought content normal.        Judgment: Judgment normal.     Labs reviewed: Recent Labs    09/06/20 0145 09/08/20 0322 09/09/20 0351 09/13/20 0000 09/20/20 0000  NA 137 133* 135 137 137  K 4.0 3.6 3.6 3.8 4.3  CL 104 101 101 101 102  CO2 '23 24 25 ' 28* 30*  GLUCOSE 95 108* 96  --   --   BUN 19 29* 23 26* 20  CREATININE 1.19 1.53* 1.25* 1.1 1.2  CALCIUM 8.5* 8.5* 8.7* 8.8 8.5*   Recent Labs    09/20/20 0000  AST 19  ALT 29  ALKPHOS 183*  ALBUMIN 2.9*   Recent Labs    09/05/20 0907 09/05/20 0913 09/06/20 0145 09/08/20 0322 09/09/20 0351 09/13/20 0000 09/20/20 0000  WBC 11.3*  --  9.1 10.9* 10.1 9.5 6.9  NEUTROABS 8.2*  --   --   --   --   --  3,912.00  HGB 12.3*   < > 10.9* 9.5* 10.0* 10.6* 9.8*  HCT 38.2*   < > 33.3* 29.5* 30.3* 32* 30*  MCV 92.9  --  92.5 92.5 92.7  --   --   PLT 185  --  167 138* 180 286 305   < > = values in this interval not displayed.   No results found for: TSH No results found for: HGBA1C Lab Results  Component Value Date   CHOL 137 09/27/2020   HDL 44 09/27/2020   LDLCALC 75 09/27/2020   TRIG 92 09/27/2020    Significant Diagnostic Results in last 30 days:  No results found.  Assessment/Plan Anemia 09/20/20 wbc 6.9, Hgb 9.8, plt 305, neutrophils 56.7%, Na 137, K 4.3, Bun 20, creat 1.17, eGFR 54 10/25/20 Na 143, K 4.1, Bun 20, creat 1.21, eGFR 52, wbc 7.6, Hgb 11.5, plt 187, total Fe 108, Vit B12 332, ferritin 124 10/28/20 Na 140, K 3.9, Bun 21, creat 1.14, eGFR 64, wbc 7.1, Hgb 9.9, plt 157,  neutrophils 51.6%  Likely anemia in chronic disease natured, Vit B12 is at the lower end of normal, will trial of Vit B12 1016mg po qd, update CBC in 2 weeks  Closed traumatic minimally displaced fracture of shaft of right clavicle Hospitalized 09/05/20-09/09/20 for a mechanical fall at home,resulted R clavicle and right pelvis ramus fx/WBAT.Improving,  working with tRosemarie Beathfor pain.   [employer] MRI Dual Chamber pacemaker 06/03/2018 Pacemaker8/22/19,f/u Dr. CCurt Bears interrogation for PM issues.    Atrial fibrillation, chronic (HCC) AFib, Eliquis, CHA2DS2-VAS score >3, f/u Dr. GEinar Gip  last seen 10/28/20 started Amiodarone 2045mbid.     Essential hypertension HTN, takes Diltiazem, Furosemide,  Bun/creat 20/1.2 09/20/20   CKD (chronic kidney disease) stage 3, GFR 30-59 ml/min (HCC) CKDBun/creat 20/1.2 10/11/20   Hyponatremia Hyponatremia, Na 141 10/11/20  Urinary frequency Urinary frequency, takes Tamsulosin 0.35m61md  Sciatica, left side Prednisone 2mg75m for chronic left sciatica pain.  GERD (gastroesophageal reflux disease) GERD, takes Omeprazole    Insomnia secondary to depression with anxiety Depression/insmonia, takes Mirtazapine 7.5mg 45m   Hyperlipidemia Hyperlipidemia, takes Atorvastatin 10mg 28mLDL 75 09/27/20  Slow transit constipation Constipation, takes Colace, Senna, MiraLax.   Peripheral edema  BLE edema, improved,  takes Furosemide,  2016 echocardiogram EF 55-60%      Family/ staff Communication:  Plan of care reviewed with the patient and charge nurse.    Labs/tests ordered: CBC 2 weeks  Time spend 35 minutes.

## 2020-10-29 NOTE — Assessment & Plan Note (Signed)
09/20/20 wbc 6.9, Hgb 9.8, plt 305, neutrophils 56.7%, Na 137, K 4.3, Bun 20, creat 1.17, eGFR 54 10/25/20 Na 143, K 4.1, Bun 20, creat 1.21, eGFR 52, wbc 7.6, Hgb 11.5, plt 187, total Fe 108, Vit B12 332, ferritin 124 10/28/20 Na 140, K 3.9, Bun 21, creat 1.14, eGFR 64, wbc 7.1, Hgb 9.9, plt 157, neutrophils 51.6%  Likely anemia in chronic disease natured, Vit B12 is at the lower end of normal, will trial of Vit B12 1062mg po qd, update CBC in 2 weeks

## 2020-10-29 NOTE — Assessment & Plan Note (Signed)
GERD, takes Omeprazole  

## 2020-10-29 NOTE — Assessment & Plan Note (Signed)
Hyponatremia, Na 141 10/11/20

## 2020-10-29 NOTE — Assessment & Plan Note (Signed)
Hyperlipidemia, takes Atorvastatin 10mg  qd, LDL 75 09/27/20

## 2020-10-29 NOTE — Assessment & Plan Note (Signed)
Prednisone 2mg  qd for chronic left sciatica pain.

## 2020-10-29 NOTE — Assessment & Plan Note (Addendum)
AFib, Eliquis, CHA2DS2-VAS score >3, f/u Dr. [name],  last seen 10/28/20 started Amiodarone 200mg  bid.

## 2020-10-29 NOTE — Assessment & Plan Note (Signed)
Constipation, takes Colace, Senna, MiraLax.

## 2020-10-29 NOTE — Assessment & Plan Note (Signed)
Urinary frequency, takes Tamsulosin 0.4mg  qd

## 2020-10-29 NOTE — Assessment & Plan Note (Signed)
CKDBun/creat 20/1.2 10/11/20

## 2020-10-29 NOTE — Assessment & Plan Note (Signed)
Pacemaker8/22/19,f/u [name], interrogation for PM issues.

## 2020-10-29 NOTE — Assessment & Plan Note (Signed)
Hospitalized 09/05/20-09/09/20 for a mechanical fall at home,resulted R clavicle and right pelvis ramus fx/WBAT.Improving, working with Rosemarie Beath for pain.

## 2020-10-29 NOTE — Assessment & Plan Note (Signed)
HTN, takes Diltiazem, Furosemide,  Bun/creat 20/1.2 09/20/20

## 2020-10-29 NOTE — Assessment & Plan Note (Addendum)
BLE edema, improved,  takes Furosemide,  2016 echocardiogram EF 55-60%

## 2020-10-29 NOTE — Assessment & Plan Note (Signed)
Depression/insmonia, takes Mirtazapine 7.5mg qhs 

## 2020-11-10 NOTE — Progress Notes (Signed)
Primary Physician/Referring:  Virgie Dad, MD  Patient ID: Stephen Cuevas, male    DOB: July 05, 1926, 86 y.o.   MRN: [id number]  Chief Complaint  Patient presents with  . Atrial Fibrillation   HPI:    Stephen Cuevas  is a 86 y.o. Caucasian male who resides at [facility] assisted living facility and has history of hyperlipidemia, hypertension, sick sinus syndrome with paroxysmal atrial fibrillation, complete heart block who underwent permanent pacemaker implantation on 06/04/2020 for marked dyspnea, fatigue and dizziness.   Patient had a fall and admitted on 09/05/2020 to the hospital after an accidental fall and had right clavicle fracture and pubic ramus fracture, recommended physical therapy and also stabilization with WBAT  Patient presents for 2 week follow up of atrial fibrillation. At last visit pacemaker revealed very high atrial fibrillation burden which was suspected to be the cause of patient's worsening fatigue. He as started on amiodarone 200 mg BID at last visit. Patient is tolerating amiodarone well and reports he is feeling much better compared to previous visit. Fatigue and dyspnea have significantly improved. Patient continues to do physical therapy daily and is working to transition from skilled nursing facility to assisted living, and ideally back to independent living eventually. Patient is accompanied by his son today.   Past Medical History:  Diagnosis Date  . Ankle edema   . Anxiety   . Dysrhythmia    a-fib/flutter- on eliquis  . GERD (gastroesophageal reflux disease)    no treatment  . Hard of hearing   . Heart block AV complete ([place]) 07/31/2020  . Hypertension   . Kidney stone   . Presence of permanent cardiac pacemaker 06/03/2020  . Spinal stenosis   . Tachy-brady syndrome ([place]) 06/03/2020   Past Surgical History:  Procedure Laterality Date  . addnoid    . CYSTOSCOPY W/ URETEROSCOPY  1980  . INSERT / REPLACE / REMOVE PACEMAKER  06/03/2020  .  KNEE SURGERY    . NAILBED REPAIR Left 06/27/2016   Procedure: NAILBED biopsy left thumb;  Surgeon: Daryll Brod, MD;  Location: [facility];  Service: Orthopedics;  Laterality: Left;  FAB  . PACEMAKER IMPLANT N/A 06/03/2020   Procedure: PACEMAKER IMPLANT;  Surgeon: Constance Haw, MD;  Location: [facility];  Service: Cardiovascular;  Laterality: N/A;  . TONSILLECTOMY     Family History  Problem Relation Age of Onset  . Heart failure Father   . Heart disease Father     Social History   Tobacco Use  . Smoking status: Former Smoker    Packs/day: 1.50    Years: 20.00    Pack years: 30.00    Types: Cigarettes, Pipe, Cigars    Quit date: 03/13/1956    Years since quitting: 64.7  . Smokeless tobacco: Never Used  Substance Use Topics  . Alcohol use: Yes    Comment: daily   Marital Status: Widowed ROS  Review of Systems  Constitutional: Negative for malaise/fatigue.  Cardiovascular: Positive for leg swelling (mild, bilateral). Negative for chest pain, dyspnea on exertion, near-syncope, orthopnea, palpitations and syncope.  Musculoskeletal: Positive for arthritis and joint pain (bilateral hip and right arm since fall).  Gastrointestinal: Negative for melena.  Neurological: Positive for dizziness (rare).   Objective  Blood pressure (!) 137/59, pulse 62, resp. rate 16, height _0  (1.676 m), weight 160 lb (72.6 kg), SpO2 97 %.  Vitals with BMI 11/11/2020 10/29/2020 10/28/2020  Height _1  - _2   Weight 160 lbs - 160 lbs  BMI 01.77 - 93.90  Systolic 300 923 300  Diastolic 59 72 73  Pulse 62 73 81     Physical Exam Constitutional:      Appearance: Normal appearance.  HENT:     Head: Atraumatic.  Cardiovascular:     Rate and Rhythm: Normal rate. Rhythm irregular.     Pulses: Intact distal pulses.          Carotid pulses are 2+ on the right side and 2+ on the left side.      Popliteal pulses are 2+ on the right side and 2+ on the left side.        Dorsalis pedis pulses are 2+ on the right side and 2+ on the left side.       Posterior tibial pulses are 1+ on the right side and 1+ on the left side.     Heart sounds: S1 normal and S2 normal. Murmur heard.   Early systolic murmur is present with a grade of 2/6 at the upper right sternal border. No gallop.      Comments: No JVD.  2+ bilateral pitting leg edema Pulmonary:     Effort: Pulmonary effort is normal.     Breath sounds: Normal breath sounds.  Skin:    General: Skin is warm and dry.     Capillary Refill: Capillary refill takes less than 2 seconds.  Neurological:     General: No focal deficit present.     Mental Status: He is alert and oriented to person, place, and time.    Laboratory examination:   CMP Latest Ref Rng & Units 09/20/2020 09/13/2020 09/09/2020  Glucose 70 - 99 mg/dL - - 96  BUN 4 - 21 20 26(A) 23  Creatinine 0.6 - 1.3 1.2 1.1 1.25(H)  Sodium 137 - 147 137 137 135  Potassium 3.4 - 5.3 4.3 3.8 3.6  Chloride 99 - 108 102 101 101  CO2 13 - 22 30(A) 28(A) 25  Calcium 8.7 - 10.7 8.5(A) 8.8 8.7(L)  Total Protein 6.0 - 8.3 g/dL - - -  Total Bilirubin 0.3 - 1.2 mg/dL - - -  Alkaline Phos 25 - 125 183(A) - -  AST 14 - 40 19 - -  ALT 10 - 40 29 - -   CBC Latest Ref Rng & Units 09/20/2020 09/13/2020 09/09/2020  WBC - 6.9 9.5 10.1  Hemoglobin 13.5 - 17.5 9.8(A) 10.6(A) 10.0(L)  Hematocrit 41 - 53 30(A) 32(A) 30.3(L)  Platelets 150 - 399 305 286 180   External labs:  02/16/2020: Serum glucose 105 mg, BUN 22, creatinine 1.7, EGFR 37.9 mL, potassium 4.7. Hb 12.3/HCT 40.9, platelets 190. Total cholesterol 162, triglycerides 70, HDL 63, LDL 85.  Non-HDL cholesterol 99. PSA normal   Medications and allergies  No Known Allergies   Current Outpatient Medications on File Prior to Visit  Medication Sig Dispense Refill  . apixaban (ELIQUIS) 2.5 MG TABS tablet Take 1 tablet (2.5 mg total) by mouth 2 (two) times daily. 180 tablet 3  . atorvastatin (LIPITOR) 10 MG  tablet Take 10 mg by mouth every evening.   12  . diltiazem (CARDIZEM) 120 MG tablet Take 120 mg by mouth daily.    Marland Kitchen lactose free nutrition (BOOST) LIQD Take 237 mLs by mouth 2 (two) times daily between meals.    . Melatonin 10 MG CAPS Take 5 mg by mouth at bedtime.     . mirtazapine (REMERON) 7.5 MG  tablet Take 7.5 mg by mouth at bedtime.     . Multiple Vitamins-Minerals (ICAPS AREDS 2 PO) Take 2 tablets by mouth 2 (two) times daily.    Marland Kitchen omeprazole (PRILOSEC) 20 MG capsule Take 20 mg by mouth daily with breakfast.     . polyethylene glycol (MIRALAX / GLYCOLAX) 17 g packet Take 17 g by mouth daily.    . potassium chloride SA (KLOR-CON) 20 MEQ tablet Take 20 mEq by mouth daily.    . predniSONE 2 MG TBEC Take 2 mg by mouth daily.    Marland Kitchen senna-docusate (SENOKOT-S) 8.6-50 MG tablet Take 1 tablet by mouth daily.    . timolol (TIMOPTIC) 0.5 % ophthalmic solution Place 1 drop into both eyes daily.    Marland Kitchen zinc oxide 20 % ointment Apply 1 application topically as needed for irritation.     No current facility-administered medications on file prior to visit.    Lisinopril on hold since 02/16/19 per PCP  Radiology:   Chest x-ray PA and lateral view 02/16/2020: Mild hyperextension, patchy right basilar infiltrate, borderline cardiomegaly, atherosclerosis, thoracic levoscoliosis and spondylosis.  Cardiac Studies:   Echocardiogram 02/26/2020:  Normal LV systolic function with visual EF 60-65%. Left ventricle cavity is normal in size. Mild left ventricular hypertrophy. Normal global wall  motion. Doppler evidence of grade II diastolic dysfunction. Calculated EF 66%.  Left atrial cavity is severely dilated, 75m/m2.  Right atrial cavity is dilated.  No evidence of aortic stenosis. Mild aortic regurgitation.  Mild to moderate mitral regurgitation.  Moderate tricuspid regurgitation. Moderate pulmonary hypertension. RVSP measures 51 mmHg.  IVC is dilated with a respiratory response of <50%.  No prior  study for comparison.  Event monitor 04/27/2020 through 05/17/2020: Patient had paroxysmal episodes of atrial fibrillation with 4% atrial fibrillation burden. Predominant rhythm was sinus rhythm with nonconducted P waves and 1: 1 Mobitz 2 AV  block at 10:21 PM on 05/10/2020. Patient also had a 3.4-second ventricular standstill at 10:35 PM on 05/04/2020   [place] Dual [place] pacemaker 06/03/2018    Scheduled  In office pacemaker check 10/28/20  Single (S)/Dual (D)/BV: D. Presenting AFVS. Pacemaker dependant:  No. Underlying AF with VR 80-100, irregular. AP 16%, VP 12%.  AMS Episodes 4178.  AT/AF burden 21% . Longest 1D7H. Latest Ongoing since 8AM today. HVR 1. Longest 9Sec, 28 beat NSVT @ 192/min.   Longevity 9.6-11.1 Years. Magnet rate: >85%. Lead measurements: Stable..Marland KitchenHistogram: Low (L)/normal (N)/high (H)  Normal. Patient activity Decreased since Oct 2021.   Observations: Normal pacemaker function.  Frequent episodes of atrial fibrillation, since September 02, 2020, A. fib burden has been ranging from 17 up to 48%. Changes: None.    EKG   EKG 11/11/2020: Atrial paced rhythm with underlying sinus bradycardia with first-degree AV block at a rate of 62 bpm.  Left axis, left anterior fascicular block.  Right bundle branch block. Biventricular block  EKG 06/02/2020: Underlying sinus bradycardia at rate of 30 bpm with complete heart block and junctional escape, frequent PACs in bigeminal pattern that appeared to be conducted.  Left axis deviation, left anterior fascicular block.  Right bundle branch block.   EKG 03/29/2020: Sinus rhythm with first-degree block at rate of 83 bpm, leftward enlargement, left axis deviation, left intrafascicular block.  Right bundle branch block.  Bifascicular block.  Low-voltage complexes.  Nonspecific T abnormality.  EKG 03/22/2020, PCP EKG.  Sinus tachycardia at rate of 100 bpm with 2: 1 AV conduction, Mobitz  2 AV block 1: 1.   Left axis deviation, left anterior fascicular block.  Right bundle branch block.  02/20/2020: Sinus bradycardia with first-degree AV block at the rate of 52 bpm, left axis deviation, left anterior fascicular block.  Right bundle branch block.  Trifascicular block.  Low-voltage complexes.  No evidence of ischemia.  02/20/2020: Atrial fibrillation with controlled ventricular response at rate of 49 bpm, left axis deviation, left anterior fascicular block.  Right bundle branch block.  Nonspecific T abnormality.    Assessment     ICD-10-CM   1. Paroxysmal atrial fibrillation (HCC)  I48.0 EKG 12-Lead    amiodarone (PACERONE) 200 MG tablet  2. Tachycardia-bradycardia syndrome ([place])  I49.5     CHA2DS2-VASc Score is 3.  Yearly risk of stroke: 3.2% (A, HTN).  Meds ordered this encounter  Medications  . amiodarone (PACERONE) 200 MG tablet    Sig: Take 1 tablet (200 mg total) by mouth daily.    Dispense:  60 tablet    Refill:  1    Medications Discontinued During This Encounter  Medication Reason  . amiodarone (PACERONE) 200 MG tablet     Recommendations:   Stephen Stephen Cuevas  is a 44 y.o. Caucasian male who resides at [facility] assisted living facility and has history of hyperlipidemia, hypertension, sick sinus syndrome with paroxysmal atrial fibrillation, complete heart block who underwent permanent pacemaker implantation on 06/04/2020 for marked dyspnea, fatigue and dizziness.   Patient had a fall and admitted on 09/05/2020 to the hospital after an accidental fall and had right clavicle fracture and pubic ramus fracture, recommended physical therapy and also stabilization with WBAT  Patient presents for 2 week follow up for atrial fibrillation. Since starting amiodarone patient has had significant improvement in symptoms of fatigue and unsteadiness.  EKG today underlying sinus bradycardia and no clinical signs, although he does have chronic stable bilateral leg edema.  Patient continues  to progress well in physical therapy and is planning to transition from skilled nursing to, and then ideally back to his independent living apartment.  Blood pressure is in office today although patient reports blood pressures at the skilled nursing facility to be well controlled.  At this time ROS patient is patient's heart rate is well controlled and he has been sinus rhythm will reduce amiodarone from 200 mg twice daily to 20 mg once daily.  As blood pressure is mildly elevated slightly elevated today, will continue diltiazem.   Of note patient is accompanied by his son who reports that the transmitter for patient's pacemaker has now been moved to patient's room in the skilled nursing facility. Advised patient that our office will continue to monitor for transmissions.   As patient is presently being treated with amiodarone, they will need annual monitoring of PFTs, thyroid function, liver function, and ophthalmologic exam. Patient is aware of these monitoring parameters and agrees.   Follow up in 3 months.    Alethia Berthold, PA-C 11/17/2020, 3:18 PM Office: [phone]

## 2020-11-11 ENCOUNTER — Ambulatory Visit: Payer: Medicare Other | Admitting: Student

## 2020-11-11 ENCOUNTER — Encounter: Payer: Self-pay | Admitting: Student

## 2020-11-11 ENCOUNTER — Other Ambulatory Visit: Payer: Self-pay

## 2020-11-11 VITALS — BP 137/59 | HR 62 | Resp 16 | Ht 66.0 in | Wt 160.0 lb

## 2020-11-11 DIAGNOSIS — I495 Sick sinus syndrome: Secondary | ICD-10-CM

## 2020-11-11 DIAGNOSIS — I480 Paroxysmal atrial fibrillation: Secondary | ICD-10-CM

## 2020-11-11 LAB — CBC AND DIFFERENTIAL
HCT: 30 — AB (ref 41–53)
Hemoglobin: 9.9 — AB (ref 13.5–17.5)
Platelets: 171 (ref 150–399)
WBC: 7.4

## 2020-11-11 LAB — CBC: RBC: 3.26 — AB (ref 3.87–5.11)

## 2020-11-11 MED ORDER — AMIODARONE HCL 200 MG PO TABS: 200.0000 mg | ORAL_TABLET | Freq: Every day | ORAL | 1 refills | Status: DC

## 2020-11-25 ENCOUNTER — Non-Acute Institutional Stay (SKILLED_NURSING_FACILITY): Payer: Medicare Other | Admitting: Internal Medicine

## 2020-11-25 ENCOUNTER — Encounter: Payer: Self-pay | Admitting: Internal Medicine

## 2020-11-25 DIAGNOSIS — F418 Other specified anxiety disorders: Secondary | ICD-10-CM

## 2020-11-25 DIAGNOSIS — S42021D Displaced fracture of shaft of right clavicle, subsequent encounter for fracture with routine healing: Secondary | ICD-10-CM | POA: Diagnosis not present

## 2020-11-25 DIAGNOSIS — I482 Chronic atrial fibrillation, unspecified: Secondary | ICD-10-CM

## 2020-11-25 DIAGNOSIS — K219 Gastro-esophageal reflux disease without esophagitis: Secondary | ICD-10-CM

## 2020-11-25 DIAGNOSIS — E871 Hypo-osmolality and hyponatremia: Secondary | ICD-10-CM

## 2020-11-25 DIAGNOSIS — Z950 Presence of cardiac pacemaker: Secondary | ICD-10-CM

## 2020-11-25 DIAGNOSIS — N1831 Chronic kidney disease, stage 3a: Secondary | ICD-10-CM

## 2020-11-25 DIAGNOSIS — I10 Essential (primary) hypertension: Secondary | ICD-10-CM

## 2020-11-25 DIAGNOSIS — K5901 Slow transit constipation: Secondary | ICD-10-CM

## 2020-11-25 DIAGNOSIS — M5432 Sciatica, left side: Secondary | ICD-10-CM

## 2020-11-25 DIAGNOSIS — F5105 Insomnia due to other mental disorder: Secondary | ICD-10-CM

## 2020-11-25 DIAGNOSIS — D649 Anemia, unspecified: Secondary | ICD-10-CM

## 2020-11-25 DIAGNOSIS — R600 Localized edema: Secondary | ICD-10-CM

## 2020-11-25 NOTE — Progress Notes (Signed)
Location:    [facility] Room Number: 32 Place of Service:  SNF [phone])  Provider: Veleta Miners MD  PCP: Virgie Dad, MD Patient Care Team: Virgie Dad, MD as PCP - General (Internal Medicine)  Extended Emergency Contact Information Primary Emergency Contact: Michelle Nasuti Address: [address]          [place], [place] [address] Johnnette Litter of [place] Phone: [phone] Work Phone: [phone] Mobile Phone: [phone] Relation: Son  Code Status: DNR Goals of care:  Advanced Directive information Advanced Directives 10/21/2020  Does Patient Have a Medical Advance Directive? Yes  Type of Paramedic of [place];Out of facility DNR (pink MOST or yellow form)  Does patient want to make changes to medical advance directive? No - Patient declined  Copy of [place] in Chart? -  Would patient like information on creating a medical advance directive? -  Pre-existing out of facility DNR order (yellow form or pink MOST form) -     No Known Allergies  Chief Complaint  Patient presents with  . Discharge Note    Discharge to AL     HPI:  87 y.o. male  For discharge from SNF   Patient was admitted in the hospital from 10/24-10/28 after mechanical fall in his apartment With the right clavicle and right pubic ramus fracture. Patient has a history of A. fib on Eliquis, hypertension, hyperlipidemia, s/p pacemaker placed  Did well with Therapy Now walking with the walker very well. Pain Controlled on Tylenol Was started on Amiodarone by his Cardiology for Rate control of his A Fib Also was started on Lasix for LE edema Has tolerated that also on Eliquis Plan to go to AL for few weeks before discharge to his Apartment  Had no Acute issues today   Past Medical History:  Diagnosis Date  . Ankle edema   . Anxiety   . Dysrhythmia    a-fib/flutter- on eliquis  . GERD (gastroesophageal reflux  disease)    no treatment  . Hard of hearing   . Heart block AV complete ([place]) 07/31/2020  . Hypertension   . Kidney stone   . Presence of permanent cardiac pacemaker 06/03/2020  . Spinal stenosis   . Tachy-brady syndrome ([place]) 06/03/2020    Past Surgical History:  Procedure Laterality Date  . addnoid    . CYSTOSCOPY W/ URETEROSCOPY  1980  . INSERT / REPLACE / REMOVE PACEMAKER  06/03/2020  . KNEE SURGERY    . NAILBED REPAIR Left 06/27/2016   Procedure: NAILBED biopsy left thumb;  Surgeon: Daryll Brod, MD;  Location: [facility];  Service: Orthopedics;  Laterality: Left;  FAB  . PACEMAKER IMPLANT N/A 06/03/2020   Procedure: PACEMAKER IMPLANT;  Surgeon: Constance Haw, MD;  Location: [facility];  Service: Cardiovascular;  Laterality: N/A;  . TONSILLECTOMY        reports that he quit smoking about 64 years ago. His smoking use included cigarettes, pipe, and cigars. He has a 30.00 pack-year smoking history. He has never used smokeless tobacco. He reports current alcohol use. He reports that he does not use drugs. Social History   Socioeconomic History  . Marital status: Widowed    Spouse name: Not on file  . Number of children: 4  . Years of education: Not on file  . Highest education level: Not on file  Occupational History  . Occupation: retired  Tobacco Use  . Smoking  status: Former Smoker    Packs/day: 1.50    Years: 20.00    Pack years: 30.00    Types: Cigarettes, Pipe, Cigars    Quit date: 03/13/1956    Years since quitting: 64.7  . Smokeless tobacco: Never Used  Vaping Use  . Vaping Use: Never used  Substance and Sexual Activity  . Alcohol use: Yes    Comment: daily  . Drug use: No  . Sexual activity: Not on file  Other Topics Concern  . Not on file  Social History Narrative  . Not on file   Social Determinants of Health   Financial Resource Strain: Not on file  Food Insecurity: Not on file  Transportation Needs: Not on file   Physical Activity: Not on file  Stress: Not on file  Social Connections: Not on file  Intimate Partner Violence: Not on file   Functional Status Survey:    No Known Allergies  Pertinent  Health Maintenance Due  Topic Date Due  . PNA vac Low Risk Adult (2 of 2 - PCV13) 09/13/2021  . INFLUENZA VACCINE  Completed    Medications: Allergies as of 11/25/2020   No Known Allergies     Medication List       Accurate as of November 25, 2020 11:59 PM. If you have any questions, ask your nurse or doctor.        acetaminophen 325 MG tablet Commonly known as: TYLENOL Take 650 mg by mouth every 8 (eight) hours as needed.   amiodarone 200 MG tablet Commonly known as: PACERONE Take 1 tablet (200 mg total) by mouth daily.   apixaban 2.5 MG Tabs tablet Commonly known as: ELIQUIS Take 1 tablet (2.5 mg total) by mouth 2 (two) times daily.   atorvastatin 10 MG tablet Commonly known as: LIPITOR Take 10 mg by mouth every evening.   cyanocobalamin 1000 MCG tablet Take 1,000 mcg by mouth every morning.   diltiazem 120 MG tablet Commonly known as: CARDIZEM Take 120 mg by mouth daily.   furosemide 20 MG tablet Commonly known as: LASIX Take 40 mg by mouth daily.   ICAPS AREDS 2 PO Take 2 tablets by mouth 2 (two) times daily.   lactose free nutrition Liqd Take 237 mLs by mouth 2 (two) times daily between meals.   Melatonin 10 MG Caps Take 5 mg by mouth at bedtime.   mirtazapine 7.5 MG tablet Commonly known as: REMERON Take 7.5 mg by mouth at bedtime.   omeprazole 20 MG capsule Commonly known as: PRILOSEC Take 20 mg by mouth daily with breakfast.   polyethylene glycol 17 g packet Commonly known as: MIRALAX / GLYCOLAX Take 17 g by mouth daily.   potassium chloride SA 20 MEQ tablet Commonly known as: KLOR-CON Take 40 mEq by mouth daily.   predniSONE 2 MG Tbec Take 2 mg by mouth daily.   senna-docusate 8.6-50 MG tablet Commonly known as: Senokot-S Take 1 tablet by  mouth daily.   tamsulosin 0.4 MG Caps capsule Commonly known as: FLOMAX Take 0.4 mg by mouth every morning.   timolol 0.5 % ophthalmic solution Commonly known as: TIMOPTIC Place 1 drop into both eyes daily.   zinc oxide 20 % ointment Apply 1 application topically as needed for irritation.       Review of Systems  Vitals:   11/25/20 0931  BP: 107/62  Pulse: 61  Resp: 18  Temp: (!) 97.2 F (36.2 C)  SpO2: 96%  Weight: 158 lb 12.8 oz (  72 kg)  Height: 5\' 6"  (1.676 m)   Body mass index is 25.63 kg/m. Physical Exam  Constitutional: Oriented to person, place, and time. Well-developed and well-nourished.  HENT:  Head: Normocephalic.  Mouth/Throat: Oropharynx is clear and moist.  Eyes: Pupils are equal, round, and reactive to light.  Neck: Neck supple.  Cardiovascular: Normal rate and normal heart sounds.  No murmur heard. Pulmonary/Chest: Effort normal and breath sounds normal. No respiratory distress. No wheezes. She has no rales.  Abdominal: Soft. Bowel sounds are normal. No distension. There is no tenderness. There is no rebound.  Musculoskeletal: Mild Edema Bilateral Lymphadenopathy: none Neurological: Alert and oriented to person, place, and time.  Skin: Skin is warm and dry.  Psychiatric: Normal mood and affect. Behavior is normal. Thought content normal.    Labs reviewed: Basic Metabolic Panel: Recent Labs    09/06/20 0145 09/08/20 0322 09/09/20 0351 09/13/20 0000 09/20/20 0000 10/28/20 0000  NA 137 133* 135 137 137 140  K 4.0 3.6 3.6 3.8 4.3 3.9  CL 104 101 101 101 102 108  CO2 23 24 25  28* 30* 26*  GLUCOSE 95 108* 96  --   --   --   BUN 19 29* 23 26* 20 21  CREATININE 1.19 1.53* 1.25* 1.1 1.2 1.1  CALCIUM 8.5* 8.5* 8.7* 8.8 8.5* 8.2*   Liver Function Tests: Recent Labs    09/20/20 0000 10/28/20 0000  AST 19 15  ALT 29 13  ALKPHOS 183* 104  ALBUMIN 2.9* 3.1*   No results for input(s): LIPASE, AMYLASE in the last 8760 hours. No results for  input(s): AMMONIA in the last 8760 hours. CBC: Recent Labs    09/05/20 0907 09/05/20 0913 09/06/20 0145 09/08/20 0322 09/09/20 0351 09/13/20 0000 09/20/20 0000 10/28/20 0000 11/11/20 0000  WBC 11.3*  --  9.1 10.9* 10.1   < > 6.9 7.1 7.4  NEUTROABS 8.2*  --   --   --   --   --  3,912.00 3,649.00  --   HGB 12.3*   < > 10.9* 9.5* 10.0*   < > 9.8* 9.9* 9.9*  HCT 38.2*   < > 33.3* 29.5* 30.3*   < > 30* 29* 30*  MCV 92.9  --  92.5 92.5 92.7  --   --   --   --   PLT 185  --  167 138* 180   < > 305 157 171   < > = values in this interval not displayed.   Cardiac Enzymes: No results for input(s): CKTOTAL, CKMB, CKMBINDEX, TROPONINI in the last 8760 hours. BNP: Invalid input(s): POCBNP CBG: No results for input(s): GLUCAP in the last 8760 hours.  Procedures and Imaging Studies During Stay: No results found.  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 [employer] MRI Dual Chamber pacemaker 06/03/2018 Follows with Cardiology Atrial fibrillation, chronic (HCC) Was started on Amiodarone due to High burden of A Fib per Cardiology Already on Eliquis and Cardizem  Essential hypertension On Cardizem Sciatica, left side On Low dose of prednisone per his PCP Insomnia secondary to depression with anxiety On Remeron Bilateral leg edema Doing better with Lasix Hyponatremia Sodium stable Follow up  Stage 3a chronic kidney disease (HCC) Creat stable on  Lasix Slow transit constipation Continue Senna and Miralax Gastroesophageal reflux disease, unspecified whether esophagitis present On prilosec Hyperlipidemia On Lipitor LDL 75 in 11/21 BPH Did well on Flomax  Patient is being discharged to [facility]  with the walker Will need to follow with PCP after gets back to his Apartment  Future labs/tests needed:

## 2020-12-02 ENCOUNTER — Ambulatory Visit (INDEPENDENT_AMBULATORY_CARE_PROVIDER_SITE_OTHER): Payer: Medicare Other

## 2020-12-02 DIAGNOSIS — I442 Atrioventricular block, complete: Secondary | ICD-10-CM

## 2020-12-02 LAB — CUP PACEART REMOTE DEVICE CHECK
Battery Remaining Longevity: 117 mo
Battery Remaining Percentage: 95.5 %
Battery Voltage: 3.02 V
Brady Statistic AP VP Percent: 14 %
Brady Statistic AP VS Percent: 27 %
Brady Statistic AS VP Percent: 6 %
Brady Statistic AS VS Percent: 53 %
Brady Statistic RA Percent Paced: 36 %
Brady Statistic RV Percent Paced: 23 %
Date Time Interrogation Session: 20220120030912
Implantable Lead Implant Date: 20210722
Implantable Lead Implant Date: 20210722
Implantable Lead Location: 753859
Implantable Lead Location: 753860
Implantable Pulse Generator Implant Date: 20210722
Lead Channel Impedance Value: 400 Ohm
Lead Channel Impedance Value: 560 Ohm
Lead Channel Pacing Threshold Amplitude: 0.875 V
Lead Channel Pacing Threshold Amplitude: 1 V
Lead Channel Pacing Threshold Pulse Width: 0.4 ms
Lead Channel Pacing Threshold Pulse Width: 0.4 ms
Lead Channel Sensing Intrinsic Amplitude: 2.2 mV
Lead Channel Sensing Intrinsic Amplitude: 7.8 mV
Lead Channel Setting Pacing Amplitude: 2 V
Lead Channel Setting Pacing Amplitude: 2.5 V
Lead Channel Setting Pacing Pulse Width: 0.4 ms
Lead Channel Setting Sensing Sensitivity: 2 mV
Pulse Gen Model: 2272
Pulse Gen Serial Number: 3851047

## 2020-12-06 ENCOUNTER — Non-Acute Institutional Stay: Payer: Medicare Other | Admitting: Nurse Practitioner

## 2020-12-06 ENCOUNTER — Encounter: Payer: Self-pay | Admitting: Nurse Practitioner

## 2020-12-06 DIAGNOSIS — R600 Localized edema: Secondary | ICD-10-CM

## 2020-12-06 DIAGNOSIS — R350 Frequency of micturition: Secondary | ICD-10-CM

## 2020-12-06 DIAGNOSIS — E785 Hyperlipidemia, unspecified: Secondary | ICD-10-CM

## 2020-12-06 DIAGNOSIS — I495 Sick sinus syndrome: Secondary | ICD-10-CM

## 2020-12-06 DIAGNOSIS — F418 Other specified anxiety disorders: Secondary | ICD-10-CM

## 2020-12-06 DIAGNOSIS — F5105 Insomnia due to other mental disorder: Secondary | ICD-10-CM

## 2020-12-06 DIAGNOSIS — N1831 Chronic kidney disease, stage 3a: Secondary | ICD-10-CM

## 2020-12-06 DIAGNOSIS — S42021D Displaced fracture of shaft of right clavicle, subsequent encounter for fracture with routine healing: Secondary | ICD-10-CM

## 2020-12-06 DIAGNOSIS — I10 Essential (primary) hypertension: Secondary | ICD-10-CM

## 2020-12-06 DIAGNOSIS — K219 Gastro-esophageal reflux disease without esophagitis: Secondary | ICD-10-CM

## 2020-12-06 DIAGNOSIS — K5901 Slow transit constipation: Secondary | ICD-10-CM | POA: Diagnosis not present

## 2020-12-06 DIAGNOSIS — R609 Edema, unspecified: Secondary | ICD-10-CM | POA: Diagnosis not present

## 2020-12-06 DIAGNOSIS — D649 Anemia, unspecified: Secondary | ICD-10-CM

## 2020-12-06 DIAGNOSIS — E871 Hypo-osmolality and hyponatremia: Secondary | ICD-10-CM

## 2020-12-06 DIAGNOSIS — I482 Chronic atrial fibrillation, unspecified: Secondary | ICD-10-CM

## 2020-12-06 DIAGNOSIS — M5432 Sciatica, left side: Secondary | ICD-10-CM

## 2020-12-06 NOTE — Progress Notes (Signed)
Location:   [facility] Room Number: 13-A Place of Service:  ALF (13) Provider: Lennie Odor Zebulon Gantt NP  Virgie Dad, MD  Patient Care Team: Virgie Dad, MD as PCP - General (Internal Medicine)  Extended Emergency Contact Information Primary Emergency Contact: Michelle Nasuti Address: [address]          [place], [place] [address] Johnnette Litter of [place] Phone: [phone] Work Phone: [phone] Mobile Phone: [phone] Relation: Son  Code Status:  DNR Goals of care: Advanced Directive information Advanced Directives 10/21/2020  Does Patient Have a Medical Advance Directive? Yes  Type of Paramedic of [place];Out of facility DNR (pink MOST or yellow form)  Does patient want to make changes to medical advance directive? No - Patient declined  Copy of [place] in Chart? -  Would patient like information on creating a medical advance directive? -  Pre-existing out of facility DNR order (yellow form or pink MOST form) -     Chief Complaint  Patient presents with  . Acute Visit    Patient is seen acutely for constipation.     HPI:  Pt is a 87 y.o. male seen today for an acute visit for constipation   Anemia, mild, Hgb 9.9 11/11/20, at baseline. Normal Vit B12, Fe stores.              Hospitalized 09/05/20-09/09/20 for a mechanical fall at home,resulted R clavicle and right pelvis ramus fx/WBAT.Improving, working with therapy Pacemaker8/22/19,f/u [name], interrogation for PM issues.  AFib, Eliquis, CHA2DS2-VAS score >3, f/u Dr. [name], last seen 11/11/20  Amiodarone 200mg  qd HTN, takes Diltiazem,Furosemide,Bun/creat 21/1.1 10/28/20 CKDBun/creat21/1.1 10/28/20 Hyponatremia, Na 140 10/28/20 Urinary frequency, takes Tamsulosin 0.4mg  qd Prednisone 2mg  qd for chronic left sciatica pain. GERD, takes  Omeprazole  Depression/insmonia, takes Mirtazapine 7.5mg  qhs Hyperlipidemia, takes Atorvastatin 10mg  qd, LDL 75 09/27/20 Constipation, takes Colace, Senna, MiraLax. BLE edema, takes Furosemide, 2016 echocardiogram EF 55-60%   Past Medical History:  Diagnosis Date  . Ankle edema   . Anxiety   . Dysrhythmia    a-fib/flutter- on eliquis  . GERD (gastroesophageal reflux disease)    no treatment  . Hard of hearing   . 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 Hypertension   . Kidney stone   . Presence of permanent cardiac pacemaker 06/03/2020  . Spinal stenosis   . Tachy-brady syndrome ([place]) 06/03/2020   Past Surgical History:  Procedure Laterality Date  . addnoid    . CYSTOSCOPY W/ URETEROSCOPY  1980  . INSERT / REPLACE / REMOVE PACEMAKER  06/03/2020  . KNEE SURGERY    . NAILBED REPAIR Left 06/27/2016   Procedure: NAILBED biopsy left thumb;  Surgeon: Daryll Brod, MD;  Location: [facility];  Service: Orthopedics;  Laterality: Left;  FAB  . PACEMAKER IMPLANT N/A 06/03/2020   Procedure: PACEMAKER IMPLANT;  Surgeon: Constance Haw, MD;  Location: [facility];  Service: Cardiovascular;  Laterality: N/A;  . TONSILLECTOMY      No Known Allergies  Allergies as of 12/06/2020   No Known Allergies     Medication List       Accurate as of December 06, 2020 11:59 PM. If you have any questions, ask your nurse or doctor.        acetaminophen 325 MG tablet Commonly known as: TYLENOL Take 650 mg by mouth every 8 (eight) hours as needed.   amiodarone 200 MG tablet Commonly known  as: PACERONE Take 1 tablet (200 mg total) by mouth daily.   apixaban 2.5 MG Tabs tablet Commonly known as: ELIQUIS Take 1 tablet (2.5 mg total) by mouth 2 (two) times daily.   atorvastatin 10 MG tablet Commonly known as: LIPITOR Take 10 mg by mouth every evening.   cyanocobalamin 1000 MCG tablet Take 1,000 mcg by mouth every  morning.   diltiazem 120 MG tablet Commonly known as: CARDIZEM Take 120 mg by mouth daily.   furosemide 20 MG tablet Commonly known as: LASIX Take 40 mg by mouth daily.   ICAPS AREDS 2 PO Take 2 tablets by mouth 2 (two) times daily.   lactose free nutrition Liqd Take 237 mLs by mouth 2 (two) times daily between meals.   Melatonin 10 MG Caps Take 5 mg by mouth at bedtime.   mirtazapine 7.5 MG tablet Commonly known as: REMERON Take 7.5 mg by mouth at bedtime.   omeprazole 20 MG capsule Commonly known as: PRILOSEC Take 20 mg by mouth daily with breakfast.   polyethylene glycol 17 g packet Commonly known as: MIRALAX / GLYCOLAX Take 17 g by mouth daily.   potassium chloride SA 20 MEQ tablet Commonly known as: KLOR-CON Take 40 mEq by mouth daily.   predniSONE 2 MG Tbec Take 2 mg by mouth daily.   senna-docusate 8.6-50 MG tablet Commonly known as: Senokot-S Take 1 tablet by mouth daily.   tamsulosin 0.4 MG Caps capsule Commonly known as: FLOMAX Take 0.4 mg by mouth every morning.   timolol 0.5 % ophthalmic solution Commonly known as: TIMOPTIC Place 1 drop into both eyes daily.   zinc oxide 20 % ointment Apply 1 application topically as needed for irritation.       Review of Systems  Constitutional: Negative for activity change, appetite change and fever.       #& Ibs weight gained in 2 weeks.   HENT: Positive for hearing loss. Negative for congestion and voice change.        Hearing aids.   Eyes: Negative for visual disturbance.  Respiratory: Negative for cough and shortness of breath.   Cardiovascular: Positive for leg swelling. Negative for chest pain and palpitations.  Gastrointestinal: Positive for constipation. Negative for abdominal distention, abdominal pain, nausea and vomiting.       R+L inguinal hernia.   Genitourinary: Positive for frequency. Negative for dysuria.       2-3 x/night, urinary leakage uses adult brief or condom cath   Musculoskeletal: Positive for arthralgias, back pain and gait problem.       Left sciatica pain   Skin: Positive for color change.       Bruise left shoulder, arm, chest  Neurological: Positive for numbness. Negative for speech difficulty, weakness and headaches.       Feels numb in fingers, but able to make fist  Psychiatric/Behavioral: Negative for confusion and sleep disturbance. The patient is not nervous/anxious.     Immunization History  Administered Date(s) Administered  . Influenza, High Dose Seasonal PF 08/07/2016  . Influenza-Unspecified 08/04/2020  . Moderna SARS-COV2 Booster Vaccination 09/09/2020  . Moderna Sars-Covid-2 Vaccination 11/18/2019, 12/15/2019  . Pneumococcal-Unspecified 09/13/2020   Pertinent  Health Maintenance Due  Topic Date Due  . PNA vac Low Risk Adult (2 of 2 - PCV13) 09/13/2021  . INFLUENZA VACCINE  Completed   No flowsheet data found. Functional Status Survey:    Vitals:   12/06/20 1546  BP: 124/60  Pulse: 62  Resp: 18  Temp: Marland Kitchen)  97 F (36.1 C)  TempSrc: Oral  SpO2: 95%  Weight: 163 lb 12.8 oz (74.3 kg)  Height: 5\' 6"  (1.676 m)   Body mass index is 26.44 kg/m. Physical Exam Vitals and nursing note reviewed.  Constitutional:      Appearance: Normal appearance.  HENT:     Mouth/Throat:     Mouth: Mucous membranes are moist.  Eyes:     Extraocular Movements: Extraocular movements intact.     Conjunctiva/sclera: Conjunctivae normal.     Pupils: Pupils are equal, round, and reactive to light.  Cardiovascular:     Rate and Rhythm: Normal rate and regular rhythm.     Comments: Pacemaker.  Pulmonary:     Effort: Pulmonary effort is normal.     Breath sounds: No rales.  Abdominal:     General: Bowel sounds are normal. There is no distension.     Palpations: Abdomen is soft.     Tenderness: There is no abdominal tenderness. There is no right CVA tenderness, left CVA tenderness, guarding or rebound.     Hernia: A hernia is present.      Comments: R+L hermina  Musculoskeletal:     Cervical back: Normal range of motion and neck supple.     Right lower leg: Edema present.     Left lower leg: Edema present.     Comments:  Edema BLE trace now  Skin:    General: Skin is warm and dry.     Findings: No bruising or rash.  Neurological:     General: No focal deficit present.     Mental Status: He is alert and oriented to person, place, and time. Mental status is at baseline.     Motor: No weakness.     Coordination: Coordination normal.     Gait: Gait abnormal.  Psychiatric:        Mood and Affect: Mood normal.        Behavior: Behavior normal.        Thought Content: Thought content normal.        Judgment: Judgment normal.     Labs reviewed: Recent Labs    09/06/20 0145 09/08/20 0322 09/09/20 0351 09/13/20 0000 09/20/20 0000 10/28/20 0000  NA 137 133* 135 137 137 140  K 4.0 3.6 3.6 3.8 4.3 3.9  CL 104 101 101 101 102 108  CO2 23 24 25  28* 30* 26*  GLUCOSE 95 108* 96  --   --   --   BUN 19 29* 23 26* 20 21  CREATININE 1.19 1.53* 1.25* 1.1 1.2 1.1  CALCIUM 8.5* 8.5* 8.7* 8.8 8.5* 8.2*   Recent Labs    09/20/20 0000 10/28/20 0000  AST 19 15  ALT 29 13  ALKPHOS 183* 104  ALBUMIN 2.9* 3.1*   Recent Labs    09/05/20 0907 09/05/20 0913 09/06/20 0145 09/08/20 0322 09/09/20 0351 09/13/20 0000 09/20/20 0000 10/28/20 0000 11/11/20 0000  WBC 11.3*  --  9.1 10.9* 10.1   < > 6.9 7.1 7.4  NEUTROABS 8.2*  --   --   --   --   --  3,912.00 3,649.00  --   HGB 12.3*   < > 10.9* 9.5* 10.0*   < > 9.8* 9.9* 9.9*  HCT 38.2*   < > 33.3* 29.5* 30.3*   < > 30* 29* 30*  MCV 92.9  --  92.5 92.5 92.7  --   --   --   --   PLT  185  --  167 138* 180   < > 305 157 171   < > = values in this interval not displayed.   No results found for: TSH No results found for: HGBA1C Lab Results  Component Value Date   CHOL 137 09/27/2020   HDL 44 09/27/2020   LDLCALC 75 09/27/2020   TRIG 92 09/27/2020    Significant  Diagnostic Results in last 30 days:  CUP PACEART REMOTE DEVICE CHECK  Result Date: 12/02/2020 Scheduled remote reviewed. Normal device function.  Known PAF, on OAC, AF burden is 11% of the time. Next remote 91 days. Kathy Breach, RN, CCDS, [employer]   Assessment/Plan Slow transit constipation 11/15/20 change MiraLax to prn per pt, also takes Colace, Senna. No BM x 3 days, will increase Senokot S to II qd, MiraLax qd. The patient stated he just had a good BM while I am writing new orders.    Peripheral edema BLE edema, takes Furosemide, 2016 echocardiogram EF 55-60%    Hyperlipidemia Hyperlipidemia, takes Atorvastatin 10mg  qd, LDL 75 09/27/20  Insomnia secondary to depression with anxiety Depression/insmonia, takes Mirtazapine 7.5mg  qhs   GERD (gastroesophageal reflux disease) GERD, takes Omeprazole    Sciatica, left side Prednisone 2mg  qd for chronic left sciatica pain.   Urinary frequency Urinary frequency, takes Tamsulosin 0.4mg  qd  Hyponatremia Hyponatremia, Na 140 10/28/20   CKD (chronic kidney disease) stage 3, GFR 30-59 ml/min (HCC) CKDBun/creat21/1.1 10/28/20   Essential hypertension HTN, takes Diltiazem,Furosemide,Bun/creat 21/1.1 10/28/20   Atrial fibrillation, chronic (HCC) AFib, Eliquis, CHA2DS2-VAS score >3, f/u Dr. [name], last seen 11/11/20  Amiodarone 200mg  qd   Tachy-brady syndrome ([place]) Pacemaker8/22/19,f/u [name], interrogation for PM issues.    Closed traumatic minimally displaced fracture of shaft of right clavicle Hospitalized 09/05/20-09/09/20 for a mechanical fall at home,resulted R clavicle and right pelvis ramus fx/WBAT.Improving, working with therapy   Anemia Anemia, mild, Hgb 9.9 11/11/20, at baseline. Normal Vit B12, Fe stores.     Family/ staff Communication: plan of care reviewed with the patient and charge nurse.   Labs/tests ordered:  none   Time spend 40 minutes.

## 2020-12-06 NOTE — Assessment & Plan Note (Signed)
Pacemaker 07/04/18,  f/u Dr. Camnitz, interrogation for PM issues.   

## 2020-12-06 NOTE — Assessment & Plan Note (Signed)
HTN, takes Diltiazem,Furosemide,Bun/creat 21/1.1 10/28/20  

## 2020-12-06 NOTE — Assessment & Plan Note (Signed)
AFib, Eliquis, CHA2DS2-VAS score >3, f/u Dr. Ganji, last seen 11/11/20  Amiodarone 200mg qd  

## 2020-12-06 NOTE — Assessment & Plan Note (Signed)
Depression/insmonia, takes Mirtazapine 7.5mg qhs 

## 2020-12-06 NOTE — Assessment & Plan Note (Signed)
Hospitalized 09/05/20-09/09/20 for a mechanical fall at home,resulted R clavicle and right pelvis ramus fx/WBAT.Improving, working with therapy  

## 2020-12-06 NOTE — Assessment & Plan Note (Signed)
Hyponatremia, Na 140 10/28/20  

## 2020-12-06 NOTE — Assessment & Plan Note (Signed)
GERD, takes Omeprazole  

## 2020-12-06 NOTE — Assessment & Plan Note (Signed)
CKDBun/creat21/1.1 10/28/20  

## 2020-12-06 NOTE — Assessment & Plan Note (Signed)
BLE edema, takes Furosemide,2016 echocardiogram EF 55-60%   

## 2020-12-06 NOTE — Assessment & Plan Note (Signed)
Hyperlipidemia, takes Atorvastatin 10mg qd, LDL 75 09/27/20 

## 2020-12-06 NOTE — Assessment & Plan Note (Signed)
Anemia, mild, Hgb 9.9 11/11/20, at baseline. Normal Vit B12, Fe stores.  

## 2020-12-06 NOTE — Assessment & Plan Note (Signed)
Urinary frequency, takes Tamsulosin 0.4mg qd  

## 2020-12-06 NOTE — Assessment & Plan Note (Addendum)
11/15/20 change MiraLax to prn per pt, also takes Colace, Senna. No BM x 3 days, will increase Senokot S to II qd, MiraLax qd. The patient stated he just had a good BM while I am writing new orders.

## 2020-12-06 NOTE — Assessment & Plan Note (Signed)
Prednisone 2mg qd for chronic left sciatica pain. 

## 2020-12-07 ENCOUNTER — Encounter: Payer: Self-pay | Admitting: Nurse Practitioner

## 2020-12-14 ENCOUNTER — Non-Acute Institutional Stay: Payer: Medicare Other | Admitting: Nurse Practitioner

## 2020-12-14 DIAGNOSIS — K219 Gastro-esophageal reflux disease without esophagitis: Secondary | ICD-10-CM

## 2020-12-14 DIAGNOSIS — F5105 Insomnia due to other mental disorder: Secondary | ICD-10-CM

## 2020-12-14 DIAGNOSIS — E871 Hypo-osmolality and hyponatremia: Secondary | ICD-10-CM

## 2020-12-14 DIAGNOSIS — K5901 Slow transit constipation: Secondary | ICD-10-CM

## 2020-12-14 DIAGNOSIS — N1831 Chronic kidney disease, stage 3a: Secondary | ICD-10-CM

## 2020-12-14 DIAGNOSIS — S42021D Displaced fracture of shaft of right clavicle, subsequent encounter for fracture with routine healing: Secondary | ICD-10-CM | POA: Diagnosis not present

## 2020-12-14 DIAGNOSIS — F418 Other specified anxiety disorders: Secondary | ICD-10-CM

## 2020-12-14 DIAGNOSIS — R609 Edema, unspecified: Secondary | ICD-10-CM

## 2020-12-14 DIAGNOSIS — M5432 Sciatica, left side: Secondary | ICD-10-CM

## 2020-12-14 DIAGNOSIS — Z950 Presence of cardiac pacemaker: Secondary | ICD-10-CM | POA: Diagnosis not present

## 2020-12-14 DIAGNOSIS — I482 Chronic atrial fibrillation, unspecified: Secondary | ICD-10-CM

## 2020-12-14 DIAGNOSIS — R350 Frequency of micturition: Secondary | ICD-10-CM

## 2020-12-14 DIAGNOSIS — E785 Hyperlipidemia, unspecified: Secondary | ICD-10-CM

## 2020-12-14 DIAGNOSIS — I10 Essential (primary) hypertension: Secondary | ICD-10-CM

## 2020-12-14 DIAGNOSIS — D649 Anemia, unspecified: Secondary | ICD-10-CM

## 2020-12-14 DIAGNOSIS — R600 Localized edema: Secondary | ICD-10-CM

## 2020-12-15 ENCOUNTER — Encounter: Payer: Self-pay | Admitting: Nurse Practitioner

## 2020-12-15 NOTE — Assessment & Plan Note (Signed)
GERD, takes Omeprazole  

## 2020-12-15 NOTE — Assessment & Plan Note (Signed)
AFib, Eliquis, CHA2DS2-VAS score >3, f/u Dr. [name], last seen 11/11/20  Amiodarone 200mg  qd

## 2020-12-15 NOTE — Assessment & Plan Note (Signed)
Prednisone 2mg qd for chronic left sciatica pain. 

## 2020-12-15 NOTE — Assessment & Plan Note (Addendum)
abd pain with BM, not constipated since Senokot S was increased and MiraLax daily, will decrease Senokot S I daily  to eliminated abd cramps, increase MiraLax bid,  continue Colace for constipation. Observe.

## 2020-12-15 NOTE — Assessment & Plan Note (Signed)
Anemia, mild, Hgb 9.9 11/11/20, at baseline. Normal Vit B12, Fe stores.

## 2020-12-15 NOTE — Assessment & Plan Note (Signed)
CKDBun/creat21/1.1 10/28/20

## 2020-12-15 NOTE — Assessment & Plan Note (Signed)
Depression/insmonia, takes Mirtazapine 7.5mg  qhs

## 2020-12-15 NOTE — Assessment & Plan Note (Signed)
Hyponatremia, Na 140 10/28/20

## 2020-12-15 NOTE — Assessment & Plan Note (Signed)
Pacemaker 07/04/18,  f/u Dr. Camnitz, interrogation for PM issues.   

## 2020-12-15 NOTE — Assessment & Plan Note (Signed)
BLE edema, takes Furosemide,2016 echocardiogram EF 55-60%

## 2020-12-15 NOTE — Assessment & Plan Note (Signed)
Hospitalized 09/05/20-09/09/20 for a mechanical fall at home,resulted R clavicle and right pelvis ramus fx/WBAT.Improving, working with therapy

## 2020-12-15 NOTE — Progress Notes (Signed)
Location:    [facility] Room Number: 13 Place of Service:  ALF [phone]) Provider:  Marda Stalker, Lennie Odor NP  Virgie Dad, MD  Patient Care Team: Virgie Dad, MD as PCP - General (Internal Medicine)  Extended Emergency Contact Information Primary Emergency Contact: Michelle Nasuti Address: [address]          [place], [place] [address] Johnnette Litter of [place] Phone: [phone] Work Phone: [phone] Mobile Phone: [phone] Relation: Son  Code Status:   Goals of care: Advanced Directive information Advanced Directives 10/21/2020  Does Patient Have a Medical Advance Directive? Yes  Type of Paramedic of [place];Out of facility DNR (pink MOST or yellow form)  Does patient want to make changes to medical advance directive? No - Patient declined  Copy of [place] in Chart? -  Would patient like information on creating a medical advance directive? -  Pre-existing out of facility DNR order (yellow form or pink MOST form) -     Chief Complaint  Patient presents with  . Acute Visit    Abd pain when having BM    HPI:  Pt is a 87 y.o. male seen today for an acute visit for abd pain with BM, denied nausea, vomiting, rectal pain or bleeding.   Constipation, Senokot S was increased II, added MiraLax qd since 12/06/20, BM almost daily.   Anemia, mild, Hgb 9.9 11/11/20, at baseline. Normal Vit B12, Fe stores.  Hospitalized 09/05/20-09/09/20 for a mechanical fall at home,resulted R clavicle and right pelvis ramus fx/WBAT.Improving, working with therapy Pacemaker8/22/19,f/u [name], interrogation for PM issues.  AFib, Eliquis, CHA2DS2-VAS score >3, f/u Dr. [name], last seen 11/11/20  Amiodarone 200mg  qd HTN, takes Diltiazem,Furosemide,Bun/creat 21/1.1 10/28/20 CKDBun/creat21/1.1 10/28/20 Hyponatremia, Na 140  10/28/20 Urinary frequency, takes Tamsulosin 0.4mg  qd Prednisone 2mg  qd for chronic left sciatica pain. GERD, takes Omeprazole  Depression/insmonia, takes Mirtazapine 7.5mg  qhs Hyperlipidemia, takes Atorvastatin 10mg  qd, LDL 75 09/27/20 BLE edema, takes Furosemide,2016 echocardiogram EF 55-60%    Past Medical History:  Diagnosis Date  . Ankle edema   . Anxiety   . Dysrhythmia    a-fib/flutter- on eliquis  . GERD (gastroesophageal reflux disease)    no treatment  . Hard of hearing   . Heart block AV complete ([place]) 07/31/2020  . Hypertension   . Kidney stone   . Presence of permanent cardiac pacemaker 06/03/2020  . Spinal stenosis   . Tachy-brady syndrome ([place]) 06/03/2020   Past Surgical History:  Procedure Laterality Date  . addnoid    . CYSTOSCOPY W/ URETEROSCOPY  1980  . INSERT / REPLACE / REMOVE PACEMAKER  06/03/2020  . KNEE SURGERY    . NAILBED REPAIR Left 06/27/2016   Procedure: NAILBED biopsy left thumb;  Surgeon: Daryll Brod, MD;  Location: [facility];  Service: Orthopedics;  Laterality: Left;  FAB  . PACEMAKER IMPLANT N/A 06/03/2020   Procedure: PACEMAKER IMPLANT;  Surgeon: Constance Haw, MD;  Location: [facility];  Service: Cardiovascular;  Laterality: N/A;  . TONSILLECTOMY      No Known Allergies  Allergies as of 12/14/2020   No Known Allergies     Medication List       Accurate as of December 14, 2020 11:59 PM. If you have any questions, ask your nurse or doctor.        acetaminophen 325 MG tablet Commonly known as: TYLENOL Take 650 mg by mouth every 8 (eight) hours  as needed.   amiodarone 200 MG tablet Commonly known as: PACERONE Take 1 tablet (200 mg total) by mouth daily.   apixaban 2.5 MG Tabs tablet Commonly known as: ELIQUIS Take 1 tablet (2.5 mg total) by mouth 2 (two) times daily.   atorvastatin 10 MG tablet Commonly known as:  LIPITOR Take 10 mg by mouth every evening.   cyanocobalamin 1000 MCG tablet Take 1,000 mcg by mouth every morning.   diltiazem 120 MG tablet Commonly known as: CARDIZEM Take 120 mg by mouth daily.   furosemide 20 MG tablet Commonly known as: LASIX Take 40 mg by mouth daily.   ICAPS AREDS 2 PO Take 2 tablets by mouth 2 (two) times daily.   lactose free nutrition Liqd Take 237 mLs by mouth 2 (two) times daily between meals.   Melatonin 10 MG Caps Take 5 mg by mouth at bedtime.   mirtazapine 7.5 MG tablet Commonly known as: REMERON Take 7.5 mg by mouth at bedtime.   omeprazole 20 MG capsule Commonly known as: PRILOSEC Take 20 mg by mouth daily with breakfast.   polyethylene glycol 17 g packet Commonly known as: MIRALAX / GLYCOLAX Take 17 g by mouth 2 (two) times daily.   polyethylene glycol 17 g packet Commonly known as: MIRALAX / GLYCOLAX Take 17 g by mouth as needed.   potassium chloride SA 20 MEQ tablet Commonly known as: KLOR-CON Take 40 mEq by mouth daily.   predniSONE 2 MG Tbec Take 2 mg by mouth daily.   senna-docusate 8.6-50 MG tablet Commonly known as: Senokot-S Take 1 tablet by mouth daily.   tamsulosin 0.4 MG Caps capsule Commonly known as: FLOMAX Take 0.4 mg by mouth every morning.   timolol 0.5 % ophthalmic solution Commonly known as: TIMOPTIC Place 1 drop into both eyes daily.   zinc oxide 20 % ointment Apply 1 application topically as needed for irritation.       Review of Systems  Constitutional: Negative for activity change, appetite change and fever.       #& Ibs weight gained in 2 weeks.   HENT: Positive for hearing loss. Negative for congestion and voice change.        Hearing aids.   Eyes: Negative for visual disturbance.  Respiratory: Negative for cough and shortness of breath.   Cardiovascular: Positive for leg swelling. Negative for chest pain and palpitations.  Gastrointestinal: Positive for abdominal pain. Negative for  abdominal distention, constipation, nausea and vomiting.       R+L inguinal hernia. abd cramps with BM  Genitourinary: Positive for frequency. Negative for dysuria.       2-3 x/night, urinary leakage uses adult brief or condom cath  Musculoskeletal: Positive for arthralgias, back pain and gait problem.       Left sciatica pain   Skin: Negative for color change.  Neurological: Positive for numbness. Negative for speech difficulty, weakness and headaches.       Feels numb in fingers, but able to make fist  Psychiatric/Behavioral: Negative for confusion and sleep disturbance. The patient is not nervous/anxious.     Immunization History  Administered Date(s) Administered  . Influenza, High Dose Seasonal PF 08/07/2016  . Influenza-Unspecified 08/04/2020  . Moderna SARS-COV2 Booster Vaccination 09/09/2020  . Moderna Sars-Covid-2 Vaccination 11/18/2019, 12/15/2019  . Pneumococcal-Unspecified 09/13/2020   Pertinent  Health Maintenance Due  Topic Date Due  . PNA vac Low Risk Adult (2 of 2 - PCV13) 09/13/2021  . INFLUENZA VACCINE  Completed   No  flowsheet data found. Functional Status Survey:    Vitals:   12/15/20 0946  BP: 105/63  Pulse: 60  Resp: 18  Temp: (!) 97 F (36.1 C)  SpO2: 96%  Weight: 163 lb 12.8 oz (74.3 kg)  Height: 5\' 6"  (1.676 m)   Body mass index is 26.44 kg/m. Physical Exam Vitals and nursing note reviewed.  Constitutional:      Appearance: Normal appearance.  HENT:     Mouth/Throat:     Mouth: Mucous membranes are moist.  Eyes:     Extraocular Movements: Extraocular movements intact.     Conjunctiva/sclera: Conjunctivae normal.     Pupils: Pupils are equal, round, and reactive to light.  Cardiovascular:     Rate and Rhythm: Normal rate and regular rhythm.     Comments: Pacemaker.  Pulmonary:     Effort: Pulmonary effort is normal.     Breath sounds: No rales.  Abdominal:     General: Bowel sounds are normal. There is no distension.     Palpations:  Abdomen is soft.     Tenderness: There is no abdominal tenderness. There is no right CVA tenderness, left CVA tenderness, guarding or rebound.     Hernia: A hernia is present.     Comments: R+L hermina  Musculoskeletal:     Cervical back: Normal range of motion and neck supple.     Right lower leg: Edema present.     Left lower leg: Edema present.     Comments:  Edema BLE trace now  Skin:    General: Skin is warm and dry.     Findings: No bruising or rash.  Neurological:     General: No focal deficit present.     Mental Status: He is alert and oriented to person, place, and time. Mental status is at baseline.     Motor: No weakness.     Coordination: Coordination normal.     Gait: Gait abnormal.  Psychiatric:        Mood and Affect: Mood normal.        Behavior: Behavior normal.        Thought Content: Thought content normal.        Judgment: Judgment normal.     Labs reviewed: Recent Labs    09/06/20 0145 09/08/20 0322 09/09/20 0351 09/13/20 0000 09/20/20 0000 10/28/20 0000  NA 137 133* 135 137 137 140  K 4.0 3.6 3.6 3.8 4.3 3.9  CL 104 101 101 101 102 108  CO2 23 24 25  28* 30* 26*  GLUCOSE 95 108* 96  --   --   --   BUN 19 29* 23 26* 20 21  CREATININE 1.19 1.53* 1.25* 1.1 1.2 1.1  CALCIUM 8.5* 8.5* 8.7* 8.8 8.5* 8.2*   Recent Labs    09/20/20 0000 10/28/20 0000  AST 19 15  ALT 29 13  ALKPHOS 183* 104  ALBUMIN 2.9* 3.1*   Recent Labs    09/05/20 0907 09/05/20 0913 09/06/20 0145 09/08/20 0322 09/09/20 0351 09/13/20 0000 09/20/20 0000 10/28/20 0000 11/11/20 0000  WBC 11.3*  --  9.1 10.9* 10.1   < > 6.9 7.1 7.4  NEUTROABS 8.2*  --   --   --   --   --  3,912.00 3,649.00  --   HGB 12.3*   < > 10.9* 9.5* 10.0*   < > 9.8* 9.9* 9.9*  HCT 38.2*   < > 33.3* 29.5* 30.3*   < > 30* 29* 30*  MCV 92.9  --  92.5 92.5 92.7  --   --   --   --   PLT 185  --  167 138* 180   < > 305 157 171   < > = values in this interval not displayed.   No results found for:  TSH No results found for: HGBA1C Lab Results  Component Value Date   CHOL 137 09/27/2020   HDL 44 09/27/2020   LDLCALC 75 09/27/2020   TRIG 92 09/27/2020    Significant Diagnostic Results in last 30 days:  CUP PACEART REMOTE DEVICE CHECK  Result Date: 12/02/2020 Scheduled remote reviewed. Normal device function.  Known PAF, on OAC, AF burden is 11% of the time. Next remote 91 days. Kathy Breach, RN, CCDS, [employer]   Assessment/Plan There are no diagnoses linked to this encounter.   Family/ staff Communication:   Labs/tests ordered:

## 2020-12-15 NOTE — Assessment & Plan Note (Signed)
Urinary frequency, takes Tamsulosin 0.4mg qd  

## 2020-12-15 NOTE — Assessment & Plan Note (Signed)
HTN, takes Diltiazem,Furosemide,Bun/creat 21/1.1 10/28/20

## 2020-12-15 NOTE — Assessment & Plan Note (Signed)
Hyperlipidemia, takes Atorvastatin 10mg qd, LDL 75 09/27/20 

## 2020-12-15 NOTE — Progress Notes (Signed)
Remote pacemaker transmission.   

## 2021-01-10 ENCOUNTER — Telehealth: Payer: Self-pay | Admitting: *Deleted

## 2021-01-10 DIAGNOSIS — I480 Paroxysmal atrial fibrillation: Secondary | ICD-10-CM

## 2021-01-10 MED ORDER — AMIODARONE HCL 200 MG PO TABS: 200.0000 mg | ORAL_TABLET | Freq: Every day | ORAL | 0 refills | Status: DC

## 2021-01-10 MED ORDER — FUROSEMIDE 20 MG PO TABS: 40.0000 mg | ORAL_TABLET | Freq: Every day | ORAL | 0 refills | Status: DC

## 2021-01-10 NOTE — Telephone Encounter (Signed)
Mikki Santee, son, called and left message on Clinical intake stating that patient is being discharged from [facility] today. Stated that he is needing a Refill on his Amiodarone 200mg  and Furosemide 40mg .  Stated that [employer] told him to call us for refills.   Stated that his PCP is with [facility] and appointment is next week.  Wants Rx's sent to [facility].   Tried calling son due to Rx's to go through facility but no answer and he stated that he only had 2 days worth.

## 2021-01-10 NOTE — Telephone Encounter (Signed)
Mast, Man X, NP  You 8 minutes ago (4:00 PM)    I am not aware of his discharge, but okay to refill his Amiodarone and Furosemide. Thanks.    Message text

## 2021-01-11 NOTE — Telephone Encounter (Signed)
Yes, refills was sent in for patient yesterday. Thanks.

## 2021-01-11 NOTE — Telephone Encounter (Signed)
Wanted to make sure Refill was filled for him. Social worker had called me about this He is going to follow with his Cardiology and his PCP going forward. Thanks

## 2021-01-17 ENCOUNTER — Other Ambulatory Visit: Payer: Self-pay

## 2021-01-17 ENCOUNTER — Encounter: Payer: Self-pay | Admitting: Dermatology

## 2021-01-17 ENCOUNTER — Ambulatory Visit: Payer: Medicare Other | Admitting: Dermatology

## 2021-01-17 DIAGNOSIS — L821 Other seborrheic keratosis: Secondary | ICD-10-CM | POA: Diagnosis not present

## 2021-01-17 DIAGNOSIS — L820 Inflamed seborrheic keratosis: Secondary | ICD-10-CM | POA: Diagnosis not present

## 2021-01-17 DIAGNOSIS — D485 Neoplasm of uncertain behavior of skin: Secondary | ICD-10-CM

## 2021-01-17 NOTE — Patient Instructions (Signed)

## 2021-02-02 ENCOUNTER — Other Ambulatory Visit: Payer: Self-pay | Admitting: Nurse Practitioner

## 2021-02-02 ENCOUNTER — Other Ambulatory Visit: Payer: Self-pay | Admitting: Cardiology

## 2021-02-02 ENCOUNTER — Other Ambulatory Visit: Payer: Self-pay

## 2021-02-02 ENCOUNTER — Encounter: Payer: Self-pay | Admitting: Dermatology

## 2021-02-02 DIAGNOSIS — I10 Essential (primary) hypertension: Secondary | ICD-10-CM

## 2021-02-02 DIAGNOSIS — I480 Paroxysmal atrial fibrillation: Secondary | ICD-10-CM

## 2021-02-02 MED ORDER — AMIODARONE HCL 200 MG PO TABS: 200.0000 mg | ORAL_TABLET | Freq: Every day | ORAL | 0 refills | Status: DC

## 2021-02-02 MED ORDER — DILTIAZEM HCL 120 MG PO TABS: 120.0000 mg | ORAL_TABLET | Freq: Every day | ORAL | 3 refills | Status: DC

## 2021-02-02 NOTE — Progress Notes (Signed)
   Follow-Up Visit   Subjective  Stephen Cuevas is a 87 y.o. male who presents for the following: Skin Problem (Right cheek fast growing KA ).  Bloody crust on right side of neck.  Sent with the patient throughout visit. Location:  Duration:  Quality:  Associated Signs/Symptoms: Modifying Factors:  Severity:  Timing: Context:   Objective  Well appearing patient in no apparent distress; mood and affect are within normal limits. Objective  Right Anterior Neck: 8, hemorrhagic.  Crusted 1 cm nodule suggestive of squamous cell carcinoma, but I cannot absolutely exclude an inflamed keratosis.     Objective  Left Temple, Mid Back, Right Forehead: Head, neck, back, upper chest examined: Rev. Doren Custard has the most remarkable number of clinically benign keratoses.    A focused examination was performed including Head, neck, regional lymph nodes, back, upper chest.. Relevant physical exam findings are noted in the Assessment and Plan.   Assessment & Plan    Neoplasm of uncertain behavior of skin Right Anterior Neck  Skin / nail biopsy Type of biopsy: tangential   Informed consent: discussed and consent obtained   Timeout: patient name, date of birth, surgical site, and procedure verified   Anesthesia: the lesion was anesthetized in a standard fashion   Anesthetic:  1% lidocaine w/ epinephrine 1-100,000 local infiltration Instrument used: flexible razor blade   Hemostasis achieved with: aluminum chloride and electrodesiccation   Outcome: patient tolerated procedure well   Post-procedure details: wound care instructions given    Destruction of lesion Complexity: simple   Destruction method: electrodesiccation and curettage   Informed consent: discussed and consent obtained   Timeout:  patient name, date of birth, surgical site, and procedure verified Anesthesia: the lesion was anesthetized in a standard fashion   Anesthetic:  1% lidocaine w/ epinephrine 1-100,000 local  infiltration Curettage performed in three different directions: Yes   Electrodesiccation performed over the curetted area: Yes   Curettage cycles:  3 Lesion length (cm):  1.5 Lesion width (cm):  1.5 Margin per side (cm):  0 Final wound size (cm):  1.5 Hemostasis achieved with:  aluminum chloride Outcome: patient tolerated procedure well with no complications   Post-procedure details: wound care instructions given    Specimen 1 - Surgical pathology Differential Diagnosis: KA CURET AND CAUTERY  Check Margins: No  After shave biopsy there was no obvious deep extension, but the base was treated with curettage plus electrocautery.  Seborrheic keratosis (3) Mid Back; Left Temple; Right Forehead  Leave if stable.      I, Lavonna Monarch, MD, have reviewed all documentation for this visit.  The documentation on 02/02/21 for the exam, diagnosis, procedures, and orders are all accurate and complete.

## 2021-02-03 NOTE — Telephone Encounter (Signed)
This patients PCP is not listed as one of our providers. I will send to [facility] and her medical assistant Mickey Farber to review and approve if necessary   We need to change PCP to one of our providers, if this patient is under our care.

## 2021-02-03 NOTE — Telephone Encounter (Signed)
Again, if this patient is going to be a permanent resident please advise and/or change the PCP to avoid this question in the future

## 2021-02-03 NOTE — Telephone Encounter (Signed)
[facility] has [name] listed as their PCP.

## 2021-02-08 NOTE — Progress Notes (Signed)
Primary Physician/Referring:  [name], DO  Patient ID: Stephen Cuevas, male    DOB: 10-24-1926, 87 y.o.   MRN: [id number]  Chief Complaint  Patient presents with  . Hypertension  . Atrial Fibrillation  . Follow-up    3 months   HPI:    Stephen Cuevas  is a 87 y.o. Caucasian male who resides at [facility] assisted living facility and has history of hyperlipidemia, hypertension, sick sinus syndrome with paroxysmal atrial fibrillation, complete heart block who underwent permanent pacemaker implantation on 06/04/2020 for marked dyspnea, fatigue and dizziness.   Patient had a fall and admitted on 09/05/2020 to the hospital after an accidental fall and had right clavicle fracture and pubic ramus fracture, recommended physical therapy and also stabilization with WBAT.   Patient presents for 32-monthfollow-up, accompanied by his daughter-in-law Stephen Cuevas of atrial fibrillation, hypertension, and sick sinus syndrome.  Last visit reduced amiodarone from 200 mg twice daily to 200 mg once daily.Patient is presently doing well from a cardiovascular standpoint.  Patient moved back into independent living from assisted living approximately 3 to 4 weeks ago and since then he has had more back, leg, and hip pain.  He recently had a steroid injection in his back, which has improved his pain some.  However patient reports he is planning to undergo spinal injection by Dr. RHerma Cuevas April 14.  Dr. RVashti Cuevas requested patient hold his Eliquis for 3 days prior to the injection.  Patient denies chest pain, palpitations, dizziness, syncope, near syncope.  He denies PND, orthopnea.  He continues to have minimal bilateral lower leg swelling, worse at the end of the day.  He also notes continued dyspnea on exertion which is stable.  His fatigue and shortness of breath have improved significantly since last visit.  Past Medical History:  Diagnosis Date  . Ankle edema   . Anxiety   . Basal cell carcinoma  11/21/2004   MID FOREHEAD  . BCC (basal cell carcinoma of skin) 08/26/2007   (LEFT OUTER, ZYGOMATIC ARCH)(CURET3, EXC)  . Dysrhythmia    a-fib/flutter- on eliquis  . GERD (gastroesophageal reflux disease)    no treatment  . Hard of hearing   . Heart block AV complete (Stephen Cuevas 07/31/2020  . Hypertension   . Kidney stone   . Presence of permanent cardiac pacemaker 06/03/2020  . SCC (squamous cell carcinoma) 07/17/2008   SCC WELL DIFF (RIGHT HELICAL MARGIN)(MOHS)  . SCC (squamous cell carcinoma) 03/24/2013   SCC IN SITU (BACK SCALP)(TX AFTER BX)  . SCC (squamous cell carcinoma) 03/24/2013   SCC IN SITU (RIGHT SCALP)(TX AFTER BX)  . SCC (squamous cell carcinoma) 03/24/2013   SCC IN SITU (MID SCALP)(TX AFTER BX)  . SCC (squamous cell carcinoma) 03/24/2013   SCC IN SITU (MID NECK)(TX AFTER BX)  . Spinal stenosis   . Squamous cell carcinoma of skin 11/25/1997   ant right scalp   . [place] 06/03/[place]   Past Surgical History:  Procedure Laterality Date  . addnoid    . CYSTOSCOPY W/ URETEROSCOPY  1980  . INSERT / REPLACE / REMOVE PACEMAKER  06/03/2020  . KNEE SURGERY    . NAILBED REPAIR Left 06/27/2016   Procedure: NAILBED biopsy left thumb;  Surgeon: GDaryll Brod MD;  [place] [facility] [facility]  Service: Orthopedics;  Laterality: Left;  FAB  . PACEMAKER IMPLANT N/A 06/03/2020   Procedure: PACEMAKER IMPLANT;  Surgeon: CConstance Haw MD;  [facility];  Service: Cardiovascular;  Laterality: N/A;  . TONSILLECTOMY     Family History  Problem Relation Age of Onset  . Heart failure Father   . Heart disease Father     Social History   Tobacco Use  . Smoking status: Former Smoker    Packs/day: 1.50    Years: 20.00    Pack years: 30.00    Types: Cigarettes, Pipe, Cigars    Quit date: 03/13/1956    Years since quitting: 64.9  . Smokeless tobacco: Never Used  Substance Use Topics  . Alcohol use: Yes    Comment: daily   Marital Status:  Widowed ROS  Review of Systems  Constitutional: Negative for malaise/fatigue.  Cardiovascular: Positive for leg swelling (mild, bilateral). Negative for chest pain, dyspnea on exertion, near-syncope, orthopnea, palpitations and syncope.  Musculoskeletal: Positive for arthritis, back pain and joint pain (bilateral hip ).  Gastrointestinal: Negative for melena.  Neurological: Negative for dizziness.   Objective  Blood pressure 108/68, pulse 60, temperature 98.7 F (37.1 C), temperature source Temporal, resp. rate 16, height '5\' 6"'  (1.676 m), weight 161 lb (73 kg), SpO2 98 %.  Vitals with BMI 02/09/2021 12/15/2020 12/06/2020  Height '5\' 6"'  '5\' 6"'  '5\' 6"'   Weight 161 lbs 163 lbs 13 oz 163 lbs 13 oz  BMI 26 85.63 14.97  Systolic 026 378 588  Diastolic 68 63 60  Pulse 60 60 62     Physical Exam Constitutional:      Appearance: Normal appearance.  HENT:     Head: Atraumatic.  Cardiovascular:     Rate and Rhythm: Regular rhythm. Bradycardia present.     Pulses: Intact distal pulses.          Carotid pulses are 2+ on the right side and 2+ on the left side.      Popliteal pulses are 2+ on the right side and 2+ on the left side.       Dorsalis pedis pulses are 2+ on the right side and 2+ on the left side.       Posterior tibial pulses are 1+ on the right side and 1+ on the left side.     Heart sounds: S1 normal and S2 normal. Murmur heard.   Early systolic murmur is present with a grade of 2/6 at the upper right sternal border. No gallop.      Comments: No JVD.   Pulmonary:     Effort: Pulmonary effort is normal.     Breath sounds: Normal breath sounds.  Musculoskeletal:     Right lower leg: Edema (minimal) present.     Left lower leg: Edema (minimal) present.  Skin:    General: Skin is warm and dry.     Capillary Refill: Capillary refill takes less than 2 seconds.  Neurological:     General: No focal deficit present.     Mental Status: He is alert and oriented to person, place, and time.     Laboratory examination:   CMP Latest Ref Rng & Units 10/28/2020 09/20/2020 09/13/2020  Glucose 70 - 99 mg/dL - - -  BUN 4 - '21 21 20 ' 26(A)  Creatinine 0.6 - 1.3 1.1 1.2 1.1  Sodium 137 - 147 140 137 137  Potassium 3.4 - 5.3 3.9 4.3 3.8  Chloride 99 - 108 108 102 101  CO2 13 - 22 26(A) 30(A) 28(A)  Calcium 8.7 - 10.7 8.2(A) 8.5(A) 8.8  Total Protein 6.0 - 8.3 g/dL - - -  Total Bilirubin 0.3 - 1.2 mg/dL - - -  Alkaline Phos 25 - 125 104 183(A) -  AST 14 - 40 15 19 -  ALT 10 - 40 13 29 -   CBC Latest Ref Rng & Units 11/11/2020 10/28/2020 09/20/2020  WBC - 7.4 7.1 6.9  Hemoglobin 13.5 - 17.5 9.9(A) 9.9(A) 9.8(A)  Hematocrit 41 - 53 30(A) 29(A) 30(A)  Platelets 150 - 399 171 157 305   External labs:  02/16/2020: Serum glucose 105 mg, BUN 22, creatinine 1.7, EGFR 37.9 mL, potassium 4.7. Hb 12.3/HCT 40.9, platelets 190. Total cholesterol 162, triglycerides 70, HDL 63, LDL 85.  Non-HDL cholesterol 99. PSA normal   Medications and allergies  No Known Allergies   Current Outpatient Medications on File Prior to Visit  Medication Sig Dispense Refill  . acetaminophen (TYLENOL) 325 MG tablet Take 650 mg by mouth every 8 (eight) hours as needed.    Marland Kitchen amiodarone (PACERONE) 200 MG tablet TAKE 1 TABLET BY MOUTH EVERY DAY 30 tablet 0  . apixaban (ELIQUIS) 2.5 MG TABS tablet Take 2.5 mg by mouth 2 (two) times daily.    Marland Kitchen atorvastatin (LIPITOR) 10 MG tablet Take 10 mg by mouth every evening.   12  . cyanocobalamin 1000 MCG tablet Take 1,000 mcg by mouth every morning.    . diltiazem (CARDIZEM) 120 MG tablet Take 1 tablet (120 mg total) by mouth daily. 90 tablet 3  . furosemide (LASIX) 20 MG tablet TAKE 2 TABLETS (40 MG TOTAL) BY MOUTH DAILY. 60 tablet 0  . lactose free nutrition (BOOST) LIQD Take 237 mLs by mouth 2 (two) times daily between meals.    . Melatonin 10 MG CAPS Take 5 mg by mouth at bedtime.     . mirtazapine (REMERON) 7.5 MG tablet Take 7.5 mg by mouth at bedtime.     .  Multiple Vitamins-Minerals (ICAPS AREDS 2 PO) Take 2 tablets by mouth 2 (two) times daily.    Marland Kitchen omeprazole (PRILOSEC) 20 MG capsule Take 20 mg by mouth daily with breakfast.     . potassium chloride SA (KLOR-CON) 20 MEQ tablet Take 40 mEq by mouth daily.    . predniSONE 2 MG TBEC Take 2 mg by mouth daily.    Marland Kitchen senna-docusate (SENOKOT-S) 8.6-50 MG tablet Take 1 tablet by mouth daily.    . tamsulosin (FLOMAX) 0.4 MG CAPS capsule Take 0.4 mg by mouth every morning.    . timolol (TIMOPTIC) 0.5 % ophthalmic solution Place 1 drop into both eyes daily.     No current facility-administered medications on file prior to visit.    Lisinopril on hold since 02/16/19 per PCP  Radiology:   Chest x-ray PA and lateral view 02/16/2020: Mild hyperextension, patchy right basilar infiltrate, borderline cardiomegaly, atherosclerosis, thoracic levoscoliosis and spondylosis.  Cardiac Studies:   Echocardiogram 02/26/2020:  Normal LV systolic function with visual EF 60-65%. Left ventricle cavity is normal in size. Mild left ventricular hypertrophy. Normal global wall  motion. Doppler evidence of grade II diastolic dysfunction. Calculated EF 66%.  Left atrial cavity is severely dilated, 34m/m2.  Right atrial cavity is dilated.  No evidence of aortic stenosis. Mild aortic regurgitation.  Mild to moderate mitral regurgitation.  Moderate tricuspid regurgitation. Moderate pulmonary hypertension. RVSP measures 51 mmHg.  IVC is dilated with a respiratory response of <50%.  No prior study for comparison.  Event monitor 04/27/2020 through 05/17/2020: Patient had paroxysmal episodes of atrial fibrillation with 4% atrial fibrillation burden. Predominant rhythm was sinus  rhythm with nonconducted P waves and 1: 1 Mobitz 2 AV  block at 10:21 PM on 05/10/2020. Patient also had a 3.4-second ventricular standstill at 10:35 PM on 05/04/2020   [place] MRI Dual Chamber pacemaker 06/03/2018     Scheduled  In office pacemaker check 10/28/20  Single (S)/Dual (D)/BV: D. Presenting AFVS. Pacemaker dependant:  No. Underlying AF with VR 80-100, irregular. AP 16%, VP 12%.  AMS Episodes 4178.  AT/AF burden 21% . Longest 1D7H. Latest Ongoing since 8AM today. HVR 1. Longest 9Sec, 28 beat NSVT @ 192/min.   Longevity 9.6-11.1 Years. Magnet rate: >85%. Lead measurements: Stable.Marland Kitchen Histogram: Low (L)/normal (N)/high (H)  Normal. Patient activity Decreased since Oct 2021.   Observations: Normal pacemaker function.  Frequent episodes of atrial fibrillation, since September 02, 2020, A. fib burden has been ranging from 17 up to 48%. Changes: None.    EKG   EKG 02/09/2021: Sinus rhythm with first-degree AV block at a rate of 60 bpm.  Left atrial abnormality.  Left axis, left anterior fascicular block.  Right bundle branch block.  Nonspecific T wave abnormality.  EKG 11/11/2020: Atrial paced rhythm with underlying sinus bradycardia with first-degree AV block at a rate of 62 bpm.  Left axis, left anterior fascicular block.  Right bundle branch block. Biventricular block  EKG 06/02/2020: Underlying sinus bradycardia at rate of 30 bpm with complete heart block and junctional escape, frequent PACs in bigeminal pattern that appeared to be conducted.  Left axis deviation, left anterior fascicular block.  Right bundle branch block.   EKG 03/29/2020: Sinus rhythm with first-degree block at rate of 83 bpm, leftward enlargement, left axis deviation, left intrafascicular block.  Right bundle branch block.  Bifascicular block.  Low-voltage complexes.  Nonspecific T abnormality.  EKG 03/22/2020, PCP EKG.  Sinus tachycardia at rate of 100 bpm with 2: 1 AV conduction, Mobitz 2 AV block 1: 1.  Left axis deviation, left anterior fascicular block.  Right bundle branch block.  02/20/2020: Sinus bradycardia with first-degree AV block at the rate of 52 bpm, left axis deviation, left anterior fascicular block.  Right  bundle branch block.  Trifascicular block.  Low-voltage complexes.  No evidence of ischemia.  02/20/2020: Atrial fibrillation with controlled ventricular response at rate of 49 bpm, left axis deviation, left anterior fascicular block.  Right bundle branch block.  Nonspecific T abnormality.    Assessment     ICD-10-CM   1. Paroxysmal atrial fibrillation (HCC)  I48.0 EKG 12-Lead  2. Essential hypertension  I10   3. Tachycardia-bradycardia syndrome ([place])  I49.5     CHA2DS2-VASc Score is 3.  Yearly risk of stroke: 3.2% (A, HTN).  No orders of the defined types were placed in this encounter.   Medications Discontinued During This Encounter  Medication Reason  . apixaban (ELIQUIS) 2.5 MG TABS tablet Error  . polyethylene glycol (MIRALAX / GLYCOLAX) 17 g packet Error  . polyethylene glycol (MIRALAX / GLYCOLAX) 17 g packet Error  . zinc oxide 20 % ointment Error    Recommendations:   Stephen Stephen Cuevas  is a 65 y.o. Caucasian male who resides at [facility] assisted living facility and has history of hyperlipidemia, hypertension, sick sinus syndrome with paroxysmal atrial fibrillation, complete heart block who underwent permanent pacemaker implantation on 06/04/2020 for marked dyspnea, fatigue and dizziness.   Patient had a fall and admitted on 09/05/2020 to the hospital after an accidental fall and had right clavicle fracture and pubic ramus  fracture, recommended physical therapy and also stabilization with WBAT  Patient presents for 78-monthfollow-up of atrial fibrillation, hypertension, and sick sinus syndrome.  Last visit reduce amiodarone from 200 mg twice daily to 200 mg once daily.  Patient is presently doing well from a cardiovascular standpoint without clinical signs of heart failure.  He has not returned to independent living, however his primary concern since this move has been increased back and hip pain.  Patient's blood pressure is well controlled.  Will not make changes to his  medications at this time.  As patient is presently being treated with amiodarone, they will need annual monitoring of PFTs, thyroid function, liver function, and ophthalmologic exam. Patient is aware of these monitoring parameters and agrees.  Discussed with patient regarding risks versus benefits of holding Eliquis for 3 days prior to spinal injection.  Both patient and his daughter-in-law verbalized understanding of risks and benefits.  Patient's CHA2DS2-VASc score is 3, and back pain is significantly limiting his quality of life.  Recommend holding Eliquis for 3 days and proceeding with spinal injection with Dr. RLanae Cuevas  Follow-up in 6 months, sooner if needed, for sick sinus syndrome, atrial fibrillation.   CAlethia Berthold PA-C 02/09/2021, 12:23 PM Office: [phone]

## 2021-02-09 ENCOUNTER — Other Ambulatory Visit: Payer: Self-pay

## 2021-02-09 ENCOUNTER — Encounter: Payer: Self-pay | Admitting: Student

## 2021-02-09 ENCOUNTER — Ambulatory Visit: Payer: Medicare Other | Admitting: Student

## 2021-02-09 VITALS — BP 108/68 | HR 60 | Temp 98.7°F | Resp 16 | Ht 66.0 in | Wt 161.0 lb

## 2021-02-09 DIAGNOSIS — I495 Sick sinus syndrome: Secondary | ICD-10-CM

## 2021-02-09 DIAGNOSIS — I10 Essential (primary) hypertension: Secondary | ICD-10-CM

## 2021-02-09 DIAGNOSIS — I480 Paroxysmal atrial fibrillation: Secondary | ICD-10-CM

## 2021-02-11 ENCOUNTER — Encounter: Payer: Self-pay | Admitting: Cardiology

## 2021-02-25 ENCOUNTER — Other Ambulatory Visit: Payer: Self-pay | Admitting: Nurse Practitioner

## 2021-02-25 DIAGNOSIS — I480 Paroxysmal atrial fibrillation: Secondary | ICD-10-CM

## 2021-02-28 NOTE — Telephone Encounter (Signed)
Is this going to be our patient please change PCP if so.

## 2021-03-03 ENCOUNTER — Ambulatory Visit (INDEPENDENT_AMBULATORY_CARE_PROVIDER_SITE_OTHER): Payer: Medicare Other

## 2021-03-03 DIAGNOSIS — I442 Atrioventricular block, complete: Secondary | ICD-10-CM

## 2021-03-03 LAB — CUP PACEART REMOTE DEVICE CHECK
Battery Remaining Longevity: 112 mo
Battery Remaining Percentage: 95.5 %
Battery Voltage: 3.02 V
Brady Statistic AP VP Percent: 14 %
Brady Statistic AP VS Percent: 43 %
Brady Statistic AS VP Percent: 4.1 %
Brady Statistic AS VS Percent: 39 %
Brady Statistic RA Percent Paced: 52 %
Brady Statistic RV Percent Paced: 21 %
Date Time Interrogation Session: 20220421020015
Implantable Lead Implant Date: 20210722
Implantable Lead Implant Date: 20210722
Implantable Lead Location: 753859
Implantable Lead Location: 753860
Implantable Pulse Generator Implant Date: 20210722
Lead Channel Impedance Value: 410 Ohm
Lead Channel Impedance Value: 480 Ohm
Lead Channel Pacing Threshold Amplitude: 0.875 V
Lead Channel Pacing Threshold Amplitude: 1 V
Lead Channel Pacing Threshold Pulse Width: 0.4 ms
Lead Channel Pacing Threshold Pulse Width: 0.4 ms
Lead Channel Sensing Intrinsic Amplitude: 3.3 mV
Lead Channel Sensing Intrinsic Amplitude: 6.1 mV
Lead Channel Setting Pacing Amplitude: 2 V
Lead Channel Setting Pacing Amplitude: 2.5 V
Lead Channel Setting Pacing Pulse Width: 0.4 ms
Lead Channel Setting Sensing Sensitivity: 2 mV
Pulse Gen Model: 2272
Pulse Gen Serial Number: 3851047

## 2021-03-21 NOTE — Progress Notes (Signed)
Remote pacemaker transmission.   

## 2021-03-22 ENCOUNTER — Other Ambulatory Visit: Payer: Self-pay | Admitting: Nurse Practitioner

## 2021-03-22 DIAGNOSIS — I480 Paroxysmal atrial fibrillation: Secondary | ICD-10-CM

## 2021-04-24 ENCOUNTER — Other Ambulatory Visit: Payer: Self-pay | Admitting: Nurse Practitioner

## 2021-04-24 DIAGNOSIS — I480 Paroxysmal atrial fibrillation: Secondary | ICD-10-CM

## 2021-04-25 NOTE — Telephone Encounter (Signed)
Patient needs to call to confirm PCP

## 2021-04-29 ENCOUNTER — Other Ambulatory Visit: Payer: Self-pay | Admitting: Cardiology

## 2021-04-29 ENCOUNTER — Other Ambulatory Visit: Payer: Self-pay

## 2021-04-29 DIAGNOSIS — I480 Paroxysmal atrial fibrillation: Secondary | ICD-10-CM

## 2021-04-29 MED ORDER — APIXABAN 2.5 MG PO TABS: 2.5000 mg | ORAL_TABLET | Freq: Two times a day (BID) | ORAL | 1 refills | Status: AC

## 2021-04-29 MED ORDER — AMIODARONE HCL 200 MG PO TABS: 200.0000 mg | ORAL_TABLET | Freq: Every day | ORAL | 1 refills | Status: DC

## 2021-06-02 ENCOUNTER — Ambulatory Visit (INDEPENDENT_AMBULATORY_CARE_PROVIDER_SITE_OTHER): Payer: Medicare Other

## 2021-06-02 DIAGNOSIS — I495 Sick sinus syndrome: Secondary | ICD-10-CM | POA: Diagnosis not present

## 2021-06-02 LAB — CUP PACEART REMOTE DEVICE CHECK
Battery Remaining Longevity: 109 mo
Battery Remaining Percentage: 94 %
Battery Voltage: 3.02 V
Brady Statistic AP VP Percent: 14 %
Brady Statistic AP VS Percent: 37 %
Brady Statistic AS VP Percent: 3 %
Brady Statistic AS VS Percent: 46 %
Brady Statistic RA Percent Paced: 47 %
Brady Statistic RV Percent Paced: 20 %
Date Time Interrogation Session: 20220721020013
Implantable Lead Implant Date: 20210722
Implantable Lead Implant Date: 20210722
Implantable Lead Location: 753859
Implantable Lead Location: 753860
Implantable Pulse Generator Implant Date: 20210722
Lead Channel Impedance Value: 430 Ohm
Lead Channel Impedance Value: 450 Ohm
Lead Channel Pacing Threshold Amplitude: 0.875 V
Lead Channel Pacing Threshold Amplitude: 1 V
Lead Channel Pacing Threshold Pulse Width: 0.4 ms
Lead Channel Pacing Threshold Pulse Width: 0.4 ms
Lead Channel Sensing Intrinsic Amplitude: 1.7 mV
Lead Channel Sensing Intrinsic Amplitude: 6 mV
Lead Channel Setting Pacing Amplitude: 2 V
Lead Channel Setting Pacing Amplitude: 2.5 V
Lead Channel Setting Pacing Pulse Width: 0.4 ms
Lead Channel Setting Sensing Sensitivity: 2 mV
Pulse Gen Model: 2272
Pulse Gen Serial Number: 3851047

## 2021-06-24 NOTE — Progress Notes (Signed)
Remote pacemaker transmission.   

## 2021-07-28 ENCOUNTER — Non-Acute Institutional Stay: Payer: Medicare Other | Admitting: Internal Medicine

## 2021-07-28 ENCOUNTER — Encounter: Payer: Self-pay | Admitting: Internal Medicine

## 2021-07-28 DIAGNOSIS — I480 Paroxysmal atrial fibrillation: Secondary | ICD-10-CM | POA: Diagnosis not present

## 2021-07-28 DIAGNOSIS — M5442 Lumbago with sciatica, left side: Secondary | ICD-10-CM

## 2021-07-28 DIAGNOSIS — N1831 Chronic kidney disease, stage 3a: Secondary | ICD-10-CM

## 2021-07-28 DIAGNOSIS — I495 Sick sinus syndrome: Secondary | ICD-10-CM

## 2021-07-28 DIAGNOSIS — K5901 Slow transit constipation: Secondary | ICD-10-CM

## 2021-07-28 DIAGNOSIS — G8929 Other chronic pain: Secondary | ICD-10-CM

## 2021-07-28 DIAGNOSIS — I10 Essential (primary) hypertension: Secondary | ICD-10-CM

## 2021-07-28 DIAGNOSIS — F418 Other specified anxiety disorders: Secondary | ICD-10-CM

## 2021-07-28 DIAGNOSIS — F5105 Insomnia due to other mental disorder: Secondary | ICD-10-CM

## 2021-07-28 DIAGNOSIS — D649 Anemia, unspecified: Secondary | ICD-10-CM

## 2021-07-28 DIAGNOSIS — E785 Hyperlipidemia, unspecified: Secondary | ICD-10-CM

## 2021-07-28 DIAGNOSIS — N401 Enlarged prostate with lower urinary tract symptoms: Secondary | ICD-10-CM

## 2021-07-28 DIAGNOSIS — K219 Gastro-esophageal reflux disease without esophagitis: Secondary | ICD-10-CM

## 2021-07-28 DIAGNOSIS — R350 Frequency of micturition: Secondary | ICD-10-CM

## 2021-07-28 NOTE — Progress Notes (Signed)
Provider:  Veleta Miners MD  Location:   [facility] Room Number: 27 Place of Service:  ALF [phone])  PCP: Virgie Dad, MD Patient Care Team: Virgie Dad, MD as PCP - General (Internal Medicine) Lavonna Monarch, MD as Consulting Physician (Dermatology)  Extended Emergency Contact Information Primary Emergency Contact: Michelle Nasuti Address: [address]          [place], [place] [address] Johnnette Litter of [place] Phone: [phone] Work Phone: [phone] Mobile Phone: [phone] Relation: Son Secondary Emergency Contact: Myron,Chris Mobile Phone: [phone] Relation: Son Preferred language: English  Code Status: DNR Goals of Care: Advanced Directive information Advanced Directives 07/28/2021  Does Patient Have a Medical Advance Directive? Yes  Type of Paramedic of [place];Living will;Out of facility DNR (pink MOST or yellow form)  Does patient want to make changes to medical advance directive? No - Patient declined  Copy of [place] in Chart? Yes - validated most recent copy scanned in chart (See row information)  Would patient like information on creating a medical advance directive? -  Pre-existing out of facility DNR order (yellow form or pink MOST form) Yellow form placed in chart (order not valid for inpatient use)      Chief Complaint  Patient presents with   Acute Visit    HPI: Patient is a 87 y.o. male seen today for admission to AL  He has h/o  Patient has a history of A. fib on Eliquis, hypertension, hyperlipidemia,   right clavicle and right pubic ramus fracture in 10/21 H/o Tachybrady s/p Pacemaker  Also h/o Chronic Pain in Lower Back and Left Hip  ON Chronic Prednisone  Patient has been having issues with Managing by himself in his Apartment Especially with the pain in his back and Left Hip and Knee Unable to take care of himself He is now in AL. His main c/o Pain in  his leg. He is Planning to see Dr Alvan Dame today He had many Injections per patient . He get some relief but the pain comes back Dr Alvan Dame has suggested Surgery which Patient has refused right now. He is able to walk with his walker.   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   Hypertension    Kidney stone    Presence of permanent cardiac pacemaker 06/03/2020   SCC (squamous cell carcinoma) 07/17/2008   SCC WELL DIFF (RIGHT HELICAL MARGIN)(MOHS)   SCC (squamous cell carcinoma) 03/24/2013   SCC IN SITU (BACK SCALP)(TX AFTER BX)   SCC (squamous cell carcinoma) 03/24/2013   SCC IN SITU (RIGHT SCALP)(TX AFTER BX)   SCC (squamous cell carcinoma) 03/24/2013   SCC IN SITU (MID SCALP)(TX AFTER BX)   SCC (squamous cell carcinoma) 03/24/2013   SCC IN SITU (MID NECK)(TX AFTER BX)   Spinal stenosis    Squamous cell carcinoma of skin 11/25/1997   ant right scalp    Tachy-brady syndrome ([facility]) 06/03/2020   Past Surgical History:  Procedure Laterality Date   addnoid     CYSTOSCOPY W/ URETEROSCOPY  1980   INSERT /  REPLACE / REMOVE PACEMAKER  06/03/2020   KNEE SURGERY     NAILBED REPAIR Left 06/27/2016   Procedure: NAILBED biopsy left thumb;  Surgeon: Daryll Brod, MD;  Location: [facility];  Service: Orthopedics;  Laterality: Left;  FAB   PACEMAKER IMPLANT N/A 06/03/2020   Procedure: PACEMAKER IMPLANT;  Surgeon: Constance Haw, MD;  Location: [facility];  Service: Cardiovascular;  Laterality: N/A;   TONSILLECTOMY      reports that he quit smoking about 65 years ago. His smoking use included cigarettes, pipe, and cigars. He has a 30.00 pack-year  smoking history. He has never used smokeless tobacco. He reports current alcohol use. He reports that he does not use drugs. Social History   Socioeconomic History   Marital status: Widowed    Spouse name: Not on file   Number of children: 4   Years of education: Not on file   Highest education level: Not on file  Occupational History   Occupation: retired  Tobacco Use   Smoking status: Former    Packs/day: 1.50    Years: 20.00    Pack years: 30.00    Types: Cigarettes, Pipe, Cigars    Quit date: 03/13/1956    Years since quitting: 65.4   Smokeless tobacco: Never  Vaping Use   Vaping Use: Never used  Substance and Sexual Activity   Alcohol use: Yes    Comment: daily   Drug use: No   Sexual activity: Not on file  Other Topics Concern   Not on file  Social History Narrative   Not on file   Social Determinants of Health   Financial Resource Strain: Not on file  Food Insecurity: Not on file  Transportation Needs: Not on file  Physical Activity: Not on file  Stress: Not on file  Social Connections: Not on file  Intimate Partner Violence: Not on file    Functional Status Survey:    Family History  Problem Relation Age of Onset   Heart failure Father    Heart disease Father     Health Maintenance  Topic Date Due   TETANUS/TDAP  Never done   Zoster Vaccines- Shingrix (1 of 2) Never done   COVID-19 Vaccine (4 - Booster for Moderna series) 12/02/2020   INFLUENZA VACCINE  06/13/2021   HPV VACCINES  Aged Out    No Known Allergies  Allergies as of 07/28/2021   No Known Allergies      Medication List        Accurate as of July 28, 2021 11:59 PM. If you have any questions, ask your nurse or doctor.          STOP taking these medications    lactose free nutrition Liqd Stopped by: Virgie Dad, MD       TAKE these medications    acetaminophen 325 MG tablet Commonly known as: TYLENOL Take 650 mg by mouth every 8 (eight) hours as needed.    amiodarone 200 MG tablet Commonly known as: PACERONE Take 200 mg by mouth daily. What changed: Another medication with the same name was removed. Continue taking this medication, and follow the directions you see here. Changed by: Virgie Dad, MD   apixaban 2.5 MG Tabs tablet Commonly known as: Eliquis Take 1 tablet (2.5 mg total) by mouth 2 (two) times daily.   atorvastatin 10 MG tablet Commonly known as: LIPITOR Take 10 mg by mouth every evening.   cyanocobalamin 1000 MCG tablet Take  1,000 mcg by mouth every morning.   diltiazem 120 MG tablet Commonly known as: CARDIZEM Take 1 tablet (120 mg total) by mouth daily.   furosemide 20 MG tablet Commonly known as: LASIX TAKE 2 TABLETS (40 MG TOTAL) BY MOUTH DAILY.   ICAPS AREDS 2 PO Take 2 tablets by mouth 2 (two) times daily.   Melatonin 10 MG Caps Take 5 mg by mouth at bedtime.   mirtazapine 7.5 MG tablet Commonly known as: REMERON Take 7.5 mg by mouth at bedtime.   omeprazole 20 MG capsule Commonly known as: PRILOSEC Take 20 mg by mouth daily with breakfast.   polyethylene glycol 17 g packet Commonly known as: MIRALAX / GLYCOLAX Take 17 g by mouth daily.   potassium chloride SA 20 MEQ tablet Commonly known as: KLOR-CON Take 40 mEq by mouth daily.   predniSONE 5 MG Tbec Take 5 mg by mouth daily.   senna-docusate 8.6-50 MG tablet Commonly known as: Senokot-S Take 1 tablet by mouth daily.   tamsulosin 0.4 MG Caps capsule Commonly known as: FLOMAX Take 0.4 mg by mouth every morning.   timolol 0.5 % ophthalmic solution Commonly known as: TIMOPTIC Place 1 drop into both eyes daily.   traZODone 150 MG tablet Commonly known as: DESYREL Take 75 mg by mouth at bedtime.   zinc oxide 20 % ointment Apply 1 application topically as needed for irritation.        Review of Systems  Constitutional:  Positive for activity change.  HENT: Negative.    Respiratory:  Negative for shortness of breath.    Cardiovascular: Negative.   Gastrointestinal: Negative.   Genitourinary: Negative.   Musculoskeletal:  Positive for arthralgias, back pain, gait problem and myalgias.  Skin: Negative.   Neurological:  Negative for dizziness.  Psychiatric/Behavioral:  Positive for sleep disturbance.    Vitals:   07/28/21 1238  BP: 124/71  Pulse: 87  Resp: (!) 24  Temp: 97.8 F (36.6 C)  SpO2: 97%  Weight: 154 lb 4.8 oz (70 kg)  Height: '5\' 6"'$  (1.676 m)   Body mass index is 24.9 kg/m. Physical Exam Constitutional: Oriented to person, place, and time. Well-developed and well-nourished.  HENT:  Head: Normocephalic.  Mouth/Throat: Oropharynx is clear and moist.  Eyes: Pupils are equal, round, and reactive to light.  Neck: Neck supple.  Cardiovascular: Normal rate and normal heart sounds.  No murmur heard. Pulmonary/Chest: Effort normal and breath sounds normal. No respiratory distress. No wheezes. has no rales.  Abdominal: Soft. Bowel sounds are normal. No distension. There is no tenderness. There is no rebound.  Musculoskeletal: No edema.  Lymphadenopathy: none Neurological: Alert and oriented to person, place, and time.  C/O Pain in Any movement in his Left Knee and Left Hip No Swelling Noticed Would not relax to do Further Exam  Right Knee and Hip exam was normal Skin: Skin is warm and dry.  Psychiatric: Normal mood and affect. Behavior is normal. Thought content normal.   Labs reviewed: Basic Metabolic Panel: Recent Labs    09/06/20 0145 09/08/20 0322 09/09/20 0351 09/13/20 0000 09/20/20 0000 10/28/20 0000  NA 137 133* 135 137 137 140  K 4.0 3.6 3.6 3.8 4.3 3.9  CL 104 101 101 101 102 108  CO2 '23 24 25 '$ 28* 30* 26*  GLUCOSE 95 108* 96  --   --   --   BUN 19 29* 23 26* 20 21  CREATININE 1.19 1.53* 1.25* 1.1 1.2 1.1  CALCIUM 8.5* 8.5* 8.7*  8.8 8.5* 8.2*   Liver Function Tests: Recent Labs    09/20/20 0000 10/28/20 0000  AST 19 15  ALT 29 13  ALKPHOS 183* 104  ALBUMIN  2.9* 3.1*   No results for input(s): LIPASE, AMYLASE in the last 8760 hours. No results for input(s): AMMONIA in the last 8760 hours. CBC: Recent Labs    09/05/20 0907 09/05/20 0913 09/06/20 0145 09/08/20 0322 09/09/20 0351 09/13/20 0000 09/20/20 0000 10/28/20 0000 11/11/20 0000  WBC 11.3*  --  9.1 10.9* 10.1   < > 6.9 7.1 7.4  NEUTROABS 8.2*  --   --   --   --   --  3,912.00 3,649.00  --   HGB 12.3*   < > 10.9* 9.5* 10.0*   < > 9.8* 9.9* 9.9*  HCT 38.2*   < > 33.3* 29.5* 30.3*   < > 30* 29* 30*  MCV 92.9  --  92.5 92.5 92.7  --   --   --   --   PLT 185  --  167 138* 180   < > 305 157 171   < > = values in this interval not displayed.   Cardiac Enzymes: No results for input(s): CKTOTAL, CKMB, CKMBINDEX, TROPONINI in the last 8760 hours. BNP: Invalid input(s): POCBNP No results found for: HGBA1C No results found for: TSH Lab Results  Component Value Date   VITAMINB12 1,382 (H) 08/16/2018   No results found for: FOLATE No results found for: IRON, TIBC, FERRITIN  Imaging and Procedures obtained prior to SNF admission: DG Chest 1 View  Result Date: 09/05/2020 CLINICAL DATA:  Fall with RIGHT chest pain. EXAM: CHEST  1 VIEW COMPARISON:  06/04/2020 prior studies FINDINGS: Cardiomegaly and LEFT-sided pacemaker again noted. There is no evidence of focal airspace disease, pulmonary edema, suspicious pulmonary nodule/mass, pleural effusion, or pneumothorax. No acute bony abnormalities are identified. IMPRESSION: Cardiomegaly without evidence of acute cardiopulmonary disease. Electronically Signed   By: Margarette Canada M.D.   On: 09/05/2020 10:16   DG Clavicle Right  Result Date: 09/05/2020 CLINICAL DATA:  Acute RIGHT clavicle pain following fall today. Initial encounter. EXAM: RIGHT CLAVICLE - 2+ VIEWS COMPARISON:  Prior chest and shoulder radiographs FINDINGS: A vertical fracture of the mid RIGHT clavicle is noted with 3 mm distraction. No other acute bony abnormalities are noted.  IMPRESSION: Minimally distracted mid RIGHT clavicle fracture. Electronically Signed   By: Margarette Canada M.D.   On: 09/05/2020 10:15   DG Shoulder Right  Result Date: 09/05/2020 CLINICAL DATA:  Fall with right shoulder pain EXAM: RIGHT SHOULDER - 2+ VIEW COMPARISON:  None. FINDINGS: Right mid clavicle fracture with branching component extending lateral to the level of the coracoclavicular interval. There is pending clavicle radiograph. No glenohumeral fracture or dislocation. Located acromioclavicular joint. Prominent osteopenia IMPRESSION: Mid right clavicle fracture extending to the level of the coracoclavicular interval. Electronically Signed   By: Monte Fantasia M.D.   On: 09/05/2020 10:20   CT Head Wo Contrast  Result Date: 09/05/2020 CLINICAL DATA:  Status post fall. EXAM: CT HEAD WITHOUT CONTRAST CT CERVICAL SPINE WITHOUT CONTRAST TECHNIQUE: Multidetector CT imaging of the head and cervical spine was performed following the standard protocol without intravenous contrast. Multiplanar CT image reconstructions of the cervical spine were also generated. COMPARISON:  None. FINDINGS: CT HEAD FINDINGS Brain: Mild diffuse cortical atrophy is noted. Mild chronic ischemic white matter disease is noted. No mass effect or midline shift is noted. Ventricular size is within normal  limits. There is no evidence of mass lesion, hemorrhage or acute infarction. Vascular: No hyperdense vessel or unexpected calcification. Skull: Normal. Negative for fracture or focal lesion. Sinuses/Orbits: No acute finding. Other: None. CT CERVICAL SPINE FINDINGS Alignment: Normal. Skull base and vertebrae: No acute fracture. No primary bone lesion or focal pathologic process. Soft tissues and spinal canal: No prevertebral fluid or swelling. No visible canal hematoma. Disc levels: Moderate degenerative disc disease is noted at C5-6 and C6-7. Upper chest: Negative. Other: Degenerative changes are seen involving posterior facet joints  bilaterally. IMPRESSION: 1. Mild diffuse cortical atrophy. Mild chronic ischemic white matter disease. No acute intracranial abnormality seen. 2. Multilevel degenerative disc disease. No acute abnormality seen in the cervical spine. Electronically Signed   By: Marijo Conception M.D.   On: 09/05/2020 10:50   CT Cervical Spine Wo Contrast  Result Date: 09/05/2020 CLINICAL DATA:  Status post fall. EXAM: CT HEAD WITHOUT CONTRAST CT CERVICAL SPINE WITHOUT CONTRAST TECHNIQUE: Multidetector CT imaging of the head and cervical spine was performed following the standard protocol without intravenous contrast. Multiplanar CT image reconstructions of the cervical spine were also generated. COMPARISON:  None. FINDINGS: CT HEAD FINDINGS Brain: Mild diffuse cortical atrophy is noted. Mild chronic ischemic white matter disease is noted. No mass effect or midline shift is noted. Ventricular size is within normal limits. There is no evidence of mass lesion, hemorrhage or acute infarction. Vascular: No hyperdense vessel or unexpected calcification. Skull: Normal. Negative for fracture or focal lesion. Sinuses/Orbits: No acute finding. Other: None. CT CERVICAL SPINE FINDINGS Alignment: Normal. Skull base and vertebrae: No acute fracture. No primary bone lesion or focal pathologic process. Soft tissues and spinal canal: No prevertebral fluid or swelling. No visible canal hematoma. Disc levels: Moderate degenerative disc disease is noted at C5-6 and C6-7. Upper chest: Negative. Other: Degenerative changes are seen involving posterior facet joints bilaterally. IMPRESSION: 1. Mild diffuse cortical atrophy. Mild chronic ischemic white matter disease. No acute intracranial abnormality seen. 2. Multilevel degenerative disc disease. No acute abnormality seen in the cervical spine. Electronically Signed   By: Marijo Conception M.D.   On: 09/05/2020 10:50   CT PELVIS WO CONTRAST  Result Date: 09/05/2020 CLINICAL DATA:  Right hip pain after  fall. EXAM: CT PELVIS WITHOUT CONTRAST TECHNIQUE: Multidetector CT imaging of the pelvis was performed following the standard protocol without intravenous contrast. COMPARISON:  September 16, 2012. FINDINGS: Urinary Tract:  No abnormality visualized. Bowel:  Unremarkable visualized pelvic bowel loops. Vascular/Lymphatic: No pathologically enlarged lymph nodes. No significant vascular abnormality seen. Reproductive:  No mass or other significant abnormality Other: Stable large left inguinal hernia is noted which contains a loop of colon, but does not result in obstruction. Stable large fat containing right inguinal hernia is noted as well. No ascites is noted. Musculoskeletal: Probable acute nondisplaced fracture is seen involving the right superior pubic ramus which extends posteriorly toward the medial acetabular area, although does not appear to involve the articular surface of the right hip. IMPRESSION: 1. Probable acute nondisplaced fracture is seen involving the right superior pubic ramus which extends posteriorly toward the medial acetabular area, although does not appear to involve the articular surface of the right hip. 2. Stable large left inguinal hernia is noted which contains a loop of colon, but does not result in obstruction. 3. Stable large fat containing right inguinal hernia is noted as well. Electronically Signed   By: Marijo Conception M.D.   On: 09/05/2020 11:57  DG Knee Complete 4 Views Right  Result Date: 09/05/2020 CLINICAL DATA:  Acute RIGHT knee pain following fall today. Initial encounter. EXAM: RIGHT KNEE - COMPLETE 4+ VIEW COMPARISON:  None. FINDINGS: No acute fracture, dislocation or joint effusion. Mild joint space narrowing osteophytosis in all 3 compartments noted. No focal bony lesions are present. Vascular calcifications are identified. IMPRESSION: 1. No acute abnormality. 2. Mild tricompartmental degenerative changes. Electronically Signed   By: Margarette Canada M.D.   On: 09/05/2020  10:24   DG Hip Unilat W or Wo Pelvis 2-3 Views Right  Result Date: 09/05/2020 CLINICAL DATA:  Acute RIGHT hip pain following fall today. Initial encounter. EXAM: DG HIP (WITH OR WITHOUT PELVIS) 2-3V RIGHT COMPARISON:  06/13/2014 FINDINGS: There is mild cortical irregularity along the RIGHT SUPERIOR and INFERIOR pubic rami which has a different appearance since 06/13/2014 and may represent nondisplaced fractures. No other fracture, subluxation or dislocation identified. Degenerative changes in both hips are present. No suspicious focal bony lesions are present. IMPRESSION: Mild cortical irregularity of the RIGHT SUPERIOR and INFERIOR pubic rami which may represent nondisplaced fractures. Consider CT for further evaluation as clinically indicated. Electronically Signed   By: Margarette Canada M.D.   On: 09/05/2020 10:35    Assessment/Plan Paroxysmal atrial fibrillation (HCC) On Amiodarone Check TSH Follows with Cardiology Also on Eliquis  Tachy-brady syndrome (HCC) S/p PPM Stage 3a chronic kidney disease (HCC) Creat stable Essential hypertension On Cardizem Chronic Back and Hip Pain He is on Prednisone ? Will try to taper it as it does not seems to help him Also Follows with Dr Alvan Dame  Tylenol PRN If no New recommendations by Ortho can consider ? Norco   Gastroesophageal reflux disease,  On Prilosec Hyperlipidemia,  Continue Lipitor Repeat Lipid panel Benign prostatic hyperplasia with urinary frequency On Flomax Insomnia secondary to depression with anxiety On Remeron And Trazodone Slow transit constipation Miralax PRN Anemia, unspecified type Repeat CBC   Family/ staff Communication:   Labs/tests ordered: TSH,CBC,CMP,Lipid Panel

## 2021-08-09 NOTE — Progress Notes (Signed)
Primary Physician/Referring:  Virgie Dad, MD  Patient ID: Stephen Cuevas, male    DOB: Feb 12, 1926, 87 y.o.   MRN: [id number]  Chief Complaint  Patient presents with   Atrial Fibrillation   sick sinus    6 months   HPI:    Jw Covin  is a 87 y.o. Caucasian male who resides at [facility] assisted living facility and has history of hyperlipidemia, hypertension, sick sinus syndrome with paroxysmal atrial fibrillation, complete heart block who underwent permanent pacemaker implantation on 06/04/2020 for marked dyspnea, fatigue and dizziness.   Patient presents for 18-monthfollow-up.  He is currently scheduled for total hip arthroplasty and November and presents for preoperative risk stratification. At last office visit patient was stable and no changes were made.  Patient now lives in an assisted living facility.  His mobility and quality of life has been limited due to left hip pain and therefore has been recommended left hip arthroplasty by Dr. OAlvan Dame Dr. OAlvan Dameis planning for spinal anesthesia.   Overall from a cardiovascular standpoint patient is doing relatively well.  Denies chest pain, palpitations, dizziness, syncope, near syncope.  Denies orthopnea, PND.  His activity is limited due to orthopedic pain.  Patient is accompanied by his son at today's office visit.  Past Medical History:  Diagnosis Date   Ankle edema    Anxiety    Basal cell carcinoma 11/21/2004   MID FOREHEAD   BCC (basal cell carcinoma of skin) 08/26/2007   (LEFT OUTER, ZYGOMATIC ARCH)(CURET3, EXC)   Dysrhythmia    a-fib/flutter- on eliquis   GERD (gastroesophageal reflux disease)    no treatment   Hard of hearing    Heart block AV complete ([place] 07/31/2020   Hypertension    Kidney stone    Presence of permanent cardiac pacemaker 06/03/2020   SCC (squamous cell carcinoma) 07/17/2008   SCC WELL DIFF (RIGHT HELICAL MARGIN)(MOHS)   SCC (squamous cell carcinoma) 03/24/2013   SCC IN  SITU (BACK SCALP)(TX AFTER BX)   SCC (squamous cell carcinoma) 03/24/2013   SCC IN SITU (RIGHT SCALP)(TX AFTER BX)   SCC (squamous cell carcinoma) 03/24/2013   SCC IN SITU (MID SCALP)(TX AFTER BX)   SCC (squamous cell carcinoma) 03/24/2013   SCC IN SITU (MID NECK)(TX AFTER BX)   Spinal stenosis    Squamous cell carcinoma of skin 11/25/1997   ant right scalp    Tachy-brady syndrome ([place] 06/03/2020   Past Surgical History:  Procedure Laterality Date   addnoid     CYSTOSCOPY W/ URETEROSCOPY  1980   INSERT / REPLACE / REMOVE PACEMAKER  06/03/2020   KNEE SURGERY     NAILBED REPAIR Left 06/27/2016   Procedure: NAILBED biopsy left thumb;  Surgeon: GDaryll Brod MD;  [facility]  Service: Orthopedics;  Laterality: Left;  FAB   PACEMAKER IMPLANT N/A 06/03/2020   Procedure: PACEMAKER IMPLANT;  Surgeon: CConstance Haw MD;  [facility];  Service: Cardiovascular;  Laterality: N/A;   TONSILLECTOMY     Family History  Problem Relation Age of Onset   Heart failure Father    Heart disease Father     Social History   Tobacco Use   Smoking status: Former    Packs/day: 1.50    Years: 20.00    Pack years: 30.00    Types: Cigarettes, Pipe, Cigars    Quit date: 03/13/1956    Years since quitting: 65.4   Smokeless tobacco:  Never  Substance Use Topics   Alcohol use: Yes    Comment: daily   Marital Status: Widowed ROS  Review of Systems  Cardiovascular:  Positive for leg swelling (mild, bilateral). Negative for chest pain, dyspnea on exertion, near-syncope, orthopnea, palpitations and syncope.  Musculoskeletal:  Positive for arthritis, back pain and joint pain (bilateral hip ).  Objective  Blood pressure (!) 93/54, pulse 72, resp. rate 17, height '5\' 6"'  (1.676 m), weight 155 lb (70.3 kg), SpO2 96 %.  Vitals with BMI 08/11/2021 08/11/2021 07/28/2021  Height - '5\' 6"'  '5\' 6"'   Weight - 155 lbs 154 lbs 5 oz  BMI - 09.81 19.14  Systolic 93 97 782   Diastolic 54 57 71  Pulse 72 75 87     Physical Exam Vitals reviewed.  Constitutional:      Comments: Wheelchair bound   Cardiovascular:     Rate and Rhythm: Normal rate and regular rhythm.     Pulses: Intact distal pulses.          Carotid pulses are 2+ on the right side and 2+ on the left side.      Popliteal pulses are 2+ on the right side and 2+ on the left side.       Dorsalis pedis pulses are 2+ on the right side and 2+ on the left side.       Posterior tibial pulses are 1+ on the right side and 1+ on the left side.     Heart sounds: S1 normal and S2 normal. Murmur heard.  Early systolic murmur is present with a grade of 2/6 at the upper right sternal border.    No gallop.     Comments: No JVD.   Pulmonary:     Effort: Pulmonary effort is normal.     Breath sounds: Normal breath sounds.  Musculoskeletal:     Right lower leg: Edema (minimal) present.     Left lower leg: Edema (minimal) present.  Skin:    General: Skin is warm and dry.     Capillary Refill: Capillary refill takes less than 2 seconds.  Neurological:     Mental Status: He is alert.   Laboratory examination:   CMP Latest Ref Rng & Units 10/28/2020 09/20/2020 09/13/2020  Glucose 70 - 99 mg/dL - - -  BUN 4 - '21 21 20 ' 26(A)  Creatinine 0.6 - 1.3 1.1 1.2 1.1  Sodium 137 - 147 140 137 137  Potassium 3.4 - 5.3 3.9 4.3 3.8  Chloride 99 - 108 108 102 101  CO2 13 - 22 26(A) 30(A) 28(A)  Calcium 8.7 - 10.7 8.2(A) 8.5(A) 8.8  Total Protein 6.0 - 8.3 g/dL - - -  Total Bilirubin 0.3 - 1.2 mg/dL - - -  Alkaline Phos 25 - 125 104 183(A) -  AST 14 - 40 15 19 -  ALT 10 - 40 13 29 -   CBC Latest Ref Rng & Units 11/11/2020 10/28/2020 09/20/2020  WBC - 7.4 7.1 6.9  Hemoglobin 13.5 - 17.5 9.9(A) 9.9(A) 9.8(A)  Hematocrit 41 - 53 30(A) 29(A) 30(A)  Platelets 150 - 399 171 157 305   Lipid Panel     Component Value Date/Time   CHOL 137 09/27/2020 0000   TRIG 92 09/27/2020 0000   HDL 44 09/27/2020 0000   LDLCALC 75  09/27/2020 0000   HEMOGLOBIN A1C No results found for: HGBA1C, MPG TSH No results for input(s): TSH in the last 8760 hours.  External labs:  02/16/2020: Serum glucose 105 mg, BUN 22, creatinine 1.7, EGFR 37.9 mL, potassium 4.7. Hb 12.3/HCT 40.9, platelets 190. Total cholesterol 162, triglycerides 70, HDL 63, LDL 85.  Non-HDL cholesterol 99. PSA normal   Allergies  No Known Allergies    Medications Prior to Visit:   Outpatient Medications Prior to Visit  Medication Sig Dispense Refill   acetaminophen (TYLENOL) 325 MG tablet Take 650 mg by mouth every 8 (eight) hours as needed.     amiodarone (PACERONE) 200 MG tablet Take 200 mg by mouth daily.     apixaban (ELIQUIS) 2.5 MG TABS tablet Take 1 tablet (2.5 mg total) by mouth 2 (two) times daily. 180 tablet 1   atorvastatin (LIPITOR) 10 MG tablet Take 10 mg by mouth every evening.   12   Cholecalciferol (D3-1000) 25 MCG (1000 UT) capsule Take 1,000 Units by mouth daily.     cyanocobalamin 1000 MCG tablet Take 1,000 mcg by mouth every morning.     diltiazem (CARDIZEM CD) 120 MG 24 hr capsule Take 120 mg by mouth daily.     furosemide (LASIX) 20 MG tablet TAKE 2 TABLETS (40 MG TOTAL) BY MOUTH DAILY. (Patient taking differently: Take 20 mg by mouth daily.) 60 tablet 0   Melatonin 10 MG CAPS Take 5 mg by mouth at bedtime.      mirtazapine (REMERON) 7.5 MG tablet Take 7.5 mg by mouth at bedtime.      Multiple Vitamins-Minerals (ICAPS AREDS 2 PO) Take 1 tablet by mouth 2 (two) times daily.     omeprazole (PRILOSEC) 20 MG capsule Take 20 mg by mouth daily with breakfast.      predniSONE 5 MG TBEC Take 5 mg by mouth daily.     senna-docusate (SENOKOT-S) 8.6-50 MG tablet Take 1 tablet by mouth daily.     tamsulosin (FLOMAX) 0.4 MG CAPS capsule Take 0.4 mg by mouth every morning.     timolol (TIMOPTIC) 0.5 % ophthalmic solution Place 1 drop into both eyes daily.     diltiazem (CARDIZEM) 120 MG tablet Take 1 tablet (120 mg total) by mouth  daily. 90 tablet 3   polyethylene glycol (MIRALAX / GLYCOLAX) 17 g packet Take 17 g by mouth daily.     potassium chloride SA (KLOR-CON) 20 MEQ tablet Take 40 mEq by mouth daily.     traZODone (DESYREL) 150 MG tablet Take 75 mg by mouth at bedtime.     zinc oxide 20 % ointment Apply 1 application topically as needed for irritation.     No facility-administered medications prior to visit.   Final Medications at End of Visit    Current Meds  Medication Sig   acetaminophen (TYLENOL) 325 MG tablet Take 650 mg by mouth every 8 (eight) hours as needed.   amiodarone (PACERONE) 200 MG tablet Take 200 mg by mouth daily.   apixaban (ELIQUIS) 2.5 MG TABS tablet Take 1 tablet (2.5 mg total) by mouth 2 (two) times daily.   atorvastatin (LIPITOR) 10 MG tablet Take 10 mg by mouth every evening.    Cholecalciferol (D3-1000) 25 MCG (1000 UT) capsule Take 1,000 Units by mouth daily.   cyanocobalamin 1000 MCG tablet Take 1,000 mcg by mouth every morning.   diltiazem (CARDIZEM CD) 120 MG 24 hr capsule Take 120 mg by mouth daily.   furosemide (LASIX) 20 MG tablet TAKE 2 TABLETS (40 MG TOTAL) BY MOUTH DAILY. (Patient taking differently: Take 20 mg by mouth daily.)   Melatonin 10 MG CAPS Take  5 mg by mouth at bedtime.    mirtazapine (REMERON) 7.5 MG tablet Take 7.5 mg by mouth at bedtime.    Multiple Vitamins-Minerals (ICAPS AREDS 2 PO) Take 1 tablet by mouth 2 (two) times daily.   omeprazole (PRILOSEC) 20 MG capsule Take 20 mg by mouth daily with breakfast.    predniSONE 5 MG TBEC Take 5 mg by mouth daily.   senna-docusate (SENOKOT-S) 8.6-50 MG tablet Take 1 tablet by mouth daily.   tamsulosin (FLOMAX) 0.4 MG CAPS capsule Take 0.4 mg by mouth every morning.   Radiology:   Chest x-ray PA and lateral view 02/16/2020: Mild hyperextension, patchy right basilar infiltrate, borderline cardiomegaly, atherosclerosis, thoracic levoscoliosis and spondylosis.  Cardiac Studies:   Echocardiogram 02/26/2020:  Normal  LV systolic function with visual EF 60-65%. Left ventricle cavity is normal in size. Mild left ventricular hypertrophy. Normal global wall  motion. Doppler evidence of grade II diastolic dysfunction. Calculated EF 66%.  Left atrial cavity is severely dilated, 77m/m2.  Right atrial cavity is dilated.  No evidence of aortic stenosis. Mild aortic regurgitation.  Mild to moderate mitral regurgitation.  Moderate tricuspid regurgitation. Moderate pulmonary hypertension. RVSP measures 51 mmHg.  IVC is dilated with a respiratory response of <50%.  No prior study for comparison.  Event monitor 04/27/2020 through 05/17/2020: Patient had paroxysmal episodes of atrial fibrillation with 4% atrial fibrillation burden. Predominant rhythm was sinus rhythm with nonconducted P waves and 1: 1 Mobitz 2 AV  block at 10:21 PM on 05/10/2020. Patient also had a 3.4-second ventricular standstill at 10:35 PM on 05/04/2020   [employer] Dual Chamber pacemaker 06/03/2018   Scheduled  In office pacemaker check 10/28/20  Single (S)/Dual (D)/BV: D. Presenting AFVS. Pacemaker dependant:  No. Underlying AF with VR 80-100, irregular. AP 16%, VP 12%.  AMS Episodes 4178.  AT/AF burden 21% . Longest 1D7H. Latest Ongoing since 8AM today. HVR 1. Longest 9Sec, 28 beat NSVT @ 192/min.   Longevity 9.6-11.1 Years. Magnet rate: >85%. Lead measurements: Stable..Marland KitchenHistogram: Low (L)/normal (N)/high (H)  Normal. Patient activity Decreased since Oct 2021.   Observations: Normal pacemaker function.  Frequent episodes of atrial fibrillation, since September 02, 2020, A. fib burden has been ranging from 17 up to 48%. Changes: None.   Scheduled remote transmission 06/02/2021:  Normal device function.    Known PAF, on OAC, AF burden is 6.7% of the time  Next remote 91 days.   EKG   02/09/2021: Sinus rhythm with first-degree AV block at a rate of 60 bpm.  Left atrial abnormality.  Left axis, left anterior  fascicular block.  Right bundle branch block.  Nonspecific T wave abnormality.  EKG 11/11/2020: Atrial paced rhythm with underlying sinus bradycardia with first-degree AV block at a rate of 62 bpm.  Left axis, left anterior fascicular block.  Right bundle branch block. Biventricular block  EKG 06/02/2020: Underlying sinus bradycardia at rate of 30 bpm with complete heart block and junctional escape, frequent PACs in bigeminal pattern that appeared to be conducted.  Left axis deviation, left anterior fascicular block.  Right bundle branch block.   EKG 03/29/2020: Sinus rhythm with first-degree block at rate of 83 bpm, leftward enlargement, left axis deviation, left intrafascicular block.  Right bundle branch block.  Bifascicular block.  Low-voltage complexes.  Nonspecific T abnormality.  EKG 03/22/2020, PCP EKG.  Sinus tachycardia at rate of 100 bpm with 2: 1 AV conduction, Mobitz 2 AV block 1: 1.  Left axis  deviation, left anterior fascicular block.  Right bundle branch block.  02/20/2020: Sinus bradycardia with first-degree AV block at the rate of 52 bpm, left axis deviation, left anterior fascicular block.  Right bundle branch block.  Trifascicular block.  Low-voltage complexes.  No evidence of ischemia.  02/20/2020: Atrial fibrillation with controlled ventricular response at rate of 49 bpm, left axis deviation, left anterior fascicular block.  Right bundle branch block.  Nonspecific T abnormality.    Assessment     ICD-10-CM   1. Tachycardia-bradycardia syndrome ([place])  I49.5     2. Essential hypertension  I10 EKG 12-Lead    3. Pacemaker [place] MRI Dual Chamber pacemaker 06/03/2018  Z95.0       CHA2DS2-VASc Score is 3.  Yearly risk of stroke: 3.2% (A, HTN).  No orders of the defined types were placed in this encounter.   Medications Discontinued During This Encounter  Medication Reason   diltiazem (CARDIZEM) 975 MG tablet Duplicate   traZODone (DESYREL) 150 MG tablet  Error   zinc oxide 20 % ointment Error   polyethylene glycol (MIRALAX / GLYCOLAX) 17 g packet Error   potassium chloride SA (KLOR-CON) 20 MEQ tablet Error    Recommendations:   E Yacoub Diltz  is a 87 y.o. Caucasian male who resides at [facility] assisted living facility and has history of hyperlipidemia, hypertension, sick sinus syndrome with paroxysmal atrial fibrillation, complete heart block who underwent permanent pacemaker implantation on 06/04/2020 for marked dyspnea, fatigue and dizziness.   Patient presents for 1-monthfollow-up.  He is currently scheduled for total hip arthroplasty and November and presents for preoperative risk stratification. At last office visit patient was stable and no changes were made.  I personally reviewed external labs.  Lipids are well controlled.  Advised patient that as he is on amiodarone recommend he follow-up with PCP for TSH and annual eye exam.  Patient's blood pressure is well controlled.  From a cardiovascular standpoint patient is relatively stable.  He is tolerating anticoagulation without bleeding diathesis, will continue Eliquis.  In regard to preoperative risk stratification, discussed at length with patient and his son regarding perioperative cardiovascular risk.  Patient has not had ischemic evaluation done, however echocardiogram revealed normal LVEF last year.  Patient has son are aware that patient is a moderate risk for cardiovascular complications perioperatively.  However given negative impact on quality of life shared decision was to proceed with left hip arthroplasty without further cardiovascular evaluation.  Follow-up in 3 months, sooner if needed, for hypertension, hyperlipidemia, sick sinus syndrome.  Patient was seen in collaboration with Dr. GEinar Gipand he is in agreement with the plan.   CAlethia Berthold PA-C 08/16/2021, 4:16 PM Office: [phone]

## 2021-08-11 ENCOUNTER — Ambulatory Visit: Payer: Medicare Other | Admitting: Student

## 2021-08-11 ENCOUNTER — Encounter: Payer: Self-pay | Admitting: Student

## 2021-08-11 ENCOUNTER — Other Ambulatory Visit: Payer: Self-pay

## 2021-08-11 VITALS — BP 93/54 | HR 72 | Resp 17 | Ht 66.0 in | Wt 155.0 lb

## 2021-08-11 DIAGNOSIS — I495 Sick sinus syndrome: Secondary | ICD-10-CM

## 2021-08-11 DIAGNOSIS — I10 Essential (primary) hypertension: Secondary | ICD-10-CM

## 2021-08-11 DIAGNOSIS — Z950 Presence of cardiac pacemaker: Secondary | ICD-10-CM

## 2021-08-12 ENCOUNTER — Ambulatory Visit: Payer: Medicare Other | Admitting: Student

## 2021-09-01 ENCOUNTER — Ambulatory Visit (INDEPENDENT_AMBULATORY_CARE_PROVIDER_SITE_OTHER): Payer: Medicare Other

## 2021-09-01 DIAGNOSIS — I495 Sick sinus syndrome: Secondary | ICD-10-CM | POA: Diagnosis not present

## 2021-09-01 LAB — CUP PACEART REMOTE DEVICE CHECK
Battery Remaining Longevity: 109 mo
Battery Remaining Percentage: 92 %
Battery Voltage: 3.02 V
Brady Statistic AP VP Percent: 10 %
Brady Statistic AP VS Percent: 30 %
Brady Statistic AS VP Percent: 2.1 %
Brady Statistic AS VS Percent: 57 %
Brady Statistic RA Percent Paced: 38 %
Brady Statistic RV Percent Paced: 14 %
Date Time Interrogation Session: 20221020021643
Implantable Lead Implant Date: 20210722
Implantable Lead Implant Date: 20210722
Implantable Lead Location: 753859
Implantable Lead Location: 753860
Implantable Pulse Generator Implant Date: 20210722
Lead Channel Impedance Value: 410 Ohm
Lead Channel Impedance Value: 490 Ohm
Lead Channel Pacing Threshold Amplitude: 0.875 V
Lead Channel Pacing Threshold Amplitude: 1 V
Lead Channel Pacing Threshold Pulse Width: 0.4 ms
Lead Channel Pacing Threshold Pulse Width: 0.4 ms
Lead Channel Sensing Intrinsic Amplitude: 1.4 mV
Lead Channel Sensing Intrinsic Amplitude: 6.4 mV
Lead Channel Setting Pacing Amplitude: 2 V
Lead Channel Setting Pacing Amplitude: 2.5 V
Lead Channel Setting Pacing Pulse Width: 0.4 ms
Lead Channel Setting Sensing Sensitivity: 2 mV
Pulse Gen Model: 2272
Pulse Gen Serial Number: 3851047

## 2021-09-09 NOTE — Progress Notes (Signed)
Remote pacemaker transmission.   

## 2021-09-26 NOTE — Progress Notes (Signed)
DUE TO COVID-19 ONLY ONE VISITOR IS ALLOWED TO COME WITH YOU AND STAY IN THE WAITING ROOM ONLY DURING PRE OP AND PROCEDURE DAY OF SURGERY.  2 VISITOR  MAY VISIT WITH YOU AFTER SURGERY IN YOUR PRIVATE ROOM DURING VISITING HOURS ONLY!  YOU NEED TO HAVE A COVID 19 TEST ON___  09/30/2021 @_  @_from  8am-3pm _____, THIS TEST MUST BE DONE BEFORE SURGERY,  Covid test is done at [place], [place] [address].  This is a drive thru.  No appt required. Please see map.                 Your procedure is scheduled on:  10/04/2021   Report to [facility] Main  Entrance   Report to admitting at    0600AM     Call this number if you have problems the morning of surgery [phone]    REMEMBER: NO  SOLID FOOD CANDY OR GUM AFTER MIDNIGHT. CLEAR LIQUIDS UNTIL     0540am       . NOTHING BY MOUTH EXCEPT CLEAR LIQUIDS UNTIL  0540am   . PLEASE FINISH ENSURE DRINK PER SURGEON ORDER  WHICH NEEDS TO BE COMPLETED AT      .  0540am     CLEAR LIQUID DIET   Foods Allowed                                                                    Coffee and tea, regular and decaf                            Fruit ices (not with fruit pulp)                                      Iced Popsicles                                    Carbonated beverages, regular and diet                                    Cranberry, grape and apple juices Sports drinks like Gatorade Lightly seasoned clear broth or consume(fat free) Sugar, honey syrup ___________________________________________________________________      BRUSH YOUR TEETH MORNING OF SURGERY AND RINSE YOUR MOUTH OUT, NO CHEWING GUM CANDY OR MINTS.     Take these medicines the morning of surgery with A SIP OF WATER:   cardizem, omeprazole, flomax, eye drops as usual   DO NOT TAKE ANY DIABETIC MEDICATIONS DAY OF YOUR SURGERY                               You may not have any metal on your body including hair pins and              piercings  Do not  wear jewelry, make-up, lotions, powders or perfumes, deodorant  Do not wear nail polish on your fingernails.  Do not shave  48 hours prior to surgery.              Men may shave face and neck.   Do not bring valuables to the hospital. [place].  Contacts, dentures or bridgework may not be worn into surgery.  Leave suitcase in the car. After surgery it may be brought to your room.     Patients discharged the day of surgery will not be allowed to drive home. IF YOU ARE HAVING SURGERY AND GOING HOME THE SAME DAY, YOU MUST HAVE AN ADULT TO DRIVE YOU HOME AND BE WITH YOU FOR 24 HOURS. YOU MAY GO HOME BY TAXI OR UBER OR ORTHERWISE, BUT AN ADULT MUST ACCOMPANY YOU HOME AND STAY WITH YOU FOR 24 HOURS.  Name and phone number of your driver:  Special Instructions: N/A              Please read over the following fact sheets you were given: _____________________________________________________________________  [facility] - Preparing for Surgery Before surgery, you can play an important role.  Because skin is not sterile, your skin needs to be as free of germs as possible.  You can reduce the number of germs on your skin by washing with CHG (chlorahexidine gluconate) soap before surgery.  CHG is an antiseptic cleaner which kills germs and bonds with the skin to continue killing germs even after washing. Please DO NOT use if you have an allergy to CHG or antibacterial soaps.  If your skin becomes reddened/irritated stop using the CHG and inform your nurse when you arrive at Short Stay. Do not shave (including legs and underarms) for at least 48 hours prior to the first CHG shower.  You may shave your face/neck. Please follow these instructions carefully:  1.  Shower with CHG Soap the night before surgery and the  morning of Surgery.  2.  If you choose to wash your hair, wash your hair first as usual with your  normal  shampoo.  3.  After you  shampoo, rinse your hair and body thoroughly to remove the  shampoo.                           4.  Use CHG as you would any other liquid soap.  You can apply chg directly  to the skin and wash                       Gently with a scrungie or clean washcloth.  5.  Apply the CHG Soap to your body ONLY FROM THE NECK DOWN.   Do not use on face/ open                           Wound or open sores. Avoid contact with eyes, ears mouth and genitals (private parts).                       Wash face,  Genitals (private parts) with your normal soap.             6.  Wash thoroughly, paying special attention to the area where your surgery  will be performed.  7.  Thoroughly rinse your body with  warm water from the neck down.  8.  DO NOT shower/wash with your normal soap after using and rinsing off  the CHG Soap.                9.  Pat yourself dry with a clean towel.            10.  Wear clean pajamas.            11.  Place clean sheets on your bed the night of your first shower and do not  sleep with pets. Day of Surgery : Do not apply any lotions/deodorants the morning of surgery.  Please wear clean clothes to the hospital/surgery center.  FAILURE TO FOLLOW THESE INSTRUCTIONS MAY RESULT IN THE CANCELLATION OF YOUR SURGERY PATIENT SIGNATURE_________________________________  NURSE SIGNATURE__________________________________  ________________________________________________________________________

## 2021-09-26 NOTE — Progress Notes (Addendum)
Anesthesia Review:  PCP: DR Veleta Miners  Last visit- 07/28/21 at Facility  Cardiologist : DR [name]  08/11/21- LOV Celeste Cardwell,PA  Ker[place] with DR [name]- 10/28/2020. Clearance in 08/11/21- encounter.  09/01/21- Device Check  Orders requested on 09/28/21.   Chest x-ray : EKG : 08/11/21  Echo : 02/26/2020  Stress test: Cardiac Cath :  Activity level: cannot do a flight of stairs without difficulty  Sleep Study/ CPAP : none  Fasting Blood Sugar :      / Checks Blood Sugar -- times a day:   Blood Thinner/ Instructions /Last Dose: ASA / Instructions/ Last Dose :   Eliquis- Last Dose on 09/30/21 per son  Stephen Cuevas,  Stephen Cuevas accompanied pt to preop.  Pt in a wheelchair.  HOH.  Able to answer most questions.  Is a resident at [facility] in [place].  Nurse administers medications and he has help with ADLS.   AT preop pt has copy of preop instructions along with son.  Also sent 3 copies of preop instructions with son to take back to [facility].  Informed son that nurse would call and speak with Eber Jones at [facility] who is the day nurse and review all preop instructions with her.  Called and LVMM for Eber Jones to call [phone].   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   Covid test on 09/30/21.   Son to brin in [place] and DNR paperwork day of surgery.   CBC done 09/28/2021 routed to DR [facility]. +

## 2021-09-28 ENCOUNTER — Other Ambulatory Visit: Payer: Self-pay

## 2021-09-28 ENCOUNTER — Encounter (HOSPITAL_COMMUNITY): Payer: Self-pay

## 2021-09-28 ENCOUNTER — Encounter (HOSPITAL_COMMUNITY)
Admission: RE | Admit: 2021-09-28 | Discharge: 2021-09-28 | Disposition: A | Discharge status: Home / Self Care | Payer: Medicare Other | Source: Ambulatory Visit | Attending: Orthopedic Surgery | Admitting: Orthopedic Surgery

## 2021-09-28 VITALS — BP 108/69 | HR 84 | Temp 98.3°F | Resp 16 | Ht 66.0 in

## 2021-09-28 DIAGNOSIS — Z87891 Personal history of nicotine dependence: Secondary | ICD-10-CM | POA: Diagnosis not present

## 2021-09-28 DIAGNOSIS — Z950 Presence of cardiac pacemaker: Secondary | ICD-10-CM | POA: Diagnosis not present

## 2021-09-28 DIAGNOSIS — Z7901 Long term (current) use of anticoagulants: Secondary | ICD-10-CM | POA: Diagnosis not present

## 2021-09-28 DIAGNOSIS — K219 Gastro-esophageal reflux disease without esophagitis: Secondary | ICD-10-CM | POA: Diagnosis not present

## 2021-09-28 DIAGNOSIS — M1612 Unilateral primary osteoarthritis, left hip: Secondary | ICD-10-CM | POA: Diagnosis not present

## 2021-09-28 DIAGNOSIS — Z01818 Encounter for other preprocedural examination: Secondary | ICD-10-CM

## 2021-09-28 DIAGNOSIS — I10 Essential (primary) hypertension: Secondary | ICD-10-CM | POA: Insufficient documentation

## 2021-09-28 DIAGNOSIS — Z01812 Encounter for preprocedural laboratory examination: Secondary | ICD-10-CM | POA: Diagnosis not present

## 2021-09-28 DIAGNOSIS — I4891 Unspecified atrial fibrillation: Secondary | ICD-10-CM | POA: Diagnosis not present

## 2021-09-28 HISTORY — DX: Unspecified osteoarthritis, unspecified site: M19.90

## 2021-09-28 HISTORY — DX: Personal history of urinary calculi: Z87.442

## 2021-09-28 HISTORY — DX: Dyspnea, unspecified: R06.00

## 2021-09-28 LAB — CBC
HCT: 37.2 % — ABNORMAL LOW (ref 39.0–52.0)
Hemoglobin: 11.7 g/dL — ABNORMAL LOW (ref 13.0–17.0)
MCH: 32.1 pg (ref 26.0–34.0)
MCHC: 31.5 g/dL (ref 30.0–36.0)
MCV: 101.9 fL — ABNORMAL HIGH (ref 80.0–100.0)
Platelets: 257 10*3/uL (ref 150–400)
RBC: 3.65 MIL/uL — ABNORMAL LOW (ref 4.22–5.81)
RDW: 13.5 % (ref 11.5–15.5)
WBC: 12.6 10*3/uL — ABNORMAL HIGH (ref 4.0–10.5)
nRBC: 0 % (ref 0.0–0.2)

## 2021-09-28 LAB — COMPREHENSIVE METABOLIC PANEL
ALT: 25 U/L (ref 0–44)
AST: 26 U/L (ref 15–41)
Albumin: 3.4 g/dL — ABNORMAL LOW (ref 3.5–5.0)
Alkaline Phosphatase: 74 U/L (ref 38–126)
Anion gap: 8 (ref 5–15)
BUN: 21 mg/dL (ref 8–23)
CO2: 25 mmol/L (ref 22–32)
Calcium: 8.9 mg/dL (ref 8.9–10.3)
Chloride: 104 mmol/L (ref 98–111)
Creatinine, Ser: 1.27 mg/dL — ABNORMAL HIGH (ref 0.61–1.24)
GFR, Estimated: 53 mL/min — ABNORMAL LOW (ref >60)
Glucose, Bld: 171 mg/dL — ABNORMAL HIGH (ref 70–99)
Potassium: 4.3 mmol/L (ref 3.5–5.1)
Sodium: 137 mmol/L (ref 135–145)
Total Bilirubin: 0.7 mg/dL (ref 0.3–1.2)
Total Protein: 6.2 g/dL — ABNORMAL LOW (ref 6.5–8.1)

## 2021-09-28 LAB — SURGICAL PCR SCREEN
MRSA, PCR: NEGATIVE
Staphylococcus aureus: NEGATIVE

## 2021-09-28 NOTE — Progress Notes (Signed)
Anesthesia Chart Review   Case: [id number] Date/Time: 10/04/21 0825   Procedure: TOTAL HIP ARTHROPLASTY ANTERIOR APPROACH (Left: Hip)   Anesthesia type: Spinal   Pre-op diagnosis: Left hip osteoarthritis   Location: [facility] ROOM 10 / [facility]   Surgeons: Paralee Cancel, MD       DISCUSSION:87 y.o. former smoker with h/o HTN, GERD, a-fib (on Eliquis), pacemaker in place, left hip OA scheduled for above procedure 10/04/21 with Dr. Paralee Cancel.   Pt last seen by cardiology 08/11/2021. Per OV note, "In regard to preoperative risk stratification, discussed at length with patient and his son regarding perioperative cardiovascular risk.  Patient has not had ischemic evaluation done, however echocardiogram revealed normal LVEF last year.  Patient has son are aware that patient is a moderate risk for cardiovascular complications perioperatively.  However given negative impact on quality of life shared decision was to proceed with left hip arthroplasty without further cardiovascular evaluation."  Last dose of Eliquis 09/30/2021.   Anticipate pt can proceed with planned procedure barring acute status change.   VS: BP 108/69   Pulse 84   Temp 36.8 C (Oral)   Resp 16   Ht 5\' 6"  (1.676 m)   SpO2 97%   BMI 25.02 kg/m   PROVIDERS: Virgie Dad, MD is PCP    LABS: Labs reviewed: Acceptable for surgery. (all labs ordered are listed, but only abnormal results are displayed)  Labs Reviewed  CBC - Abnormal; Notable for the following components:      Result Value   WBC 12.6 (*)    RBC 3.65 (*)    Hemoglobin 11.7 (*)    HCT 37.2 (*)    MCV 101.9 (*)    All other components within normal limits  COMPREHENSIVE METABOLIC PANEL - Abnormal; Notable for the following components:   Glucose, Bld 171 (*)    Creatinine, Ser 1.27 (*)    Total Protein 6.2 (*)    Albumin 3.4 (*)    GFR, Estimated 53 (*)    All other components within normal limits  SURGICAL PCR SCREEN  TYPE AND SCREEN      IMAGES:   EKG: 08/11/2021 Rate 74 bpm  Sinus rhythm  RBBB Nonspecific T abnormality  CV: Echocardiogram 02/26/2020:  Normal LV systolic function with visual EF 60-65%. Left ventricle cavity  is normal in size. Mild left ventricular hypertrophy. Normal global wall  motion. Doppler evidence of grade II diastolic dysfunction. Calculated EF  66%.  Left atrial cavity is severely dilated, 63ml/m2.  Right atrial cavity is dilated.  No evidence of aortic stenosis. Mild aortic regurgitation.  Mild to moderate mitral regurgitation.  Moderate tricuspid regurgitation. Moderate pulmonary hypertension. RVSP  measures 51 mmHg.  IVC is dilated with a respiratory response of <50%.  No prior study for comparison.  Echo 01/01/2015 - Left ventricle: The cavity size was normal. Systolic function was    normal. The estimated ejection fraction was in the range of 55%    to 60%. Wall motion was normal; there were no regional wall    motion abnormalities. Left ventricular diastolic function    parameters were normal.  - Mitral valve: Calcified annulus.  - Atrial septum: No defect or patent foramen ovale was identified.  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   History of kidney stones    Hypertension    Kidney stone    Presence of permanent cardiac pacemaker 06/03/2020   SCC (squamous cell carcinoma) 07/17/2008   SCC WELL DIFF (RIGHT HELICAL MARGIN)(MOHS)   SCC (squamous cell carcinoma) 03/24/2013   SCC IN SITU (BACK SCALP)(TX AFTER BX)   SCC (squamous cell carcinoma) 03/24/2013   SCC IN SITU (RIGHT SCALP)(TX AFTER BX)   SCC  (squamous cell carcinoma) 03/24/2013   SCC IN SITU (MID SCALP)(TX AFTER BX)   SCC (squamous cell carcinoma) 03/24/2013   SCC IN SITU (MID NECK)(TX AFTER BX)   Spinal stenosis    Squamous cell carcinoma of skin 11/25/1997   ant right scalp    Tachy-brady syndrome ([place]) 06/03/2020    Past Surgical History:  Procedure Laterality Date   addnoid     CYSTOSCOPY W/ URETEROSCOPY  1980   INSERT / REPLACE / REMOVE PACEMAKER  06/03/2020   KNEE SURGERY     NAILBED REPAIR Left 06/27/2016   Procedure: NAILBED biopsy left thumb;  Surgeon: Daryll Brod, MD;  Location: [place];  Service: Orthopedics;  Laterality: Left;  FAB   PACEMAKER IMPLANT N/A 06/03/2020   Procedure: PACEMAKER IMPLANT;  Surgeon: Constance Haw, MD;  Location: [facility];  Service: Cardiovascular;  Laterality: N/A;   TONSILLECTOMY      MEDICATIONS:  acetaminophen (TYLENOL) 650 MG CR tablet   amiodarone (PACERONE) 200 MG tablet   apixaban (ELIQUIS) 2.5 MG TABS tablet   atorvastatin (LIPITOR) 10 MG tablet   cyanocobalamin 1000 MCG tablet   diltiazem (CARDIZEM CD) 120 MG 24 hr capsule   furosemide (LASIX) 40 MG tablet   magnesium hydroxide (MILK OF MAGNESIA) 400 MG/5ML suspension   melatonin 5 MG TABS   mirtazapine (REMERON) 15 MG tablet   Multiple Vitamins-Minerals (PRESERVISION AREDS 2+MULTI VIT PO)   omeprazole (PRILOSEC) 20 MG capsule   polyethylene glycol (MIRALAX / GLYCOLAX) 17 g packet   predniSONE 5 MG TBEC   senna-docusate (SENOKOT-S) 8.6-50 MG tablet   tamsulosin (FLOMAX) 0.4 MG CAPS capsule   timolol (TIMOPTIC) 0.5 % ophthalmic solution   zinc oxide 20 % ointment   No current facility-administered medications for this encounter.     Konrad Felix Ward, PA-C [facility] [phone]

## 2021-09-28 NOTE — Anesthesia Preprocedure Evaluation (Addendum)
Anesthesia Evaluation  Patient identified by MRN, date of birth, ID band Patient awake    Reviewed: Allergy & Precautions, NPO status , Patient's Chart, lab work & pertinent test results  Airway Mallampati: II       Dental no notable dental hx.    Pulmonary former smoker,    Pulmonary exam normal        Cardiovascular hypertension, Pt. on medications Normal cardiovascular exam+ dysrhythmias Atrial Fibrillation + pacemaker      Neuro/Psych    GI/Hepatic GERD  Medicated,  Endo/Other    Renal/GU Renal InsufficiencyRenal diseaseCKD III  negative genitourinary   Musculoskeletal   Abdominal Normal abdominal exam  (+)   Peds  Hematology  (+) anemia ,   Anesthesia Other Findings       Anesthesia Chart Review    Case: [id number] Date/Time: 10/04/21 0825  Procedure: TOTAL HIP ARTHROPLASTY ANTERIOR APPROACH (Left: Hip)  Anesthesia type: Spinal  Pre-op diagnosis: Left hip osteoarthritis  Location: [place] [facility] / [facility]  Surgeons: Paralee Cancel, MD      DISCUSSION:87 y.o. former smoker with h/o HTN, GERD, a-fib (on Eliquis), pacemaker in place, left hip OA scheduled for above procedure 10/04/21 with Dr. Paralee Cancel.   Pt last seen by cardiology 08/11/2021. Per OV note, "In regard to preoperative risk stratification, discussed at length with patient and his son regarding perioperative cardiovascular risk. Patient has not had ischemic evaluation done, however echocardiogram revealed normal LVEF last year. Patient has son are aware that patient is a moderate risk for cardiovascular complications perioperatively. However given negative impact on quality of life shared decision was to proceed with left hip arthroplasty without further cardiovascular evaluation."  Last dose of Eliquis 09/30/2021.   Anticipate pt can proceed with planned procedure barring acute status change.   VS: BP 108/69   Pulse 84    Temp 36.8 C (Oral)   Resp 16   Ht 5\' 6"  (1.676 m)   SpO2 97%   BMI 25.02 kg/m   PROVIDERS: Virgie Dad, MD is PCP    LABS: Labs reviewed: Acceptable for surgery. (all labs ordered are listed, but only abnormal results are displayed)   Labs Reviewed CBC - Abnormal; Notable for the following components:     Result Value   WBC 12.6 (*)   RBC 3.65 (*)   Hemoglobin 11.7 (*)   HCT 37.2 (*)   MCV 101.9 (*)   All other components within normal limits COMPREHENSIVE METABOLIC PANEL - Abnormal; Notable for the following components:  Glucose, Bld 171 (*)   Creatinine, Ser 1.27 (*)   Total Protein 6.2 (*)   Albumin 3.4 (*)   GFR, Estimated 53 (*)   All other components within normal limits SURGICAL PCR SCREEN TYPE AND SCREEN    IMAGES:   EKG: 08/11/2021 Rate 74 bpm  Sinus rhythm  RBBB Nonspecific T abnormality  CV: Echocardiogram 02/26/2020:  Normal LV systolic function with visual EF 60-65%. Left ventricle cavity  is normal in size. Mild left ventricular hypertrophy. Normal global wall  motion. Doppler evidence of grade II diastolic dysfunction. Calculated EF  66%.  Left atrial cavity is severely dilated, 7ml/m2.  Right atrial cavity is dilated.  No evidence of aortic stenosis. Mild aortic regurgitation.  Mild to moderate mitral regurgitation.  Moderate tricuspid regurgitation. Moderate pulmonary hypertension. RVSP  measures 51 mmHg.  IVC is dilated with a respiratory response of <50%.  No prior study for comparison.  Echo 01/01/2015  Reproductive/Obstetrics                            Anesthesia Physical Anesthesia Plan  ASA: 3  Anesthesia Plan: Spinal   Post-op Pain Management:    Induction:   PONV Risk Score and Plan: 1  Airway Management Planned:   Additional Equipment: None  Intra-op Plan:   Post-operative Plan:   Informed Consent: I have reviewed the patients History and  Physical, chart, labs and discussed the procedure including the risks, benefits and alternatives for the proposed anesthesia with the patient or authorized representative who has indicated his/her understanding and acceptance.       Plan Discussed with: CRNA  Anesthesia Plan Comments: (See PAT note 09/28/2021, Konrad Felix Ward, PA-C)       Anesthesia Quick Evaluation

## 2021-09-30 ENCOUNTER — Other Ambulatory Visit: Payer: Self-pay | Admitting: Orthopedic Surgery

## 2021-09-30 LAB — SARS CORONAVIRUS 2 (TAT 6-24 HRS): SARS Coronavirus 2: NEGATIVE

## 2021-10-03 NOTE — Progress Notes (Signed)
Called and spoke with Stephen Cuevas at [facility] and reviewed preop instructions with her.  Stephen Cuevas asked if pt could take a Tylenol am of surgery on 10/04/21 and nurse stated he could take a Tylenol am of surgery. Stephen Cuevas D voiced understanding.  Stephen Cuevas D also asked if pt had to take 2 sponge baths with hibiclens ( one night before and one am of surgery. Nurse instructed Stephen Cuevas at [facility] that pt to do sponge bath nite before with hibiclens and am of surgery with hibiclens.  Stephen Cuevas D voiced understanding.  Stephen Cuevas D stated she had no other questions regarding preop instructions.

## 2021-10-03 NOTE — H&P (Signed)
TOTAL HIP ADMISSION H&P  Patient is admitted for left total hip arthroplasty.  Subjective:  Chief Complaint: left hip pain  HPI: Stephen Cuevas, 87 y.o. male, has a history of pain and functional disability in the left hip(s) due to arthritis and patient has failed non-surgical conservative treatments for greater than 12 weeks to include NSAID's and/or analgesics and activity modification.  Onset of symptoms was gradual starting 2 years ago with gradually worsening course since that time.The patient noted no past surgery on the left hip(s).  Patient currently rates pain in the left hip at 8 out of 10 with activity. Patient has worsening of pain with activity and weight bearing, pain that interfers with activities of daily living, and pain with passive range of motion. Patient has evidence of joint space narrowing by imaging studies. This condition presents safety issues increasing the risk of falls. There is no current active infection.  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/10/2020   Slow transit constipation 09/10/2020   Sciatica, left side 09/10/2020   Fall at home, initial encounter 09/05/2020   Essential hypertension 09/05/2020   Atrial fibrillation, chronic ([place]) 09/05/2020   Closed traumatic minimally displaced fracture of shaft of right clavicle 09/05/2020   Pubic ramus fracture, right, closed, initial encounter ([place]) 09/05/2020   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/31/2020   Tachy-brady syndrome ([place]) 06/03/2020   Pacemaker [place] MRI Dual Chamber pacemaker 06/03/2018 06/03/2020   Bilateral inguinal hernia 04/23/2013   Past Medical  History:  Diagnosis Date   Ankle edema    Anxiety    Arthritis    Basal cell carcinoma 11/21/2004   MID FOREHEAD   BCC (basal cell carcinoma of skin) 08/26/2007   (LEFT OUTER, ZYGOMATIC ARCH)(CURET3, EXC)   Dyspnea    Dysrhythmia    a-fib/flutter- on eliquis   GERD (gastroesophageal reflux disease)    no treatment   Hard of hearing    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/31/2020   History of kidney stones    Hypertension    Kidney stone    Presence of permanent cardiac pacemaker 06/03/2020   SCC (squamous cell carcinoma) 07/17/2008   SCC WELL DIFF (RIGHT HELICAL MARGIN)(MOHS)   SCC (squamous cell carcinoma) 03/24/2013   SCC IN SITU (BACK SCALP)(TX AFTER BX)   SCC (squamous cell carcinoma) 03/24/2013   SCC IN SITU (RIGHT SCALP)(TX AFTER BX)   SCC (squamous cell carcinoma) 03/24/2013   SCC IN SITU (MID SCALP)(TX AFTER BX)   SCC (squamous cell carcinoma) 03/24/2013   SCC IN SITU (MID NECK)(TX AFTER BX)   Spinal stenosis    Squamous cell carcinoma of skin 11/25/1997   ant right scalp    Tachy-brady syndrome ([place]) 06/03/2020    Past Surgical History:  Procedure Laterality Date   addnoid     CYSTOSCOPY W/ URETEROSCOPY  1980   INSERT / REPLACE / REMOVE PACEMAKER  06/03/2020   KNEE SURGERY     NAILBED REPAIR Left 06/27/2016   Procedure: NAILBED biopsy left thumb;  Surgeon: Daryll Brod, MD;  Location: [place];  Service: Orthopedics;  Laterality: Left;  FAB   PACEMAKER IMPLANT N/A 06/03/2020   Procedure:  PACEMAKER IMPLANT;  Surgeon: Constance Haw, MD;  Location: [place] [facility];  Service: Cardiovascular;  Laterality: N/A;   TONSILLECTOMY      No current facility-administered medications for this encounter.   Current Outpatient Medications  Medication Sig Dispense Refill Last Dose   acetaminophen (TYLENOL) 650 MG CR tablet Take 650 mg by mouth in the morning, at noon, and at bedtime.      amiodarone (PACERONE) 200 MG tablet Take 200 mg by mouth daily.       apixaban (ELIQUIS) 2.5 MG TABS tablet Take 1 tablet (2.5 mg total) by mouth 2 (two) times daily. 180 tablet 1    atorvastatin (LIPITOR) 10 MG tablet Take 10 mg by mouth every evening.   12    cyanocobalamin 1000 MCG tablet Take 1,000 mcg by mouth every morning.      diltiazem (CARDIZEM CD) 120 MG 24 hr capsule Take 120 mg by mouth daily.      furosemide (LASIX) 40 MG tablet Take 20 mg by mouth daily.      magnesium hydroxide (MILK OF MAGNESIA) 400 MG/5ML suspension Take 30 mLs by mouth daily as needed for mild constipation.      melatonin 5 MG TABS Take 5 mg by mouth at bedtime.       mirtazapine (REMERON) 15 MG tablet Take 7.5 mg by mouth at bedtime.       Multiple Vitamins-Minerals (PRESERVISION AREDS 2+MULTI VIT PO) Take 1 capsule by mouth in the morning and at bedtime.      omeprazole (PRILOSEC) 20 MG capsule Take 20 mg by mouth daily with breakfast.       polyethylene glycol (MIRALAX / GLYCOLAX) 17 g packet Take 17 g by mouth daily as needed.      predniSONE 5 MG TBEC Take 5 mg by mouth daily.      senna-docusate (SENOKOT-S) 8.6-50 MG tablet Take 1 tablet by mouth daily.      tamsulosin (FLOMAX) 0.4 MG CAPS capsule Take 0.4 mg by mouth every morning.      timolol (TIMOPTIC) 0.5 % ophthalmic solution Place 1 drop into both eyes daily.      zinc oxide 20 % ointment Apply 1 application topically as needed for irritation.      No Known Allergies  Social History   Tobacco Use   Smoking status: Former    Packs/day: 1.50    Years: 20.00    Pack years: 30.00    Types: Cigarettes, Pipe, Cigars    Quit date: 03/13/1956    Years since quitting: 65.6   Smokeless tobacco: Never  Substance Use Topics   Alcohol use: Yes    Comment: daily- 2 manhattans    Family History  Problem Relation Age of Onset   Heart failure Father    Heart disease Father      Review of Systems  Constitutional:  Negative for chills and fever.  Respiratory:  Negative for cough and shortness of breath.    Cardiovascular:  Negative for chest pain.  Gastrointestinal:  Negative for nausea and vomiting.  Musculoskeletal:  Positive for arthralgias.    Objective:  Physical Exam Well nourished and well developed. General: Alert and oriented x3, cooperative and pleasant, no acute distress. Head: normocephalic, atraumatic, neck supple. Eyes: EOMI.  Musculoskeletal: Left hip exam: His exam was not stressed today based on new radiographs. He has limited and painful left hip range of motion He is neurovascular intact distally  Calves soft and nontender. Motor function  intact in LE. Strength 5/5 LE bilaterally. Neuro: Distal pulses 2+. Sensation to light touch intact in LE.  Vital signs in last 24 hours:    Labs:   Estimated body mass index is 25.02 kg/m as calculated from the following:   Height as of 09/28/21: 5\' 6"  (1.676 m).   Weight as of 08/11/21: 70.3 kg.   Imaging Review Plain radiographs demonstrate severe degenerative joint disease of the left hip(s). The bone quality appears to be adequate for age and reported activity level.      Assessment/Plan:  End stage arthritis, left hip(s)  The patient history, physical examination, clinical judgement of the provider and imaging studies are consistent with end stage degenerative joint disease of the left hip(s) and total hip arthroplasty is deemed medically necessary. The treatment options including medical management, injection therapy, arthroscopy and arthroplasty were discussed at length. The risks and benefits of total hip arthroplasty were presented and reviewed. The risks due to aseptic loosening, infection, stiffness, dislocation/subluxation,  thromboembolic complications and other imponderables were discussed.  The patient acknowledged the explanation, agreed to proceed with the plan and consent was signed. Patient is being admitted for inpatient treatment for surgery, pain control, PT, OT, prophylactic antibiotics, VTE  prophylaxis, progressive ambulation and ADL's and discharge planning.The patient is planning to be discharged  home.  Therapy Plans: HEP Disposition: Lives at [facility] Planned DVT Prophylaxis: Eliquis 2.5 BID (a fib) DME needed: walker PCP: Dr. Lyndel Safe Cardiologist: Dr. [name], clearance received TXA: IV Allergies: NKDA Anesthesia Concerns: none BMI: 26.6 Last HgbA1c: Not diabetic  Other: - Hx of a fib on Eliquis (to stop 3 days prior) - Has not been ambulating much at all for 6 months, only to get to restroom - Has pacemaker   Costella Hatcher, PA-C Orthopedic Surgery [facility] [place] [phone]

## 2021-10-04 ENCOUNTER — Encounter (HOSPITAL_COMMUNITY): Payer: Self-pay | Admitting: Orthopedic Surgery

## 2021-10-04 ENCOUNTER — Observation Stay (HOSPITAL_COMMUNITY)
Admission: RE | Admit: 2021-10-04 | Discharge: 2021-10-08 | Disposition: A | Discharge status: Skilled Nursing Facility | Payer: Medicare Other | Attending: Orthopedic Surgery | Admitting: Orthopedic Surgery

## 2021-10-04 ENCOUNTER — Ambulatory Visit (HOSPITAL_COMMUNITY): Payer: Medicare Other | Admitting: Physician Assistant

## 2021-10-04 ENCOUNTER — Ambulatory Visit (HOSPITAL_COMMUNITY): Payer: Medicare Other | Admitting: Certified Registered Nurse Anesthetist

## 2021-10-04 ENCOUNTER — Encounter (HOSPITAL_COMMUNITY)
Admission: RE | Disposition: A | Discharge status: Skilled Nursing Facility | Payer: Self-pay | Source: Home / Self Care | Attending: Orthopedic Surgery

## 2021-10-04 ENCOUNTER — Ambulatory Visit (HOSPITAL_COMMUNITY): Payer: Medicare Other

## 2021-10-04 ENCOUNTER — Other Ambulatory Visit: Payer: Self-pay

## 2021-10-04 ENCOUNTER — Observation Stay (HOSPITAL_COMMUNITY): Payer: Medicare Other

## 2021-10-04 DIAGNOSIS — S82122A Displaced fracture of lateral condyle of left tibia, initial encounter for closed fracture: Secondary | ICD-10-CM

## 2021-10-04 DIAGNOSIS — M1612 Unilateral primary osteoarthritis, left hip: Secondary | ICD-10-CM | POA: Diagnosis not present

## 2021-10-04 DIAGNOSIS — N183 Chronic kidney disease, stage 3 unspecified: Secondary | ICD-10-CM | POA: Diagnosis not present

## 2021-10-04 DIAGNOSIS — I129 Hypertensive chronic kidney disease with stage 1 through stage 4 chronic kidney disease, or unspecified chronic kidney disease: Secondary | ICD-10-CM | POA: Insufficient documentation

## 2021-10-04 DIAGNOSIS — Z87891 Personal history of nicotine dependence: Secondary | ICD-10-CM | POA: Insufficient documentation

## 2021-10-04 DIAGNOSIS — Z950 Presence of cardiac pacemaker: Secondary | ICD-10-CM | POA: Diagnosis not present

## 2021-10-04 DIAGNOSIS — Z96649 Presence of unspecified artificial hip joint: Secondary | ICD-10-CM

## 2021-10-04 DIAGNOSIS — Z79899 Other long term (current) drug therapy: Secondary | ICD-10-CM | POA: Insufficient documentation

## 2021-10-04 DIAGNOSIS — I4891 Unspecified atrial fibrillation: Secondary | ICD-10-CM | POA: Insufficient documentation

## 2021-10-04 DIAGNOSIS — Z20822 Contact with and (suspected) exposure to covid-19: Secondary | ICD-10-CM | POA: Insufficient documentation

## 2021-10-04 DIAGNOSIS — Z96642 Presence of left artificial hip joint: Secondary | ICD-10-CM

## 2021-10-04 HISTORY — PX: TOTAL HIP ARTHROPLASTY: SHX124

## 2021-10-04 LAB — TYPE AND SCREEN
ABO/RH(D): A POS
Antibody Screen: NEGATIVE

## 2021-10-04 LAB — ABO/RH: ABO/RH(D): A POS

## 2021-10-04 SURGERY — ARTHROPLASTY, HIP, TOTAL, ANTERIOR APPROACH
Anesthesia: Spinal | Site: Hip | Laterality: Left

## 2021-10-04 MED ORDER — DIPHENHYDRAMINE HCL 12.5 MG/5ML PO ELIX: 12.5000 mg | ORAL_SOLUTION | ORAL | Status: DC | PRN

## 2021-10-04 MED ORDER — DOCUSATE SODIUM 100 MG PO CAPS
100.0000 mg | ORAL_CAPSULE | Freq: Two times a day (BID) | ORAL | Status: DC
  Administered 2021-10-04 – 2021-10-08 (×8): 100 mg via ORAL
  Filled 2021-10-04 (×8): qty 1

## 2021-10-04 MED ORDER — EPHEDRINE SULFATE-NACL 50-0.9 MG/10ML-% IV SOSY
PREFILLED_SYRINGE | INTRAVENOUS | Status: DC | PRN
  Administered 2021-10-04 (×2): 7.5 mg via INTRAVENOUS
  Administered 2021-10-04: 5 mg via INTRAVENOUS

## 2021-10-04 MED ORDER — PROPOFOL 10 MG/ML IV BOLUS
INTRAVENOUS | Status: DC | PRN
  Administered 2021-10-04 (×4): 10 mg via INTRAVENOUS

## 2021-10-04 MED ORDER — CEFAZOLIN SODIUM-DEXTROSE 2-4 GM/100ML-% IV SOLN
2.0000 g | INTRAVENOUS | Status: AC
  Administered 2021-10-04: 2 g via INTRAVENOUS
  Filled 2021-10-04: qty 100

## 2021-10-04 MED ORDER — TAMSULOSIN HCL 0.4 MG PO CAPS
0.4000 mg | ORAL_CAPSULE | Freq: Every morning | ORAL | Status: DC
  Administered 2021-10-05 – 2021-10-08 (×4): 0.4 mg via ORAL
  Filled 2021-10-04 (×4): qty 1

## 2021-10-04 MED ORDER — METHOCARBAMOL 500 MG IVPB - SIMPLE MED
500.0000 mg | Freq: Four times a day (QID) | INTRAVENOUS | Status: DC | PRN
  Administered 2021-10-04: 500 mg via INTRAVENOUS
  Filled 2021-10-04: qty 50

## 2021-10-04 MED ORDER — SODIUM CHLORIDE 0.9 % IR SOLN
Status: DC | PRN
  Administered 2021-10-04: 1000 mL

## 2021-10-04 MED ORDER — PHENOL 1.4 % MT LIQD: 1.0000 | OROMUCOSAL | Status: DC | PRN

## 2021-10-04 MED ORDER — DEXAMETHASONE SODIUM PHOSPHATE 10 MG/ML IJ SOLN
10.0000 mg | Freq: Once | INTRAMUSCULAR | Status: AC
  Administered 2021-10-05: 10 mg via INTRAVENOUS
  Filled 2021-10-04: qty 1

## 2021-10-04 MED ORDER — ATORVASTATIN CALCIUM 10 MG PO TABS
10.0000 mg | ORAL_TABLET | Freq: Every evening | ORAL | Status: DC
  Administered 2021-10-04 – 2021-10-08 (×4): 10 mg via ORAL
  Filled 2021-10-04 (×4): qty 1

## 2021-10-04 MED ORDER — PROPOFOL 500 MG/50ML IV EMUL
INTRAVENOUS | Status: DC | PRN
  Administered 2021-10-04: 35 ug/kg/min via INTRAVENOUS

## 2021-10-04 MED ORDER — SODIUM CHLORIDE 0.9 % IV SOLN: INTRAVENOUS | Status: DC

## 2021-10-04 MED ORDER — DEXAMETHASONE SODIUM PHOSPHATE 10 MG/ML IJ SOLN
8.0000 mg | Freq: Once | INTRAMUSCULAR | Status: AC
  Administered 2021-10-04: 8 mg via INTRAVENOUS

## 2021-10-04 MED ORDER — BISACODYL 10 MG RE SUPP
10.0000 mg | Freq: Every day | RECTAL | Status: DC | PRN
  Administered 2021-10-07: 10 mg via RECTAL
  Filled 2021-10-04: qty 1

## 2021-10-04 MED ORDER — TRANEXAMIC ACID-NACL 1000-0.7 MG/100ML-% IV SOLN
1000.0000 mg | INTRAVENOUS | Status: AC
  Administered 2021-10-04: 1000 mg via INTRAVENOUS
  Filled 2021-10-04: qty 100

## 2021-10-04 MED ORDER — BUPIVACAINE IN DEXTROSE 0.75-8.25 % IT SOLN
INTRATHECAL | Status: DC | PRN
Start: 2021-10-04 — End: 2021-10-04
  Administered 2021-10-04: 1.4 mL via INTRATHECAL

## 2021-10-04 MED ORDER — CHLORHEXIDINE GLUCONATE 0.12 % MT SOLN
15.0000 mL | Freq: Once | OROMUCOSAL | Status: AC
  Administered 2021-10-04: 15 mL via OROMUCOSAL

## 2021-10-04 MED ORDER — PROPOFOL 1000 MG/100ML IV EMUL
INTRAVENOUS | Status: AC
  Filled 2021-10-04: qty 100

## 2021-10-04 MED ORDER — ONDANSETRON HCL 4 MG/2ML IJ SOLN
INTRAMUSCULAR | Status: DC | PRN
  Administered 2021-10-04: 4 mg via INTRAVENOUS

## 2021-10-04 MED ORDER — MIRTAZAPINE 15 MG PO TABS
7.5000 mg | ORAL_TABLET | Freq: Every day | ORAL | Status: DC
  Administered 2021-10-04 – 2021-10-07 (×4): 7.5 mg via ORAL
  Filled 2021-10-04 (×4): qty 1

## 2021-10-04 MED ORDER — APIXABAN 2.5 MG PO TABS
2.5000 mg | ORAL_TABLET | Freq: Two times a day (BID) | ORAL | Status: DC
  Administered 2021-10-05 – 2021-10-08 (×7): 2.5 mg via ORAL
  Filled 2021-10-04 (×7): qty 1

## 2021-10-04 MED ORDER — STERILE WATER FOR IRRIGATION IR SOLN
Status: DC | PRN
  Administered 2021-10-04 (×2): 1000 mL

## 2021-10-04 MED ORDER — FUROSEMIDE 20 MG PO TABS
20.0000 mg | ORAL_TABLET | Freq: Every day | ORAL | Status: DC
  Administered 2021-10-05 – 2021-10-08 (×4): 20 mg via ORAL
  Filled 2021-10-04 (×4): qty 1

## 2021-10-04 MED ORDER — PROPOFOL 10 MG/ML IV BOLUS
INTRAVENOUS | Status: AC
  Filled 2021-10-04: qty 20

## 2021-10-04 MED ORDER — HYDROCODONE-ACETAMINOPHEN 7.5-325 MG PO TABS
1.0000 | ORAL_TABLET | ORAL | Status: DC | PRN
  Administered 2021-10-05 – 2021-10-08 (×5): 1 via ORAL
  Filled 2021-10-04 (×5): qty 1

## 2021-10-04 MED ORDER — TRANEXAMIC ACID-NACL 1000-0.7 MG/100ML-% IV SOLN
1000.0000 mg | Freq: Once | INTRAVENOUS | Status: AC
  Administered 2021-10-04: 1000 mg via INTRAVENOUS
  Filled 2021-10-04: qty 100

## 2021-10-04 MED ORDER — PHENYLEPHRINE HCL-NACL 20-0.9 MG/250ML-% IV SOLN
INTRAVENOUS | Status: DC | PRN
  Administered 2021-10-04: 25 ug/min via INTRAVENOUS

## 2021-10-04 MED ORDER — POLYETHYLENE GLYCOL 3350 17 G PO PACK
17.0000 g | PACK | Freq: Every day | ORAL | Status: DC | PRN
  Administered 2021-10-05 – 2021-10-08 (×4): 17 g via ORAL
  Filled 2021-10-04 (×5): qty 1

## 2021-10-04 MED ORDER — METOCLOPRAMIDE HCL 5 MG PO TABS: 5.0000 mg | ORAL_TABLET | Freq: Three times a day (TID) | ORAL | Status: DC | PRN

## 2021-10-04 MED ORDER — ONDANSETRON HCL 4 MG/2ML IJ SOLN
INTRAMUSCULAR | Status: AC
  Filled 2021-10-04: qty 2

## 2021-10-04 MED ORDER — METHOCARBAMOL 500 MG PO TABS
500.0000 mg | ORAL_TABLET | Freq: Four times a day (QID) | ORAL | Status: DC | PRN
  Administered 2021-10-05 – 2021-10-08 (×5): 500 mg via ORAL
  Filled 2021-10-04 (×5): qty 1

## 2021-10-04 MED ORDER — DILTIAZEM HCL ER COATED BEADS 120 MG PO CP24
120.0000 mg | ORAL_CAPSULE | Freq: Every day | ORAL | Status: DC
  Administered 2021-10-05 – 2021-10-08 (×4): 120 mg via ORAL
  Filled 2021-10-04 (×4): qty 1

## 2021-10-04 MED ORDER — BUPIVACAINE IN DEXTROSE 0.75-8.25 % IT SOLN
INTRATHECAL | Status: DC | PRN
Start: 2021-10-04 — End: 2021-10-04

## 2021-10-04 MED ORDER — DEXAMETHASONE SODIUM PHOSPHATE 10 MG/ML IJ SOLN
INTRAMUSCULAR | Status: AC
  Filled 2021-10-04: qty 1

## 2021-10-04 MED ORDER — METHOCARBAMOL 500 MG IVPB - SIMPLE MED
INTRAVENOUS | Status: AC
  Filled 2021-10-04: qty 50

## 2021-10-04 MED ORDER — CEFAZOLIN SODIUM-DEXTROSE 2-4 GM/100ML-% IV SOLN
2.0000 g | Freq: Four times a day (QID) | INTRAVENOUS | Status: AC
  Administered 2021-10-04 (×2): 2 g via INTRAVENOUS
  Filled 2021-10-04 (×2): qty 100

## 2021-10-04 MED ORDER — ACETAMINOPHEN 325 MG PO TABS
325.0000 mg | ORAL_TABLET | Freq: Four times a day (QID) | ORAL | Status: DC | PRN
  Administered 2021-10-06: 650 mg via ORAL
  Filled 2021-10-04: qty 2

## 2021-10-04 MED ORDER — LIDOCAINE HCL (PF) 2 % IJ SOLN
INTRAMUSCULAR | Status: AC
  Filled 2021-10-04: qty 5

## 2021-10-04 MED ORDER — ORAL CARE MOUTH RINSE: 15.0000 mL | Freq: Once | OROMUCOSAL | Status: AC

## 2021-10-04 MED ORDER — MORPHINE SULFATE (PF) 2 MG/ML IV SOLN
0.5000 mg | INTRAVENOUS | Status: DC | PRN
  Administered 2021-10-04: 0.5 mg via INTRAVENOUS
  Filled 2021-10-04: qty 1

## 2021-10-04 MED ORDER — FENTANYL CITRATE (PF) 100 MCG/2ML IJ SOLN
INTRAMUSCULAR | Status: AC
  Filled 2021-10-04: qty 2

## 2021-10-04 MED ORDER — AMIODARONE HCL 200 MG PO TABS
200.0000 mg | ORAL_TABLET | Freq: Every day | ORAL | Status: DC
  Administered 2021-10-04 – 2021-10-08 (×5): 200 mg via ORAL
  Filled 2021-10-04 (×5): qty 1

## 2021-10-04 MED ORDER — ONDANSETRON HCL 4 MG PO TABS: 4.0000 mg | ORAL_TABLET | Freq: Four times a day (QID) | ORAL | Status: DC | PRN

## 2021-10-04 MED ORDER — PREDNISONE 5 MG PO TABS
5.0000 mg | ORAL_TABLET | Freq: Every day | ORAL | Status: DC
  Administered 2021-10-04 – 2021-10-08 (×5): 5 mg via ORAL
  Filled 2021-10-04 (×5): qty 1

## 2021-10-04 MED ORDER — ONDANSETRON HCL 4 MG/2ML IJ SOLN: 4.0000 mg | Freq: Four times a day (QID) | INTRAMUSCULAR | Status: DC | PRN

## 2021-10-04 MED ORDER — PANTOPRAZOLE SODIUM 40 MG PO TBEC
40.0000 mg | DELAYED_RELEASE_TABLET | Freq: Every day | ORAL | Status: DC
  Administered 2021-10-05 – 2021-10-08 (×4): 40 mg via ORAL
  Filled 2021-10-04 (×4): qty 1

## 2021-10-04 MED ORDER — PHENYLEPHRINE HCL-NACL 20-0.9 MG/250ML-% IV SOLN
INTRAVENOUS | Status: AC
  Filled 2021-10-04: qty 500

## 2021-10-04 MED ORDER — HYDROCODONE-ACETAMINOPHEN 5-325 MG PO TABS
ORAL_TABLET | ORAL | Status: AC
  Administered 2021-10-04: 1
  Filled 2021-10-04: qty 1

## 2021-10-04 MED ORDER — POVIDONE-IODINE 10 % EX SWAB
2.0000 "application " | Freq: Once | CUTANEOUS | Status: AC
  Administered 2021-10-04: 2 via TOPICAL

## 2021-10-04 MED ORDER — FENTANYL CITRATE PF 50 MCG/ML IJ SOSY: 25.0000 ug | PREFILLED_SYRINGE | INTRAMUSCULAR | Status: DC | PRN

## 2021-10-04 MED ORDER — LACTATED RINGERS IV SOLN: INTRAVENOUS | Status: DC

## 2021-10-04 MED ORDER — PHENYLEPHRINE 40 MCG/ML (10ML) SYRINGE FOR IV PUSH (FOR BLOOD PRESSURE SUPPORT)
PREFILLED_SYRINGE | INTRAVENOUS | Status: DC | PRN
  Administered 2021-10-04: 80 ug via INTRAVENOUS

## 2021-10-04 MED ORDER — FERROUS SULFATE 325 (65 FE) MG PO TABS
325.0000 mg | ORAL_TABLET | Freq: Three times a day (TID) | ORAL | Status: DC
  Administered 2021-10-04 – 2021-10-08 (×9): 325 mg via ORAL
  Filled 2021-10-04 (×9): qty 1

## 2021-10-04 MED ORDER — HYDROCODONE-ACETAMINOPHEN 5-325 MG PO TABS
1.0000 | ORAL_TABLET | ORAL | Status: DC | PRN
  Administered 2021-10-04: 1 via ORAL
  Administered 2021-10-04: 2 via ORAL
  Administered 2021-10-05 – 2021-10-07 (×3): 1 via ORAL
  Filled 2021-10-04 (×2): qty 1
  Filled 2021-10-04: qty 2
  Filled 2021-10-04: qty 1
  Filled 2021-10-04: qty 2
  Filled 2021-10-04: qty 1

## 2021-10-04 MED ORDER — MELATONIN 5 MG PO TABS
5.0000 mg | ORAL_TABLET | Freq: Every day | ORAL | Status: DC
  Administered 2021-10-04 – 2021-10-07 (×4): 5 mg via ORAL
  Filled 2021-10-04 (×4): qty 1

## 2021-10-04 MED ORDER — METOCLOPRAMIDE HCL 5 MG/ML IJ SOLN: 5.0000 mg | Freq: Three times a day (TID) | INTRAMUSCULAR | Status: DC | PRN

## 2021-10-04 MED ORDER — TIMOLOL MALEATE 0.5 % OP SOLN
1.0000 [drp] | Freq: Every day | OPHTHALMIC | Status: DC
  Administered 2021-10-05 – 2021-10-08 (×4): 1 [drp] via OPHTHALMIC
  Filled 2021-10-04: qty 5

## 2021-10-04 MED ORDER — MENTHOL 3 MG MT LOZG: 1.0000 | LOZENGE | OROMUCOSAL | Status: DC | PRN

## 2021-10-04 SURGICAL SUPPLY — 43 items
ADH SKN CLS APL DERMABOND .7 (GAUZE/BANDAGES/DRESSINGS) ×1
BAG COUNTER SPONGE SURGICOUNT (BAG) IMPLANT
BAG DECANTER FOR FLEXI CONT (MISCELLANEOUS) IMPLANT
BAG SPEC THK2 15X12 ZIP CLS (MISCELLANEOUS)
BAG SPNG CNTER NS LX DISP (BAG)
BAG ZIPLOCK 12X15 (MISCELLANEOUS) IMPLANT
BLADE SAG 18X100X1.27 (BLADE) ×2 IMPLANT
COVER PERINEAL POST (MISCELLANEOUS) ×2 IMPLANT
COVER SURGICAL LIGHT HANDLE (MISCELLANEOUS) ×2 IMPLANT
CUP ACETBLR 52 OD PINNACLE (Hips) ×2 IMPLANT
DERMABOND ADVANCED (GAUZE/BANDAGES/DRESSINGS) ×1
DERMABOND ADVANCED .7 DNX12 (GAUZE/BANDAGES/DRESSINGS) ×1 IMPLANT
DRAPE FOOT SWITCH (DRAPES) ×2 IMPLANT
DRAPE STERI IOBAN 125X83 (DRAPES) ×2 IMPLANT
DRAPE U-SHAPE 47X51 STRL (DRAPES) ×4 IMPLANT
DRESSING AQUACEL AG SP 3.5X10 (GAUZE/BANDAGES/DRESSINGS) ×1 IMPLANT
DRSG AQUACEL AG ADV 3.5X10 (GAUZE/BANDAGES/DRESSINGS) ×2 IMPLANT
DRSG AQUACEL AG SP 3.5X10 (GAUZE/BANDAGES/DRESSINGS) ×2
DURAPREP 26ML APPLICATOR (WOUND CARE) ×2 IMPLANT
ELECT REM PT RETURN 15FT ADLT (MISCELLANEOUS) ×2 IMPLANT
ELIMINATOR HOLE APEX DEPUY (Hips) ×2 IMPLANT
GLOVE SURG ENC MOIS LTX SZ7 (GLOVE) ×2 IMPLANT
GLOVE SURG UNDER LTX SZ6.5 (GLOVE) ×2 IMPLANT
GLOVE SURG UNDER POLY LF SZ7.5 (GLOVE) ×2 IMPLANT
GOWN STRL REUS W/TWL LRG LVL3 (GOWN DISPOSABLE) ×4 IMPLANT
HEAD M SROM 36MM PLUS 1.5 (Hips) ×1 IMPLANT
HOLDER FOLEY CATH W/STRAP (MISCELLANEOUS) ×2 IMPLANT
KIT TURNOVER KIT A (KITS) IMPLANT
LINER NEUTRAL 52X36MM PLUS 4 (Liner) ×2 IMPLANT
PACK ANTERIOR HIP CUSTOM (KITS) ×2 IMPLANT
PENCIL SMOKE EVACUATOR (MISCELLANEOUS) IMPLANT
SCREW 6.5MMX35MM (Screw) ×2 IMPLANT
SROM M HEAD 36MM PLUS 1.5 (Hips) ×2 IMPLANT
STEM FEM ACTIS HIGH SZ8 (Stem) ×2 IMPLANT
SUT MNCRL AB 4-0 PS2 18 (SUTURE) ×2 IMPLANT
SUT STRATAFIX 0 PDS 27 VIOLET (SUTURE) ×2
SUT VIC AB 1 CT1 36 (SUTURE) ×6 IMPLANT
SUT VIC AB 2-0 CT1 27 (SUTURE) ×4
SUT VIC AB 2-0 CT1 TAPERPNT 27 (SUTURE) ×2 IMPLANT
SUTURE STRATFX 0 PDS 27 VIOLET (SUTURE) ×1 IMPLANT
TRAY FOLEY MTR SLVR 16FR STAT (SET/KITS/TRAYS/PACK) IMPLANT
TUBE SUCTION HIGH CAP CLEAR NV (SUCTIONS) ×2 IMPLANT
WATER STERILE IRR 1000ML POUR (IV SOLUTION) ×2 IMPLANT

## 2021-10-04 NOTE — Evaluation (Signed)
Physical Therapy Evaluation Patient Details Name: Stephen Cuevas MRN: [id number] DOB: 09-03-1926 Today's Date: 10/04/2021  History of Present Illness  87 yo male s/p L DA THA. PMH: pubic rami fx, pacemaker, HTN,  clavicle fx, tachy-brady syndrome  Clinical Impression  Pt is s/p THA resulting in the deficits listed below (see PT Problem List).  Pt assisted to EOB and with stand-step pivot to chair. Min assist for transfer.  Deferred amb d/t pain. Pt was minimal ambulator prior to surgery d/t hip pain   HR in 80s, SpO2=95-98% on RA, O2 replaced at rest.   Pt is pleasant and motivated, anticipate steady progress.   Pt will benefit from skilled PT to increase their independence and safety with mobility to allow discharge to the venue listed below.         Recommendations for follow up therapy are one component of a multi-disciplinary discharge planning process, led by the attending physician.  Recommendations may be updated based on patient status, additional functional criteria and insurance authorization.  Follow Up Recommendations Follow physician's recommendations for discharge plan and follow up therapies (plan is for SNF at [facility])    Assistance Recommended at Discharge Frequent or constant Supervision/Assistance  Functional Status Assessment Patient has had a recent decline in their functional status and demonstrates the ability to make significant improvements in function in a reasonable and predictable amount of time.  Equipment Recommendations  None recommended by PT    Recommendations for Other Services       Precautions / Restrictions Precautions Precautions: Fall Restrictions Weight Bearing Restrictions: No Other Position/Activity Restrictions: WBAT      Mobility  Bed Mobility Overal bed mobility: Needs Assistance Bed Mobility: Supine to Sit     Supine to sit: Mod assist     General bed mobility comments: assist with LLE and to elevate trunk, incr time, bed  pad used to assist lateral scooting. verbal cues for technique    Transfers Overall transfer level: Needs assistance Equipment used: Rolling walker (2 wheels) Transfers: Sit to/from Stand;Bed to chair/wheelchair/BSC Sit to Stand: Min assist Stand step pivot transfers: Min assist         General transfer comment: min assist for anterior-superior wt shift and to steady during pivot. cues for hand placement and LE position    Ambulation/Gait                  Stairs            Wheelchair Mobility    Modified Rankin (Stroke Patients Only)       Balance Overall balance assessment: Needs assistance Sitting-balance support: No upper extremity supported;Feet supported Sitting balance-Leahy Scale: Fair     Standing balance support: Bilateral upper extremity supported;During functional activity;Reliant on assistive device for balance Standing balance-Leahy Scale: Poor                               Pertinent Vitals/Pain Pain Assessment: 0-10 Pain Location: L hip Pain Descriptors / Indicators: Grimacing;Sore Pain Intervention(s): Limited activity within patient's tolerance;Monitored during session;Premedicated before session;Repositioned    Home Living Family/patient expects to be discharged to:: Skilled nursing facility (at [place] per family) Living Arrangements: Alone               Home Equipment: Rollator (4 wheels);Cane - single point Additional Comments: resides at ALF at [facility].    Prior Function Prior Level of Function : Needs assist  Physical Assist : Mobility (physical)   ADLs (physical): Bathing Mobility Comments: amb short distances to BR, using w/c d/t hx of falls; hasn't had a fall in 13 mos; transfers I'ly ADLs Comments: assist with meds and meals; supervision for bathing     Hand Dominance        Extremity/Trunk Assessment   Upper Extremity Assessment Upper Extremity Assessment: Generalized weakness     Lower Extremity Assessment Lower Extremity Assessment: Generalized weakness       Communication   Communication: HOH  Cognition Arousal/Alertness: Awake/alert Behavior During Therapy: WFL for tasks assessed/performed Overall Cognitive Status: Within Functional Limits for tasks assessed                                          General Comments      Exercises Total Joint Exercises Ankle Circles/Pumps: AROM;10 reps;Both   Assessment/Plan    PT Assessment Patient needs continued PT services  PT Problem List Decreased strength;Decreased range of motion;Decreased activity tolerance;Decreased balance;Decreased mobility;Pain;Decreased knowledge of use of DME       PT Treatment Interventions DME instruction;Therapeutic activities;Gait training;Functional mobility training;Therapeutic exercise;Patient/family education;Balance training    PT Goals (Current goals can be found in the Care Plan section)  Acute Rehab PT Goals Patient Stated Goal: have less pain and be able to walk more PT Goal Formulation: With patient Time For Goal Achievement: 10/17/21 Potential to Achieve Goals: Good    Frequency 7X/week   Barriers to discharge        Co-evaluation               AM-PAC PT "6 Clicks" Mobility  Outcome Measure Help needed turning from your back to your side while in a flat bed without using bedrails?: A Little Help needed moving from lying on your back to sitting on the side of a flat bed without using bedrails?: A Little Help needed moving to and from a bed to a chair (including a wheelchair)?: A Little Help needed standing up from a chair using your arms (e.g., wheelchair or bedside chair)?: A Little Help needed to walk in hospital room?: A Little Help needed climbing 3-5 steps with a railing? : A Little 6 Click Score: 18    End of Session Equipment Utilized During Treatment: Gait belt Activity Tolerance: Patient tolerated treatment well Patient  left: in chair;with call bell/phone within reach;with chair alarm set;with family/visitor present Nurse Communication: Mobility status PT Visit Diagnosis: Other abnormalities of gait and mobility (R26.89)    Time: 5809-9833 PT Time Calculation (min) (ACUTE ONLY): 26 min   Charges:   PT Evaluation $PT Eval Low Complexity: 1 Low PT Treatments $Therapeutic Activity: 8-22 mins        Baxter Flattery, PT  Acute Rehab Dept ([place]) [phone] Pager [phone]  10/04/2021   [facility] 10/04/2021, 3:47 PM

## 2021-10-04 NOTE — Plan of Care (Signed)
  Problem: Education: Goal: Knowledge of General Education information will improve Description: Including pain rating scale, medication(s)/side effects and non-pharmacologic comfort measures Outcome: Progressing   Problem: Activity: Goal: Risk for activity intolerance will decrease Outcome: Progressing   Problem: Pain Managment: Goal: General experience of comfort will improve Outcome: Progressing   

## 2021-10-04 NOTE — Anesthesia Postprocedure Evaluation (Signed)
Anesthesia Post Note  Patient: Stephen Cuevas  Procedure(s) Performed: TOTAL HIP ARTHROPLASTY ANTERIOR APPROACH (Left: Hip)     Patient location during evaluation: PACU Anesthesia Type: Spinal Level of consciousness: awake Pain management: pain level controlled Vital Signs Assessment: post-procedure vital signs reviewed and stable Respiratory status: spontaneous breathing Cardiovascular status: stable Postop Assessment: no headache, no backache, spinal receding, patient able to bend at knees and no apparent nausea or vomiting Anesthetic complications: no   No notable events documented.  Last Vitals:  Vitals:   10/04/21 1045 10/04/21 1100  BP: 106/62 110/63  Pulse: (!) 59 (!) 59  Resp: 20 15  Temp:  (!) 36.3 C  SpO2: 97% 100%    Last Pain:  Vitals:   10/04/21 1100  TempSrc:   PainSc: 0-No pain                 John F Salome Arnt

## 2021-10-04 NOTE — Anesthesia Procedure Notes (Signed)
Procedure Name: MAC Date/Time: 10/04/2021 8:38 AM Performed by: West Pugh, CRNA Pre-anesthesia Checklist: Patient identified, Emergency Drugs available, Suction available, Patient being monitored and Timeout performed Patient Re-evaluated:Patient Re-evaluated prior to induction Preoxygenation: Pre-oxygenation with 100% oxygen Induction Type: IV induction Placement Confirmation: positive ETCO2 Dental Injury: Teeth and Oropharynx as per pre-operative assessment

## 2021-10-04 NOTE — Anesthesia Procedure Notes (Signed)
Spinal  Patient location during procedure: OR Start time: 10/04/2021 8:43 AM End time: 10/04/2021 8:46 AM Reason for block: surgical anesthesia Staffing Performed: anesthesiologist  Anesthesiologist: Lyn Hollingshead, MD Preanesthetic Checklist Completed: patient identified, IV checked, site marked, risks and benefits discussed, surgical consent, monitors and equipment checked, pre-op evaluation and timeout performed Spinal Block Patient position: sitting Prep: DuraPrep and site prepped and draped Patient monitoring: continuous pulse ox and blood pressure Approach: midline Location: L3-4 Injection technique: single-shot Needle Needle type: Pencan  Needle gauge: 24 G Needle length: 10 cm Needle insertion depth: 6 cm Assessment Sensory level: T8 Events: CSF return

## 2021-10-04 NOTE — Transfer of Care (Signed)
Immediate Anesthesia Transfer of Care Note  Patient: Stephen Cuevas  Procedure(s) Performed: TOTAL HIP ARTHROPLASTY ANTERIOR APPROACH (Left: Hip)  Patient Location: PACU  Anesthesia Type:MAC and Spinal  Level of Consciousness: awake, alert , oriented and patient cooperative  Airway & Oxygen Therapy: Patient Spontanous Breathing and Patient connected to face mask oxygen  Post-op Assessment: Report given to RN and Post -op Vital signs reviewed and stable  Post vital signs: Reviewed and stable  Last Vitals:  Vitals Value Taken Time  BP 100/56 10/04/21 1025  Temp    Pulse 58 10/04/21 1028  Resp    SpO2 100 % 10/04/21 1028  Vitals shown include unvalidated device data.  Last Pain:  Vitals:   10/04/21 0718  TempSrc:   PainSc: 0-No pain      Patients Stated Pain Goal: 3 (61/53/79 4327)  Complications: No notable events documented.

## 2021-10-04 NOTE — Discharge Instructions (Signed)

## 2021-10-04 NOTE — Op Note (Signed)
NAME:  Fawaz Borquez                ACCOUNT NO.: [id number]      MEDICAL RECORD NO.: [id number]      FACILITY:  [facility]      PHYSICIAN:  Mauri Pole  DATE OF BIRTH:  26-Jul-1926     DATE OF PROCEDURE:  10/04/2021                                 OPERATIVE REPORT         PREOPERATIVE DIAGNOSIS: Left  hip osteoarthritis.      POSTOPERATIVE DIAGNOSIS:  Left hip osteoarthritis.      PROCEDURE:  Left total hip replacement through an anterior approach   utilizing DePuy THR system, component size 52 mm pinnacle cup, a size 36+4 neutral   Altrex liner, a size 6 Hi Actis stem with a 36+1.5 Articuleze metal head ball      SURGEON:  Pietro Cassis. Alvan Dame, M.D.      ASSISTANT:  Costella Hatcher, PA-C     ANESTHESIA:  Spinal.      SPECIMENS:  None.      COMPLICATIONS:  None.      BLOOD LOSS:  300 cc     DRAINS:  None.      INDICATION OF THE PROCEDURE:  Alick Lecomte is a 87 y.o. male who had   presented to office for evaluation of left hip pain.  Radiographs revealed   progressive degenerative changes with bone-on-bone   articulation of the  hip joint, including subchondral cystic changes and osteophytes.  The patient had painful limited range of   motion significantly affecting their overall quality of life and function.  The patient was failing to    respond to conservative measures including medications and/or injections and activity modification and at this point was ready   to proceed with more definitive measures.  Consent was obtained for   benefit of pain relief.  Specific risks of infection, DVT, component   failure, dislocation, neurovascular injury, and need for revision surgery were reviewed in the office as well discussion of   the anterior versus posterior approach were reviewed.     PROCEDURE IN DETAIL:  The patient was brought to operative theater.   Once adequate anesthesia, preoperative antibiotics, 2 gm of Ancef, 1 gm of Tranexamic  Acid, and 10 mg of Decadron were administered, the patient was positioned supine on the [employer] table.  Once the patient was safely positioned with adequate padding of boney prominences we predraped out the hip, and used fluoroscopy to confirm orientation of the pelvis.      The left hip was then prepped and draped from proximal iliac crest to   mid thigh with a shower curtain technique.      Time-out was performed identifying the patient, planned procedure, and the appropriate extremity.     An incision was then made 2 cm lateral to the   anterior superior iliac spine extending over the orientation of the   tensor fascia lata muscle and sharp dissection was carried down to the   fascia of the muscle.      The fascia was then incised.  The muscle belly was identified and swept   laterally and retractor placed along the superior neck.  Following   cauterization of the circumflex vessels and removing some pericapsular  fat, a second cobra retractor was placed on the inferior neck.  A T-capsulotomy was made along the line of the   superior neck to the trochanteric fossa, then extended proximally and   distally.  Tag sutures were placed and the retractors were then placed   intracapsular.  We then identified the trochanteric fossa and   orientation of my neck cut and then made a neck osteotomy with the femur on traction.  The femoral   head was removed without difficulty or complication.  Traction was let   off and retractors were placed posterior and anterior around the   acetabulum.      The labrum and foveal tissue were debrided.  I began reaming with a 45 mm   reamer and reamed up to 51 mm reamer with good bony bed preparation and a 52 mm  cup was chosen.  The final 52 mm Pinnacle cup was then impacted under fluoroscopy to confirm the depth of penetration and orientation with respect to   Abduction and forward flexion.  A screw was placed into the ilium followed by the hole eliminator.   The final   36+4 neutral Altrex liner was impacted with good visualized rim fit.  The cup was positioned anatomically within the acetabular portion of the pelvis.      At this point, the femur was rolled to 100 degrees.  Further capsule was   released off the inferior aspect of the femoral neck.  I then   released the superior capsule proximally.  With the leg in a neutral position the hook was placed laterally   along the femur under the vastus lateralis origin and elevated manually and then held in position using the hook attachment on the bed.  The leg was then extended and adducted with the leg rolled to 100   degrees of external rotation.  Retractors were placed along the medial calcar and posteriorly over the greater trochanter.  Once the proximal femur was fully   exposed, I used a box osteotome to set orientation.  I then began   broaching with the starting chili pepper broach and passed this by hand and then broached up to 6.  With the 6 broach in place I chose a high offset neck and did several trial reductions.  The offset was appropriate, leg lengths   appeared to be equal best matched with the +1.5 head ball trial confirmed radiographically.   Given these findings, I went ahead and dislocated the hip, repositioned all   retractors and positioned the right hip in the extended and abducted position.  The final 6 Hi Actis stem was   chosen and it was impacted down to the level of neck cut.  Based on this   and the trial reductions, a final 36+1.5 Articuleze metal ball was chosen and   impacted onto a clean and dry trunnion, and the hip was reduced.  The   hip had been irrigated throughout the case again at this point.  I did   reapproximate the superior capsular leaflet to the anterior leaflet   using #1 Vicryl.  The fascia of the   tensor fascia lata muscle was then reapproximated using #1 Vicryl and #0 Stratafix sutures.  The   remaining wound was closed with 2-0 Vicryl and running  4-0 Monocryl.   The hip was cleaned, dried, and dressed sterilely using Dermabond and   Aquacel dressing.  The patient was then brought   to recovery room in  stable condition tolerating the procedure well.    Costella Hatcher, PA-C was present for the entirety of the case involved from   preoperative positioning, perioperative retractor management, general   facilitation of the case, as well as primary wound closure as assistant.            Pietro Cassis Alvan Dame, M.D.        10/04/2021 10:00 AM

## 2021-10-05 ENCOUNTER — Encounter (HOSPITAL_COMMUNITY): Payer: Self-pay | Admitting: Orthopedic Surgery

## 2021-10-05 DIAGNOSIS — M1612 Unilateral primary osteoarthritis, left hip: Secondary | ICD-10-CM | POA: Diagnosis not present

## 2021-10-05 LAB — CBC
HCT: 30.6 % — ABNORMAL LOW (ref 39.0–52.0)
Hemoglobin: 9.8 g/dL — ABNORMAL LOW (ref 13.0–17.0)
MCH: 32.6 pg (ref 26.0–34.0)
MCHC: 32 g/dL (ref 30.0–36.0)
MCV: 101.7 fL — ABNORMAL HIGH (ref 80.0–100.0)
Platelets: 200 10*3/uL (ref 150–400)
RBC: 3.01 MIL/uL — ABNORMAL LOW (ref 4.22–5.81)
RDW: 12.9 % (ref 11.5–15.5)
WBC: 18 10*3/uL — ABNORMAL HIGH (ref 4.0–10.5)
nRBC: 0 % (ref 0.0–0.2)

## 2021-10-05 LAB — BASIC METABOLIC PANEL
Anion gap: 6 (ref 5–15)
BUN: 23 mg/dL (ref 8–23)
CO2: 24 mmol/L (ref 22–32)
Calcium: 8.8 mg/dL — ABNORMAL LOW (ref 8.9–10.3)
Chloride: 104 mmol/L (ref 98–111)
Creatinine, Ser: 1.1 mg/dL (ref 0.61–1.24)
GFR, Estimated: >60 mL/min (ref >60)
Glucose, Bld: 141 mg/dL — ABNORMAL HIGH (ref 70–99)
Potassium: 4.6 mmol/L (ref 3.5–5.1)
Sodium: 134 mmol/L — ABNORMAL LOW (ref 135–145)

## 2021-10-05 MED ORDER — DOCUSATE SODIUM 100 MG PO CAPS: 100.0000 mg | ORAL_CAPSULE | Freq: Two times a day (BID) | ORAL | 0 refills | Status: AC

## 2021-10-05 MED ORDER — HYDROCODONE-ACETAMINOPHEN 5-325 MG PO TABS: 1.0000 | ORAL_TABLET | Freq: Four times a day (QID) | ORAL | 0 refills | Status: DC | PRN

## 2021-10-05 MED ORDER — METHOCARBAMOL 500 MG PO TABS
500.0000 mg | ORAL_TABLET | Freq: Four times a day (QID) | ORAL | 0 refills | Status: DC | PRN
Start: 2021-10-05 — End: 2021-10-05

## 2021-10-05 MED ORDER — METHOCARBAMOL 500 MG PO TABS: 500.0000 mg | ORAL_TABLET | Freq: Three times a day (TID) | ORAL | 0 refills | Status: DC | PRN

## 2021-10-05 NOTE — Progress Notes (Signed)
Physical Therapy Treatment Patient Details Name: Stephen Cuevas MRN: [id number] DOB: September 06, 1926 Today's Date: 10/05/2021   History of Present Illness 87 yo male s/p L DA THA. PMH: pubic rami fx, pacemaker, HTN,  clavicle fx, tachy-brady syndrome    PT Comments    Pt assisted OOB to recliner today and currently min assist for mobility.  Pt typically able to transfer independently and ambulate short distance to bathroom at ALF.  Pt also reports SOB/DOE at baseline due to prolonged pain and inactivity prior to surgery.  Increased time spent discussing current mobility and explaining disposition planning to pt and family.     Recommendations for follow up therapy are one component of a multi-disciplinary discharge planning process, led by the attending physician.  Recommendations may be updated based on patient status, additional functional criteria and insurance authorization.  Follow Up Recommendations  Follow physician's recommendations for discharge plan and follow up therapies     Assistance Recommended at Discharge Frequent or constant Supervision/Assistance  Equipment Recommendations  None recommended by PT    Recommendations for Other Services       Precautions / Restrictions Precautions Precautions: Fall Precaution Comments: pt with DOE at baseline due to presurgery deconditioning Restrictions Weight Bearing Restrictions: No LLE Weight Bearing: Weight bearing as tolerated     Mobility  Bed Mobility Overal bed mobility: Needs Assistance Bed Mobility: Supine to Sit     Supine to sit: Min assist     General bed mobility comments: increased time and effort, light assist for trunk upright    Transfers Overall transfer level: Needs assistance Equipment used: Rolling walker (2 wheels) Transfers: Sit to/from Stand;Bed to chair/wheelchair/BSC Sit to Stand: Min guard     Step pivot transfers: Min guard     General transfer comment: multimodal cues for  technique, increased SOB with activity, pt declined attempting to ambulate at this time    Ambulation/Gait                   Stairs             Wheelchair Mobility    Modified Rankin (Stroke Patients Only)       Balance Overall balance assessment: Needs assistance         Standing balance support: Bilateral upper extremity supported;During functional activity;Reliant on assistive device for balance Standing balance-Leahy Scale: Poor                              Cognition Arousal/Alertness: Awake/alert Behavior During Therapy: WFL for tasks assessed/performed Overall Cognitive Status: Within Functional Limits for tasks assessed                                 General Comments: HOH        Exercises      General Comments        Pertinent Vitals/Pain Pain Assessment: 0-10 Pain Score: 7  Pain Location: L hip (with weight bearing, reports little pain at rest) Pain Descriptors / Indicators: Grimacing;Sore Pain Intervention(s): Monitored during session;Premedicated before session;Repositioned    Home Living                          Prior Function            PT Goals (current goals can now be found in the care plan  section) Progress towards PT goals: Progressing toward goals    Frequency    7X/week      PT Plan Current plan remains appropriate    Co-evaluation              AM-PAC PT "6 Clicks" Mobility   Outcome Measure  Help needed turning from your back to your side while in a flat bed without using bedrails?: A Little Help needed moving from lying on your back to sitting on the side of a flat bed without using bedrails?: A Little Help needed moving to and from a bed to a chair (including a wheelchair)?: A Little Help needed standing up from a chair using your arms (e.g., wheelchair or bedside chair)?: A Little Help needed to walk in hospital room?: A Lot Help needed climbing 3-5 steps with  a railing? : A Lot 6 Click Score: 16    End of Session Equipment Utilized During Treatment: Gait belt Activity Tolerance: Patient limited by fatigue Patient left: in chair;with call bell/phone within reach;with chair alarm set;with family/visitor present Nurse Communication: Mobility status PT Visit Diagnosis: Other abnormalities of gait and mobility (R26.89)     Time: 8937-3428 PT Time Calculation (min) (ACUTE ONLY): 29 min  Charges:  $Therapeutic Activity: 23-37 mins                    Jannette Spanner PT, DPT Acute Rehabilitation Services Pager: [phone] Office: [place] 10/05/2021, 1:55 PM

## 2021-10-05 NOTE — Progress Notes (Signed)
Physical Therapy Treatment Patient Details Name: Stephen Cuevas MRN: [id number] DOB: May 29, 1926 Today's Date: 10/05/2021   History of Present Illness 87 yo male s/p L DA THA. PMH: pubic rami fx, pacemaker, HTN,  clavicle fx, tachy-brady syndrome    PT Comments    Pt reports being uncomfortable in recliner and ready for mobility.  Pt felt too fatigued and too much pain to ambulate this afternoon so assisted safely back to bed.  Pt requiring more assist this afternoon for mobility.    Recommendations for follow up therapy are one component of a multi-disciplinary discharge planning process, led by the attending physician.  Recommendations may be updated based on patient status, additional functional criteria and insurance authorization.  Follow Up Recommendations  Follow physician's recommendations for discharge plan and follow up therapies     Assistance Recommended at Discharge Frequent or constant Supervision/Assistance  Equipment Recommendations  None recommended by PT    Recommendations for Other Services       Precautions / Restrictions Precautions Precautions: Fall Precaution Comments: pt with DOE at baseline due to presurgery deconditioning Restrictions Weight Bearing Restrictions: No LLE Weight Bearing: Weight bearing as tolerated     Mobility  Bed Mobility Overal bed mobility: Needs Assistance Bed Mobility: Sit to Supine     Supine to sit: Min assist Sit to supine: Mod assist   General bed mobility comments: assist for LEs onto bed    Transfers Overall transfer level: Needs assistance Equipment used: Rolling walker (2 wheels) Transfers: Sit to/from Stand;Bed to chair/wheelchair/BSC Sit to Stand: Min assist     Step pivot transfers: Min assist     General transfer comment: multimodal cues for technique, assist for rise and stabilizing; increased SOB with activity, pt took 5-6 steps from recliner to bed    Ambulation/Gait                General Gait Details: pt declined due to pain and fatigue   Stairs             Wheelchair Mobility    Modified Rankin (Stroke Patients Only)       Balance Overall balance assessment: Needs assistance         Standing balance support: Bilateral upper extremity supported;During functional activity;Reliant on assistive device for balance Standing balance-Leahy Scale: Poor                              Cognition Arousal/Alertness: Awake/alert Behavior During Therapy: WFL for tasks assessed/performed Overall Cognitive Status: Within Functional Limits for tasks assessed                                 General Comments: HOH        Exercises      General Comments        Pertinent Vitals/Pain Pain Assessment: 0-10 Pain Score: 8  Pain Location: L hip (with weight bearing) Pain Descriptors / Indicators: Grimacing;Sore Pain Intervention(s): Repositioned;Monitored during session    Home Living                          Prior Function            PT Goals (current goals can now be found in the care plan section) Progress towards PT goals: Progressing toward goals    Frequency    7X/week  PT Plan Current plan remains appropriate    Co-evaluation              AM-PAC PT "6 Clicks" Mobility   Outcome Measure  Help needed turning from your back to your side while in a flat bed without using bedrails?: A Lot Help needed moving from lying on your back to sitting on the side of a flat bed without using bedrails?: A Lot Help needed moving to and from a bed to a chair (including a wheelchair)?: A Lot Help needed standing up from a chair using your arms (e.g., wheelchair or bedside chair)?: A Lot Help needed to walk in hospital room?: A Lot Help needed climbing 3-5 steps with a railing? : Total 6 Click Score: 11    End of Session Equipment Utilized During Treatment: Gait belt Activity Tolerance: Patient limited  by fatigue Patient left: in bed;with call bell/phone within reach;with bed alarm set Nurse Communication: Mobility status PT Visit Diagnosis: Other abnormalities of gait and mobility (R26.89)     Time: 1455-1511 PT Time Calculation (min) (ACUTE ONLY): 16 min  Charges:  $Therapeutic Activity: 8-22 mins                    Jannette Spanner PT, DPT Acute Rehabilitation Services Pager: [phone] Office: [place] 10/05/2021, 4:12 PM

## 2021-10-05 NOTE — Plan of Care (Signed)

## 2021-10-05 NOTE — Progress Notes (Signed)
Patient ID: Stephen Cuevas, male   DOB: [birth year], 87 y.o.   MRN: [id number] Subjective: 1 Day Post-Op Procedure(s) (LRB): TOTAL HIP ARTHROPLASTY ANTERIOR APPROACH (Left)    Patient reports pain as moderate.  No events.  Anxious about getting up and moving around.  He came from assisted living and would like to go back there but alternative is SNF  Objective:   VITALS:   Vitals:   10/05/21 0137 10/05/21 0609  BP: (!) 145/70 (!) 168/73  Pulse: (!) 59 67  Resp: 16 16  Temp: (!) 97.3 F (36.3 C) 97.7 F (36.5 C)  SpO2: 100% 100%    Neurovascular intact Incision: dressing C/D/I  LABS Recent Labs    10/05/21 0325  HGB 9.8*  HCT 30.6*  WBC 18.0*  PLT 200    Recent Labs    10/05/21 0325  NA 134*  K 4.6  BUN 23  CREATININE 1.10  GLUCOSE 141*    No results for input(s): LABPT, INR in the last 72 hours.   Assessment/Plan: 1 Day Post-Op Procedure(s) (LRB): TOTAL HIP ARTHROPLASTY ANTERIOR APPROACH (Left)   Advance diet Up with therapy   He came from assisted living and would like to go back there but alternative is SNF Disposition will be dependent on his functionality Likely he will need to stay at least one more night to determine discharge needs and related to pre-operative deconditioning related to his hip pathology

## 2021-10-05 NOTE — TOC Initial Note (Signed)
Transition of Care [facility]) - Initial/Assessment Note   Patient Details  Name: Stephen Cuevas MRN: [id number] Date of Birth: 09/27/1926  Transition of Care [facility]) CM/SW Contact:    Sherie Don, LCSW Phone Number: 10/05/2021, 11:53 AM  Clinical Narrative: Patient is expected to discharge back to [facility] ALF if he passes PT. CSW spoke with Physicist, medical at [facility] [phone]) to confirm patient can return to the ALF rather than SNF. Per Ms. Wiser, the patient can return to the ALF with the home exercise program (HEP) if he passes with PT. [facility] can accommodate patient for rehab if needed as the [facility] rehab does not have beds at this time. CSW ordered rolling walker from [place] with [employer] for patient as he has a rollator that his family paid for privately. Transportation for [facility] will only run until 4:30pm if patient needs a ride back to the facility.  Expected Discharge Plan: Assisted Living Barriers to Discharge: No Barriers Identified  Patient Goals and CMS Choice Patient states their goals for this hospitalization and ongoing recovery are:: Return to [facility] ALF CMS Medicare.gov Compare Post Acute Care list provided to:: Patient Choice offered to / list presented to : Patient  Expected Discharge Plan and Services Expected Discharge Plan: Assisted Living In-house Referral: Clinical Social Work Post Acute Care Choice: Durable Medical Equipment Living arrangements for the past 2 months: [place] Expected Discharge Date: 10/04/21               DME Arranged: Gilford Rile rolling DME Agency: [employer] Date DME Agency Contacted: 10/05/21 Representative spoke with at DME Agency: Zelphia Cairo  Prior Living Arrangements/Services Living arrangements for the past 2 months: [place] Lives with:: Facility Resident Patient language and need for interpreter reviewed:: Yes Do you feel safe going back to the place where you  live?: Yes      Need for Family Participation in Patient Care: Yes (Comment) Care giver support system in place?: Yes (comment) Criminal Activity/Legal Involvement Pertinent to Current Situation/Hospitalization: No - Comment as needed  Activities of Daily Living Home Assistive Devices/Equipment: Environmental consultant (specify type), Wheelchair ADL Screening (condition at time of admission) Patient's cognitive ability adequate to safely complete daily activities?: Yes Is the patient deaf or have difficulty hearing?: Yes Does the patient have difficulty seeing, even when wearing glasses/contacts?: No Does the patient have difficulty concentrating, remembering, or making decisions?: Yes Patient able to express need for assistance with ADLs?: Yes Does the patient have difficulty dressing or bathing?: No Independently performs ADLs?: Yes (appropriate for developmental age) Does the patient have difficulty walking or climbing stairs?: Yes Weakness of Legs: Both Weakness of Arms/Hands: None  Permission Sought/Granted Permission sought to share information with : Facility Art therapist granted to share information with : Yes, Verbal Permission Granted Permission granted to share info w AGENCY: [facility]  Emotional Assessment Appearance:: Appears stated age Attitude/Demeanor/Rapport: Engaged Affect (typically observed): Accepting Orientation: : Oriented to Self, Oriented to Place, Oriented to  Time, Oriented to Situation Alcohol / Substance Use: Not Applicable Psych Involvement: No (comment)  Admission diagnosis:  S/P total left hip arthroplasty [T36.468] Patient Active Problem List   Diagnosis Date Noted   S/P total left hip arthroplasty 10/04/2021   Peripheral edema 10/01/2020   Hyperlipidemia 09/28/2020   Anemia 09/10/2020   CKD (chronic kidney disease) stage 3, GFR 30-59 ml/min (HCC) 09/10/2020   Hyponatremia 09/10/2020   Urinary frequency 09/10/2020   GERD  (  gastroesophageal reflux disease) 09/10/2020   Insomnia secondary to depression with anxiety 09/10/2020   Slow transit constipation 09/10/2020   Sciatica, left side 09/10/2020   Fall at home, initial encounter 09/05/2020   Essential hypertension 09/05/2020   Atrial fibrillation, chronic ([place]) 09/05/2020   Closed traumatic minimally displaced fracture of shaft of right clavicle 09/05/2020   Pubic ramus fracture, right, closed, initial encounter ([place]) 09/05/2020   Heart block AV complete ([place]) 07/31/2020   Tachy-brady syndrome ([place]) 06/03/2020   Pacemaker [place] [employer] Dual Chamber pacemaker 06/03/2018 06/03/2020   Bilateral inguinal hernia 04/23/2013   PCP:  Virgie Dad, MD Pharmacy:   [facility] [address] [place] [address] Phone: [phone] Fax: [place], [place] [address] Phone: [phone] Fax: [phone]  Readmission Risk Interventions No flowsheet data found.

## 2021-10-05 NOTE — Plan of Care (Signed)
Plan of care reviewed and discussed with the patient. 

## 2021-10-05 NOTE — Progress Notes (Deleted)
Subjective: 1 Day Post-Op Procedure(s) (LRB): TOTAL HIP ARTHROPLASTY ANTERIOR APPROACH (Left) Patient reports pain as mild.   Patient seen in rounds with [name]. Patient is resting in bed on exam today. He is in goods spirits this morning. Foley catheter in place, to be removed today.  We will start therapy today.   Objective: Vital signs in last 24 hours: Temp:  [97.3 F (36.3 C)-98.4 F (36.9 C)] 97.7 F (36.5 C) (11/23 0609) Pulse Rate:  [58-67] 67 (11/23 0609) Resp:  [15-20] 16 (11/23 0609) BP: (97-168)/(54-74) 168/73 (11/23 0609) SpO2:  [97 %-100 %] 100 % (11/23 0609)  Intake/Output from previous day:  Intake/Output Summary (Last 24 hours) at 10/05/2021 2353 Last data filed at 10/05/2021 0600 Gross per 24 hour  Intake 3228.4 ml  Output 950 ml  Net 2278.4 ml     Intake/Output this shift: No intake/output data recorded.  Labs: Recent Labs    10/05/21 0325  HGB 9.8*   Recent Labs    10/05/21 0325  WBC 18.0*  RBC 3.01*  HCT 30.6*  PLT 200   Recent Labs    10/05/21 0325  NA 134*  K 4.6  CL 104  CO2 24  BUN 23  CREATININE 1.10  GLUCOSE 141*  CALCIUM 8.8*   No results for input(s): LABPT, INR in the last 72 hours.  Exam: General - Patient is Alert and Oriented Extremity - Neurologically intact Sensation intact distally Intact pulses distally Dorsiflexion/Plantar flexion intact Dressing - dressing C/D/I Motor Function - intact, moving foot and toes well on exam.   Past Medical History:  Diagnosis Date   Ankle edema    Anxiety    Arthritis    Basal cell carcinoma 11/21/2004   MID FOREHEAD   BCC (basal cell carcinoma of skin) 08/26/2007   (LEFT OUTER, ZYGOMATIC ARCH)(CURET3, EXC)   Dyspnea    Dysrhythmia    a-fib/flutter- on eliquis   GERD (gastroesophageal reflux disease)    no treatment   Hard of hearing    Heart block AV complete ([place]) 07/31/2020   History of kidney stones    Hypertension    Kidney stone    Presence of  permanent cardiac pacemaker 06/03/2020   SCC (squamous cell carcinoma) 07/17/2008   SCC WELL DIFF (RIGHT HELICAL MARGIN)(MOHS)   SCC (squamous cell carcinoma) 03/24/2013   SCC IN SITU (BACK SCALP)(TX AFTER BX)   SCC (squamous cell carcinoma) 03/24/2013   SCC IN SITU (RIGHT SCALP)(TX AFTER BX)   SCC (squamous cell carcinoma) 03/24/2013   SCC IN SITU (MID SCALP)(TX AFTER BX)   SCC (squamous cell carcinoma) 03/24/2013   SCC IN SITU (MID NECK)(TX AFTER BX)   Spinal stenosis    Squamous cell carcinoma of skin 11/25/1997   ant right scalp    Tachy-brady syndrome ([place]) 06/03/2020    Assessment/Plan: 1 Day Post-Op Procedure(s) (LRB): TOTAL HIP ARTHROPLASTY ANTERIOR APPROACH (Left) Principal Problem:   S/P total left hip arthroplasty  Estimated body mass index is 25.28 kg/m as calculated from the following:   Height as of this encounter: 5\' 6"  (1.676 m).   Weight as of this encounter: 71 kg. Advance diet Up with therapy  DVT Prophylaxis -  Eliquis Weight bearing as tolerated.  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   Up with PT today. His goal is to return to assisted living at [facility], but is aware that he may require short term SNF placement  at [facility] if he is not moving safely enough to return to his prior level of assistance. Discharge back to [facility] vs SNF when meeting goals with therapy.    Griffith Citron, PA-C Orthopedic Surgery [phone] 10/05/2021, 8:22 AM

## 2021-10-06 DIAGNOSIS — M1612 Unilateral primary osteoarthritis, left hip: Secondary | ICD-10-CM | POA: Diagnosis not present

## 2021-10-06 LAB — CBC
HCT: 27.7 % — ABNORMAL LOW (ref 39.0–52.0)
Hemoglobin: 9 g/dL — ABNORMAL LOW (ref 13.0–17.0)
MCH: 32.6 pg (ref 26.0–34.0)
MCHC: 32.5 g/dL (ref 30.0–36.0)
MCV: 100.4 fL — ABNORMAL HIGH (ref 80.0–100.0)
Platelets: 170 10*3/uL (ref 150–400)
RBC: 2.76 MIL/uL — ABNORMAL LOW (ref 4.22–5.81)
RDW: 13.2 % (ref 11.5–15.5)
WBC: 17.8 10*3/uL — ABNORMAL HIGH (ref 4.0–10.5)
nRBC: 0 % (ref 0.0–0.2)

## 2021-10-06 NOTE — Progress Notes (Signed)
   Subjective: 2 Days Post-Op Procedure(s) (LRB): TOTAL HIP ARTHROPLASTY ANTERIOR APPROACH (Left) Patient reports pain as moderate.  Pain is in left thigh Plan is to go  assisted living vs SNF  after hospital stay.  Objective: Vital signs in last 24 hours: Temp:  [97.6 F (36.4 C)-98.4 F (36.9 C)] 97.6 F (36.4 C) (11/24 0436) Pulse Rate:  [66-89] 79 (11/24 0436) Resp:  [15-19] 16 (11/24 0436) BP: (124-151)/(65-79) 147/65 (11/24 0436) SpO2:  [95 %-98 %] 97 % (11/24 0436)  Intake/Output from previous day:  Intake/Output Summary (Last 24 hours) at 10/06/2021 0838 Last data filed at 10/06/2021 0600 Gross per 24 hour  Intake 1144.32 ml  Output 1800 ml  Net -655.68 ml    Intake/Output this shift: No intake/output data recorded.  Labs: Recent Labs    10/05/21 0325 10/06/21 0334  HGB 9.8* 9.0*   Recent Labs    10/05/21 0325 10/06/21 0334  WBC 18.0* 17.8*  RBC 3.01* 2.76*  HCT 30.6* 27.7*  PLT 200 170   Recent Labs    10/05/21 0325  NA 134*  K 4.6  CL 104  CO2 24  BUN 23  CREATININE 1.10  GLUCOSE 141*  CALCIUM 8.8*   No results for input(s): LABPT, INR in the last 72 hours.  EXAM General - Patient is Alert, Appropriate, and Oriented Extremity - Neurologically intact Dorsiflexion/Plantar flexion intact No cellulitis present Compartment soft Dressing/Incision - clean, dry, no drainage Motor Function - intact, moving foot and toes well on exam.  Thigh is soft with no deformity  Past Medical History:  Diagnosis Date   Ankle edema    Anxiety    Arthritis    Basal cell carcinoma 11/21/2004   MID FOREHEAD   BCC (basal cell carcinoma of skin) 08/26/2007   (LEFT OUTER, ZYGOMATIC ARCH)(CURET3, EXC)   Dyspnea    Dysrhythmia    a-fib/flutter- on eliquis   GERD (gastroesophageal reflux disease)    no treatment   Hard of hearing    Heart block AV complete ([place]) 07/31/2020   History of kidney stones    Hypertension    Kidney stone    Presence of  permanent cardiac pacemaker 06/03/2020   SCC (squamous cell carcinoma) 07/17/2008   SCC WELL DIFF (RIGHT HELICAL MARGIN)(MOHS)   SCC (squamous cell carcinoma) 03/24/2013   SCC IN SITU (BACK SCALP)(TX AFTER BX)   SCC (squamous cell carcinoma) 03/24/2013   SCC IN SITU (RIGHT SCALP)(TX AFTER BX)   SCC (squamous cell carcinoma) 03/24/2013   SCC IN SITU (MID SCALP)(TX AFTER BX)   SCC (squamous cell carcinoma) 03/24/2013   SCC IN SITU (MID NECK)(TX AFTER BX)   Spinal stenosis    Squamous cell carcinoma of skin 11/25/1997   ant right scalp    Tachy-brady syndrome ([place]) 06/03/2020    Assessment/Plan: 2 Days Post-Op Procedure(s) (LRB): TOTAL HIP ARTHROPLASTY ANTERIOR APPROACH (Left) Principal Problem:   S/P total left hip arthroplasty   Up with therapy Dispo depends on progress with therapy and pain control Weight Bearing As Tolerated left Leg  Gaynelle Arabian 10/06/2021, 8:38 AM

## 2021-10-06 NOTE — Progress Notes (Signed)
Physical Therapy Treatment Patient Details Name: Stephen Cuevas MRN: [id number] DOB: [birth year] Today's Date: 10/06/2021   History of Present Illness 87 yo male s/p L DA THA. PMH: pubic rami fx, pacemaker, HTN,  clavicle fx, tachy-brady syndrome    PT Comments    Pt is slowly progressing  with mobility, he was able to ambulate 15' this morning. Distance limited by 3/4 dyspnea, SaO2 98% on room air, HR 102, family reports DOE is baseline. Performed L THA HEP with min assist. Pt is very pleasant and puts forth good effort.    Recommendations for follow up therapy are one component of a multi-disciplinary discharge planning process, led by the attending physician.  Recommendations may be updated based on patient status, additional functional criteria and insurance authorization.  Follow Up Recommendations  Follow physician's recommendations for discharge plan and follow up therapies     Assistance Recommended at Discharge Frequent or constant Supervision/Assistance  Equipment Recommendations  None recommended by PT    Recommendations for Other Services       Precautions / Restrictions Precautions Precautions: Fall Precaution Comments: pt with DOE at baseline due to presurgery deconditioning Restrictions LLE Weight Bearing: Weight bearing as tolerated     Mobility  Bed Mobility Overal bed mobility: Needs Assistance Bed Mobility: Supine to Sit       Sit to supine: Min assist   General bed mobility comments: min A to raise trunk and pivot hips to EOB    Transfers Overall transfer level: Needs assistance Equipment used: Rolling walker (2 wheels) Transfers: Sit to/from Stand Sit to Stand: Min assist           General transfer comment: multimodal cues for technique, assist for rise and stabilizing    Ambulation/Gait Ambulation/Gait assistance: Min guard Gait Distance (Feet): 15 Feet Assistive device: Rolling walker (2 wheels) Gait Pattern/deviations: Step-to  pattern;Decreased step length - right;Decreased step length - left;Trunk flexed Gait velocity: decr     General Gait Details: distance limited by fatigue, VCs for proximity to RW, 3/4 dyspnea, HR 102, SaO2 98% on room air, family stated DOE is baseline for pt   Chief Strategy Officer    Modified Rankin (Stroke Patients Only)       Balance Overall balance assessment: Needs assistance Sitting-balance support: No upper extremity supported;Feet supported Sitting balance-Leahy Scale: Fair     Standing balance support: Bilateral upper extremity supported;During functional activity;Reliant on assistive device for balance Standing balance-Leahy Scale: Poor                              Cognition Arousal/Alertness: Awake/alert Behavior During Therapy: WFL for tasks assessed/performed Overall Cognitive Status: Within Functional Limits for tasks assessed                                 General Comments: HOH        Exercises Total Joint Exercises Ankle Circles/Pumps: AROM;Both;20 reps Short Arc Quad: Left;AROM;10 reps;Supine Heel Slides: AAROM;Left;20 reps;Supine Hip ABduction/ADduction: AAROM;Left;20 reps;Supine    General Comments        Pertinent Vitals/Pain Pain Score: 6  Pain Location: L hip (with weight bearing) Pain Descriptors / Indicators: Grimacing;Sore Pain Intervention(s): Limited activity within patient's tolerance;Monitored during session;Premedicated before session;Repositioned;Ice applied    Home Living  Prior Function            PT Goals (current goals can now be found in the care plan section) Acute Rehab PT Goals Patient Stated Goal: have less pain and be able to walk more PT Goal Formulation: With patient Time For Goal Achievement: 10/17/21 Potential to Achieve Goals: Good Progress towards PT goals: Progressing toward goals    Frequency    7X/week       PT Plan Current plan remains appropriate    Co-evaluation              AM-PAC PT "6 Clicks" Mobility   Outcome Measure  Help needed turning from your back to your side while in a flat bed without using bedrails?: A Lot Help needed moving from lying on your back to sitting on the side of a flat bed without using bedrails?: A Lot Help needed moving to and from a bed to a chair (including a wheelchair)?: A Little Help needed standing up from a chair using your arms (e.g., wheelchair or bedside chair)?: A Little Help needed to walk in hospital room?: A Little Help needed climbing 3-5 steps with a railing? : Total 6 Click Score: 14    End of Session Equipment Utilized During Treatment: Gait belt Activity Tolerance: Patient limited by fatigue Patient left: with call bell/phone within reach;in chair;with chair alarm set;with family/visitor present Nurse Communication: Mobility status PT Visit Diagnosis: Other abnormalities of gait and mobility (R26.89)     Time: 0940-1005 PT Time Calculation (min) (ACUTE ONLY): 25 min  Charges:  $Gait Training: 8-22 mins $Therapeutic Exercise: 8-22 mins                     Blondell Reveal Kistler PT 10/06/2021  Acute Rehabilitation Services Pager [phone] Office [phone]

## 2021-10-06 NOTE — Plan of Care (Signed)
  Problem: Pain Managment: Goal: General experience of comfort will improve Outcome: Progressing   Problem: Safety: Goal: Ability to remain free from injury will improve Outcome: Progressing   

## 2021-10-06 NOTE — TOC Progression Note (Signed)
Transition of Care [facility]) - Progression Note    Patient Details  Name: Stephen Cuevas MRN: [id number] Date of Birth: February 07, 1926  [facility] CM/SW Contact  Lennart Pall, LCSW Phone Number: 10/06/2021, 11:37 AM  Clinical Narrative:    Spoke with pt and son yesterday to address concerns that pt might have been getting discharged yesterday.  Son did not feel pt was ready for return to his ALF apartment as he would need to be much more independent with mobility to manage at that level.  Per discussions with PA and therapy, assured son that we are all on the same page with concerns and noted that pt may need to dc at SNF level initially if pt doesn't improve considerably within a couple of days.  Pt and son note that, while pt prefers to return to his ALF apt, they understand this may not be a realistic/ safe plan.  Agreed to see how pt does with therapies today.  TOC will follow up to make a definite dc plan with pt and son.  Have alerted social worker at [facility] of current status.  Son is aware that, if pt requires SNF, he would need to do this at [facility] due to bed availability issues.   Expected Discharge Plan: Assisted Living Barriers to Discharge: No Barriers Identified  Expected Discharge Plan and Services Expected Discharge Plan: Assisted Living In-house Referral: Clinical Social Work   Post Acute Care Choice: Durable Medical Equipment Living arrangements for the past 2 months: [place] Expected Discharge Date: 10/06/21               DME Arranged: Gilford Rile rolling DME Agency: [employer] Date DME Agency Contacted: 10/05/21   Representative spoke with at DME Agency: Zelphia Cairo             Social Determinants of Health ([place]) Interventions    Readmission Risk Interventions No flowsheet data found.

## 2021-10-07 DIAGNOSIS — M1612 Unilateral primary osteoarthritis, left hip: Secondary | ICD-10-CM | POA: Diagnosis not present

## 2021-10-07 LAB — RESP PANEL BY RT-PCR (FLU A&B, COVID) ARPGX2
Influenza A by PCR: NEGATIVE
Influenza B by PCR: NEGATIVE
SARS Coronavirus 2 by RT PCR: NEGATIVE

## 2021-10-07 NOTE — Plan of Care (Signed)
Plan of care reviewed and discussed with the patient. 

## 2021-10-07 NOTE — Progress Notes (Addendum)
   Subjective:  Stephen Cuevas is a 87 y.o. male, 3 Days Post-Op    s/p Procedure(s): TOTAL HIP ARTHROPLASTY ANTERIOR APPROACH   Patient reports pain as mild to moderate.  Reports no pain at rest but pain worse with ambulation and transferring to the chair.  Reports he wet the bed 2 times last night as it was hard for him to use the urinal while in a seated position.  Denies numbness or tingling.  Denies fever or chills.  Reports he has not gone to the bathroom since last Sunday.  Reports he is passing gas.  He has been taking MiraLAX and prune juice.  Objective:   VITALS:   Vitals:   10/06/21 0436 10/06/21 1413 10/06/21 2219 10/07/21 0500  BP: (!) 147/65 120/63 (!) 143/69 (!) 155/76  Pulse: 79 66 66 92  Resp: 16 18 16 16   Temp: 97.6 F (36.4 C) 97.7 F (36.5 C) (!) 97.5 F (36.4 C) 97.7 F (36.5 C)  TempSrc: Oral Oral Oral Oral  SpO2: 97% 96% 98% 98%  Weight:      Height:      Constitutional: General Appearance: healthy-appearing, well-nourished, and well-developed. in Hospital bed Level of Distress: no acute distress. Eyes: Lens (normal) clear: both eyes. Head: Head: normocephalic and atraumatic. Lungs: Respiratory effort: no dyspnea. Skin: Inspection and palpation: no rash, lesions Neurologic: Cranial Nerves: grossly intact. Sensation: grossly intact. Psychiatric: Insight: good judgement and insight. Mental Status: normal mood and affect and active and alert. Orientation: to time, place, and person.  LLE:  ABD soft Neurovascular intact Sensation intact distally Intact pulses distally Dorsiflexion/Plantar flexion intact Incision: dressing C/D/I Calf soft.  Able to wiggle all toes.  Aquacel intact  Lab Results  Component Value Date   WBC 17.8 (H) 10/06/2021   HGB 9.0 (L) 10/06/2021   HCT 27.7 (L) 10/06/2021   MCV 100.4 (H) 10/06/2021   PLT 170 10/06/2021   BMET    Component Value Date/Time   NA 134 (L) 10/05/2021 0325   NA 140 10/28/2020 0000   K 4.6  10/05/2021 0325   CL 104 10/05/2021 0325   CO2 24 10/05/2021 0325   GLUCOSE 141 (H) 10/05/2021 0325   BUN 23 10/05/2021 0325   BUN 21 10/28/2020 0000   CREATININE 1.10 10/05/2021 0325   CALCIUM 8.8 (L) 10/05/2021 0325   GFRNONAA >60 10/05/2021 0325     Assessment/Plan: 3 Days Post-Op   Principal Problem:   S/P total left hip arthroplasty   Advance diet Up with therapy Discharge depending on progress with physical therapy however based on discussion with patient and previous PT notes patient would benefit from skilled nursing facility.  Should begin process of placement for SNF. D/C rx is printed.  Patient continues to have concerns with constipation.  He is taking MiraLAX and has suppository ordered PRN and passing gas but no bowel movement since Sunday per patient. Could consider manual disimpaction.   Weightbearing Status: Weightbearing as tolerated with a walker DVT Prophylaxis: Eliquis 2.5 mg twice daily Hemoglobin 9.0 yesterday   Faythe Casa 10/07/2021, 8:43 AM  Jonelle Sidle PA-C  Physician Assistant with Dr. Rogers, [place]

## 2021-10-07 NOTE — TOC Progression Note (Addendum)
Transition of Care [facility]) - Progression Note   Patient Details  Name: Stephen Cuevas MRN: [id number] Date of Birth: Apr 11, 1926  Transition of Care [facility]) CM/SW [place], LCSW Phone Number: 10/07/2021, 3:18 PM  Clinical Narrative: Patient is now expected to discharge to SNF due to limited progress with PT. CSW met with patient and his son, Stephen Cuevas, to discuss rehab. Patient and son are agreeable to SNF and are aware it will be at [facility] rather that the [place] location as there are no rehab beds available at this time.  FL2 done; PASRR verified (patient is E Hinda Kehr in [facility] MUST). CSW started insurance authorization in [place] portal, which is pending. Reference ID # is: [id number]. CSW spoke with Joellen Jersey in admissions at [facility] and confirmed patient can be admitted on Saturday (10/08/21) IF insurance approves SNF. Patient will otherwise need to be admitted on Monday (10/10/21) after insurance is approved. TOC awaiting insurance authorization.  Addendum: Patient has received insurance approval for SNF for 10/07/2021-10/11/2021. COVID test requested. Patient to discharge to [facility] tomorrow pending negative COVID test and discharge summary.  Expected Discharge Plan: Skilled Nursing Facility Barriers to Discharge: Insurance Authorization  Expected Discharge Plan and Services Expected Discharge Plan: [place] In-house Referral: Clinical Social Work Post Acute Care Choice: [place] Living arrangements for the past 2 months: [place] Expected Discharge Date: 10/06/21               DME Arranged: Gilford Rile rolling DME Agency: [employer] Date DME Agency Contacted: 10/05/21 Representative spoke with at DME Agency: Zelphia Cairo  Readmission Risk Interventions No flowsheet data found.

## 2021-10-07 NOTE — Progress Notes (Signed)
Physical Therapy Treatment Patient Details Name: Stephen Cuevas MRN: [id number] DOB: [birth year] Today's Date: 10/07/2021   History of Present Illness 87 yo male s/p L DA THA. PMH: pubic rami fx, pacemaker, HTN,  clavicle fx, tachy-brady syndrome    PT Comments    Pt assisted with ambulating short distance and then performed LE exercises.  Pt requiring at least min assist at this time and reports plan is for SNF (although he still prefers ALF).  Pt provided with encouragement and also stated healing requires time.  Pt very pleasant, cooperative, and motivated.    Recommendations for follow up therapy are one component of a multi-disciplinary discharge planning process, led by the attending physician.  Recommendations may be updated based on patient status, additional functional criteria and insurance authorization.  Follow Up Recommendations  Follow physician's recommendations for discharge plan and follow up therapies     Assistance Recommended at Discharge Frequent or constant Supervision/Assistance  Equipment Recommendations  None recommended by PT    Recommendations for Other Services       Precautions / Restrictions Precautions Precautions: Fall Precaution Comments: pt with DOE at baseline due to presurgery deconditioning Restrictions Other Position/Activity Restrictions: WBAT     Mobility  Bed Mobility Overal bed mobility: Needs Assistance Bed Mobility: Supine to Sit     Supine to sit: Min assist     General bed mobility comments: min A to raise trunk and pivot hips to EOB    Transfers Overall transfer level: Needs assistance Equipment used: Rolling walker (2 wheels) Transfers: Sit to/from Stand Sit to Stand: Min assist           General transfer comment: multimodal cues for technique, assist for rise and stabilizing    Ambulation/Gait Ambulation/Gait assistance: Min guard Gait Distance (Feet): 25 Feet Assistive device: Rolling walker (2  wheels) Gait Pattern/deviations: Step-to pattern;Trunk flexed;Decreased stance time - left Gait velocity: decr     General Gait Details: verbal cues for posture and step length, distance to tolerance, pt fatigues quickly (DOE which is pt's baseline)   Marine scientist Rankin (Stroke Patients Only)       Balance                                            Cognition Arousal/Alertness: Awake/alert Behavior During Therapy: WFL for tasks assessed/performed Overall Cognitive Status: Within Functional Limits for tasks assessed                                 General Comments: [facility]        Exercises Total Joint Exercises Ankle Circles/Pumps: AROM;Both;20 reps Quad Sets: AROM;Both;10 reps Gluteal Sets: AROM;Both;10 reps Heel Slides: AAROM;Left;Supine;15 reps Hip ABduction/ADduction: AAROM;Left;Supine;15 reps Long Arc Quad: AROM;Seated;Left;10 reps    General Comments        Pertinent Vitals/Pain Pain Assessment: 0-10 Pain Score: 8  Pain Location: L hip (with weight bearing) Pain Descriptors / Indicators: Grimacing;Sore Pain Intervention(s): Repositioned;Monitored during session    Home Living                          Prior Function            PT  Goals (current goals can now be found in the care plan section) Progress towards PT goals: Progressing toward goals    Frequency    7X/week      PT Plan Current plan remains appropriate    Co-evaluation              AM-PAC PT "6 Clicks" Mobility   Outcome Measure  Help needed turning from your back to your side while in a flat bed without using bedrails?: A Lot Help needed moving from lying on your back to sitting on the side of a flat bed without using bedrails?: A Lot Help needed moving to and from a bed to a chair (including a wheelchair)?: A Little Help needed standing up from a chair using your arms (e.g., wheelchair  or bedside chair)?: A Little Help needed to walk in hospital room?: A Little Help needed climbing 3-5 steps with a railing? : Total 6 Click Score: 14    End of Session Equipment Utilized During Treatment: Gait belt Activity Tolerance: Patient limited by fatigue Patient left: with call bell/phone within reach;in chair;with chair alarm set;with family/visitor present   PT Visit Diagnosis: Other abnormalities of gait and mobility (R26.89)     Time: 0109-3235 PT Time Calculation (min) (ACUTE ONLY): 19 min  Charges:  $Gait Training: 8-22 mins                    Arlyce Dice, DPT Acute Rehabilitation Services Pager: [phone] Office: [place] 10/07/2021, 1:48 PM

## 2021-10-07 NOTE — NC FL2 (Signed)
[place] LEVEL OF CARE SCREENING TOOL     IDENTIFICATION  Patient Name: Stephen Cuevas Birthdate: [birth year] Sex: male Admission Date (Current Location): 10/04/2021  [facility] and [place] Number:  Herbalist and Address:  [facility],  [place], [place]      Provider Number: [id number]  Attending Physician Name and Address:  Paralee Cancel, MD  Relative Name and Phone Number:  Jah Alarid (son) Ph: [phone]    Current Level of Care: Hospital Recommended Level of Care: [place] Prior Approval Number:    Date Approved/Denied:   PASRR Number: [id number]  Discharge Plan: SNF    Current Diagnoses: Patient Active Problem List   Diagnosis Date Noted   S/P total left hip arthroplasty 10/04/2021   Peripheral edema 10/01/2020   Hyperlipidemia 09/28/2020   Anemia 09/10/2020   CKD (chronic kidney disease) stage 3, GFR 30-59 ml/min ([place]) 09/10/2020   Hyponatremia 09/10/2020   Urinary frequency 09/10/2020   GERD (gastroesophageal reflux disease) 09/10/2020   Insomnia secondary to depression with anxiety 09/10/2020   Slow transit constipation 09/10/2020   Sciatica, left side 09/10/2020   Fall at home, initial encounter 09/05/2020   Essential hypertension 09/05/2020   Atrial fibrillation, chronic ([place]) 09/05/2020   Closed traumatic minimally displaced fracture of shaft of right clavicle 09/05/2020   Pubic ramus fracture, right, closed, initial encounter ([place]) 09/05/2020   Heart block AV complete ([place]) 07/31/2020   Tachy-brady syndrome ([place]) 06/03/2020   Pacemaker [place] [facility] Dual Chamber pacemaker 06/03/2018 06/03/2020   Bilateral inguinal hernia 04/23/2013    Orientation RESPIRATION BLADDER Height & Weight     Self, Time, Situation, Place  Normal Incontinent Weight: 156 lb 9.6 oz (71 kg) Height:  5\' 6"  (167.6 cm)  BEHAVIORAL SYMPTOMS/MOOD NEUROLOGICAL BOWEL NUTRITION STATUS    (N/A)  (N/A) Incontinent Diet (Regular diet)  AMBULATORY STATUS COMMUNICATION OF NEEDS Skin   Limited Assist Verbally Surgical wounds, Other (Comment) (Ecchymosis: bilateral arms)                       Personal Care Assistance Level of Assistance  Bathing, Feeding, Dressing Bathing Assistance: Limited assistance Feeding assistance: Independent Dressing Assistance: Limited assistance     Functional Limitations Info  Sight, Hearing, Speech Sight Info: Impaired Hearing Info: Impaired Speech Info: Adequate    SPECIAL CARE FACTORS FREQUENCY  PT (By licensed PT), OT (By licensed OT)     PT Frequency: 5x's/week OT Frequency: 5x's/week            Contractures Contractures Info: Not present    Additional Factors Info  Code Status, Allergies, Psychotropic Code Status Info: Full Allergies Info: NKA Psychotropic Info: Remeron         Current Medications (10/07/2021):  This is the current hospital active medication list Current Facility-Administered Medications  Medication Dose Route Frequency Provider Last Rate Last Admin   0.9 %  sodium chloride infusion   Intravenous Continuous Irving Copas, PA-C   Stopped at 10/05/21 1051   acetaminophen (TYLENOL) tablet 325-650 mg  325-650 mg Oral Q6H PRN Irving Copas, PA-C   650 mg at 10/06/21 1657   amiodarone (PACERONE) tablet 200 mg  200 mg Oral Daily Irving Copas, PA-C   200 mg at 10/07/21 0825   apixaban (ELIQUIS) tablet 2.5 mg  2.5 mg Oral Q12H Irving Copas, PA-C   2.5 mg at 10/07/21 0819   atorvastatin (LIPITOR)  tablet 10 mg  10 mg Oral QPM Irving Copas, PA-C   10 mg at 10/06/21 1657   bisacodyl (DULCOLAX) suppository 10 mg  10 mg Rectal Daily PRN Irving Copas, PA-C       diltiazem (CARDIZEM CD) 24 hr capsule 120 mg  120 mg Oral Daily Irving Copas, PA-C   120 mg at 10/07/21 7628   diphenhydrAMINE (BENADRYL) 12.5 MG/5ML elixir 12.5-25 mg  12.5-25 mg Oral Q4H PRN Irving Copas, PA-C        docusate sodium (COLACE) capsule 100 mg  100 mg Oral BID Irving Copas, PA-C   100 mg at 10/07/21 3151   ferrous sulfate tablet 325 mg  325 mg Oral TID PC Irving Copas, PA-C   325 mg at 10/07/21 7616   furosemide (LASIX) tablet 20 mg  20 mg Oral Daily Irving Copas, PA-C   20 mg at 10/07/21 0737   HYDROcodone-acetaminophen (NORCO) 7.5-325 MG per tablet 1-2 tablet  1-2 tablet Oral Q4H PRN Irving Copas, PA-C   1 tablet at 10/06/21 1039   HYDROcodone-acetaminophen (NORCO/VICODIN) 5-325 MG per tablet 1-2 tablet  1-2 tablet Oral Q4H PRN Irving Copas, PA-C   1 tablet at 10/07/21 1062   melatonin tablet 5 mg  5 mg Oral QHS Irving Copas, PA-C   5 mg at 10/06/21 2100   menthol-cetylpyridinium (CEPACOL) lozenge 3 mg  1 lozenge Oral PRN Irving Copas, PA-C       Or   phenol (CHLORASEPTIC) mouth spray 1 spray  1 spray Mouth/Throat PRN Irving Copas, PA-C       methocarbamol (ROBAXIN) tablet 500 mg  500 mg Oral Q6H PRN Costella Hatcher R, PA-C   500 mg at 10/07/21 6948   Or   methocarbamol (ROBAXIN) 500 mg in dextrose 5 % 50 mL IVPB  500 mg Intravenous Q6H PRN Irving Copas, PA-C 100 mL/hr at 10/04/21 1207 500 mg at 10/04/21 1207   metoCLOPramide (REGLAN) tablet 5-10 mg  5-10 mg Oral Q8H PRN Irving Copas, PA-C       Or   metoCLOPramide (REGLAN) injection 5-10 mg  5-10 mg Intravenous Q8H PRN Irving Copas, PA-C       mirtazapine (REMERON) tablet 7.5 mg  7.5 mg Oral QHS Irving Copas, PA-C   7.5 mg at 10/06/21 2059   morphine 2 MG/ML injection 0.5-1 mg  0.5-1 mg Intravenous Q2H PRN Irving Copas, PA-C   0.5 mg at 10/04/21 1331   ondansetron (ZOFRAN) tablet 4 mg  4 mg Oral Q6H PRN Irving Copas, PA-C       Or   ondansetron [facility]) injection 4 mg  4 mg Intravenous Q6H PRN Irving Copas, PA-C       pantoprazole (PROTONIX) EC tablet 40 mg  40 mg Oral Daily Irving Copas, PA-C   40 mg at 10/07/21 0819   polyethylene glycol (MIRALAX / GLYCOLAX)  packet 17 g  17 g Oral Daily PRN Irving Copas, PA-C   17 g at 10/07/21 0818   predniSONE (DELTASONE) tablet 5 mg  5 mg Oral Daily Irving Copas, PA-C   5 mg at 10/07/21 5462   tamsulosin (FLOMAX) capsule 0.4 mg  0.4 mg Oral q morning Irving Copas, PA-C   0.4 mg at 10/07/21 0819   timolol (TIMOPTIC) 0.5 % ophthalmic solution 1 drop  1 drop Both Eyes Daily Irving Copas, PA-C  1 drop at 10/07/21 0809     Discharge Medications: Please see discharge summary for a list of discharge medications.  Relevant Imaging Results:  Relevant Lab Results:   Additional Information SSN: [id number]  Sherie Don, LCSW

## 2021-10-07 NOTE — Progress Notes (Addendum)
   Subjective:  Stephen Cuevas is a 87 y.o. male, 4 Days Post-Op    s/p Procedure(s): TOTAL HIP ARTHROPLASTY ANTERIOR APPROACH   Patient reports pain as mild to moderate.  Reports no pain at rest but pain worse with ambulation and transferring to the chair.   Denies numbness or tingling.  Denies fever or chills.  Reports he has not gone to the bathroom since last Sunday.  Reports he continues to pass gas throughout without issues. Denies abdominal pain.  Objective:   VITALS:   Vitals:   10/07/21 0500 10/07/21 1352 10/07/21 2201 10/08/21 0444  BP: (!) 155/76 113/67 (!) 143/69 (!) 167/77  Pulse: 92 75 70 82  Resp: 16 18 16 16   Temp: 97.7 F (36.5 C) 98.2 F (36.8 C) 98.2 F (36.8 C) 98.7 F (37.1 C)  TempSrc: Oral Oral Oral Oral  SpO2: 98% 97% 96% 95%  Weight:      Height:      Constitutional: General Appearance: healthy-appearing, well-nourished, and well-developed. in Hospital bed Level of Distress: no acute distress. Eyes: Lens (normal) clear: both eyes. Head: Head: normocephalic and atraumatic. Lungs: Respiratory effort: no dyspnea. Skin: Inspection and palpation: no rash, lesions Neurologic: Cranial Nerves: grossly intact. Sensation: grossly intact. Psychiatric: Insight: good judgement and insight. Mental Status: normal mood and affect and active and alert. Orientation: to time, place, and person.  LLE:  No TTP over abdomen ABD soft Neurovascular intact Sensation intact distally Intact pulses distally Dorsiflexion/Plantar flexion intact Incision: dressing C/D/I Calf soft.  Able to wiggle all toes.  Aquacel intact  Lab Results  Component Value Date   WBC 17.8 (H) 10/06/2021   HGB 9.0 (L) 10/06/2021   HCT 27.7 (L) 10/06/2021   MCV 100.4 (H) 10/06/2021   PLT 170 10/06/2021   BMET    Component Value Date/Time   NA 134 (L) 10/05/2021 0325   NA 140 10/28/2020 0000   K 4.6 10/05/2021 0325   CL 104 10/05/2021 0325   CO2 24 10/05/2021 0325   GLUCOSE 141  (H) 10/05/2021 0325   BUN 23 10/05/2021 0325   BUN 21 10/28/2020 0000   CREATININE 1.10 10/05/2021 0325   CALCIUM 8.8 (L) 10/05/2021 0325   GFRNONAA >60 10/05/2021 0325     Assessment/Plan: 4 Days Post-Op   Principal Problem:   S/P total left hip arthroplasty   Advance diet Up with therapy Discharge depending on progress with physical therapy however based on discussion with patient and previous PT notes patient would benefit from skilled nursing facility.  Should continue process of placement for SNF. D/C rx is printed.  Weightbearing Status: Weightbearing as tolerated with a walker DVT Prophylaxis: Eliquis 2.5 mg twice daily Hemoglobin 9.0 on 10/06/21   Armond Hang 10/08/2021, 8:58 AM

## 2021-10-08 DIAGNOSIS — M1612 Unilateral primary osteoarthritis, left hip: Secondary | ICD-10-CM | POA: Diagnosis not present

## 2021-10-08 NOTE — Progress Notes (Signed)
Physical Therapy Treatment Patient Details Name: Stephen Cuevas MRN: [id number] DOB: 1926/06/23 Today's Date: 10/08/2021   History of Present Illness 87 yo male s/p L DA THA. PMH: pubic rami fx, pacemaker, HTN,  clavicle fx, tachy-brady syndrome    PT Comments    Pt just back to bed with nursing staff, fatigued. Agreeable to THA HEP. Continue to recommend SNF   Recommendations for follow up therapy are one component of a multi-disciplinary discharge planning process, led by the attending physician.  Recommendations may be updated based on patient status, additional functional criteria and insurance authorization.  Follow Up Recommendations  Follow physician's recommendations for discharge plan and follow up therapies     Assistance Recommended at Discharge Frequent or constant Supervision/Assistance  Equipment Recommendations  None recommended by PT    Recommendations for Other Services       Precautions / Restrictions Precautions Precautions: Fall Precaution Comments: pt with DOE at baseline due to presurgery deconditioning Restrictions Weight Bearing Restrictions: No LLE Weight Bearing: Weight bearing as tolerated Other Position/Activity Restrictions: WBAT     Mobility  Bed Mobility               General bed mobility comments: just back to bed    Transfers                        Ambulation/Gait                   Stairs             Wheelchair Mobility    Modified Rankin (Stroke Patients Only)       Balance                                            Cognition Arousal/Alertness: Awake/alert Behavior During Therapy: WFL for tasks assessed/performed Overall Cognitive Status: Within Functional Limits for tasks assessed                                 General Comments: [facility]        Exercises Total Joint Exercises Ankle Circles/Pumps: AROM;Both;20 reps Quad Sets: AROM;Both;10  reps Gluteal Sets: AROM;Both;10 reps Short Arc Quad: Left;AROM;10 reps;Supine Heel Slides: AAROM;Left;Supine;15 reps Hip ABduction/ADduction: AAROM;Left;Supine;15 reps    General Comments        Pertinent Vitals/Pain Pain Assessment: Faces Faces Pain Scale: Hurts little more Pain Location: L hip Pain Descriptors / Indicators: Grimacing;Sore Pain Intervention(s): Limited activity within patient's tolerance;Monitored during session    Home Living                          Prior Function            PT Goals (current goals can now be found in the care plan section) Acute Rehab PT Goals Patient Stated Goal: have less pain and be able to walk more PT Goal Formulation: With patient Time For Goal Achievement: 10/17/21 Potential to Achieve Goals: Good Progress towards PT goals: Progressing toward goals    Frequency    7X/week      PT Plan Current plan remains appropriate    Co-evaluation              AM-PAC PT "6 Clicks" Mobility  Outcome Measure  Help needed turning from your back to your side while in a flat bed without using bedrails?: A Lot Help needed moving from lying on your back to sitting on the side of a flat bed without using bedrails?: A Lot Help needed moving to and from a bed to a chair (including a wheelchair)?: A Little Help needed standing up from a chair using your arms (e.g., wheelchair or bedside chair)?: A Little Help needed to walk in hospital room?: A Little Help needed climbing 3-5 steps with a railing? : Total 6 Click Score: 14    End of Session   Activity Tolerance: Patient tolerated treatment well Patient left: with call bell/phone within reach;with bed alarm set Nurse Communication: Mobility status PT Visit Diagnosis: Other abnormalities of gait and mobility (R26.89)     Time: 3662-9476 PT Time Calculation (min) (ACUTE ONLY): 24 min  Charges:  $Therapeutic Exercise: 23-37 mins                     Baxter Flattery,  PT  Acute Rehab Dept ([place]) [phone] Pager [phone]  10/08/2021    [facility] 10/08/2021, 11:02 AM

## 2021-10-08 NOTE — Progress Notes (Signed)
Pt stable at time of ems transport. No needs at time of ems transport. Packet sent with pt that included [facility] dnrs and other documents. Family called to notify of ems transport.

## 2021-10-08 NOTE — TOC Transition Note (Signed)
Transition of Care [facility]) - CM/SW Discharge Note   Patient Details  Name: Stephen Cuevas MRN: [id number] Date of Birth: March 10, 1926  [facility]) CM/SW Contact:  Lennart Pall, LCSW Phone Number: 10/08/2021, 10:34 AM   Clinical Narrative:    Pt medically cleared for dc today to SNF bed at [facility].  Have received insurance authorization.  Pt and son aware and agreeable.  PTAR called at 10:30 am.  RN to call report to [phone].  No further TOC needs.   Final next level of care: Skilled Nursing Facility Barriers to Discharge: Barriers Resolved   Patient Goals and CMS Choice Patient states their goals for this hospitalization and ongoing recovery are:: Return to [facility] ALF CMS Medicare.gov Compare Post Acute Care list provided to:: Patient Choice offered to / list presented to : Patient  Discharge Placement              Patient chooses bed at: [facility] Patient to be transferred to facility by: [place] Name of family member notified: son    Discharge Plan and Services In-house Referral: Clinical Social Work   Post Acute Care Choice: [place]          DME Arranged: Gilford Rile rolling DME Agency: [employer] Date DME Agency Contacted: 10/05/21   Representative spoke with at DME Agency: Zelphia Cairo            Social Determinants of Health ([place]) Interventions     Readmission Risk Interventions No flowsheet data found.

## 2021-10-08 NOTE — Progress Notes (Signed)
Rn has attempted to call SNF several times with no success. Message left on voice mail.

## 2021-10-08 NOTE — Plan of Care (Signed)
  Problem: Clinical Measurements: Goal: Respiratory complications will improve Outcome: Progressing   Problem: Clinical Measurements: Goal: Cardiovascular complication will be avoided Outcome: Progressing   Problem: Safety: Goal: Ability to remain free from injury will improve Outcome: Progressing   Problem: Skin Integrity: Goal: Risk for impaired skin integrity will decrease Outcome: Progressing   

## 2021-10-08 NOTE — Plan of Care (Signed)
Pt stable at this time. Pt to d/c to snf today . Pt and family updated on plan of care.

## 2021-10-08 NOTE — Progress Notes (Signed)
Report given to [facility] LPN via telephone with no complications. Pt stable at this time with no needs. EMS has been called for transport. Rn will continue to monitor.

## 2021-10-08 NOTE — Discharge Summary (Signed)
Patient ID: Stephen Cuevas MRN: [id number] DOB/AGE: 1926/10/07 87 y.o.  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   History of kidney stones    Hypertension    Kidney stone    Presence of permanent cardiac pacemaker 06/03/2020   SCC (squamous cell carcinoma) 07/17/2008   SCC WELL DIFF (RIGHT HELICAL MARGIN)(MOHS)   SCC (squamous cell carcinoma) 03/24/2013   SCC IN SITU (BACK SCALP)(TX AFTER BX)   SCC (squamous cell carcinoma) 03/24/2013   SCC IN SITU (RIGHT SCALP)(TX AFTER BX)   SCC (squamous cell carcinoma) 03/24/2013   SCC IN SITU (MID SCALP)(TX AFTER BX)   SCC (squamous cell carcinoma) 03/24/2013   SCC IN SITU (MID NECK)(TX AFTER BX)   Spinal stenosis    Squamous cell carcinoma of skin 11/25/1997   ant right scalp    Tachy-brady syndrome ([place]) 06/03/2020   Discharge Diagnoses:   Principal Problem:   S/P total left hip arthroplasty  Estimated body mass index is 25.28 kg/m as calculated from the following:   Height as of this encounter: 5\' 6"  (1.676 m).   Weight as of this encounter: 71 kg.  Procedure:  Procedure(s) (LRB): TOTAL HIP ARTHROPLASTY ANTERIOR APPROACH (Left)   Consults: None  HPI: Stephen Cuevas is a 87 year old male who was initially evaluated in the office with [name].  After failure of conservative treatment patient elected to proceed with a left total hip arthroplasty for left hip osteoarthritis.  Risk and benefit were  discussed in the office and patient presented to [facility] for surgery.  Patient underwent left total hip arthroplasty on 10/04/2021 and was admitted for postoperative monitoring and physical therapy evaluation.  Laboratory Data: Admission on 10/04/2021  Component Date Value Ref Range Status   ABO/RH(D) 10/04/2021    Final                   Value:A POS Performed at [facility], [place] [address], [place], [place] [address]    WBC 10/05/2021 18.0 (H)  4.0 - 10.5 K/uL Final   RBC 10/05/2021 3.01 (L)  4.22 - 5.81 MIL/uL Final   Hemoglobin 10/05/2021 9.8 (L)  13.0 - 17.0 g/dL Final   HCT 10/05/2021 30.6 (L)  39.0 - 52.0 % Final   MCV 10/05/2021 101.7 (H)  80.0 - 100.0 fL Final   MCH 10/05/2021 32.6  26.0 - 34.0 pg Final   MCHC 10/05/2021 32.0  30.0 - 36.0 g/dL Final   RDW 10/05/2021 12.9  11.5 - 15.5 % Final   Platelets 10/05/2021 200  150 - 400 K/uL Final   nRBC 10/05/2021 0.0  0.0 - 0.2 % Final   Performed at [facility], [place] [address], [place], [place] [address]   Sodium 10/05/2021 134 (L)  135 - 145 mmol/L Final   Potassium 10/05/2021 4.6  3.5 - 5.1 mmol/L Final   Chloride 10/05/2021 104  98 -  111 mmol/L Final   CO2 10/05/2021 24  22 - 32 mmol/L Final   Glucose, Bld 10/05/2021 141 (H)  70 - 99 mg/dL Final   Glucose reference range applies only to samples taken after fasting for at least 8 hours.   BUN 10/05/2021 23  8 - 23 mg/dL Final   Creatinine, Ser 10/05/2021 1.10  0.61 - 1.24 mg/dL Final   Calcium 10/05/2021 8.8 (L)  8.9 - 10.3 mg/dL Final   GFR, Estimated 10/05/2021 >60  >60 mL/min Final   Comment: (NOTE) Calculated using the CKD-EPI Creatinine Equation (2021)    Anion gap 10/05/2021 6  5 - 15 Final   Performed at [facility], [place] [address] [place], [place] [address]   WBC 10/06/2021 17.8 (H)  4.0 - 10.5 K/uL Final   RBC 10/06/2021 2.76 (L)  4.22 - 5.81 MIL/uL Final   Hemoglobin 10/06/2021 9.0 (L)  13.0  - 17.0 g/dL Final   HCT 10/06/2021 27.7 (L)  39.0 - 52.0 % Final   MCV 10/06/2021 100.4 (H)  80.0 - 100.0 fL Final   MCH 10/06/2021 32.6  26.0 - 34.0 pg Final   MCHC 10/06/2021 32.5  30.0 - 36.0 g/dL Final   RDW 10/06/2021 13.2  11.5 - 15.5 % Final   Platelets 10/06/2021 170  150 - 400 K/uL Final   nRBC 10/06/2021 0.0  0.0 - 0.2 % Final   Performed at [facility], [place] [address] [place], [place] [address]   SARS Coronavirus 2 by RT PCR 10/07/2021 NEGATIVE  NEGATIVE Final   Comment: (NOTE) SARS-CoV-2 target nucleic acids are NOT DETECTED.  The SARS-CoV-2 RNA is generally detectable in upper respiratory specimens during the acute phase of infection. The lowest concentration of SARS-CoV-2 viral copies this assay can detect is 138 copies/mL. A negative result does not preclude SARS-Cov-2 infection and should not be used as the sole basis for treatment or other patient management decisions. A negative result may occur with  improper specimen collection/handling, submission of specimen other than nasopharyngeal swab, presence of viral mutation(s) within the areas targeted by this assay, and inadequate number of viral copies(<138 copies/mL). A negative result must be combined with clinical observations, patient history, and epidemiological information. The expected result is Negative.  Fact Sheet for Patients:  EntrepreneurPulse.com.au  Fact Sheet for Healthcare Providers:  IncredibleEmployment.be  This test is no                          t yet approved or cleared by the [place] FDA and  has been authorized for detection and/or diagnosis of SARS-CoV-2 by FDA under an Emergency Use Authorization (EUA). This EUA will remain  in effect (meaning this test can be used) for the duration of the COVID-19 declaration under Section 564(b)(1) of the Act, 21 U.S.C.section 360bbb-3(b)(1), unless the authorization is terminated  or  revoked sooner.       Influenza A by PCR 10/07/2021 NEGATIVE  NEGATIVE Final   Influenza B by PCR 10/07/2021 NEGATIVE  NEGATIVE Final   Comment: (NOTE) The Xpert Xpress SARS-CoV-2/FLU/RSV plus assay is intended as an aid in the diagnosis of influenza from Nasopharyngeal swab specimens and should not be used as a sole basis for treatment. Nasal washings and aspirates are unacceptable for Xpert Xpress SARS-CoV-2/FLU/RSV testing.  Fact Sheet for Patients: EntrepreneurPulse.com.au  Fact Sheet for Healthcare Providers: IncredibleEmployment.be  This test is not yet approved or cleared by the  [place] Architectural technologist and has been authorized for detection and/or diagnosis of SARS-CoV-2 by FDA under an Print production planner (EUA). This EUA will remain in effect (meaning this test can be used) for the duration of the COVID-19 declaration under Section 564(b)(1) of the Act, 21 U.S.C. section 360bbb-3(b)(1), unless the authorization is terminated or revoked.  Performed at [facility], [place] [address], [place], [place] [address]   Orders Only on 09/30/2021  Component Date Value Ref Range Status   SARS Coronavirus 2 09/30/2021 RESULT: NEGATIVE   Final   Comment: RESULT: NEGATIVESARS-CoV-2 INTERPRETATION:A NEGATIVE  test result means that SARS-CoV-2 RNA was not present in the specimen above the limit of detection of this test. This does not preclude a possible SARS-CoV-2 infection and should not be used as the  sole basis for patient management decisions. Negative results must be combined with clinical observations, patient history, and epidemiological information. Optimum specimen types and timing for peak viral levels during infections caused by SARS-CoV-2  have not been determined. Collection of multiple specimens or types of specimens may be necessary to detect virus. Improper specimen collection and handling, sequence variability under  primers/probes, or organism present below the limit of detection may  lead to false negative results. Positive and negative predictive values of testing are highly dependent on prevalence. False negative test results are more likely when prevalence of disease is high.The expected result is NEGATIVE.Fact S                          heet for  Healthcare Providers: LocalChronicle.no Sheet for Patients: SalonLookup.es Reference Range - Negative   Hospital Outpatient Visit on 09/28/2021  Component Date Value Ref Range Status   MRSA, PCR 09/28/2021 NEGATIVE  NEGATIVE Final   Staphylococcus aureus 09/28/2021 NEGATIVE  NEGATIVE Final   Comment: (NOTE) The Xpert SA Assay (FDA approved for NASAL specimens in patients 47 years of age and older), is one component of a comprehensive surveillance program. It is not intended to diagnose infection nor to guide or monitor treatment. Performed at [facility], [place] [address], [place], [place] [address]    WBC 09/28/2021 12.6 (H)  4.0 - 10.5 K/uL Final   RBC 09/28/2021 3.65 (L)  4.22 - 5.81 MIL/uL Final   Hemoglobin 09/28/2021 11.7 (L)  13.0 - 17.0 g/dL Final   HCT 09/28/2021 37.2 (L)  39.0 - 52.0 % Final   MCV 09/28/2021 101.9 (H)  80.0 - 100.0 fL Final   MCH 09/28/2021 32.1  26.0 - 34.0 pg Final   MCHC 09/28/2021 31.5  30.0 - 36.0 g/dL Final   RDW 09/28/2021 13.5  11.5 - 15.5 % Final   Platelets 09/28/2021 257  150 - 400 K/uL Final   nRBC 09/28/2021 0.0  0.0 - 0.2 % Final   Performed at [facility], [place] [address], [place], [place] [address]   Sodium 09/28/2021 137  135 - 145 mmol/L Final   Potassium 09/28/2021 4.3  3.5 - 5.1 mmol/L Final   Chloride 09/28/2021 104  98 - 111 mmol/L Final   CO2 09/28/2021 25  22 - 32 mmol/L Final   Glucose, Bld 09/28/2021 171 (H)  70 - 99 mg/dL Final   Glucose reference range applies only to samples taken after fasting  for at least 8 hours.   BUN 09/28/2021 21  8 - 23 mg/dL Final   Creatinine, Ser 09/28/2021 1.27 (H)  0.61 - 1.24  mg/dL Final   Calcium 09/28/2021 8.9  8.9 - 10.3 mg/dL Final   Total Protein 09/28/2021 6.2 (L)  6.5 - 8.1 g/dL Final   Albumin 09/28/2021 3.4 (L)  3.5 - 5.0 g/dL Final   AST 09/28/2021 26  15 - 41 U/L Final   ALT 09/28/2021 25  0 - 44 U/L Final   Alkaline Phosphatase 09/28/2021 74  38 - 126 U/L Final   Total Bilirubin 09/28/2021 0.7  0.3 - 1.2 mg/dL Final   GFR, Estimated 09/28/2021 53 (L)  >60 mL/min Final   Comment: (NOTE) Calculated using the CKD-EPI Creatinine Equation (2021)    Anion gap 09/28/2021 8  5 - 15 Final   Performed at [facility], [place] [address] [place], [place] [address]   ABO/RH(D) 09/28/2021 A POS   Final   Antibody Screen 09/28/2021 NEG   Final   Sample Expiration 09/28/2021 10/07/2021,2359   Final   Extend sample reason 09/28/2021    Final                   Value:NO TRANSFUSIONS OR PREGNANCY IN THE PAST 3 MONTHS Performed at [place] [address], [place], [place] [address]   Clinical Support on 09/01/2021  Component Date Value Ref Range Status   Date Time Interrogation Session 09/01/2021 [id number]   Final   Pulse Generator Manufacturer 09/01/2021 SJCR   Final   Pulse Gen Model 09/01/2021 2272 Assurity MRI   Final   Pulse Gen Serial Number 09/01/2021 [id number]   Final   Clinic Name 09/01/2021 [facility]   Final   Implantable Pulse Generator Type 09/01/2021 Implantable Pulse Generator   Final   Implantable Pulse Generator Implan* 09/01/2021 [id number]   Final   Implantable Lead Manufacturer 09/01/2021 [facility]   Final   Implantable Lead Model 09/01/2021 [id number] Tendril MRI   Final   Implantable Lead Serial Number 09/01/2021 [id number]   Final   Implantable Lead Implant Date 09/01/2021 [id number]   Final   Implantable Lead Location Detail 1 09/01/2021 UNKNOWN   Final   Implantable Lead Location  09/01/2021 [id number]   Final   Implantable Lead Manufacturer 09/01/2021 [facility]   Final   Implantable Lead Model 09/01/2021 [id number] Tendril MRI   Final   Implantable Lead Serial Number 09/01/2021 [id number]   Final   Implantable Lead Implant Date 09/01/2021 [id number]   Final   Implantable Lead Location Detail 1 09/01/2021 UNKNOWN   Final   Implantable Lead Location 09/01/2021 [id number]   Final   Lead Channel Setting Sensing Sensi* 09/01/2021 2.0  mV Final   Lead Channel Setting Sensing Adapt* 09/01/2021 Fixed Pacing   Final   Lead Channel Setting Pacing Amplit* 09/01/2021 2.0  V Final   Lead Channel Setting Pacing Pulse * 09/01/2021 0.4  ms Final   Lead Channel Setting Pacing Amplit* 09/01/2021 2.5  V Final   Lead Channel Status 09/01/2021 NULL   Final   Lead Channel Impedance Value 09/01/2021 410  ohm Final   Lead Channel Sensing Intrinsic Amp* 09/01/2021 1.4  mV Final   Lead Channel Pacing Threshold Ampl* 09/01/2021 0.875  V Final   Lead Channel Pacing Threshold Puls* 09/01/2021 0.4  ms Final   Lead Channel Status 09/01/2021 NULL   Final   Lead Channel Impedance Value 09/01/2021 490  ohm Final   Lead Channel Sensing Intrinsic Amp* 09/01/2021 6.4  mV Final   Lead Channel Pacing Threshold Ampl* 09/01/2021 1.0  V Final  Lead Channel Pacing Threshold Puls* 09/01/2021 0.4  ms Final   Battery Status 09/01/2021 MOS   Final   Battery Remaining Longevity 09/01/2021 109  mo Final   Battery Remaining Percentage 09/01/2021 92.0  % Final   Battery Voltage 09/01/2021 3.02  V Final   Brady Statistic RA Percent Paced 09/01/2021 38.0  % Final   Brady Statistic RV Percent Paced 09/01/2021 14.0  % Final   Brady Statistic AP VP Percent 09/01/2021 10.0  % Final   Brady Statistic AS VP Percent 09/01/2021 2.1  % Final   Brady Statistic AP VS Percent 09/01/2021 30.0  % Final   Brady Statistic AS VS Percent 09/01/2021 57.0  % Final   Eval Rhythm 09/01/2021 sinus rhythm at 65 bpm   Final     X-Rays:DG Pelvis  Portable  Result Date: 10/04/2021 CLINICAL DATA:  Post LEFT hip arthroplasty EXAM: PORTABLE PELVIS 1-2 VIEWS COMPARISON:  Portable exam 1111 hours compared to intraoperative images of 10/04/2021 as well as earlier study of 09/05/2020 FINDINGS: Osseous demineralization. Components of LEFT hip prosthesis identified without fracture or dislocation. SI joints and RIGHT hip joint space preserved. Atherosclerotic calcifications of the RIGHT iliac arteries. IMPRESSION: LEFT hip prosthesis and osseous demineralization. No acute abnormalities. Electronically Signed   By: Lavonia Dana M.D.   On: 10/04/2021 11:32   DG C-Arm 1-60 Min-No Report  Result Date: 10/04/2021 Fluoroscopy was utilized by the requesting physician.  No radiographic interpretation.   DG HIP UNILAT WITH PELVIS 1V LEFT  Result Date: 10/04/2021 CLINICAL DATA:  Fluoroscopic assistance for left hip arthroplasty EXAM: DG HIP (WITH OR WITHOUT PELVIS) 1V*L* COMPARISON:  None. FINDINGS: Fluoroscopic assistance was provided for left hip arthroplasty. Radiation dose is 1.31 mGy. Fluoroscopic time is 6 seconds. Two images are submitted for review. IMPRESSION: Fluoroscopic assistance was provided for left hip arthroplasty. Electronically Signed   By: Elmer Picker M.D.   On: 10/04/2021 10:16    EKG: Orders placed or performed in visit on 08/11/21   EKG 12-Lead     Hospital Course: Viaan Knippenberg is a 87 y.o. who was admitted to Hospital. They were brought to the operating room on 10/04/2021 and underwent Procedure(s): [place].  Patient tolerated the procedure well and was later transferred to the recovery room and then to the orthopaedic floor for postoperative care.  They were given PO and IV analgesics for pain control following their surgery.  They were given 24 hours of postoperative antibiotics of  Anti-infectives (From admission, onward)    Start     Dose/Rate Route Frequency Ordered Stop    10/04/21 1500  ceFAZolin (ANCEF) IVPB 2g/100 mL premix        2 g 200 mL/hr over 30 Minutes Intravenous Every 6 hours 10/04/21 1226 10/04/21 2138   10/04/21 0700  ceFAZolin (ANCEF) IVPB 2g/100 mL premix        2 g 200 mL/hr over 30 Minutes Intravenous On call to O.R. 10/04/21 7564 10/04/21 0917      and started on DVT prophylaxis in the form of  Eliquis .   PT was ordered for total joint protocol.  Discharge planning consulted to help with postop disposition and equipment needs.  Patient had a uneventful night on the evening of surgery.  They started to get up OOB with therapy on day one. Continued to work with therapy into day two and three.    By day four is determined that the patient required more supervised  care at a skilled nursing facility and arrangements were made for discharge to skilled nursing facility.  Incision was healing well.  Patient was seen in rounds and was ready for discharge to SNF. Patient is experience constipation however is passing gas. Recommend at SNF continuing miralax, stool softeners, and monitoring his constipation.    Diet: Regular diet Activity:WBAT with walker Follow-up:in 2 weeks Disposition - Skilled nursing facility Discharged Condition: good   Discharge Instructions     Call MD / Call 911   Complete by: As directed    If you experience chest pain or shortness of breath, CALL 911 and be transported to the hospital emergency room.  If you develope a fever above 101 F, pus (white drainage) or increased drainage or redness at the wound, or calf pain, call your surgeon's office.   Change dressing   Complete by: As directed    Maintain surgical dressing until follow up in the clinic. If the edges start to pull up, may reinforce with tape. If the dressing is no longer working, may remove and cover with gauze and tape, but must keep the area dry and clean.  Call with any questions or concerns.   Constipation Prevention   Complete by: As directed    Drink  plenty of fluids.  Prune juice may be helpful.  You may use a stool softener, such as Colace (over the counter) 100 mg twice a day.  Use MiraLax (over the counter) for constipation as needed.   Diet - low sodium heart healthy   Complete by: As directed    Increase activity slowly as tolerated   Complete by: As directed    Weight bearing as tolerated with assist device (walker, cane, etc) as directed, use it as long as suggested by your surgeon or therapist, typically at least 4-6 weeks.   Post-operative opioid taper instructions:   Complete by: As directed    POST-OPERATIVE OPIOID TAPER INSTRUCTIONS: It is important to wean off of your opioid medication as soon as possible. If you do not need pain medication after your surgery it is ok to stop day one. Opioids include: Codeine, Hydrocodone(Norco, Vicodin), Oxycodone(Percocet, oxycontin) and hydromorphone amongst others.  Long term and even short term use of opiods can cause: Increased pain response Dependence Constipation Depression Respiratory depression And more.  Withdrawal symptoms can include Flu like symptoms Nausea, vomiting And more Techniques to manage these symptoms Hydrate well Eat regular healthy meals Stay active Use relaxation techniques(deep breathing, meditating, yoga) Do Not substitute Alcohol to help with tapering If you have been on opioids for less than two weeks and do not have pain than it is ok to stop all together.  Plan to wean off of opioids This plan should start within one week post op of your joint replacement. Maintain the same interval or time between taking each dose and first decrease the dose.  Cut the total daily intake of opioids by one tablet each day Next start to increase the time between doses. The last dose that should be eliminated is the evening dose.      TED hose   Complete by: As directed    Use stockings (TED hose) for 2 weeks on both leg(s).  You may remove them at night for  sleeping.      Allergies as of 10/08/2021   No Known Allergies      Medication List     STOP taking these medications    acetaminophen 650 MG CR  tablet Commonly known as: TYLENOL       TAKE these medications    amiodarone 200 MG tablet Commonly known as: PACERONE Take 200 mg by mouth daily.   apixaban 2.5 MG Tabs tablet Commonly known as: Eliquis Take 1 tablet (2.5 mg total) by mouth 2 (two) times daily.   atorvastatin 10 MG tablet Commonly known as: LIPITOR Take 10 mg by mouth every evening.   cyanocobalamin 1000 MCG tablet Take 1,000 mcg by mouth every morning.   diltiazem 120 MG 24 hr capsule Commonly known as: CARDIZEM CD Take 120 mg by mouth daily.   docusate sodium 100 MG capsule Commonly known as: COLACE Take 1 capsule (100 mg total) by mouth 2 (two) times daily.   furosemide 40 MG tablet Commonly known as: LASIX Take 20 mg by mouth daily.   HYDROcodone-acetaminophen 5-325 MG tablet Commonly known as: NORCO/VICODIN Take 1-2 tablets by mouth every 6 (six) hours as needed for severe pain.   magnesium hydroxide 400 MG/5ML suspension Commonly known as: MILK OF MAGNESIA Take 30 mLs by mouth daily as needed for mild constipation.   melatonin 5 MG Tabs Take 5 mg by mouth at bedtime.   methocarbamol 500 MG tablet Commonly known as: ROBAXIN Take 1 tablet (500 mg total) by mouth every 8 (eight) hours as needed for muscle spasms.   mirtazapine 15 MG tablet Commonly known as: REMERON Take 7.5 mg by mouth at bedtime.   omeprazole 20 MG capsule Commonly known as: PRILOSEC Take 20 mg by mouth daily with breakfast.   polyethylene glycol 17 g packet Commonly known as: MIRALAX / GLYCOLAX Take 17 g by mouth daily as needed.   predniSONE 5 MG Tbec Take 5 mg by mouth daily.   PRESERVISION AREDS 2+MULTI VIT PO Take 1 capsule by mouth in the morning and at bedtime.   senna-docusate 8.6-50 MG tablet Commonly known as: Senokot-S Take 1 tablet by mouth  daily.   tamsulosin 0.4 MG Caps capsule Commonly known as: FLOMAX Take 0.4 mg by mouth every morning.   timolol 0.5 % ophthalmic solution Commonly known as: TIMOPTIC Place 1 drop into both eyes daily.   zinc oxide 20 % ointment Apply 1 application topically as needed for irritation.               Durable Medical Equipment  (From admission, onward)           Start     Ordered   10/05/21 1133  For home use only DME Walker rolling  Once       Question Answer Comment  Walker: With 5 Inch Wheels   Patient needs a walker to treat with the following condition S/P total left hip arthroplasty      10/05/21 1132              Discharge Care Instructions  (From admission, onward)           Start     Ordered   10/05/21 0000  Change dressing       Comments: Maintain surgical dressing until follow up in the clinic. If the edges start to pull up, may reinforce with tape. If the dressing is no longer working, may remove and cover with gauze and tape, but must keep the area dry and clean.  Call with any questions or concerns.   10/05/21 1856            Contact information for follow-up providers     Paralee Cancel,  MD. Schedule an appointment as soon as possible for a visit in 2 week(s).   Specialty: Orthopedic Surgery Contact information: [address] [place] [address] [phone]              Contact information for after-discharge care     Destination     [facility] .   Service: Skilled Nursing Contact information: [place] [phone]                     Signed: Jonelle Sidle PA-C  Orthopaedic Surgery 10/08/2021, 9:36 AM

## 2021-10-10 ENCOUNTER — Non-Acute Institutional Stay (SKILLED_NURSING_FACILITY): Payer: Medicare Other | Admitting: Nurse Practitioner

## 2021-10-10 ENCOUNTER — Encounter: Payer: Self-pay | Admitting: Nurse Practitioner

## 2021-10-10 DIAGNOSIS — N1831 Chronic kidney disease, stage 3a: Secondary | ICD-10-CM

## 2021-10-10 DIAGNOSIS — D649 Anemia, unspecified: Secondary | ICD-10-CM

## 2021-10-10 DIAGNOSIS — I10 Essential (primary) hypertension: Secondary | ICD-10-CM

## 2021-10-10 DIAGNOSIS — F5105 Insomnia due to other mental disorder: Secondary | ICD-10-CM

## 2021-10-10 DIAGNOSIS — E785 Hyperlipidemia, unspecified: Secondary | ICD-10-CM

## 2021-10-10 DIAGNOSIS — K5901 Slow transit constipation: Secondary | ICD-10-CM | POA: Diagnosis not present

## 2021-10-10 DIAGNOSIS — R600 Localized edema: Secondary | ICD-10-CM

## 2021-10-10 DIAGNOSIS — R609 Edema, unspecified: Secondary | ICD-10-CM

## 2021-10-10 DIAGNOSIS — R350 Frequency of micturition: Secondary | ICD-10-CM

## 2021-10-10 DIAGNOSIS — Z96642 Presence of left artificial hip joint: Secondary | ICD-10-CM | POA: Diagnosis not present

## 2021-10-10 DIAGNOSIS — I482 Chronic atrial fibrillation, unspecified: Secondary | ICD-10-CM

## 2021-10-10 DIAGNOSIS — F418 Other specified anxiety disorders: Secondary | ICD-10-CM

## 2021-10-10 DIAGNOSIS — Z950 Presence of cardiac pacemaker: Secondary | ICD-10-CM

## 2021-10-10 DIAGNOSIS — M5432 Sciatica, left side: Secondary | ICD-10-CM

## 2021-10-10 DIAGNOSIS — E871 Hypo-osmolality and hyponatremia: Secondary | ICD-10-CM

## 2021-10-10 DIAGNOSIS — K219 Gastro-esophageal reflux disease without esophagitis: Secondary | ICD-10-CM

## 2021-10-10 NOTE — Assessment & Plan Note (Signed)
Hospitalized 10/04/21-10/08/21 left total hip arthroplasty, Norco, Methocarbamol for pain. F/u [name] 2 weeks. Already on Eliquis.

## 2021-10-10 NOTE — Assessment & Plan Note (Signed)
Uses urinal, no urinary retention, continue Tamsulosin

## 2021-10-10 NOTE — Assessment & Plan Note (Signed)
Na 134 10/05/21

## 2021-10-10 NOTE — Assessment & Plan Note (Signed)
Pacemaker 07/04/18, Hx of tachy brady syndrome,   f/u [name], interrogation for PM issues.

## 2021-10-10 NOTE — Assessment & Plan Note (Signed)
Eliquis, CHA2DS2-VAS score >3, f/u Dr. [name], on  Amiodarone 200mg  qd

## 2021-10-10 NOTE — Assessment & Plan Note (Signed)
BLE edema, takes Furosemide, 2016 echocardiogram EF 55-60%> Bun/creat 23/1.10 10/05/21

## 2021-10-10 NOTE — Progress Notes (Addendum)
Location:   SNF [facility] Room Number: 9 Place of Service:  SNF (31) Provider: Lennie Odor Natassia Guthridge NP  Virgie Dad, MD  Patient Care Team: Virgie Dad, MD as PCP - General (Internal Medicine) Lavonna Monarch, MD as Consulting Physician (Dermatology)  Extended Emergency Contact Information Primary Emergency Contact: Michelle Nasuti Address: [address]          [place], [place] [address] Johnnette Litter of [place] Phone: [phone] Work Phone: [phone] Mobile Phone: [phone] Relation: Son Secondary Emergency Contact: Givler,Chris Mobile Phone: [phone] Relation: Son Preferred language: English  Code Status: DNR Goals of care: Advanced Directive information Advanced Directives 10/11/2021  Does Patient Have a Medical Advance Directive? Yes  Type of Paramedic of [place];Living will  Does patient want to make changes to medical advance directive? No - Patient declined  Copy of [place] in Chart? Yes - validated most recent copy scanned in chart (See row information)  Would patient like information on creating a medical advance directive? -  Pre-existing out of facility DNR order (yellow form or pink MOST form) -     Chief Complaint  Patient presents with   Acute Visit    Meds review following hospitalization.     HPI:  Pt is a 87 y.o. male seen today for an acute visit for medication review following hospitalization  Hospitalized 10/04/21-10/08/21 left total hip arthroplasty, Norco, Methocarbamol for pain. F/u [name] 2 weeks. Already on Eliquis.  Constipation, last BM yesterday 10/09/21, Senokot S, Colace, MiraLax             Anemia, mild, Hgb 9.0 10/06/21<<9.2 10/11/21, Normal Vit B12, Fe stores. On B12             Pacemaker 07/04/18, Hx of tachy brady syndrome,   f/u [name], interrogation for PM issues.              AFib, Eliquis, CHA2DS2-VAS score >3, f/u Dr. [name], on  Amiodarone 2681m qd              HTN, takes Diltiazem, Furosemide,  Bun/creat 23/1.2 10/05/21             CKD Bun/creat 23/1.1 10/05/21<<26/1.25 10/11/21             Hyponatremia, Na 134 10/05/21             Urinary frequency, takes Tamsulosin 0.434mqd             Prednisone qd for chronic left sciatica pain.              GERD, takes Omeprazole              Depression/insmonia, takes Mirtazapine 7.81m3mhs             Hyperlipidemia, takes Atorvastatin 51m67m, LDL 75 09/27/20             BLE edema, takes Furosemide, 2016 echocardiogram EF 55-60%> Bun/creat 23/1.10 10/05/21    Past Medical History:  Diagnosis Date   Ankle edema    Anxiety    Arthritis    Basal cell carcinoma 11/21/2004   MID FOREHEAD   BCC (basal cell carcinoma of skin) 08/26/2007   (LEFT OUTER, ZYGOMATIC ARCH)(CURET3, EXC)   Dyspnea    Dysrhythmia    a-fib/flutter- on eliquis   GERD (gastroesophageal reflux disease)    no treatment   Hard of hearing    Heart block AV complete (HCC)[facility]/18/2021  History of kidney stones    Hypertension    Kidney stone    Presence of permanent cardiac pacemaker 06/03/2020   SCC (squamous cell carcinoma) 07/17/2008   SCC WELL DIFF (RIGHT HELICAL MARGIN)(MOHS)   SCC (squamous cell carcinoma) 03/24/2013   SCC IN SITU (BACK SCALP)(TX AFTER BX)   SCC (squamous cell carcinoma) 03/24/2013   SCC IN SITU (RIGHT SCALP)(TX AFTER BX)   SCC (squamous cell carcinoma) 03/24/2013   SCC IN SITU (MID SCALP)(TX AFTER BX)   SCC (squamous cell carcinoma) 03/24/2013   SCC IN SITU (MID NECK)(TX AFTER BX)   Spinal stenosis    Squamous cell carcinoma of skin 11/25/1997   ant right scalp    Tachy-brady syndrome ([place]) 06/03/2020   Past Surgical History:  Procedure Laterality Date   addnoid     CYSTOSCOPY W/ URETEROSCOPY  1980   INSERT / REPLACE / REMOVE PACEMAKER  06/03/2020   KNEE SURGERY     NAILBED REPAIR Left 06/27/2016   Procedure: NAILBED biopsy left thumb;  Surgeon: Daryll Brod, MD;  Location: [facility];  Service: Orthopedics;  Laterality: Left;  FAB   PACEMAKER IMPLANT N/A 06/03/2020   Procedure: PACEMAKER IMPLANT;  Surgeon: Constance Haw, MD;  Location: [facility];  Service: Cardiovascular;  Laterality: N/A;   TONSILLECTOMY     TOTAL HIP ARTHROPLASTY Left 10/04/2021   Procedure: TOTAL HIP ARTHROPLASTY ANTERIOR APPROACH;  Surgeon: Paralee Cancel, MD;  Location: [facility];  Service: Orthopedics;  Laterality: Left;    No Known Allergies  Allergies as of 10/10/2021   No Known Allergies      Medication List        Accurate as of October 10, 2021 11:59 PM. If you have any questions, ask your nurse or doctor.          amiodarone 200 MG tablet Commonly known as: PACERONE Take 200 mg by mouth daily.   apixaban 2.5 MG Tabs tablet Commonly known as: Eliquis Take 1 tablet (2.5 mg total) by mouth 2 (two) times daily.   atorvastatin 10 MG tablet Commonly known as: LIPITOR Take 10 mg by mouth every evening.   cyanocobalamin 1000 MCG tablet Take 1,000 mcg by mouth every morning.   diltiazem 120 MG 24 hr capsule Commonly known as: CARDIZEM CD Take 120 mg by mouth daily.   docusate sodium 100 MG capsule Commonly known as: COLACE Take 1 capsule (100 mg total) by mouth 2 (two) times daily.   furosemide 20 MG tablet Commonly known as: LASIX Take 20 mg by mouth daily.   HYDROcodone-acetaminophen 5-325 MG tablet Commonly known as: NORCO/VICODIN Take 1-2 tablets by mouth every 6 (six) hours as needed for severe pain.   magnesium hydroxide 400 MG/5ML suspension Commonly known as: MILK OF MAGNESIA Take 30 mLs by mouth daily as needed for mild constipation.   melatonin 5 MG Tabs Take 5 mg by mouth at bedtime.   methocarbamol 500 MG tablet Commonly known as: ROBAXIN Take 1 tablet (500 mg total) by mouth every 8 (eight) hours as needed for muscle spasms.   mirtazapine 15 MG tablet Commonly known as: REMERON Take 15 mg by mouth at bedtime.    omeprazole 20 MG capsule Commonly known as: PRILOSEC Take 20 mg by mouth daily with breakfast.   polyethylene glycol 17 g packet Commonly known as: MIRALAX / GLYCOLAX Take 17 g by mouth daily as needed.   predniSONE 5 MG Tbec Take 5 mg by mouth daily.  PRESERVISION AREDS 2+MULTI VIT PO Take 1 capsule by mouth in the morning and at bedtime.   senna-docusate 8.6-50 MG tablet Commonly known as: Senokot-S Take 1 tablet by mouth daily.   tamsulosin 0.4 MG Caps capsule Commonly known as: FLOMAX Take 0.4 mg by mouth every morning.   timolol 0.5 % ophthalmic solution Commonly known as: TIMOPTIC Place 1 drop into both eyes daily.   zinc oxide 20 % ointment Apply 1 application topically as needed for irritation.        Review of Systems  Constitutional:  Positive for appetite change. Negative for activity change and fever.       Decreased appetite.   HENT:  Positive for hearing loss. Negative for congestion and voice change.        Hearing aids.   Eyes:  Negative for visual disturbance.  Respiratory:  Negative for cough and shortness of breath.   Cardiovascular:  Positive for leg swelling. Negative for chest pain and palpitations.  Gastrointestinal:  Negative for abdominal pain, constipation, nausea and vomiting.       R+L inguinal hernia. Last BM yesterday 10/09/21  Genitourinary:  Positive for difficulty urinating and frequency. Negative for dysuria and urgency.       2-3 x/night, urinary leakage uses adult brief or condom cath  Musculoskeletal:  Positive for arthralgias, back pain and gait problem.       Left sciatica pain. Left hip pain.   Skin:  Negative for color change.  Neurological:  Positive for numbness. Negative for speech difficulty, weakness and headaches.       Feels numb in fingers, but able to make fist  Psychiatric/Behavioral:  Negative for confusion and sleep disturbance. The patient is not nervous/anxious.    Immunization History  Administered Date(s)  Administered   Influenza, High Dose Seasonal PF 08/07/2016   Influenza-Unspecified 08/04/2020, 09/06/2021   Moderna SARS-COV2 Booster Vaccination 09/09/2020   Moderna Sars-Covid-2 Vaccination 11/18/2019, 12/15/2019   Pneumococcal-Unspecified 09/13/2020   Pertinent  Health Maintenance Due  Topic Date Due   INFLUENZA VACCINE  Completed   Fall Risk 10/06/2021 10/06/2021 10/07/2021 10/07/2021 10/08/2021  Patient Fall Risk Level _0    Functional Status Survey:    Vitals:   10/10/21 1053  BP: (!) 150/79  Pulse: 82  Resp: 18  Temp: 98.2 F (36.8 C)  SpO2: 96%   There is no height or weight on file to calculate BMI. Physical Exam Vitals and nursing note reviewed.  Constitutional:      Appearance: Normal appearance.  HENT:     Mouth/Throat:     Mouth: Mucous membranes are moist.  Eyes:     Extraocular Movements: Extraocular movements intact.     Conjunctiva/sclera: Conjunctivae normal.     Pupils: Pupils are equal, round, and reactive to light.  Cardiovascular:     Rate and Rhythm: Normal rate and regular rhythm.     Comments: Pacemaker.  Pulmonary:     Effort: Pulmonary effort is normal.     Breath sounds: No rales.  Abdominal:     General: Bowel sounds are normal.     Palpations: Abdomen is soft.     Tenderness: There is no abdominal tenderness.     Hernia: A hernia is present.     Comments: R+L hermina  Musculoskeletal:        General: Tenderness present.     Cervical back: Normal range of motion and neck  supple.     Right lower leg: Edema present.     Left lower leg: Edema present.     Comments:  Edema BLE trace L>R. Left hip pain with movement.   Skin:    General: Skin is warm and dry.     Comments: Left hip surgical incision is covered in dressing.   Neurological:     General: No focal deficit present.     Mental Status: He is alert and oriented to person, place, and time. Mental status is  at baseline.     Gait: Gait abnormal.  Psychiatric:        Mood and Affect: Mood normal.        Behavior: Behavior normal.        Thought Content: Thought content normal.        Judgment: Judgment normal.    Labs reviewed: Recent Labs    10/28/20 0000 09/28/21 1133 10/05/21 0325  NA 140 137 134*  K 3.9 4.3 4.6  CL 108 104 104  CO2 26* 25 24  GLUCOSE  --  171* 141*  BUN _0 CREATININE 1.1 1.27* 1.10  CALCIUM 8.2* 8.9 8.8*   Recent Labs    10/28/20 0000 09/28/21 1133  AST 15 26  ALT 13 25  ALKPHOS 104 74  BILITOT  --  0.7  PROT  --  6.2*  ALBUMIN 3.1* 3.4*   Recent Labs    10/28/20 0000 11/11/20 0000 09/28/21 1133 10/05/21 0325 10/06/21 0334  WBC 7.1   < > 12.6* 18.0* 17.8*  NEUTROABS 3,649.00  --   --   --   --   HGB 9.9*   < > 11.7* 9.8* 9.0*  HCT 29*   < > 37.2* 30.6* 27.7*  MCV  --   --  101.9* 101.7* 100.4*  PLT 157   < > 257 200 170   < > = values in this interval not displayed.   No results found for: TSH No results found for: HGBA1C Lab Results  Component Value Date   CHOL 137 09/27/2020   HDL 44 09/27/2020   LDLCALC 75 09/27/2020   TRIG 92 09/27/2020    Significant Diagnostic Results in last 30 days:  DG Pelvis Portable  Result Date: 10/04/2021 CLINICAL DATA:  Post LEFT hip arthroplasty EXAM: PORTABLE PELVIS 1-2 VIEWS COMPARISON:  Portable exam 1111 hours compared to intraoperative images of 10/04/2021 as well as earlier study of 09/05/2020 FINDINGS: Osseous demineralization. Components of LEFT hip prosthesis identified without fracture or dislocation. SI joints and RIGHT hip joint space preserved. Atherosclerotic calcifications of the RIGHT iliac arteries. IMPRESSION: LEFT hip prosthesis and osseous demineralization. No acute abnormalities. Electronically Signed   By: Lavonia Dana M.D.   On: 10/04/2021 11:32   DG C-Arm 1-60 Min-No Report  Result Date: 10/04/2021 Fluoroscopy was utilized by the requesting physician.  No radiographic  interpretation.   DG HIP UNILAT WITH PELVIS 1V LEFT  Result Date: 10/04/2021 CLINICAL DATA:  Fluoroscopic assistance for left hip arthroplasty EXAM: DG HIP (WITH OR WITHOUT PELVIS) 1V*L* COMPARISON:  None. FINDINGS: Fluoroscopic assistance was provided for left hip arthroplasty. Radiation dose is 1.31 mGy. Fluoroscopic time is 6 seconds. Two images are submitted for review. IMPRESSION: Fluoroscopic assistance was provided for left hip arthroplasty. Electronically Signed   By: Elmer Picker M.D.   On: 10/04/2021 10:16    Assessment/Plan: S/P total left hip arthroplasty Hospitalized 10/04/21-10/08/21 left total hip arthroplasty, Norco, Methocarbamol for pain. F/u  [name] 2 weeks. Already on Eliquis.  Slow transit constipation  last BMP yesterday 10/09/21, Senokot S, Colace, MiraLax  Anemia mild, post opHgb 9.0 10/06/21, Normal Vit B12, Fe stores in past.  On B12. Baseline Hgb 9s. Update CBC/diff.   Pacemaker [employer] MRI Dual Chamber pacemaker 06/03/2018 Pacemaker 07/04/18, Hx of tachy brady syndrome,   f/u [name], interrogation for PM issues.   Atrial fibrillation, chronic (HCC) Eliquis, CHA2DS2-VAS score >3, f/u Dr. [name], on  Amiodarone 236m qd  Essential hypertension Blood pressure is controlled. takes Diltiazem, Furosemide,  Bun/creat 23/1.2 10/05/21  CKD (chronic kidney disease) stage 3, GFR 30-59 ml/min (HCC) Bun/creat 23/1.1 10/05/21, update CMP/eGFR  Hyponatremia Na 134 10/05/21  Urinary frequency Uses urinal, no urinary retention, continue Tamsulosin  Sciatica, left side Chronic Prednisone qd for chronic left sciatica pain.   GERD (gastroesophageal reflux disease) Stable, continue Omeprazole.   Insomnia secondary to depression with anxiety Depression/insmonia, takes Mirtazapine 7.563mqhs  Hyperlipidemia , takes Atorvastatin 107md, LDL 75 09/27/20  Peripheral edema BLE edema, takes Furosemide, 2016 echocardiogram EF 55-60%> Bun/creat  23/1.10 10/05/21      Family/ staff Communication: plan of care reviewed with the patient and charge nurse.   Labs/tests ordered:  none  Time spend 35 minutes.

## 2021-10-10 NOTE — Assessment & Plan Note (Signed)
last BMP yesterday 10/09/21, Senokot S, Colace, MiraLax

## 2021-10-10 NOTE — Assessment & Plan Note (Signed)
Bun/creat 23/1.1 10/05/21, update CMP/eGFR

## 2021-10-10 NOTE — Assessment & Plan Note (Signed)
Blood pressure is controlled. takes Diltiazem, Furosemide,  Bun/creat 23/1.2 10/05/21

## 2021-10-10 NOTE — Assessment & Plan Note (Signed)
Chronic Prednisone qd for chronic left sciatica pain.

## 2021-10-10 NOTE — Assessment & Plan Note (Addendum)
mild, post opHgb 9.0 10/06/21, Normal Vit B12, Fe stores in past.  On B12. Baseline Hgb 9s. Update CBC/diff.

## 2021-10-10 NOTE — Assessment & Plan Note (Signed)
Stable, continue Omeprazole.  

## 2021-10-10 NOTE — Assessment & Plan Note (Signed)
,   takes Atorvastatin 10mg  qd, LDL 75 09/27/20

## 2021-10-10 NOTE — Assessment & Plan Note (Signed)
Depression/insmonia, takes Mirtazapine 7.5mg  qhs

## 2021-10-11 ENCOUNTER — Non-Acute Institutional Stay (SKILLED_NURSING_FACILITY): Payer: Medicare Other | Admitting: Internal Medicine

## 2021-10-11 ENCOUNTER — Encounter: Payer: Self-pay | Admitting: Nurse Practitioner

## 2021-10-11 ENCOUNTER — Encounter: Payer: Self-pay | Admitting: Internal Medicine

## 2021-10-11 DIAGNOSIS — Z96642 Presence of left artificial hip joint: Secondary | ICD-10-CM

## 2021-10-11 DIAGNOSIS — F5105 Insomnia due to other mental disorder: Secondary | ICD-10-CM

## 2021-10-11 DIAGNOSIS — I482 Chronic atrial fibrillation, unspecified: Secondary | ICD-10-CM

## 2021-10-11 DIAGNOSIS — K5901 Slow transit constipation: Secondary | ICD-10-CM

## 2021-10-11 DIAGNOSIS — I442 Atrioventricular block, complete: Secondary | ICD-10-CM

## 2021-10-11 DIAGNOSIS — I495 Sick sinus syndrome: Secondary | ICD-10-CM

## 2021-10-11 DIAGNOSIS — M5442 Lumbago with sciatica, left side: Secondary | ICD-10-CM

## 2021-10-11 DIAGNOSIS — I10 Essential (primary) hypertension: Secondary | ICD-10-CM | POA: Diagnosis not present

## 2021-10-11 DIAGNOSIS — R609 Edema, unspecified: Secondary | ICD-10-CM

## 2021-10-11 DIAGNOSIS — D649 Anemia, unspecified: Secondary | ICD-10-CM | POA: Diagnosis not present

## 2021-10-11 DIAGNOSIS — R350 Frequency of micturition: Secondary | ICD-10-CM

## 2021-10-11 DIAGNOSIS — K219 Gastro-esophageal reflux disease without esophagitis: Secondary | ICD-10-CM

## 2021-10-11 DIAGNOSIS — N401 Enlarged prostate with lower urinary tract symptoms: Secondary | ICD-10-CM

## 2021-10-11 DIAGNOSIS — R600 Localized edema: Secondary | ICD-10-CM

## 2021-10-11 DIAGNOSIS — G8929 Other chronic pain: Secondary | ICD-10-CM

## 2021-10-11 DIAGNOSIS — F418 Other specified anxiety disorders: Secondary | ICD-10-CM

## 2021-10-11 NOTE — Progress Notes (Signed)
Provider:  Veleta Miners MD Location:   [place] Room Number: 17 Place of Service:  SNF (31)  PCP: Virgie Dad, MD Patient Care Team: Virgie Dad, MD as PCP - General (Internal Medicine) Stephen Monarch, MD as Consulting Physician (Dermatology)  Extended Emergency Contact Information Primary Emergency Contact: Stephen Cuevas Address: [address]          [place], [place] [address] Stephen Cuevas of [place] Phone: [phone] Work Phone: [phone] Mobile Phone: [phone] Relation: Son Secondary Emergency Contact: Stephen Cuevas Mobile Phone: [phone] Relation: Son Preferred language: English  Code Status: Managed Care Goals of Care: Advanced Directive information Advanced Directives 10/11/2021  Does Patient Have a Medical Advance Directive? Yes  Type of Paramedic of Tab;Living will  Does patient want to make changes to medical advance directive? No - Patient declined  Copy of [place] in Chart? Yes - validated most recent copy scanned in chart (See row information)  Would patient like information on creating a medical advance directive? -  Pre-existing out of facility DNR order (yellow form or pink MOST form) -      Chief Complaint  Patient presents with   New Admit To SNF    Admission to SNF    HPI: Patient is a 87 y.o. male seen today for admission to SNF for therapy  Admitted to [facility] from 11/22-11/26 for Left Hip Total Arthroplasty   He has h/o  Patient has a history of A. fib on Eliquis, hypertension, hyperlipidemia,   right clavicle and right pubic ramus fracture in 10/21 H/o Tachybrady s/p Pacemaker  Also h/o Chronic Pain in Lower Back  ON Chronic Prednisone per previous PCP  Patient had non remitting pain in his Left Hip. He was admitted on 11/22 and Underwent Arthroplasty same day He did well post op was uneventful  Now in SNF for therapy is  WBAT His main complain today was Pain in his Hip Wants to know if I can schedule some pain meds Already worked with therapy this morning His Appetite is poor Feels tired and not sleeping well  He lives in [place] in [place] and Plans to go back there again    Past Medical History:  Diagnosis Date   Ankle edema    Anxiety    Arthritis    Basal cell carcinoma 11/21/2004   MID FOREHEAD   BCC (basal cell carcinoma of skin) 08/26/2007   (LEFT OUTER, ZYGOMATIC ARCH)(CURET3, EXC)   Dyspnea    Dysrhythmia    a-fib/flutter- on eliquis   GERD (gastroesophageal reflux disease)    no treatment   Hard of hearing    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   History of kidney stones    Hypertension    Kidney stone    Presence of permanent cardiac pacemaker 06/03/2020   SCC (squamous cell carcinoma) 07/17/2008   SCC WELL DIFF (RIGHT HELICAL MARGIN)(MOHS)   SCC (squamous cell carcinoma) 03/24/2013   SCC IN SITU (BACK SCALP)(TX AFTER BX)   SCC (squamous cell carcinoma) 03/24/2013   SCC IN SITU (RIGHT SCALP)(TX AFTER BX)   SCC (squamous cell carcinoma) 03/24/2013   SCC IN SITU (MID SCALP)(TX AFTER BX)   SCC (squamous cell carcinoma) 03/24/2013   SCC IN SITU (MID NECK)(TX AFTER BX)   Spinal stenosis    Squamous cell carcinoma of skin 11/25/1997   ant right scalp    Tachy-brady syndrome ([place]) 06/03/2020   Past Surgical History:  Procedure Laterality Date   addnoid     CYSTOSCOPY W/ URETEROSCOPY  1980   INSERT / REPLACE / REMOVE PACEMAKER  06/03/2020   KNEE SURGERY     NAILBED REPAIR Left 06/27/2016   Procedure: NAILBED biopsy left thumb;  Surgeon: Daryll Brod, MD;  Location: [place];  Service: Orthopedics;  Laterality: Left;  FAB   PACEMAKER IMPLANT N/A 06/03/2020   Procedure: PACEMAKER IMPLANT;  Surgeon: Constance Haw, MD;  Location: [facility];  Service: Cardiovascular;  Laterality: N/A;   TONSILLECTOMY     TOTAL HIP ARTHROPLASTY Left 10/04/2021    Procedure: TOTAL HIP ARTHROPLASTY ANTERIOR APPROACH;  Surgeon: Paralee Cancel, MD;  Location: [facility];  Service: Orthopedics;  Laterality: Left;    reports that he quit smoking about 65 years ago. His smoking use included cigarettes, pipe, and cigars. He has a 30.00 pack-year smoking history. He has never used smokeless tobacco. He reports current alcohol use. He reports that he does not use drugs. Social History   Socioeconomic History   Marital status: Widowed    Spouse name: Not on file   Number of children: 4   Years of education: Not on file   Highest education level: Not on file  Occupational History   Occupation: retired  Tobacco Use   Smoking status: Former    Packs/day: 1.50    Years: 20.00    Pack years: 30.00    Types: Cigarettes, Pipe, Cigars    Quit date: 03/13/1956    Years since quitting: 65.6   Smokeless tobacco: Never  Vaping Use   Vaping Use: Never used  Substance and Sexual Activity   Alcohol use: Yes    Comment: daily- 2 manhattans   Drug use: Never   Sexual activity: Not on file  Other Topics Concern   Not on file  Social History Narrative   Not on file   Social Determinants of Health   Financial Resource Strain: Not on file  Food Insecurity: Not on file  Transportation Needs: Not on file  Physical Activity: Not on file  Stress: Not on file  Social Connections: Not on file  Intimate Partner Violence: Not on file    Functional Status Survey:    Family History  Problem Relation Age of Onset   Heart failure Father    Heart disease Father     Health Maintenance  Topic Date Due   Pneumonia Vaccine 28+ Years old (1 - PCV) 02/14/1932   TETANUS/TDAP  Never done   Zoster Vaccines- Shingrix (1 of 2) Never done   COVID-19 Vaccine (3 - Moderna risk series) 10/07/2020   INFLUENZA VACCINE  Completed   HPV VACCINES  Aged Out    No Known Allergies  Allergies as of 10/11/2021   No Known Allergies      Medication List        Accurate as of  October 11, 2021  8:45 AM. If you have any questions, ask your nurse or doctor.          amiodarone 200 MG tablet Commonly known as: PACERONE Take 200 mg by mouth daily.   apixaban 2.5 MG Tabs tablet Commonly known as: Eliquis Take 1 tablet (2.5 mg total) by mouth 2 (two) times daily.   atorvastatin 10 MG tablet Commonly known as: LIPITOR Take 10 mg by mouth every evening.   cyanocobalamin 1000 MCG tablet Take 1,000 mcg by mouth every morning.   diltiazem 120 MG 24 hr capsule Commonly  known as: CARDIZEM CD Take 120 mg by mouth daily.   docusate sodium 100 MG capsule Commonly known as: COLACE Take 1 capsule (100 mg total) by mouth 2 (two) times daily.   furosemide 20 MG tablet Commonly known as: LASIX Take 20 mg by mouth daily.   HYDROcodone-acetaminophen 5-325 MG tablet Commonly known as: NORCO/VICODIN Take 1-2 tablets by mouth every 6 (six) hours as needed for severe pain.   magnesium hydroxide 400 MG/5ML suspension Commonly known as: MILK OF MAGNESIA Take 30 mLs by mouth daily as needed for mild constipation.   melatonin 5 MG Tabs Take 5 mg by mouth at bedtime.   methocarbamol 500 MG tablet Commonly known as: ROBAXIN Take 1 tablet (500 mg total) by mouth every 8 (eight) hours as needed for muscle spasms.   mirtazapine 15 MG tablet Commonly known as: REMERON Take 7.5 mg by mouth at bedtime.   omeprazole 20 MG capsule Commonly known as: PRILOSEC Take 20 mg by mouth daily with breakfast.   polyethylene glycol 17 g packet Commonly known as: MIRALAX / GLYCOLAX Take 17 g by mouth daily as needed.   predniSONE 5 MG Tbec Take 5 mg by mouth daily.   PRESERVISION AREDS 2+MULTI VIT PO Take 1 capsule by mouth in the morning and at bedtime.   senna-docusate 8.6-50 MG tablet Commonly known as: Senokot-S Take 1 tablet by mouth daily.   tamsulosin 0.4 MG Caps capsule Commonly known as: FLOMAX Take 0.4 mg by mouth every morning.   timolol 0.5 % ophthalmic  solution Commonly known as: TIMOPTIC Place 1 drop into both eyes daily.   zinc oxide 20 % ointment Apply 1 application topically as needed for irritation.        Review of Systems  Constitutional:  Positive for activity change and appetite change.  HENT: Negative.    Respiratory: Negative.    Cardiovascular: Negative.   Gastrointestinal:  Positive for constipation.  Genitourinary: Negative.   Musculoskeletal:  Positive for arthralgias, back pain, gait problem and myalgias.  Skin: Negative.   Neurological:  Positive for weakness.  Psychiatric/Behavioral:  Positive for sleep disturbance.    Vitals:   10/11/21 0829  BP: 135/65  Pulse: 61  Resp: 18  Temp: (!) 97.5 F (36.4 C)  SpO2: 95%  Weight: 162 lb 11.2 oz (73.8 kg)  Height: 5\' 6"  (1.676 m)   Body mass index is 26.26 kg/m. Physical Exam Vitals reviewed.  Constitutional:      Appearance: Normal appearance.  HENT:     Head: Normocephalic.     Mouth/Throat:     Mouth: Mucous membranes are moist.     Pharynx: Oropharynx is clear.  Eyes:     Pupils: Pupils are equal, round, and reactive to light.  Cardiovascular:     Rate and Rhythm: Normal rate. Rhythm irregular.     Pulses: Normal pulses.     Heart sounds: No murmur heard. Pulmonary:     Effort: Pulmonary effort is normal. No respiratory distress.     Breath sounds: Normal breath sounds. No rales.  Abdominal:     General: Abdomen is flat. Bowel sounds are normal.     Palpations: Abdomen is soft.  Musculoskeletal:     Cervical back: Neck supple.     Comments: Mild Swelling Bilateral  Skin:    General: Skin is warm.  Neurological:     General: No focal deficit present.     Mental Status: He is alert and oriented to person, place, and  time.  Psychiatric:        Mood and Affect: Mood normal.        Thought Content: Thought content normal.    Labs reviewed: Basic Metabolic Panel: Recent Labs    10/28/20 0000 09/28/21 1133 10/05/21 0325  NA 140 137  134*  K 3.9 4.3 4.6  CL 108 104 104  CO2 26* 25 24  GLUCOSE  --  171* 141*  BUN 21 21 23   CREATININE 1.1 1.27* 1.10  CALCIUM 8.2* 8.9 8.8*   Liver Function Tests: Recent Labs    10/28/20 0000 09/28/21 1133  AST 15 26  ALT 13 25  ALKPHOS 104 74  BILITOT  --  0.7  PROT  --  6.2*  ALBUMIN 3.1* 3.4*   No results for input(s): LIPASE, AMYLASE in the last 8760 hours. No results for input(s): AMMONIA in the last 8760 hours. CBC: Recent Labs    10/28/20 0000 11/11/20 0000 09/28/21 1133 10/05/21 0325 10/06/21 0334  WBC 7.1   < > 12.6* 18.0* 17.8*  NEUTROABS 3,649.00  --   --   --   --   HGB 9.9*   < > 11.7* 9.8* 9.0*  HCT 29*   < > 37.2* 30.6* 27.7*  MCV  --   --  101.9* 101.7* 100.4*  PLT 157   < > 257 200 170   < > = values in this interval not displayed.   Cardiac Enzymes: No results for input(s): CKTOTAL, CKMB, CKMBINDEX, TROPONINI in the last 8760 hours. BNP: Invalid input(s): POCBNP No results found for: HGBA1C No results found for: TSH Lab Results  Component Value Date   VITAMINB12 1,382 (H) 08/16/2018   No results found for: FOLATE No results found for: IRON, TIBC, FERRITIN  Imaging and Procedures obtained prior to SNF admission: DG Pelvis Portable  Result Date: 10/04/2021 CLINICAL DATA:  Post LEFT hip arthroplasty EXAM: PORTABLE PELVIS 1-2 VIEWS COMPARISON:  Portable exam 1111 hours compared to intraoperative images of 10/04/2021 as well as earlier study of 09/05/2020 FINDINGS: Osseous demineralization. Components of LEFT hip prosthesis identified without fracture or dislocation. SI joints and RIGHT hip joint space preserved. Atherosclerotic calcifications of the RIGHT iliac arteries. IMPRESSION: LEFT hip prosthesis and osseous demineralization. No acute abnormalities. Electronically Signed   By: Lavonia Dana M.D.   On: 10/04/2021 11:32   DG C-Arm 1-60 Min-No Report  Result Date: 10/04/2021 Fluoroscopy was utilized by the requesting physician.  No  radiographic interpretation.   DG HIP UNILAT WITH PELVIS 1V LEFT  Result Date: 10/04/2021 CLINICAL DATA:  Fluoroscopic assistance for left hip arthroplasty EXAM: DG HIP (WITH OR WITHOUT PELVIS) 1V*L* COMPARISON:  None. FINDINGS: Fluoroscopic assistance was provided for left hip arthroplasty. Radiation dose is 1.31 mGy. Fluoroscopic time is 6 seconds. Two images are submitted for review. IMPRESSION: Fluoroscopic assistance was provided for left hip arthroplasty. Electronically Signed   By: Elmer Picker M.D.   On: 10/04/2021 10:16    Assessment/Plan  S/P total left hip arthroplasty WBAT On Eliquis already Pain is not controlled well Will Continue Norco and Robaxin Will start on Tylenol 650 mg TID Follow up with Dr Alvan Dame in 2 weeks Continues to work with Therapy  Anemia, unspecified type Hgb is low but stable Will repeat in 1 week Atrial fibrillation, chronic ([place]) On eliquis and Amiodarone Follows with Cardiology Essential hypertension On Cardizem Tachy-brady syndrome ([place]) S/p Pacemaker Chronic bilateral low back pain with left-sided sciatica On Chronic Prednisone per his previous PCP  Will try to taper it after he is more ambulatory  Gastroesophageal reflux disease, unspecified whether esophagitis present Continue Prilosec Benign prostatic hyperplasia with urinary frequency On Flomax Insomnia secondary to depression with anxiety and Poor appetite Will Increase his Remeron to 15 mg  He was on Trazodone before not sure why was taken off Will follow  Slow transit constipation On Miralax Peripheral edema Low dose of lasix   Family/ staff Communication:   Labs/tests ordered: CBC in 1 week

## 2021-10-18 LAB — CBC: RBC: 2.85 — AB (ref 3.87–5.11)

## 2021-10-18 LAB — CBC AND DIFFERENTIAL
HCT: 28 — AB (ref 41–53)
Hemoglobin: 9 — AB (ref 13.5–17.5)
Neutrophils Absolute: 6858
Platelets: 273 (ref 150–399)
WBC: 10.1

## 2021-10-27 LAB — CBC AND DIFFERENTIAL
HCT: 28 — AB (ref 41–53)
Hemoglobin: 9.1 — AB (ref 13.5–17.5)
Platelets: 227 (ref 150–399)
WBC: 6.8

## 2021-10-27 LAB — CBC: RBC: 2.87 — AB (ref 3.87–5.11)

## 2021-11-01 ENCOUNTER — Encounter: Payer: Self-pay | Admitting: Internal Medicine

## 2021-11-01 ENCOUNTER — Non-Acute Institutional Stay (SKILLED_NURSING_FACILITY): Payer: Medicare Other | Admitting: Internal Medicine

## 2021-11-01 DIAGNOSIS — K219 Gastro-esophageal reflux disease without esophagitis: Secondary | ICD-10-CM

## 2021-11-01 DIAGNOSIS — F418 Other specified anxiety disorders: Secondary | ICD-10-CM

## 2021-11-01 DIAGNOSIS — N401 Enlarged prostate with lower urinary tract symptoms: Secondary | ICD-10-CM

## 2021-11-01 DIAGNOSIS — M5442 Lumbago with sciatica, left side: Secondary | ICD-10-CM

## 2021-11-01 DIAGNOSIS — I482 Chronic atrial fibrillation, unspecified: Secondary | ICD-10-CM

## 2021-11-01 DIAGNOSIS — I495 Sick sinus syndrome: Secondary | ICD-10-CM

## 2021-11-01 DIAGNOSIS — D649 Anemia, unspecified: Secondary | ICD-10-CM

## 2021-11-01 DIAGNOSIS — Z96642 Presence of left artificial hip joint: Secondary | ICD-10-CM | POA: Diagnosis not present

## 2021-11-01 DIAGNOSIS — I10 Essential (primary) hypertension: Secondary | ICD-10-CM

## 2021-11-01 DIAGNOSIS — M7989 Other specified soft tissue disorders: Secondary | ICD-10-CM

## 2021-11-01 DIAGNOSIS — G8929 Other chronic pain: Secondary | ICD-10-CM

## 2021-11-01 DIAGNOSIS — K5901 Slow transit constipation: Secondary | ICD-10-CM

## 2021-11-01 DIAGNOSIS — F5105 Insomnia due to other mental disorder: Secondary | ICD-10-CM

## 2021-11-01 DIAGNOSIS — R350 Frequency of micturition: Secondary | ICD-10-CM

## 2021-11-01 NOTE — Progress Notes (Signed)
Location:   [facility] Room Number: 14 Place of Service:  SNF [phone])  Provider: Veleta Miners MD   PCP: Virgie Dad, MD Patient Care Team: Virgie Dad, MD as PCP - General (Internal Medicine) Lavonna Monarch, MD as Consulting Physician (Dermatology)  Extended Emergency Contact Information Primary Emergency Contact: Michelle Nasuti Address: [address]          [place], [place] [address] Johnnette Litter of [place] Phone: [phone] Work Phone: [phone] Mobile Phone: [phone] Relation: Son Secondary Emergency Contact: Banas,Chris Mobile Phone: [phone] Relation: Son Preferred language: English  Code Status: DNR Managed Care Goals of care:  Advanced Directive information Advanced Directives 11/01/2021  Does Patient Have a Medical Advance Directive? Yes  Type of Paramedic of [place];Living will  Does patient want to make changes to medical advance directive? No - Patient declined  Copy of [place] in Chart? Yes - validated most recent copy scanned in chart (See row information)  Would patient like information on creating a medical advance directive? -  Pre-existing out of facility DNR order (yellow form or pink MOST form) -     No Known Allergies  Chief Complaint  Patient presents with   Discharge Note    Discharge from SNF    HPI:  87 y.o. male  For discharge from the SNF to [facility]   Electively Admitted to [facility] from 11/22-11/26 for Left Hip Total Arthroplasty    Patient has a history of A. fib on Eliquis, hypertension, hyperlipidemia,   right clavicle and right pubic ramus fracture in 10/21 H/o Tachybrady s/p Pacemaker  Also h/o Chronic Pain in Lower Back  ON Chronic Prednisone per previous PCP   Patient had non remitting pain in his Left Hip. He was electively admitted on 11/22 and Underwent Arthroplasty same day He did well post op was uneventful   Came to SNF for  therapy Did well. Walking with his walker and Mild assist Uses Wheelchair for transfers. 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   History of kidney stones    Hypertension    Kidney stone    Presence of permanent cardiac pacemaker 06/03/2020   SCC (squamous cell carcinoma) 07/17/2008   SCC WELL DIFF (RIGHT HELICAL MARGIN)(MOHS)   SCC (squamous cell carcinoma) 03/24/2013   SCC IN SITU (BACK SCALP)(TX AFTER BX)   SCC (squamous cell carcinoma) 03/24/2013   SCC IN SITU (RIGHT SCALP)(TX AFTER BX)   SCC (squamous cell carcinoma) 03/24/2013   SCC IN SITU (MID SCALP)(TX AFTER BX)   SCC (squamous cell carcinoma) 03/24/2013   SCC IN SITU (MID NECK)(TX AFTER BX)   Spinal stenosis    Squamous cell carcinoma of skin 11/25/1997   ant right scalp    Tachy-brady syndrome ([place]) 06/03/2020    Past Surgical History:  Procedure Laterality Date   addnoid     CYSTOSCOPY W/ URETEROSCOPY  1980  INSERT / REPLACE / REMOVE PACEMAKER  06/03/2020   KNEE SURGERY     NAILBED REPAIR Left 06/27/2016   Procedure: NAILBED biopsy left thumb;  Surgeon: Daryll Brod, MD;  Location: [facility];  Service: Orthopedics;  Laterality: Left;  FAB   PACEMAKER IMPLANT N/A 06/03/2020   Procedure: PACEMAKER IMPLANT;  Surgeon: Constance Haw, MD;  Location: [facility];  Service: Cardiovascular;  Laterality: N/A;   TONSILLECTOMY     TOTAL HIP ARTHROPLASTY Left 10/04/2021   Procedure: TOTAL HIP ARTHROPLASTY ANTERIOR APPROACH;  Surgeon: Paralee Cancel,  MD;  Location: [facility];  Service: Orthopedics;  Laterality: Left;      reports that he quit smoking about 65 years ago. His smoking use included cigarettes, pipe, and cigars. He has a 30.00 pack-year smoking history. He has never used smokeless tobacco. He reports current alcohol use. He reports that he does not use drugs. Social History   Socioeconomic History   Marital status: Widowed    Spouse name: Not on file   Number of children: 4   Years of education: Not on file   Highest education level: Not on file  Occupational History   Occupation: retired  Tobacco Use   Smoking status: Former    Packs/day: 1.50    Years: 20.00    Pack years: 30.00    Types: Cigarettes, Pipe, Cigars    Quit date: 03/13/1956    Years since quitting: 65.6   Smokeless tobacco: Never  Vaping Use   Vaping Use: Never used  Substance and Sexual Activity   Alcohol use: Yes    Comment: daily- 2 manhattans   Drug use: Never   Sexual activity: Not on file  Other Topics Concern   Not on file  Social History Narrative   Not on file   Social Determinants of Health   Financial Resource Strain: Not on file  Food Insecurity: Not on file  Transportation Needs: Not on file  Physical Activity: Not on file  Stress: Not on file  Social Connections: Not on file  Intimate Partner Violence: Not on file   Functional Status Survey:    No Known Allergies  Pertinent  Health Maintenance Due  Topic Date Due   INFLUENZA VACCINE  Completed    Medications: Allergies as of 11/01/2021   No Known Allergies      Medication List        Accurate as of November 01, 2021 10:03 AM. If you have any questions, ask your nurse or doctor.          acetaminophen 325 MG tablet Commonly known as: TYLENOL Take 650 mg by mouth 3 (three) times daily.   amiodarone 200 MG tablet Commonly known as: PACERONE Take 200 mg by mouth daily.   apixaban 2.5 MG Tabs tablet Commonly known as: Eliquis Take 1 tablet (2.5 mg  total) by mouth 2 (two) times daily.   atorvastatin 10 MG tablet Commonly known as: LIPITOR Take 10 mg by mouth every evening.   cyanocobalamin 1000 MCG tablet Take 1,000 mcg by mouth every morning.   diltiazem 120 MG 24 hr capsule Commonly known as: CARDIZEM CD Take 120 mg by mouth daily.   docusate sodium 100 MG capsule Commonly known as: COLACE Take 1 capsule (100 mg total) by mouth 2 (two) times daily.   furosemide 20 MG tablet Commonly known as: LASIX Take 20 mg by mouth daily.   HYDROcodone-acetaminophen 5-325 MG tablet Commonly known as: NORCO/VICODIN Take 1-2  tablets by mouth every 6 (six) hours as needed for severe pain.   magnesium hydroxide 400 MG/5ML suspension Commonly known as: MILK OF MAGNESIA Take 30 mLs by mouth daily as needed for mild constipation.   melatonin 5 MG Tabs Take 5 mg by mouth at bedtime.   methocarbamol 500 MG tablet Commonly known as: ROBAXIN Take 1 tablet (500 mg total) by mouth every 8 (eight) hours as needed for muscle spasms.   mirtazapine 15 MG tablet Commonly known as: REMERON Take 15 mg by mouth at bedtime.   omeprazole 20 MG capsule Commonly known as: PRILOSEC Take 20 mg by mouth daily with breakfast.   polyethylene glycol 17 g packet Commonly known as: MIRALAX / GLYCOLAX Take 17 g by mouth daily as needed.   predniSONE 5 MG Tbec Take 5 mg by mouth daily.   PRESERVISION AREDS 2+MULTI VIT PO Take 1 capsule by mouth in the morning and at bedtime.   senna-docusate 8.6-50 MG tablet Commonly known as: Senokot-S Take 1 tablet by mouth daily.   tamsulosin 0.4 MG Caps capsule Commonly known as: FLOMAX Take 0.4 mg by mouth every morning.   timolol 0.5 % ophthalmic solution Commonly known as: TIMOPTIC Place 1 drop into both eyes daily.   zinc oxide 20 % ointment Apply 1 application topically as needed for irritation.        Review of Systems  Vitals:   11/01/21 0923  BP: 124/68  Pulse: 66  Resp: 18  Temp:  98 F (36.7 C)  SpO2: 96%  Weight: 166 lb 8 oz (75.5 kg)  Height: 5\' 6"  (1.676 m)   Body mass index is 26.87 kg/m. Physical Exam Vitals reviewed.  Constitutional:      Appearance: Normal appearance.  HENT:     Head: Normocephalic.     Mouth/Throat:     Mouth: Mucous membranes are moist.     Pharynx: Oropharynx is clear.  Eyes:     Pupils: Pupils are equal, round, and reactive to light.  Cardiovascular:     Rate and Rhythm: Normal rate and regular rhythm.     Pulses: Normal pulses.     Heart sounds: No murmur heard. Pulmonary:     Effort: Pulmonary effort is normal. No respiratory distress.     Breath sounds: Normal breath sounds. No rales.  Abdominal:     General: Abdomen is flat. Bowel sounds are normal.     Palpations: Abdomen is soft.  Musculoskeletal:        General: Swelling present.     Cervical back: Neck supple.     Comments: Left Leg is significantly Edema more then Right  Skin:    General: Skin is warm.  Neurological:     General: No focal deficit present.     Mental Status: He is alert and oriented to person, place, and time.  Psychiatric:        Mood and Affect: Mood normal.        Thought Content: Thought content normal.    Labs reviewed: Basic Metabolic Panel: Recent Labs    09/28/21 1133 10/05/21 0325  NA 137 134*  K 4.3 4.6  CL 104 104  CO2 25 24  GLUCOSE 171* 141*  BUN 21 23  CREATININE 1.27* 1.10  CALCIUM 8.9 8.8*   Liver Function Tests: Recent Labs    09/28/21 1133  AST 26  ALT 25  ALKPHOS 74  BILITOT 0.7  PROT 6.2*  ALBUMIN 3.4*   No results for  input(s): LIPASE, AMYLASE in the last 8760 hours. No results for input(s): AMMONIA in the last 8760 hours. CBC: Recent Labs    09/28/21 1133 10/05/21 0325 10/06/21 0334 10/18/21 0000 10/27/21 0000  WBC 12.6* 18.0* 17.8* 10.1 6.8  NEUTROABS  --   --   --  6,858.00  --   HGB 11.7* 9.8* 9.0* 9.0* 9.1*  HCT 37.2* 30.6* 27.7* 28* 28*  MCV 101.9* 101.7* 100.4*  --   --   PLT 257  200 170 273 227   Cardiac Enzymes: No results for input(s): CKTOTAL, CKMB, CKMBINDEX, TROPONINI in the last 8760 hours. BNP: Invalid input(s): POCBNP CBG: No results for input(s): GLUCAP in the last 8760 hours.  Procedures and Imaging Studies During Stay: DG Pelvis Portable  Result Date: 10/04/2021 CLINICAL DATA:  Post LEFT hip arthroplasty EXAM: PORTABLE PELVIS 1-2 VIEWS COMPARISON:  Portable exam 1111 hours compared to intraoperative images of 10/04/2021 as well as earlier study of 09/05/2020 FINDINGS: Osseous demineralization. Components of LEFT hip prosthesis identified without fracture or dislocation. SI joints and RIGHT hip joint space preserved. Atherosclerotic calcifications of the RIGHT iliac arteries. IMPRESSION: LEFT hip prosthesis and osseous demineralization. No acute abnormalities. Electronically Signed   By: Lavonia Dana M.D.   On: 10/04/2021 11:32   DG C-Arm 1-60 Min-No Report  Result Date: 10/04/2021 Fluoroscopy was utilized by the requesting physician.  No radiographic interpretation.   DG HIP UNILAT WITH PELVIS 1V LEFT  Result Date: 10/04/2021 CLINICAL DATA:  Fluoroscopic assistance for left hip arthroplasty EXAM: DG HIP (WITH OR WITHOUT PELVIS) 1V*L* COMPARISON:  None. FINDINGS: Fluoroscopic assistance was provided for left hip arthroplasty. Radiation dose is 1.31 mGy. Fluoroscopic time is 6 seconds. Two images are submitted for review. IMPRESSION: Fluoroscopic assistance was provided for left hip arthroplasty. Electronically Signed   By: Elmer Picker M.D.   On: 10/04/2021 10:16    Assessment/Plan:   1. S/P total left hip arthroplasty Pain Controlled on Norco Walking with his walker and mild assist Independent in his transfers Doing his ADLS Transfer to AL  2. Leg swelling Doppler Ordered Already in Lasix  3. Atrial fibrillation, chronic (HCC) On Eliquis and Cardizem and Amiodarone  4. Essential hypertension Controlled on Cardizem  5. Tachy-brady  syndrome ([place])  S/p Pacemaker 6. Chronic bilateral low back pain with left-sided sciatica On Chronic Prednisone by Previous PCP Will try to taper when more mobile  7. Gastroesophageal reflux disease, unspecified whether esophagitis present On Prilosec  8. Benign prostatic hyperplasia with urinary frequency On Flomax  9. Insomnia secondary to depression with anxiety Continue Remeron  10. Slow transit constipation Change Miralax to QOD  11. Anemia, unspecified type Hgb stable   Future labs/tests needed:  CBC

## 2021-11-09 ENCOUNTER — Non-Acute Institutional Stay: Payer: Medicare Other | Admitting: Orthopedic Surgery

## 2021-11-09 ENCOUNTER — Encounter: Payer: Self-pay | Admitting: Orthopedic Surgery

## 2021-11-09 DIAGNOSIS — Z96642 Presence of left artificial hip joint: Secondary | ICD-10-CM

## 2021-11-09 DIAGNOSIS — H6121 Impacted cerumen, right ear: Secondary | ICD-10-CM

## 2021-11-09 DIAGNOSIS — G8929 Other chronic pain: Secondary | ICD-10-CM

## 2021-11-09 DIAGNOSIS — I482 Chronic atrial fibrillation, unspecified: Secondary | ICD-10-CM

## 2021-11-09 DIAGNOSIS — K219 Gastro-esophageal reflux disease without esophagitis: Secondary | ICD-10-CM

## 2021-11-09 DIAGNOSIS — I495 Sick sinus syndrome: Secondary | ICD-10-CM | POA: Diagnosis not present

## 2021-11-09 DIAGNOSIS — D649 Anemia, unspecified: Secondary | ICD-10-CM

## 2021-11-09 DIAGNOSIS — M7989 Other specified soft tissue disorders: Secondary | ICD-10-CM

## 2021-11-09 DIAGNOSIS — N401 Enlarged prostate with lower urinary tract symptoms: Secondary | ICD-10-CM

## 2021-11-09 DIAGNOSIS — F418 Other specified anxiety disorders: Secondary | ICD-10-CM

## 2021-11-09 DIAGNOSIS — K5901 Slow transit constipation: Secondary | ICD-10-CM

## 2021-11-09 DIAGNOSIS — R350 Frequency of micturition: Secondary | ICD-10-CM

## 2021-11-09 DIAGNOSIS — M5442 Lumbago with sciatica, left side: Secondary | ICD-10-CM

## 2021-11-09 DIAGNOSIS — F5105 Insomnia due to other mental disorder: Secondary | ICD-10-CM

## 2021-11-09 MED ORDER — HYDROCODONE-ACETAMINOPHEN 5-325 MG PO TABS: 1.0000 | ORAL_TABLET | Freq: Two times a day (BID) | ORAL | 0 refills | Status: AC | PRN

## 2021-11-09 NOTE — Progress Notes (Signed)
Location:   [place] Room Number: [place] of Service:  ALF [phone]) Provider:  Windell Moulding, NP  Virgie Dad, MD  Patient Care Team: Virgie Dad, MD as PCP - General (Internal Medicine) Lavonna Monarch, MD as Consulting Physician (Dermatology)  Extended Emergency Contact Information Primary Emergency Contact: Michelle Nasuti Address: [address]          [place], [place] [address] Johnnette Litter of [place] Phone: [phone] Work Phone: [phone] Mobile Phone: [phone] Relation: Son Secondary Emergency Contact: Mcnairy,Chris Mobile Phone: [phone] Relation: Son Preferred language: English  Code Status:  DNR Goals of care: Advanced Directive information Advanced Directives 11/09/2021  Does Patient Have a Medical Advance Directive? Yes  Type of Paramedic of [place];Living will  Does patient want to make changes to medical advance directive? No - Patient declined  Copy of [place] in Chart? Yes - validated most recent copy scanned in chart (See row information)  Would patient like information on creating a medical advance directive? -  Pre-existing out of facility DNR order (yellow form or pink MOST form) -     Chief Complaint  Patient presents with   Acute Visit    Lower abdominal pain.    HPI:  Pt is a 87 y.o. male seen today for medical management of chronic diseases.    He currently resides on the assisted living unit at [facility]. PMH: atrial fibrillation, HTN, AV block, tachy-brady syndrome, GERD, constipation, CKD III, anemia, and depression.   Total left hip arthroplasty- hospitalized 11/22-11/26, WBAT, pain controlled with norco, doing well with PT, remains on Eliquis for DVT prophylaxis Leg swelling- doppler negative, remains on lasix and ted hose daily Atrial fibrillation- HR controlled with amiodarone and Cardizem, remains on Eliquis for dvt prophylaxis Tachy-  brady syndrome- s/p pacemaker  Chronic back pain- denies increased pain today, remains on low dose prednisone and scheduled tylenol GERD- denies increased reflux, remains on omeprazole Insomnia/depression- no mood changes, sleeping well, remains on Remeron Constipation- reports daily bowel movements, reports hard stools today, remains on senna and miralax BPH- denies increased retention, remains on Flomax Anemia- Hgb 9.1 10/27/2021  No recent falls or injuries. Ambulates using wheelchair/ walker with assist.   Recent blood pressures:  12/27- 120/62, 134/72, 114/61, 102/50  12/26- 113/62, 126/66, 144/72  Recent weights:  12/21- 166.5 lbs  10/05- 156.4 lbs  09/11- 156.6 lbs     Past Medical History:  Diagnosis Date   Ankle edema    Anxiety    Arthritis    Basal cell carcinoma 11/21/2004   MID FOREHEAD   BCC (basal cell carcinoma of skin) 08/26/2007   (LEFT OUTER, ZYGOMATIC ARCH)(CURET3, EXC)   Dyspnea    Dysrhythmia    a-fib/flutter- on eliquis   GERD (gastroesophageal reflux disease)    no treatment   Hard of hearing    Heart block AV complete ([place]) 07/31/2020   History of kidney stones    Hypertension    Kidney stone    Presence of permanent cardiac pacemaker 06/03/2020   SCC (squamous cell carcinoma) 07/17/2008   SCC WELL DIFF (RIGHT HELICAL MARGIN)(MOHS)   SCC (squamous cell carcinoma) 03/24/2013   SCC IN SITU (BACK SCALP)(TX AFTER BX)   SCC (squamous cell carcinoma) 03/24/2013   SCC IN SITU (RIGHT SCALP)(TX AFTER BX)   SCC (squamous cell carcinoma) 03/24/2013   SCC IN SITU (MID SCALP)(TX AFTER BX)   SCC (squamous cell carcinoma) 03/24/2013  SCC IN SITU (MID NECK)(TX AFTER BX)   Spinal stenosis    Squamous cell carcinoma of skin 11/25/1997   ant right scalp    Tachy-brady syndrome ([place]) 06/03/2020   Past Surgical History:  Procedure Laterality Date   addnoid     CYSTOSCOPY W/ URETEROSCOPY  1980   INSERT / REPLACE / REMOVE PACEMAKER  06/03/2020   KNEE  SURGERY     NAILBED REPAIR Left 06/27/2016   Procedure: NAILBED biopsy left thumb;  Surgeon: Daryll Brod, MD;  Location: [facility];  Service: Orthopedics;  Laterality: Left;  FAB   PACEMAKER IMPLANT N/A 06/03/2020   Procedure: PACEMAKER IMPLANT;  Surgeon: Constance Haw, MD;  Location: [facility];  Service: Cardiovascular;  Laterality: N/A;   TONSILLECTOMY     TOTAL HIP ARTHROPLASTY Left 10/04/2021   Procedure: TOTAL HIP ARTHROPLASTY ANTERIOR APPROACH;  Surgeon: Paralee Cancel, MD;  Location[facility];  Service: Orthopedics;  Laterality: Left;    No Known Allergies  Allergies as of 11/09/2021   No Known Allergies      Medication List        Accurate as of November 09, 2021 10:19 AM. If you have any questions, ask your nurse or doctor.          acetaminophen 325 MG tablet Commonly known as: TYLENOL Take 650 mg by mouth 3 (three) times daily.   amiodarone 200 MG tablet Commonly known as: PACERONE Take 200 mg by mouth daily.   apixaban 2.5 MG Tabs tablet Commonly known as: Eliquis Take 1 tablet (2.5 mg total) by mouth 2 (two) times daily.   atorvastatin 10 MG tablet Commonly known as: LIPITOR Take 10 mg by mouth every evening.   cyanocobalamin 1000 MCG tablet Take 1,000 mcg by mouth every morning.   diltiazem 120 MG 24 hr capsule Commonly known as: CARDIZEM CD Take 120 mg by mouth daily.   docusate sodium 100 MG capsule Commonly known as: COLACE Take 1 capsule (100 mg total) by mouth 2 (two) times daily.   furosemide 20 MG tablet Commonly known as: LASIX Take 20 mg by mouth daily.   HYDROcodone-acetaminophen 5-325 MG tablet Commonly known as: NORCO/VICODIN Take 1-2 tablets by mouth every 6 (six) hours as needed for severe pain.   magnesium hydroxide 400 MG/5ML suspension Commonly known as: MILK OF MAGNESIA Take 30 mLs by mouth daily as needed for mild constipation.   melatonin 5 MG Tabs Take 5 mg by mouth at bedtime.    methocarbamol 500 MG tablet Commonly known as: ROBAXIN Take 1 tablet (500 mg total) by mouth every 8 (eight) hours as needed for muscle spasms.   mirtazapine 15 MG tablet Commonly known as: REMERON Take 15 mg by mouth at bedtime.   omeprazole 20 MG capsule Commonly known as: PRILOSEC Take 20 mg by mouth daily with breakfast.   polyethylene glycol 17 g packet Commonly known as: MIRALAX / GLYCOLAX Take 17 g by mouth daily as needed.   predniSONE 5 MG Tbec Take 5 mg by mouth daily.   PRESERVISION AREDS 2+MULTI VIT PO Take 1 capsule by mouth in the morning and at bedtime.   senna-docusate 8.6-50 MG tablet Commonly known as: Senokot-S Take 1 tablet by mouth daily.   tamsulosin 0.4 MG Caps capsule Commonly known as: FLOMAX Take 0.4 mg by mouth every morning.   timolol 0.5 % ophthalmic solution Commonly known as: TIMOPTIC Place 1 drop into both eyes daily.   zinc oxide 20 %  ointment Apply 1 application topically as needed for irritation.        Review of Systems  Constitutional:  Negative for activity change, appetite change, chills, fatigue and fever.  HENT:  Positive for hearing loss. Negative for congestion and trouble swallowing.   Eyes:  Negative for photophobia and visual disturbance.  Respiratory:  Negative for cough, shortness of breath and wheezing.   Cardiovascular:  Positive for leg swelling. Negative for chest pain.  Gastrointestinal:  Positive for constipation. Negative for abdominal distention, abdominal pain, blood in stool, diarrhea, nausea and vomiting.  Genitourinary:  Positive for frequency. Negative for dysuria and hematuria.  Musculoskeletal:  Positive for arthralgias and gait problem.  Skin:  Positive for wound.  Neurological:  Positive for weakness. Negative for dizziness and headaches.  Psychiatric/Behavioral:  Positive for dysphoric mood. Negative for sleep disturbance. The patient is not nervous/anxious.    Immunization History  Administered  Date(s) Administered   Influenza, High Dose Seasonal PF 08/07/2016   Influenza-Unspecified 08/04/2020, 09/06/2021   Moderna SARS-COV2 Booster Vaccination 09/09/2020   Moderna Sars-Covid-2 Vaccination 11/18/2019, 12/15/2019   Pneumococcal-Unspecified 09/13/2020   Pertinent  Health Maintenance Due  Topic Date Due   INFLUENZA VACCINE  Completed   Fall Risk 10/06/2021 10/06/2021 10/07/2021 10/07/2021 10/08/2021  Patient Fall Risk Level High fall risk High fall risk High fall risk High fall risk High fall risk   Functional Status Survey:    Vitals:   11/09/21 0921  BP: 120/62  Pulse: 68  Resp: 18  Temp: (!) 97.5 F (36.4 C)  SpO2: 95%  Weight: 166 lb 8 oz (75.5 kg)  Height: 5\' 6"  (1.676 m)   Body mass index is 26.87 kg/m. Physical Exam Vitals reviewed.  Constitutional:      General: He is not in acute distress. HENT:     Head: Normocephalic.     Right Ear: There is impacted cerumen.     Left Ear: There is no impacted cerumen.     Ears:     Comments: Left hearing aid present     Nose: Nose normal.     Mouth/Throat:     Mouth: Mucous membranes are moist.  Eyes:     General:        Right eye: No discharge.        Left eye: No discharge.  Neck:     Vascular: No carotid bruit.  Cardiovascular:     Rate and Rhythm: Normal rate. Rhythm irregular.     Pulses: Normal pulses.     Heart sounds: Normal heart sounds. No murmur heard. Pulmonary:     Effort: Pulmonary effort is normal. No respiratory distress.     Breath sounds: Normal breath sounds. No wheezing.  Abdominal:     General: Bowel sounds are normal. There is no distension.     Palpations: Abdomen is soft.     Tenderness: There is no abdominal tenderness.  Musculoskeletal:     Cervical back: Normal range of motion.     Right lower leg: Edema present.     Left lower leg: Edema present.     Comments: Non-pitting  Lymphadenopathy:     Cervical: No cervical adenopathy.  Skin:    General: Skin is warm and dry.      Capillary Refill: Capillary refill takes less than 2 seconds.     Comments: Left hip surgical incision closed, CDI, no drainage, surrounding skin intact  Neurological:     General: No focal deficit present.  Mental Status: He is alert and oriented to person, place, and time.     Motor: Weakness present.     Gait: Gait abnormal.     Comments: Wheelchair/walker  Psychiatric:        Mood and Affect: Mood normal.        Behavior: Behavior normal.    Labs reviewed: Recent Labs    09/28/21 1133 10/05/21 0325  NA 137 134*  K 4.3 4.6  CL 104 104  CO2 25 24  GLUCOSE 171* 141*  BUN 21 23  CREATININE 1.27* 1.10  CALCIUM 8.9 8.8*   Recent Labs    09/28/21 1133  AST 26  ALT 25  ALKPHOS 74  BILITOT 0.7  PROT 6.2*  ALBUMIN 3.4*   Recent Labs    09/28/21 1133 10/05/21 0325 10/06/21 0334 10/18/21 0000 10/27/21 0000  WBC 12.6* 18.0* 17.8* 10.1 6.8  NEUTROABS  --   --   --  6,858.00  --   HGB 11.7* 9.8* 9.0* 9.0* 9.1*  HCT 37.2* 30.6* 27.7* 28* 28*  MCV 101.9* 101.7* 100.4*  --   --   PLT 257 200 170 273 227   No results found for: TSH No results found for: HGBA1C Lab Results  Component Value Date   CHOL 137 09/27/2020   HDL 44 09/27/2020   LDLCALC 75 09/27/2020   TRIG 92 09/27/2020    Significant Diagnostic Results in last 30 days:  No results found.  Assessment/Plan 1. S/P total left hip arthroplasty - doing well with PT, pain minimal, no sign of infection - remains WBAT - will wean hydrocodone to bid prn x 14 days - cont scheduled tylenol - cont Eliquis  2. Leg swelling - doppler negative - cont lasix - cont ted hose  3. Atrial fibrillation, chronic (HCC) - HR controlled with amiodarone and Cardizem - cont Eliquis for clot prevention  4. Tachy-brady syndrome ([place]) - s/p pacemaker  5. Chronic bilateral low back pain with left-sided sciatica - cont tylenol and low dose prednisone - consider tapering prednisone once recovered from [place]  6.  Gastroesophageal reflux disease, unspecified whether esophagitis present - cont omeprazole  7. Insomnia secondary to depression with anxiety - cont Remeron  8. Slow transit constipation - reports hard stools - will increase senna to 2 tablets qhs - cont miralax  9. Benign prostatic hyperplasia with urinary frequency - cont Flomax  10. Anemia, unspecified type - hgb 9.1 10/27/2021 - cbc/diff- 11/21/2020  11. Impacted cerumen of right ear - cannot visualize TM due to wax - debrox 5 gtts- apply to right ear qhs x 5 days    Family/ staff Communication: plan discussed with patient and nurse  Labs/tests ordered:  cbc/diff

## 2021-11-09 NOTE — Progress Notes (Deleted)
Location:   [place] Room Number: [place] of Service:  ALF [phone]) Provider:  Windell Moulding, NP  Virgie Dad, MD  Patient Care Team: Virgie Dad, MD as PCP - General (Internal Medicine) Lavonna Monarch, MD as Consulting Physician (Dermatology)  Extended Emergency Contact Information Primary Emergency Contact: Michelle Nasuti Address: [address]          [place], [place] [address] Johnnette Litter of [place] Phone: [phone] Work Phone: [phone] Mobile Phone: [phone] Relation: Son Secondary Emergency Contact: Monestime,Chris Mobile Phone: [phone] Relation: Son Preferred language: English  Code Status:  DNR Goals of care: Advanced Directive information Advanced Directives 11/09/2021  Does Patient Have a Medical Advance Directive? Yes  Type of Paramedic of [place];Living will  Does patient want to make changes to medical advance directive? No - Patient declined  Copy of [place] in Chart? Yes - validated most recent copy scanned in chart (See row information)  Would patient like information on creating a medical advance directive? -  Pre-existing out of facility DNR order (yellow form or pink MOST form) -     Chief Complaint  Patient presents with   Acute Visit    Lower abdominal pain.    HPI:  Pt is a 87 y.o. male seen today for an acute visit for    Past Medical History:  Diagnosis Date   Ankle edema    Anxiety    Arthritis    Basal cell carcinoma 11/21/2004   MID FOREHEAD   BCC (basal cell carcinoma of skin) 08/26/2007   (LEFT OUTER, ZYGOMATIC ARCH)(CURET3, EXC)   Dyspnea    Dysrhythmia    a-fib/flutter- on eliquis   GERD (gastroesophageal reflux disease)    no treatment   Hard of hearing    Heart block AV complete ([place]) 07/31/2020   History of kidney stones    Hypertension    Kidney stone    Presence of permanent cardiac pacemaker 06/03/2020   SCC (squamous  cell carcinoma) 07/17/2008   SCC WELL DIFF (RIGHT HELICAL MARGIN)(MOHS)   SCC (squamous cell carcinoma) 03/24/2013   SCC IN SITU (BACK SCALP)(TX AFTER BX)   SCC (squamous cell carcinoma) 03/24/2013   SCC IN SITU (RIGHT SCALP)(TX AFTER BX)   SCC (squamous cell carcinoma) 03/24/2013   SCC IN SITU (MID SCALP)(TX AFTER BX)   SCC (squamous cell carcinoma) 03/24/2013   SCC IN SITU (MID NECK)(TX AFTER BX)   Spinal stenosis    Squamous cell carcinoma of skin 11/25/1997   ant right scalp    Tachy-brady syndrome ([place]) 06/03/2020   Past Surgical History:  Procedure Laterality Date   addnoid     CYSTOSCOPY W/ URETEROSCOPY  1980   INSERT / REPLACE / REMOVE PACEMAKER  06/03/2020   KNEE SURGERY     NAILBED REPAIR Left 06/27/2016   Procedure: NAILBED biopsy left thumb;  Surgeon: Daryll Brod, MD;  Location: [facility];  Service: Orthopedics;  Laterality: Left;  FAB   PACEMAKER IMPLANT N/A 06/03/2020   Procedure: PACEMAKER IMPLANT;  Surgeon: Constance Haw, MD;  Location: [facility];  Service: Cardiovascular;  Laterality: N/A;   TONSILLECTOMY     TOTAL HIP ARTHROPLASTY Left 10/04/2021   Procedure: TOTAL HIP ARTHROPLASTY ANTERIOR APPROACH;  Surgeon: Paralee Cancel, MD;  Location: [facility];  Service: Orthopedics;  Laterality: Left;    No Known Allergies  Allergies as of 11/09/2021   No Known Allergies  Medication List        Accurate as of November 09, 2021  9:32 AM. If you have any questions, ask your nurse or doctor.          acetaminophen 325 MG tablet Commonly known as: TYLENOL Take 650 mg by mouth 3 (three) times daily.   amiodarone 200 MG tablet Commonly known as: PACERONE Take 200 mg by mouth daily.   apixaban 2.5 MG Tabs tablet Commonly known as: Eliquis Take 1 tablet (2.5 mg total) by mouth 2 (two) times daily.   atorvastatin 10 MG tablet Commonly known as: LIPITOR Take 10 mg by mouth every evening.   cyanocobalamin 1000 MCG tablet Take  1,000 mcg by mouth every morning.   diltiazem 120 MG 24 hr capsule Commonly known as: CARDIZEM CD Take 120 mg by mouth daily.   docusate sodium 100 MG capsule Commonly known as: COLACE Take 1 capsule (100 mg total) by mouth 2 (two) times daily.   furosemide 20 MG tablet Commonly known as: LASIX Take 20 mg by mouth daily.   HYDROcodone-acetaminophen 5-325 MG tablet Commonly known as: NORCO/VICODIN Take 1-2 tablets by mouth every 6 (six) hours as needed for severe pain.   magnesium hydroxide 400 MG/5ML suspension Commonly known as: MILK OF MAGNESIA Take 30 mLs by mouth daily as needed for mild constipation.   melatonin 5 MG Tabs Take 5 mg by mouth at bedtime.   methocarbamol 500 MG tablet Commonly known as: ROBAXIN Take 1 tablet (500 mg total) by mouth every 8 (eight) hours as needed for muscle spasms.   mirtazapine 15 MG tablet Commonly known as: REMERON Take 15 mg by mouth at bedtime.   omeprazole 20 MG capsule Commonly known as: PRILOSEC Take 20 mg by mouth daily with breakfast.   polyethylene glycol 17 g packet Commonly known as: MIRALAX / GLYCOLAX Take 17 g by mouth daily as needed.   predniSONE 5 MG Tbec Take 5 mg by mouth daily.   PRESERVISION AREDS 2+MULTI VIT PO Take 1 capsule by mouth in the morning and at bedtime.   senna-docusate 8.6-50 MG tablet Commonly known as: Senokot-S Take 1 tablet by mouth daily.   tamsulosin 0.4 MG Caps capsule Commonly known as: FLOMAX Take 0.4 mg by mouth every morning.   timolol 0.5 % ophthalmic solution Commonly known as: TIMOPTIC Place 1 drop into both eyes daily.   zinc oxide 20 % ointment Apply 1 application topically as needed for irritation.        Review of Systems  Immunization History  Administered Date(s) Administered   Influenza, High Dose Seasonal PF 08/07/2016   Influenza-Unspecified 08/04/2020, 09/06/2021   Moderna SARS-COV2 Booster Vaccination 09/09/2020   Moderna Sars-Covid-2 Vaccination  11/18/2019, 12/15/2019   Pneumococcal-Unspecified 09/13/2020   Pertinent  Health Maintenance Due  Topic Date Due   INFLUENZA VACCINE  Completed   Fall Risk 10/06/2021 10/06/2021 10/07/2021 10/07/2021 10/08/2021  Patient Fall Risk Level High fall risk High fall risk High fall risk High fall risk High fall risk   Functional Status Survey:    Vitals:   11/09/21 0921  BP: 120/62  Pulse: 68  Resp: 18  Temp: (!) 97.5 F (36.4 C)  SpO2: 95%  Weight: 166 lb 8 oz (75.5 kg)  Height: 5\' 6"  (1.676 m)   Body mass index is 26.87 kg/m. Physical Exam  Labs reviewed: Recent Labs    09/28/21 1133 10/05/21 0325  NA 137 134*  K 4.3 4.6  CL 104 104  CO2  25 24  GLUCOSE 171* 141*  BUN 21 23  CREATININE 1.27* 1.10  CALCIUM 8.9 8.8*   Recent Labs    09/28/21 1133  AST 26  ALT 25  ALKPHOS 74  BILITOT 0.7  PROT 6.2*  ALBUMIN 3.4*   Recent Labs    09/28/21 1133 10/05/21 0325 10/06/21 0334 10/18/21 0000 10/27/21 0000  WBC 12.6* 18.0* 17.8* 10.1 6.8  NEUTROABS  --   --   --  6,858.00  --   HGB 11.7* 9.8* 9.0* 9.0* 9.1*  HCT 37.2* 30.6* 27.7* 28* 28*  MCV 101.9* 101.7* 100.4*  --   --   PLT 257 200 170 273 227   No results found for: TSH No results found for: HGBA1C Lab Results  Component Value Date   CHOL 137 09/27/2020   HDL 44 09/27/2020   LDLCALC 75 09/27/2020   TRIG 92 09/27/2020    Significant Diagnostic Results in last 30 days:  No results found.  Assessment/Plan There are no diagnoses linked to this encounter.   Family/ staff Communication:   Labs/tests ordered:

## 2021-11-10 ENCOUNTER — Ambulatory Visit: Payer: Medicare Other | Admitting: Student

## 2021-11-10 ENCOUNTER — Encounter: Payer: Self-pay | Admitting: Student

## 2021-11-10 ENCOUNTER — Other Ambulatory Visit: Payer: Self-pay

## 2021-11-10 VITALS — BP 115/55 | HR 62 | Temp 98.0°F | Ht 66.0 in | Wt 169.0 lb

## 2021-11-10 DIAGNOSIS — I495 Sick sinus syndrome: Secondary | ICD-10-CM

## 2021-11-10 DIAGNOSIS — I10 Essential (primary) hypertension: Secondary | ICD-10-CM

## 2021-11-10 DIAGNOSIS — I480 Paroxysmal atrial fibrillation: Secondary | ICD-10-CM

## 2021-11-10 DIAGNOSIS — R0609 Other forms of dyspnea: Secondary | ICD-10-CM

## 2021-11-10 NOTE — Progress Notes (Signed)
Primary Physician/Referring:  Virgie Dad, MD  Patient ID: Stephen Cuevas, male    DOB: [birth year], 87 y.o.   MRN: [id number]   MRN: [id number]  Chief Complaint  Patient presents with   Hypertension   Hyperlipidemia   HPI:    Stephen Cuevas  is a 1925/12/21, 87 y.o.   MRN: [id number] Caucasian male who resides at [facility] assisted living facility and has history of hyperlipidemia, hypertension, sick sinus syndrome with paroxysmal atrial fibrillation, complete heart block who underwent permanent pacemaker implantation on 06/04/2020 for marked dyspnea, fatigue and dizziness.   Patient presents for 29-monthfollow-up.  He underwent left hip arthroplasty 10/04/2021, postop period was uneventful.  He now presents for follow-up of hypertension, hyperlipidemia, and sick sinus syndrome.  Patient's hip pain has significantly improved since surgery.  He is currently working with physical therapy and Occupational Therapy on a daily basis.  Patient does continue to have dyspnea on exertion.  Denies chest pain, palpitations, syncope, near syncope.  Patient is accompanied by his son at today's office visit.  Past Medical History:  Diagnosis Date   Ankle edema    Anxiety    Arthritis    Basal cell carcinoma 11/21/2004   MID FOREHEAD   BCC (basal cell carcinoma of skin) 08/26/2007   (LEFT OUTER, ZYGOMATIC ARCH)(CURET3, EXC)   Dyspnea    Dysrhythmia    a-fib/flutter- on eliquis   GERD (gastroesophageal reflux disease)    no treatment   Hard of hearing    [place] 07/31/[place]   History of kidney stones    Hypertension    Kidney stone    Presence of permanent cardiac pacemaker 06/03/2020   SCC (squamous cell carcinoma) 07/17/2008   SCC WELL DIFF (RIGHT HELICAL MARGIN)(MOHS)   SCC (squamous cell carcinoma) 03/24/2013   SCC IN SITU (BACK SCALP)(TX AFTER BX)   SCC (squamous cell carcinoma) 03/24/2013   SCC IN SITU (RIGHT SCALP)(TX AFTER BX)   SCC (squamous cell carcinoma) 03/24/2013   SCC IN SITU  (MID SCALP)(TX AFTER BX)   SCC (squamous cell carcinoma) 03/24/2013   SCC IN SITU (MID NECK)(TX AFTER BX)   Spinal stenosis    Squamous cell carcinoma of skin 11/25/1997   ant right scalp    T[place]brady [place] 06/03/2020   Past Surgical History:  Procedure Laterality Date   addnoid     CYSTOSCOPY W/ URETEROSCOPY  1980   INSERT / REPLACE / REMOVE PACEMAKER  06/03/2020   KNEE SURGERY     NAILBED REPAIR Left 06/27/2016   Procedure: NAILBED biopsy left thumb;  Surgeon: GDaryll Brod MD;  [facility]  Service: Orthopedics;  Laterality: Left;  FAB   PACEMAKER IMPLANT N/A 06/03/2020   Procedure: PACEMAKER IMPLANT;  Surgeon: CConstance Haw MD;  [facility];  Service: Cardiovascular;  Laterality: N/A;   TONSILLECTOMY     TOTAL HIP ARTHROPLASTY Left 10/04/2021   Procedure: TOTAL HIP ARTHROPLASTY ANTERIOR APPROACH;  Surgeon: OParalee Cancel MD;  [facility];  Service: Orthopedics;  Laterality: Left;   Family History  Problem Relation Age of Onset   Heart failure Father    Heart disease Father     Social History   Tobacco Use   Smoking status: Former    Packs/day: 1.50    Years: 20.00    Pack years: 30.00    Types: Cigarettes, Pipe, Cigars    Quit date: 03/13/1956    Years since quitting: 65.7   Smokeless tobacco: Never  Substance Use Topics   Alcohol use: Yes    Comment: daily- 2 manhattans   Marital Status: Widowed ROS  Review of Systems  Cardiovascular:  Positive for leg swelling (left > right, but improved). Negative for chest pain, dyspnea on exertion, near-syncope, orthopnea, palpitations and syncope.  Musculoskeletal:  Positive for arthritis, back pain and joint pain (bilateral hip ).  Objective  Blood pressure (!) 115/55, pulse 62, temperature 98 F (36.7 C), temperature source Temporal, height '5\' 6"'  (1.676 m), weight 169 lb (76.7 kg), SpO2 97 %.  Vitals with BMI 11/10/2021 11/10/2021 11/09/2021  Height - '5\' 6"'  5'  6"  Weight - 169 lbs 166 lbs 8 oz  BMI - 96.28 36.62  Systolic 947 96 654  Diastolic 55 56 62  Pulse 62 71 68     Physical Exam Vitals reviewed.  Constitutional:      Comments: Wheelchair bound   Cardiovascular:     Rate and Rhythm: Normal rate and regular rhythm.     Pulses: Intact distal pulses.          Carotid pulses are 2+ on the right side and 2+ on the left side.      Popliteal pulses are 2+ on the right side and 2+ on the left side.       Dorsalis pedis pulses are 2+ on the right side and 2+ on the left side.       Posterior tibial pulses are 1+ on the right side and 1+ on the left side.     Heart sounds: S1 normal and S2 normal. Murmur heard.  Early systolic murmur is present with a grade of 2/6 at the upper right sternal border.    No gallop.     Comments: No JVD.   Pulmonary:     Effort: Pulmonary effort is normal.     Breath sounds: Wheezing (bilaterally throughout) present.  Musculoskeletal:     Right lower leg: Edema (minimal) present.     Left lower leg: Edema (1+ pitting) present.  Skin:    Capillary Refill: Capillary refill takes less than 2 seconds.  Neurological:     Mental Status: He is alert.   Laboratory examination:   CMP Latest Ref Rng & Units 10/05/2021 09/28/2021 10/28/2020  Glucose 70 - 99 mg/dL 141(H) 171(H) -  BUN 8 - 23 mg/dL '23 21 21  ' Creatinine 0.61 - 1.24 mg/dL 1.10 1.27(H) 1.1  Sodium 135 - 145 mmol/L 134(L) 137 140  Potassium 3.5 - 5.1 mmol/L 4.6 4.3 3.9  Chloride 98 - 111 mmol/L 104 104 108  CO2 22 - 32 mmol/L 24 25 26(A)  Calcium 8.9 - 10.3 mg/dL 8.8(L) 8.9 8.2(A)  Total Protein 6.5 - 8.1 g/dL - 6.2(L) -  Total Bilirubin 0.3 - 1.2 mg/dL - 0.7 -  Alkaline Phos 38 - 126 U/L - 74 104  AST 15 - 41 U/L - 26 15  ALT 0 - 44 U/L - 25 13   CBC Latest Ref Rng & Units 10/27/2021 10/18/2021 10/06/2021  WBC - 6.8 10.1 17.8(H)  Hemoglobin 13.5 - 17.5 9.1(A) 9.0(A) 9.0(L)  Hematocrit 41 - 53 28(A) 28(A) 27.7(L)  Platelets 150 - 399 227 273  170   Lipid Panel     Component Value Date/Time   CHOL 137 09/27/2020 0000   TRIG 92 09/27/2020 0000   HDL 44 09/27/2020 0000   LDLCALC 75 09/27/2020 0000   HEMOGLOBIN A1C No results found for: HGBA1C, MPG TSH No results for  input(s): TSH in the last 8760 hours.  External labs:  02/16/2020: Serum glucose 105 mg, BUN 22, creatinine 1.7, EGFR 37.9 mL, potassium 4.7. Hb 12.3/HCT 40.9, platelets 190. Total cholesterol 162, triglycerides 70, HDL 63, LDL 85.  Non-HDL cholesterol 99. PSA normal   Allergies  No Known Allergies   Medications Prior to Visit:   Outpatient Medications Prior to Visit  Medication Sig Dispense Refill   acetaminophen (TYLENOL) 325 MG tablet Take 650 mg by mouth 3 (three) times daily.     amiodarone (PACERONE) 200 MG tablet Take 200 mg by mouth daily.     apixaban (ELIQUIS) 2.5 MG TABS tablet Take 1 tablet (2.5 mg total) by mouth 2 (two) times daily. 180 tablet 1   atorvastatin (LIPITOR) 10 MG tablet Take 10 mg by mouth every evening.   12   cyanocobalamin 1000 MCG tablet Take 1,000 mcg by mouth every morning.     diltiazem (CARDIZEM CD) 120 MG 24 hr capsule Take 120 mg by mouth daily.     docusate sodium (COLACE) 100 MG capsule Take 1 capsule (100 mg total) by mouth 2 (two) times daily. 10 capsule 0   furosemide (LASIX) 20 MG tablet Take 20 mg by mouth daily.     HYDROcodone-acetaminophen (NORCO/VICODIN) 5-325 MG tablet Take 1 tablet by mouth 2 (two) times daily as needed for up to 14 days for severe pain. 42 tablet 0   magnesium hydroxide (MILK OF MAGNESIA) 400 MG/5ML suspension Take 30 mLs by mouth daily as needed for mild constipation.     melatonin 5 MG TABS Take 5 mg by mouth at bedtime.      methocarbamol (ROBAXIN) 500 MG tablet Take 1 tablet (500 mg total) by mouth every 8 (eight) hours as needed for muscle spasms. 40 tablet 0   mirtazapine (REMERON) 15 MG tablet Take 15 mg by mouth at bedtime.     Multiple Vitamins-Minerals (PRESERVISION AREDS  2+MULTI VIT PO) Take 1 capsule by mouth in the morning and at bedtime.     omeprazole (PRILOSEC) 20 MG capsule Take 20 mg by mouth daily with breakfast.      polyethylene glycol (MIRALAX / GLYCOLAX) 17 g packet Take 17 g by mouth daily as needed.     predniSONE (DELTASONE) 5 MG tablet Take 5 mg by mouth daily.     senna-docusate (SENOKOT-S) 8.6-50 MG tablet Take 2 tablets by mouth daily.     tamsulosin (FLOMAX) 0.4 MG CAPS capsule Take 0.4 mg by mouth every morning.     timolol (TIMOPTIC) 0.5 % ophthalmic solution Place 1 drop into both eyes daily.     zinc oxide 20 % ointment Apply 1 application topically as needed for irritation.     predniSONE 5 MG TBEC Take 5 mg by mouth daily.     No facility-administered medications prior to visit.   Final Medications at End of Visit    Current Meds  Medication Sig   acetaminophen (TYLENOL) 325 MG tablet Take 650 mg by mouth 3 (three) times daily.   amiodarone (PACERONE) 200 MG tablet Take 200 mg by mouth daily.   apixaban (ELIQUIS) 2.5 MG TABS tablet Take 1 tablet (2.5 mg total) by mouth 2 (two) times daily.   atorvastatin (LIPITOR) 10 MG tablet Take 10 mg by mouth every evening.    cyanocobalamin 1000 MCG tablet Take 1,000 mcg by mouth every morning.   diltiazem (CARDIZEM CD) 120 MG 24 hr capsule Take 120 mg by mouth daily.  docusate sodium (COLACE) 100 MG capsule Take 1 capsule (100 mg total) by mouth 2 (two) times daily.   furosemide (LASIX) 20 MG tablet Take 20 mg by mouth daily.   HYDROcodone-acetaminophen (NORCO/VICODIN) 5-325 MG tablet Take 1 tablet by mouth 2 (two) times daily as needed for up to 14 days for severe pain.   magnesium hydroxide (MILK OF MAGNESIA) 400 MG/5ML suspension Take 30 mLs by mouth daily as needed for mild constipation.   melatonin 5 MG TABS Take 5 mg by mouth at bedtime.    methocarbamol (ROBAXIN) 500 MG tablet Take 1 tablet (500 mg total) by mouth every 8 (eight) hours as needed for muscle spasms.   mirtazapine  (REMERON) 15 MG tablet Take 15 mg by mouth at bedtime.   Multiple Vitamins-Minerals (PRESERVISION AREDS 2+MULTI VIT PO) Take 1 capsule by mouth in the morning and at bedtime.   omeprazole (PRILOSEC) 20 MG capsule Take 20 mg by mouth daily with breakfast.    polyethylene glycol (MIRALAX / GLYCOLAX) 17 g packet Take 17 g by mouth daily as needed.   predniSONE (DELTASONE) 5 MG tablet Take 5 mg by mouth daily.   senna-docusate (SENOKOT-S) 8.6-50 MG tablet Take 2 tablets by mouth daily.   tamsulosin (FLOMAX) 0.4 MG CAPS capsule Take 0.4 mg by mouth every morning.   timolol (TIMOPTIC) 0.5 % ophthalmic solution Place 1 drop into both eyes daily.   zinc oxide 20 % ointment Apply 1 application topically as needed for irritation.   Radiology:   Chest x-ray PA and lateral view 02/16/2020: Mild hyperextension, patchy right basilar infiltrate, borderline cardiomegaly, atherosclerosis, thoracic levoscoliosis and spondylosis.  Cardiac Studies:   Echocardiogram 02/26/2020:  Normal LV systolic function with visual EF 60-65%. Left ventricle cavity is normal in size. Mild left ventricular hypertrophy. Normal global wall  motion. Doppler evidence of grade II diastolic dysfunction. Calculated EF 66%.  Left atrial cavity is severely dilated, 8m/m2.  Right atrial cavity is dilated.  No evidence of aortic stenosis. Mild aortic regurgitation.  Mild to moderate mitral regurgitation.  Moderate tricuspid regurgitation. Moderate pulmonary hypertension. RVSP measures 51 mmHg.  IVC is dilated with a respiratory response of <50%.  No prior study for comparison.  Event monitor 04/27/2020 through 05/17/2020: Patient had paroxysmal episodes of atrial fibrillation with 4% atrial fibrillation burden. Predominant rhythm was sinus rhythm with nonconducted P waves and 1: 1 Mobitz 2 AV  block at 10:21 PM on 05/10/2020. Patient also had a 3.4-second ventricular standstill at 10:35 PM on 05/04/2020   DFellowsMRI Dual Chamber pacemaker 06/03/2018   Scheduled  In office pacemaker check 10/28/20  Single (S)/Dual (D)/BV: D. Presenting AFVS. Pacemaker dependant:  No. Underlying AF with VR 80-100, irregular. AP 16%, VP 12%.  AMS Episodes 4178.  AT/AF burden 21% . Longest 1D7H. Latest Ongoing since 8AM today. HVR 1. Longest 9Sec, 28 beat NSVT @ 192/min.   Longevity 9.6-11.1 Years. Magnet rate: >85%. Lead measurements: Stable..Marland KitchenHistogram: Low (L)/normal (N)/high (H)  Normal. Patient activity Decreased since Oct 2021.   Observations: Normal pacemaker function.  Frequent episodes of atrial fibrillation, since September 02, 2020, A. fib burden has been ranging from 17 up to 48%. Changes: None.   Scheduled remote transmission 06/02/2021:  Normal device function.    Known PAF, on OAC, AF burden is 6.7% of the time  Next remote 91 days.   EKG   02/09/2021: Sinus rhythm with first-degree AV block at a rate of 60 bpm.  Left atrial abnormality.  Left axis, left anterior fascicular block.  Right bundle branch block.  Nonspecific T wave abnormality.  EKG 11/11/2020: Atrial paced rhythm with underlying sinus bradycardia with first-degree AV block at a rate of 62 bpm.  Left axis, left anterior fascicular block.  Right bundle branch block. Biventricular block  EKG 06/02/2020: Underlying sinus bradycardia at rate of 30 bpm with complete heart block and junctional escape, frequent PACs in bigeminal pattern that appeared to be conducted.  Left axis deviation, left anterior fascicular block.  Right bundle branch block.   EKG 03/29/2020: Sinus rhythm with first-degree block at rate of 83 bpm, leftward enlargement, left axis deviation, left intrafascicular block.  Right bundle branch block.  Bifascicular block.  Low-voltage complexes.  Nonspecific T abnormality.  EKG 03/22/2020, PCP EKG.  Sinus tachycardia at rate of 100 bpm with 2: 1 AV conduction, Mobitz 2 AV block 1: 1.  Left axis deviation, left anterior  fascicular block.  Right bundle branch block.  02/20/2020: Sinus bradycardia with first-degree AV block at the rate of 52 bpm, left axis deviation, left anterior fascicular block.  Right bundle branch block.  Trifascicular block.  Low-voltage complexes.  No evidence of ischemia.  02/20/2020: Atrial fibrillation with controlled ventricular response at rate of 49 bpm, left axis deviation, left anterior fascicular block.  Right bundle branch block.  Nonspecific T abnormality.    Assessment     ICD-10-CM   1. DOE (dyspnea on exertion)  R06.09 B Nat Peptide    2. Tachycardia-bradycardia syndrome ([place])  I49.5     3. Essential hypertension  I10     4. Paroxysmal atrial fibrillation (HCC)  I48.0       CHA2DS2-VASc Score is 3.  Yearly risk of stroke: 3.2% (A, HTN).  No orders of the defined types were placed in this encounter.    Medications Discontinued During This Encounter  Medication Reason   predniSONE 5 MG TBEC Duplicate    Recommendations:   Stephen Cuevas  is a 48 y.o. Caucasian male who resides at [facility] assisted living facility and has history of hyperlipidemia, hypertension, sick sinus syndrome with paroxysmal atrial fibrillation, complete heart block who underwent permanent pacemaker implantation on 06/04/2020 for marked dyspnea, fatigue and dizziness.   Patient presents for 59-monthfollow-up.  He underwent left hip arthroplasty 10/04/2021, postop period was uneventful.  He now presents for follow-up of hypertension, hyperlipidemia, and sick sinus syndrome.  Patient is recovering well from surgery.  However he does continue to have dyspnea on exertion which I suspect is multifactorial.  We will obtain BNP at this time.  Likely age-related deconditioning, postoperative deconditioning, and potentially underlying lung pathology are contributing to dyspnea.  Patient is wheezing on exam, advised the patient follow-up with PCP for further evaluation.  He is tolerating  anticoagulation without bleeding diathesis, will continue Eliquis.  If BNP is elevated would recommend repeat echocardiogram.  Could consider ischemic evaluation, however given advanced age we will hold off at this time.  Follow-up in 3 months, sooner if needed.  We will also schedule patient for pacemaker check.   CAlethia Berthold PA-C 11/10/2021, 12:08 PM Office: [phone]

## 2021-11-21 LAB — CBC: RBC: 3.71 — AB (ref 3.87–5.11)

## 2021-11-21 LAB — CBC AND DIFFERENTIAL
HCT: 35 — AB (ref 41–53)
Hemoglobin: 11.5 — AB (ref 13.5–17.5)
Neutrophils Absolute: 1450
Platelets: 220 10*3/uL (ref 150–400)
WBC: 10

## 2021-12-01 ENCOUNTER — Ambulatory Visit (INDEPENDENT_AMBULATORY_CARE_PROVIDER_SITE_OTHER): Payer: Medicare Other

## 2021-12-01 DIAGNOSIS — I495 Sick sinus syndrome: Secondary | ICD-10-CM

## 2021-12-01 LAB — CUP PACEART REMOTE DEVICE CHECK
Battery Remaining Longevity: 107 mo
Battery Remaining Percentage: 90 %
Battery Voltage: 3.02 V
Brady Statistic AP VP Percent: 7.9 %
Brady Statistic AP VS Percent: 27 %
Brady Statistic AS VP Percent: 1.8 %
Brady Statistic AS VS Percent: 63 %
Brady Statistic RA Percent Paced: 34 %
Brady Statistic RV Percent Paced: 12 %
Date Time Interrogation Session: 20230119035730
Implantable Lead Implant Date: 20210722
Implantable Lead Implant Date: 20210722
Implantable Lead Location: 753859
Implantable Lead Location: 753860
Implantable Pulse Generator Implant Date: 20210722
Lead Channel Impedance Value: 390 Ohm
Lead Channel Impedance Value: 440 Ohm
Lead Channel Pacing Threshold Amplitude: 0.875 V
Lead Channel Pacing Threshold Amplitude: 1 V
Lead Channel Pacing Threshold Pulse Width: 0.4 ms
Lead Channel Pacing Threshold Pulse Width: 0.4 ms
Lead Channel Sensing Intrinsic Amplitude: 1.5 mV
Lead Channel Sensing Intrinsic Amplitude: 6.8 mV
Lead Channel Setting Pacing Amplitude: 2 V
Lead Channel Setting Pacing Amplitude: 2.5 V
Lead Channel Setting Pacing Pulse Width: 0.4 ms
Lead Channel Setting Sensing Sensitivity: 2 mV
Pulse Gen Model: 2272
Pulse Gen Serial Number: 3851047

## 2021-12-14 NOTE — Progress Notes (Signed)
Remote pacemaker transmission.   

## 2021-12-23 ENCOUNTER — Encounter: Payer: Self-pay | Admitting: Orthopedic Surgery

## 2021-12-23 ENCOUNTER — Non-Acute Institutional Stay (INDEPENDENT_AMBULATORY_CARE_PROVIDER_SITE_OTHER): Payer: Medicare Other | Admitting: Orthopedic Surgery

## 2021-12-23 DIAGNOSIS — Z0000 Encounter for general adult medical examination without abnormal findings: Secondary | ICD-10-CM | POA: Diagnosis not present

## 2021-12-23 NOTE — Progress Notes (Signed)
Subjective:   Stephen Cuevas is a 88 y.o. male who presents for Medicare Annual/Subsequent preventive examination.  Review of Systems     Cardiac Risk Factors include: advanced age (>26men, >45 women);hypertension;sedentary lifestyle;male gender;dyslipidemia     Objective:    Today's Vitals   12/23/21 1228  BP: 107/62  Pulse: 61  Resp: 18  Temp: (!) 97.4 F (36.3 C)  SpO2: 94%  Weight: 158 lb 12.8 oz (72 kg)  Height: 5\' 6"  (1.676 m)   Body mass index is 25.63 kg/m.  Advanced Directives 11/09/2021 11/01/2021 10/11/2021 10/04/2021 10/04/2021 09/28/2021 07/28/2021  Does Patient Have a Medical Advance Directive? Yes Yes Yes Yes Yes Yes Yes  Type of Paramedic of [place];Living will [place];Living will [place];Living will [place];Living will [place];Living will [place];Living will [place];Living will;Out of facility DNR (pink MOST or yellow form)  Does patient want to make changes to medical advance directive? No - Patient declined No - Patient declined No - Patient declined No - Patient declined - - No - Patient declined  Copy of [place] in Chart? Yes - validated most recent copy scanned in chart (See row information) Yes - validated most recent copy scanned in chart (See row information) Yes - validated most recent copy scanned in chart (See row information) - No - copy requested - Yes - validated most recent copy scanned in chart (See row information)  Would patient like information on creating a medical advance directive? - - - - - - -  Pre-existing out of facility DNR order (yellow form or pink MOST form) - - - - - - Yellow form placed in chart (order not valid for inpatient use)    Current Medications (verified) Outpatient Encounter Medications as of 12/23/2021  Medication Sig   acetaminophen (TYLENOL)  325 MG tablet Take 650 mg by mouth 3 (three) times daily.   amiodarone (PACERONE) 200 MG tablet Take 200 mg by mouth daily.   apixaban (ELIQUIS) 2.5 MG TABS tablet Take 1 tablet (2.5 mg total) by mouth 2 (two) times daily.   atorvastatin (LIPITOR) 10 MG tablet Take 10 mg by mouth every evening.    cyanocobalamin 1000 MCG tablet Take 1,000 mcg by mouth every morning.   diltiazem (CARDIZEM CD) 120 MG 24 hr capsule Take 120 mg by mouth daily.   docusate sodium (COLACE) 100 MG capsule Take 1 capsule (100 mg total) by mouth 2 (two) times daily.   furosemide (LASIX) 20 MG tablet Take 20 mg by mouth daily.   magnesium hydroxide (MILK OF MAGNESIA) 400 MG/5ML suspension Take 30 mLs by mouth daily as needed for mild constipation.   melatonin 5 MG TABS Take 5 mg by mouth at bedtime.    methocarbamol (ROBAXIN) 500 MG tablet Take 1 tablet (500 mg total) by mouth every 8 (eight) hours as needed for muscle spasms.   mirtazapine (REMERON) 15 MG tablet Take 15 mg by mouth at bedtime.   Multiple Vitamins-Minerals (PRESERVISION AREDS 2+MULTI VIT PO) Take 1 capsule by mouth in the morning and at bedtime.   omeprazole (PRILOSEC) 20 MG capsule Take 20 mg by mouth daily with breakfast.    polyethylene glycol (MIRALAX / GLYCOLAX) 17 g packet Take 17 g by mouth daily as needed.   predniSONE (DELTASONE) 5 MG tablet Take 5 mg by mouth daily.   senna-docusate (SENOKOT-S) 8.6-50 MG tablet Take  2 tablets by mouth daily.   tamsulosin (FLOMAX) 0.4 MG CAPS capsule Take 0.4 mg by mouth every morning.   timolol (TIMOPTIC) 0.5 % ophthalmic solution Place 1 drop into both eyes daily.   zinc oxide 20 % ointment Apply 1 application topically as needed for irritation.   No facility-administered encounter medications on file as of 12/23/2021.    Allergies (verified) Patient has no known allergies.   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   History of kidney stones    Hypertension    Kidney stone    Presence of permanent cardiac pacemaker 06/03/2020   SCC (squamous cell carcinoma) 07/17/2008   SCC WELL DIFF (RIGHT HELICAL MARGIN)(MOHS)   SCC (squamous cell carcinoma) 03/24/2013   SCC IN SITU (BACK SCALP)(TX AFTER BX)   SCC (squamous cell carcinoma) 03/24/2013   SCC IN SITU (RIGHT SCALP)(TX AFTER BX)   SCC (squamous cell carcinoma) 03/24/2013   SCC IN SITU (MID SCALP)(TX AFTER BX)   SCC (squamous cell carcinoma) 03/24/2013   SCC IN SITU (MID NECK)(TX AFTER BX)   Spinal stenosis    Squamous cell carcinoma of skin 11/25/1997   ant right scalp    Tachy-brady syndrome ([place]) 06/03/2020   Past Surgical History:  Procedure Laterality Date   addnoid     CYSTOSCOPY W/ URETEROSCOPY  1980   INSERT / REPLACE / REMOVE PACEMAKER  06/03/2020   KNEE SURGERY     NAILBED REPAIR Left 06/27/2016   Procedure: NAILBED biopsy left thumb;  Surgeon: Daryll Brod, MD;  Location: [facility];  Service: Orthopedics;  Laterality: Left;  FAB   PACEMAKER IMPLANT N/A 06/03/2020   Procedure: PACEMAKER IMPLANT;  Surgeon: Constance Haw, MD;  Location: [facility];  Service: Cardiovascular;  Laterality: N/A;   TONSILLECTOMY     TOTAL HIP ARTHROPLASTY Left 10/04/2021   Procedure: TOTAL HIP ARTHROPLASTY ANTERIOR APPROACH;  Surgeon: Paralee Cancel, MD;  Location: [facility];  Service: Orthopedics;  Laterality: Left;   Family History  Problem Relation Age of Onset   Heart failure Father    Heart disease Father    Social History   Socioeconomic History   Marital status: Widowed    Spouse name: Not on file   Number of children: 4   Years of education:  Not on file   Highest education level: Not on file  Occupational History   Occupation: retired  Tobacco Use   Smoking status: Former    Packs/day: 1.50    Years: 20.00    Pack years: 30.00    Types: Cigarettes, Pipe, Cigars    Quit date: 03/13/1956    Years since quitting: 65.8   Smokeless tobacco: Never  Vaping Use   Vaping Use: Never used  Substance and Sexual Activity   Alcohol use: Yes    Comment: daily- 2 manhattans   Drug use: Never   Sexual activity: Not on file  Other Topics Concern   Not on file  Social History Narrative   Not on file   Social  Determinants of Health   Financial Resource Strain: Low Risk    Difficulty of Paying Living Expenses: Not hard at all  Food Insecurity: No Food Insecurity   Worried About [place] in the Last Year: Never true   Ran Out of Food in the Last Year: Never true  Transportation Needs: No Transportation Needs   Lack of Transportation (Medical): No   Lack of Transportation (Non-Medical): No  Physical Activity: Insufficiently Active   Days of Exercise per Week: 5 days   Minutes of Exercise per Session: 10 min  Stress: No Stress Concern Present   Feeling of Stress : Not at all  Social Connections: Moderately Integrated   Frequency of Communication with Friends and Family: Three times a week   Frequency of Social Gatherings with Friends and Family: Once a week   Attends Religious Services: 1 to 4 times per year   Active Member of [employer] or Organizations: Yes   Attends Archivist Meetings: 1 to 4 times per year   Marital Status: Widowed    Tobacco Counseling Counseling given: Not Answered   Clinical Intake:  Pre-visit preparation completed: Yes  Pain : No/denies pain     BMI - recorded: 25.63 Nutritional Status: BMI 25 -29 Overweight Nutritional Risks: None Diabetes: No  How often do you need to have someone help you when you read instructions, pamphlets, or other written materials from your doctor  or pharmacy?: 3 - Sometimes What is the last grade level you completed in school?: 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 No  Interpreter Needed?: No      Activities of Daily Living In your present state of health, do you have any difficulty performing the following activities: 12/23/2021 10/04/2021  Hearing? N Y  Vision? N N  Difficulty concentrating or making decisions? N Y  Walking or climbing stairs? Y Y  Dressing or bathing? N N  Doing errands, shopping? Tempie Donning  Preparing Food and eating ? Y -  Using the Toilet? N -  In the past six months, have you accidently leaked urine? Y -  Do you have problems with loss of bowel control? N -  Managing your Medications? Y -  Managing your Finances? N -  Housekeeping or managing your Housekeeping? Y -  Some recent data might be hidden    Patient Care Team: Virgie Dad, MD as PCP - General (Internal Medicine) Lavonna Monarch, MD as Consulting Physician (Dermatology)  Indicate any recent Medical Services you may have received from other than [facility] providers in the past year (date may be approximate).     Assessment:   This is a routine wellness examination for [facility].  Hearing/Vision screen No results found.  Dietary issues and exercise activities discussed: Current Exercise Habits: The patient does not participate in regular exercise at present, Exercise limited by: orthopedic condition(s);cardiac condition(s)   Goals Addressed             This Visit's Progress    Maintain Mobility and Function   On track    Evidence-based guidance:  Acknowledge and validate impact of pain, loss of strength and potential disfigurement (hand osteoarthritis) on mental health and daily life, such as social isolation, anxiety, depression, impaired sexual relationship and   injury from falls.  Anticipate referral to physical or occupational therapy for assessment, therapeutic exercise and recommendation for adaptive equipment or  assistive devices; encourage participation.  Assess impact on ability to perform activities of daily  living, as well as engage in sports and leisure events or requirements of work or school.  Provide anticipatory guidance and reassurance about the benefit of exercise to maintain function; acknowledge and normalize fear that exercise may worsen symptoms.  Encourage regular exercise, at least 10 minutes at a time for 45 minutes per week; consider yoga, water exercise and proprioceptive exercises; encourage use of wearable activity tracker to increase motivation and adherence.  Encourage maintenance or resumption of daily activities, including employment, as pain allows and with minimal exposure to trauma.  Assist patient to advocate for adaptations to the work environment.  Consider level of pain and function, gender, age, lifestyle, patient preference, quality of life, readiness and capacity to benefit when recommending patients for orthopaedic surgery consultation.  Explore strategies, such as changes to medication regimen or activity that enables patient to anticipate and manage flare-ups that increase deconditioning and disability.  Explore patient preferences; encourage exposure to a broader range of activities that have been avoided for fear of experiencing pain.  Identify barriers to participation in therapy or exercise, such as pain with activity, anticipated or imagined pain.  Monitor postoperative joint replacement or any preexisting joint replacement for ongoing pain and loss of function; provide social support and encouragement throughout recovery.   Notes:        Depression Screen PHQ 2/9 Scores 12/23/2021  PHQ - 2 Score 0    Fall Risk Fall Risk  12/23/2021  Falls in the past year? 1  Number falls in past yr: 1  Injury with Fall? 1  Risk for fall due to : History of fall(s);Impaired balance/gait;Impaired mobility;Orthopedic patient  Follow up Falls evaluation  completed;Education provided;Falls prevention discussed    FALL RISK PREVENTION PERTAINING TO THE HOME:  Any stairs in or around the home? No  If so, are there any without handrails? No  Home free of loose throw rugs in walkways, pet beds, electrical cords, etc? Yes  Adequate lighting in your home to reduce risk of falls? Yes   ASSISTIVE DEVICES UTILIZED TO PREVENT FALLS:  Life alert? No  Use of a cane, walker or w/c? Yes  Grab bars in the bathroom? Yes  Shower chair or bench in shower? Yes  Elevated toilet seat or a handicapped toilet? Yes   TIMED UP AND GO:  Was the test performed? No .  Length of time to ambulate 10 feet: N/A sec.   Gait slow and steady with assistive device  Cognitive Function: MMSE - Mini Mental State Exam 12/23/2021  Orientation to time 5  Orientation to Place 5  Registration 3  Attention/ Calculation 4  Recall 3  Language- name 2 objects 2  Language- repeat 1  Language- follow 3 step command 3  Language- read & follow direction 1  Write a sentence 1  Copy design 1  Total score 29     6CIT Screen 12/23/2021  What Year? 0 points  What month? 0 points  What time? 0 points  Count back from 20 0 points  Months in reverse 2 points  Repeat phrase 0 points  Total Score 2    Immunizations Immunization History  Administered Date(s) Administered   Influenza, High Dose Seasonal PF 08/07/2016   Influenza-Unspecified 08/04/2020, 09/06/2021   Moderna SARS-COV2 Booster Vaccination 09/09/2020   Moderna Sars-Covid-2 Vaccination 11/18/2019, 12/15/2019   Pneumococcal-Unspecified 09/13/2020    TDAP status: Due, Education has been provided regarding the importance of this vaccine. Advised may receive this vaccine at local pharmacy or Health  Dept. Aware to provide a copy of the vaccination record if obtained from local pharmacy or Health Dept. Verbalized acceptance and understanding.  Flu Vaccine status: Up to date  Pneumococcal vaccine status: Due,  Education has been provided regarding the importance of this vaccine. Advised may receive this vaccine at local pharmacy or Health Dept. Aware to provide a copy of the vaccination record if obtained from local pharmacy or Health Dept. Verbalized acceptance and understanding.  Covid-19 vaccine status: Completed vaccines  Qualifies for Shingles Vaccine? Yes   Zostavax completed No   Shingrix Completed?: No.    Education has been provided regarding the importance of this vaccine. Patient has been advised to call insurance company to determine out of pocket expense if they have not yet received this vaccine. Advised may also receive vaccine at local pharmacy or Health Dept. Verbalized acceptance and understanding.  Screening Tests Health Maintenance  Topic Date Due   TETANUS/TDAP  Never done   Zoster Vaccines- Shingrix (1 of 2) Never done   Pneumonia Vaccine 34+ Years old (1 - PCV) 02/14/1993   COVID-19 Vaccine (3 - Moderna risk series) 10/07/2020   INFLUENZA VACCINE  Completed   HPV VACCINES  Aged Out    Health Maintenance  Health Maintenance Due  Topic Date Due   TETANUS/TDAP  Never done   Zoster Vaccines- Shingrix (1 of 2) Never done   Pneumonia Vaccine 11+ Years old (1 - PCV) 02/14/1993   COVID-19 Vaccine (3 - Moderna risk series) 10/07/2020    Colorectal cancer screening: No longer required.   Lung Cancer Screening: (Low Dose CT Chest recommended if Age 39-80 years, 30 pack-year currently smoking OR have quit w/in 15years.) does not qualify.   Lung Cancer Screening Referral: No  Additional Screening:  Hepatitis C Screening: does not qualify; advanced age  Vision Screening: Recommended annual ophthalmology exams for early detection of glaucoma and other disorders of the eye. Is the patient up to date with their annual eye exam?  No  Who is the provider or what is the name of the office in which the patient attends annual eye exams? Cannot recall If pt is not established with  a provider, would they like to be referred to a provider to establish care? No .   Dental Screening: Recommended annual dental exams for proper oral hygiene  Community Resource Referral / Chronic Care Management: CRR required this visit?  No   CCM required this visit?  No      Plan:     I have personally reviewed and noted the following in the patients chart:   Medical and social history Use of alcohol, tobacco or illicit drugs  Current medications and supplements including opioid prescriptions. Patient is not currently taking opioid prescriptions. Functional ability and status Nutritional status Physical activity Advanced directives List of other physicians Hospitalizations, surgeries, and ER visits in previous 12 months Vitals Screenings to include cognitive, depression, and falls Referrals and appointments  In addition, I have reviewed and discussed with patient certain preventive protocols, quality metrics, and best practice recommendations. A written personalized care plan for preventive services as well as general preventive health recommendations were provided to patient.     Yvonna Alanis, NP   12/23/2021

## 2021-12-23 NOTE — Patient Instructions (Signed)
°  [name] , Thank you for taking time to come for your Medicare Wellness Visit. I appreciate your ongoing commitment to your health goals. Please review the following plan we discussed and let me know if I can assist you in the future.   These are the goals we discussed:  Goals      Maintain Mobility and Function     Evidence-based guidance:  Acknowledge and validate impact of pain, loss of strength and potential disfigurement (hand osteoarthritis) on mental health and daily life, such as social isolation, anxiety, depression, impaired sexual relationship and   injury from falls.  Anticipate referral to physical or occupational therapy for assessment, therapeutic exercise and recommendation for adaptive equipment or assistive devices; encourage participation.  Assess impact on ability to perform activities of daily living, as well as engage in sports and leisure events or requirements of work or school.  Provide anticipatory guidance and reassurance about the benefit of exercise to maintain function; acknowledge and normalize fear that exercise may worsen symptoms.  Encourage regular exercise, at least 10 minutes at a time for 45 minutes per week; consider yoga, water exercise and proprioceptive exercises; encourage use of wearable activity tracker to increase motivation and adherence.  Encourage maintenance or resumption of daily activities, including employment, as pain allows and with minimal exposure to trauma.  Assist patient to advocate for adaptations to the work environment.  Consider level of pain and function, gender, age, lifestyle, patient preference, quality of life, readiness and capacity to benefit when recommending patients for orthopaedic surgery consultation.  Explore strategies, such as changes to medication regimen or activity that enables patient to anticipate and manage flare-ups that increase deconditioning and disability.  Explore patient preferences; encourage  exposure to a broader range of activities that have been avoided for fear of experiencing pain.  Identify barriers to participation in therapy or exercise, such as pain with activity, anticipated or imagined pain.  Monitor postoperative joint replacement or any preexisting joint replacement for ongoing pain and loss of function; provide social support and encouragement throughout recovery.   Notes:         This is a list of the screening recommended for you and due dates: Tdap/pneumococcal 20 vaccines recommended. Recommend scheduling annual eye exam.  Health Maintenance  Topic Date Due   Tetanus Vaccine  Never done   Zoster (Shingles) Vaccine (1 of 2) Never done   Pneumonia Vaccine (1 - PCV) 02/14/1993   COVID-19 Vaccine (3 - Moderna risk series) 10/07/2020   Flu Shot  Completed   HPV Vaccine  Aged Out

## 2022-01-17 ENCOUNTER — Ambulatory Visit: Payer: Medicare Other | Admitting: Dermatology

## 2022-01-26 ENCOUNTER — Encounter: Payer: Self-pay | Admitting: Internal Medicine

## 2022-01-26 ENCOUNTER — Non-Acute Institutional Stay: Payer: Medicare Other | Admitting: Internal Medicine

## 2022-01-26 DIAGNOSIS — G8929 Other chronic pain: Secondary | ICD-10-CM

## 2022-01-26 DIAGNOSIS — I495 Sick sinus syndrome: Secondary | ICD-10-CM | POA: Diagnosis not present

## 2022-01-26 DIAGNOSIS — K219 Gastro-esophageal reflux disease without esophagitis: Secondary | ICD-10-CM

## 2022-01-26 DIAGNOSIS — K5901 Slow transit constipation: Secondary | ICD-10-CM

## 2022-01-26 DIAGNOSIS — I482 Chronic atrial fibrillation, unspecified: Secondary | ICD-10-CM

## 2022-01-26 DIAGNOSIS — Z96642 Presence of left artificial hip joint: Secondary | ICD-10-CM | POA: Diagnosis not present

## 2022-01-26 DIAGNOSIS — R350 Frequency of micturition: Secondary | ICD-10-CM

## 2022-01-26 DIAGNOSIS — F5105 Insomnia due to other mental disorder: Secondary | ICD-10-CM

## 2022-01-26 DIAGNOSIS — D649 Anemia, unspecified: Secondary | ICD-10-CM

## 2022-01-26 DIAGNOSIS — F418 Other specified anxiety disorders: Secondary | ICD-10-CM

## 2022-01-26 DIAGNOSIS — M7989 Other specified soft tissue disorders: Secondary | ICD-10-CM

## 2022-01-26 DIAGNOSIS — N401 Enlarged prostate with lower urinary tract symptoms: Secondary | ICD-10-CM

## 2022-01-26 DIAGNOSIS — M5442 Lumbago with sciatica, left side: Secondary | ICD-10-CM

## 2022-01-26 NOTE — Progress Notes (Signed)
?Location:   [facility] Room Number: 37 ?Place of Service:  ALF (13) ?Provider:  Veleta Miners MD  ? ?Virgie Dad, MD ? ?Patient Care Team: ?Virgie Dad, MD as PCP - General (Internal Medicine) ?Lavonna Monarch, MD as Consulting Physician (Dermatology) ? ?Extended Emergency Contact Information ?Primary Emergency Contact: Farrel,Robert L ?Address: [address]         [place], [place] [address] [place] ?Home Phone: [phone] ?Work Phone: [phone] ?Mobile Phone: [phone] ?Relation: Son ?Secondary Emergency Contact: Takach,Chris ?Mobile Phone: [phone] ?Relation: Son ?Preferred language: English ? ?Code Status:  DNR Managed Care ?Goals of care: Advanced Directive information ?Advanced Directives 01/26/2022  ?Does Patient Have a Medical Advance Directive? Yes  ?Type of Advance Directive Out of facility DNR (pink MOST or yellow form);Healthcare Power of Attorney  ?Does patient want to make changes to medical advance directive? No - Patient declined  ?Copy of [place] in Chart? Yes - validated most recent copy scanned in chart (See row information)  ?Would patient like information on creating a medical advance directive? -  ?Pre-existing out of facility DNR order (yellow form or pink MOST form) Yellow form placed in chart (order not valid for inpatient use)  ? ? ? ?Chief Complaint  ?Patient presents with  ? Medical Management of Chronic Issues  ? Quality Metric Gaps  ?  Verified [facility] and [facility] patient is due for: ?TETANUS/TDAP (Every 10 Years)  ?Zoster Vaccines- Shingrix (1 of 2) ?Pneumonia Vaccine 53+ Years old (1 - PCV) ?COVID-19 Vaccine ? ?  ? ? ?HPI:  ?Pt is a 88 y.o. male seen today for medical management of chronic diseases.   ? ?Patient has a history of A. fib on Eliquis, hypertension, hyperlipidemia,  ? right clavicle and right pubic ramus fracture in 10/21 ?H/o Tachybrady s/p Pacemaker  ?Also h/o Chronic Pain in Lower Back  ON  Chronic Prednisone per previous PCP ? ?Underwent Left Hip Arthroplasty in 11/22 ? ?Has done very well since then ?Pain is controlled ?Walks with his walker ?Still working with therapy ?Does get SOB with exertion. No cough ?Now in AL. ?No Falls ?Wt Readings from Last 3 Encounters:  ?01/26/22 168 lb 14.4 oz (76.6 kg)  ?12/23/21 158 lb 12.8 oz (72 kg)  ?11/10/21 169 lb (76.7 kg)  ? ?Developed Stye in his eyes. No Other acute issue ?  ?Past Medical History:  ?Diagnosis Date  ? Ankle edema   ? Anxiety   ? Arthritis   ? Basal cell carcinoma 11/21/2004  ? MID FOREHEAD  ? BCC (basal cell carcinoma of skin) 08/26/2007  ? (LEFT OUTER, ZYGOMATIC ARCH)(CURET3, EXC)  ? Dyspnea   ? Dysrhythmia   ? a-fib/flutter- on eliquis  ? GERD (gastroesophageal reflux disease)   ? no treatment  ? Hard of hearing   ? Heart block AV complete ([place]) 07/31/2020  ? History of kidney stones   ? Hypertension   ? Kidney stone   ? Presence of permanent cardiac pacemaker 06/03/2020  ? SCC (squamous cell carcinoma) 07/17/2008  ? SCC WELL DIFF (RIGHT HELICAL MARGIN)(MOHS)  ? SCC (squamous cell carcinoma) 03/24/2013  ? SCC IN SITU (BACK SCALP)(TX AFTER BX)  ? SCC (squamous cell carcinoma) 03/24/2013  ? SCC IN SITU (RIGHT SCALP)(TX AFTER BX)  ? SCC (squamous cell carcinoma) 03/24/2013  ? SCC IN SITU (MID SCALP)(TX AFTER BX)  ? SCC (squamous cell carcinoma) 03/24/2013  ? SCC IN SITU (MID NECK)(TX AFTER BX)  ?  Spinal stenosis   ? Squamous cell carcinoma of skin 11/25/1997  ? ant right scalp   ? Tachy-brady syndrome ([place]) 06/03/2020  ? ?Past Surgical History:  ?Procedure Laterality Date  ? addnoid    ? CYSTOSCOPY W/ URETEROSCOPY  1980  ? INSERT / REPLACE / REMOVE PACEMAKER  06/03/2020  ? KNEE SURGERY    ? NAILBED REPAIR Left 06/27/2016  ? Procedure: NAILBED biopsy left thumb;  Surgeon: Daryll Brod, MD;  Location: [facility];  Service: Orthopedics;  Laterality: Left;  FAB  ? PACEMAKER IMPLANT N/A 06/03/2020  ? Procedure: PACEMAKER IMPLANT;  Surgeon:  Constance Haw, MD;  Location: [facility];  Service: Cardiovascular;  Laterality: N/A;  ? TONSILLECTOMY    ? TOTAL HIP ARTHROPLASTY Left 10/04/2021  ? Procedure: TOTAL HIP ARTHROPLASTY ANTERIOR APPROACH;  Surgeon: Paralee Cancel, MD;  Location: [facility];  Service: Orthopedics;  Laterality: Left;  ? ? ?No Known Allergies ? ?Allergies as of 01/26/2022   ?No Known Allergies ?  ? ?  ?Medication List  ?  ? ?  ? Accurate as of January 26, 2022 11:56 AM. If you have any questions, ask your nurse or doctor.  ?  ?  ? ?  ? ?acetaminophen 325 MG tablet ?Commonly known as: TYLENOL ?Take 650 mg by mouth 3 (three) times daily. ?  ?amiodarone 200 MG tablet ?Commonly known as: PACERONE ?Take 200 mg by mouth daily. ?  ?amoxicillin 500 MG capsule ?Commonly known as: AMOXIL ?Take 500 mg by mouth as needed. ?  ?apixaban 2.5 MG Tabs tablet ?Commonly known as: Eliquis ?Take 1 tablet (2.5 mg total) by mouth 2 (two) times daily. ?  ?atorvastatin 10 MG tablet ?Commonly known as: LIPITOR ?Take 10 mg by mouth every evening. ?  ?cyanocobalamin 1000 MCG tablet ?Take 1,000 mcg by mouth every morning. ?  ?diltiazem 120 MG 24 hr capsule ?Commonly known as: CARDIZEM CD ?Take 120 mg by mouth daily. ?  ?docusate sodium 100 MG capsule ?Commonly known as: COLACE ?Take 1 capsule (100 mg total) by mouth 2 (two) times daily. ?  ?furosemide 20 MG tablet ?Commonly known as: LASIX ?Take 20 mg by mouth daily. ?  ?magnesium hydroxide 400 MG/5ML suspension ?Commonly known as: MILK OF MAGNESIA ?Take 30 mLs by mouth daily as needed for mild constipation. ?  ?melatonin 5 MG Tabs ?Take 5 mg by mouth at bedtime. ?  ?methocarbamol 500 MG tablet ?Commonly known as: ROBAXIN ?Take 1 tablet (500 mg total) by mouth every 8 (eight) hours as needed for muscle spasms. ?  ?mirtazapine 15 MG tablet ?Commonly known as: REMERON ?Take 15 mg by mouth at bedtime. ?  ?omeprazole 20 MG capsule ?Commonly known as: PRILOSEC ?Take 20 mg by mouth daily with breakfast. ?   ?polyethylene glycol 17 g packet ?Commonly known as: MIRALAX / GLYCOLAX ?Take 17 g by mouth daily as needed. ?  ?predniSONE 5 MG tablet ?Commonly known as: DELTASONE ?Take 5 mg by mouth daily. ?  ?PRESERVISION AREDS 2+MULTI VIT PO ?Take 1 capsule by mouth in the morning and at bedtime. ?  ?senna-docusate 8.6-50 MG tablet ?Commonly known as: Senokot-S ?Take 2 tablets by mouth daily. ?  ?tamsulosin 0.4 MG Caps capsule ?Commonly known as: FLOMAX ?Take 0.4 mg by mouth every morning. ?  ?timolol 0.5 % ophthalmic solution ?Commonly known as: TIMOPTIC ?Place 1 drop into both eyes daily. ?  ?zinc oxide 20 % ointment ?Apply 1 application topically as needed for irritation. ?  ? ?  ? ? ?  Review of Systems  ?Constitutional:  Negative for activity change, appetite change and unexpected weight change.  ?HENT: Negative.    ?Respiratory:  Positive for shortness of breath. Negative for cough.   ?Cardiovascular:  Negative for leg swelling.  ?Gastrointestinal:  Negative for constipation.  ?Genitourinary:  Negative for frequency.  ?Musculoskeletal:  Positive for gait problem. Negative for arthralgias and myalgias.  ?Skin: Negative.  Negative for rash.  ?Neurological:  Negative for dizziness and weakness.  ?Psychiatric/Behavioral:  Negative for confusion and sleep disturbance.   ?All other systems reviewed and are negative. ? ?Immunization History  ?Administered Date(s) Administered  ? Influenza, High Dose Seasonal PF 08/07/2016  ? Influenza-Unspecified 08/04/2020, 09/06/2021  ? Moderna SARS-COV2 Booster Vaccination 09/09/2020  ? Moderna Sars-Covid-2 Vaccination 11/18/2019, 12/15/2019  ? PFIZER(Purple Top)SARS-COV-2 Vaccination 08/03/2021  ? Pneumococcal-Unspecified 09/13/2020  ? ?Pertinent  Health Maintenance Due  ?Topic Date Due  ? INFLUENZA VACCINE  Completed  ? ?Fall Risk 10/06/2021 10/07/2021 10/07/2021 10/08/2021 12/23/2021  ?Falls in the past year? - - - - 1  ?Was there an injury with Fall? - - - - 1  ?Fall Risk Category  Calculator - - - - 3  ?Fall Risk Category - - - - High  ?Patient Fall Risk Level High fall risk High fall risk High fall risk High fall risk High fall risk  ?Patient at Risk for Falls Due to - - - - History of fall(s);

## 2022-02-08 ENCOUNTER — Other Ambulatory Visit: Payer: Self-pay

## 2022-02-08 ENCOUNTER — Encounter: Payer: Self-pay | Admitting: Orthopedic Surgery

## 2022-02-08 ENCOUNTER — Ambulatory Visit: Payer: Medicare Other | Admitting: Student

## 2022-02-08 ENCOUNTER — Encounter: Payer: Self-pay | Admitting: Student

## 2022-02-08 ENCOUNTER — Non-Acute Institutional Stay: Payer: Medicare Other | Admitting: Orthopedic Surgery

## 2022-02-08 VITALS — BP 131/67 | HR 60 | Temp 97.9°F | Resp 17 | Ht 66.0 in | Wt 170.6 lb

## 2022-02-08 DIAGNOSIS — D649 Anemia, unspecified: Secondary | ICD-10-CM

## 2022-02-08 DIAGNOSIS — I482 Chronic atrial fibrillation, unspecified: Secondary | ICD-10-CM

## 2022-02-08 DIAGNOSIS — N401 Enlarged prostate with lower urinary tract symptoms: Secondary | ICD-10-CM

## 2022-02-08 DIAGNOSIS — F418 Other specified anxiety disorders: Secondary | ICD-10-CM

## 2022-02-08 DIAGNOSIS — R350 Frequency of micturition: Secondary | ICD-10-CM

## 2022-02-08 DIAGNOSIS — M7989 Other specified soft tissue disorders: Secondary | ICD-10-CM

## 2022-02-08 DIAGNOSIS — I495 Sick sinus syndrome: Secondary | ICD-10-CM

## 2022-02-08 DIAGNOSIS — R0600 Dyspnea, unspecified: Secondary | ICD-10-CM

## 2022-02-08 DIAGNOSIS — M5442 Lumbago with sciatica, left side: Secondary | ICD-10-CM

## 2022-02-08 DIAGNOSIS — J302 Other seasonal allergic rhinitis: Secondary | ICD-10-CM

## 2022-02-08 DIAGNOSIS — G8929 Other chronic pain: Secondary | ICD-10-CM

## 2022-02-08 DIAGNOSIS — K219 Gastro-esophageal reflux disease without esophagitis: Secondary | ICD-10-CM

## 2022-02-08 DIAGNOSIS — F5105 Insomnia due to other mental disorder: Secondary | ICD-10-CM

## 2022-02-08 DIAGNOSIS — I10 Essential (primary) hypertension: Secondary | ICD-10-CM

## 2022-02-08 DIAGNOSIS — K5901 Slow transit constipation: Secondary | ICD-10-CM

## 2022-02-08 DIAGNOSIS — Z96642 Presence of left artificial hip joint: Secondary | ICD-10-CM

## 2022-02-08 MED ORDER — CETIRIZINE HCL 10 MG PO TABS: 10.0000 mg | ORAL_TABLET | Freq: Every day | ORAL | 3 refills | Status: DC

## 2022-02-08 MED ORDER — FLUTICASONE PROPIONATE 50 MCG/ACT NA SUSP
1.0000 | Freq: Every day | NASAL | 6 refills | Status: DC
Start: 2022-02-08 — End: 2022-04-07

## 2022-02-08 NOTE — Progress Notes (Signed)
?Location:  [facility] Room Number: AL37/A ?Place of Service:  ALF (13) ?Provider: Yvonna Alanis, NP ? ?Patient Care Team: ?Virgie Dad, MD as PCP - General (Internal Medicine) ?Lavonna Monarch, MD as Consulting Physician (Dermatology) ? ?Extended Emergency Contact Information ?Primary Emergency Contact: Beltran,Robert L ?Address: [address]         [place], [place] [address] [place] ?Home Phone: [phone] ?Work Phone: [phone] ?Mobile Phone: [phone] ?Relation: Son ?Secondary Emergency Contact: Ripp,Chris ?Mobile Phone: [phone] ?Relation: Son ?Preferred language: English ? ?Code Status:  DNR ?Goals of care: Advanced Directive information ? ?  02/08/2022  ?  9:39 AM  ?Advanced Directives  ?Does Patient Have a Medical Advance Directive? Yes  ?Type of Advance Directive Out of facility DNR (pink MOST or yellow form);[place];Living will  ?Does patient want to make changes to medical advance directive? No - Patient declined  ?Copy of [place] in Chart? Yes - validated most recent copy scanned in chart (See row information)  ?Pre-existing out of facility DNR order (yellow form or pink MOST form) Yellow form placed in chart (order not valid for inpatient use)  ? ? ? ?Chief Complaint  ?Patient presents with  ? Acute Visit  ?  Sore throat  ? ? ?HPI:  ?Pt is a 88 y.o. male seen today for an acute visit for sore throat.  ? ?Symptoms of sore throat, watery eyes, and nasal congestion began 03/28. He was given lozenges for his sore throat, did not think they helped. He is able to drink and eat foods without difficulty. Nasal congestion clear. Had allergies int he past, but not often. Denies fever, chills, body aches, cough or malaise.  ? ?Atrial fibrillation- HR controlled with amiodarone and Cardizem, remains on Eliquis for dvt prophylaxis ?Dyspnea- plans to see cardiology today, reports sob with exertion ?Tachy- brady syndrome-  s/p pacemaker  ?Chronic back pain- denies increased pain today, remains on low dose prednisone and scheduled tylenol ?Total left hip arthroplasty- hospitalized 11/22-11/26, ambulates well with walker- no pain, remains on Eliquis for DVT prophylaxis ?Leg swelling- doppler negative, remains on lasix and ted hose daily ?GERD- hgb 11.5 11/21/2021, denies increased reflux, remains on omeprazole ?Insomnia/depression- no mood changes, sleeping well, remains on Remeron ?Constipation- reports daily bowel movements, reports hard stools today, remains on senna and miralax ?BPH- denies increased retention, remains on Flomax ?Anemia- Hgb 11.5 11/21/2021 ? ?Past Medical History:  ?Diagnosis Date  ? Ankle edema   ? Anxiety   ? Arthritis   ? Basal cell carcinoma 11/21/2004  ? MID FOREHEAD  ? BCC (basal cell carcinoma of skin) 08/26/2007  ? (LEFT OUTER, ZYGOMATIC ARCH)(CURET3, EXC)  ? Dyspnea   ? Dysrhythmia   ? a-fib/flutter- on eliquis  ? GERD (gastroesophageal reflux disease)   ? no treatment  ? Hard of hearing   ? Heart block AV complete ([facility] [place] [facility]) 07/31/2020  ? History of kidney stones   ? Hypertension   ? Kidney stone   ? Presence of permanent cardiac pacemaker 06/03/2020  ? SCC (squamous cell carcinoma) 07/17/2008  ? SCC WELL DIFF (RIGHT HELICAL MARGIN)(MOHS)  ? SCC (squamous cell carcinoma) 03/24/2013  ? SCC IN SITU (BACK SCALP)(TX AFTER BX)  ? SCC (squamous cell carcinoma) 03/24/2013  ? SCC IN SITU (RIGHT SCALP)(TX AFTER BX)  ? SCC (squamous cell carcinoma) 03/24/2013  ? SCC IN SITU (MID SCALP)(TX AFTER BX)  ? SCC (squamous cell carcinoma) 03/24/2013  ?  SCC IN SITU (MID NECK)(TX AFTER BX)  ? Spinal stenosis   ? Squamous cell carcinoma of skin 11/25/1997  ? ant right scalp   ? Tachy-brady syndrome ([place]) 06/03/2020  ? ?Past Surgical History:  ?Procedure Laterality Date  ? addnoid    ? CYSTOSCOPY W/ URETEROSCOPY  1980  ? INSERT / REPLACE / REMOVE PACEMAKER  06/03/2020  ? KNEE SURGERY    ? NAILBED REPAIR Left 06/27/2016  ? Procedure:  NAILBED biopsy left thumb;  Surgeon: Daryll Brod, MD;  Location: [facility];  Service: Orthopedics;  Laterality: Left;  FAB  ? PACEMAKER IMPLANT N/A 06/03/2020  ? Procedure: PACEMAKER IMPLANT;  Surgeon: Constance Haw, MD;  Location: [facility];  Service: Cardiovascular;  Laterality: N/A;  ? TONSILLECTOMY    ? TOTAL HIP ARTHROPLASTY Left 10/04/2021  ? Procedure: TOTAL HIP ARTHROPLASTY ANTERIOR APPROACH;  Surgeon: Paralee Cancel, MD;  [facility];  Service: Orthopedics;  Laterality: Left;  ? ? ?No Known Allergies ? ?Outpatient Encounter Medications as of 02/08/2022  ?Medication Sig  ? acetaminophen (TYLENOL) 325 MG tablet Take 650 mg by mouth 3 (three) times daily as needed.  ? amiodarone (PACERONE) 200 MG tablet Take 200 mg by mouth daily.  ? amoxicillin (AMOXIL) 500 MG capsule Take 500 mg by mouth as needed.  ? apixaban (ELIQUIS) 2.5 MG TABS tablet Take 1 tablet (2.5 mg total) by mouth 2 (two) times daily.  ? atorvastatin (LIPITOR) 10 MG tablet Take 10 mg by mouth every evening.   ? cyanocobalamin 1000 MCG tablet Take 1,000 mcg by mouth every morning.  ? diltiazem (CARDIZEM CD) 120 MG 24 hr capsule Take 120 mg by mouth daily.  ? docusate sodium (COLACE) 100 MG capsule Take 1 capsule (100 mg total) by mouth 2 (two) times daily.  ? furosemide (LASIX) 20 MG tablet Take 20 mg by mouth daily.  ? magnesium hydroxide (MILK OF MAGNESIA) 400 MG/5ML suspension Take 30 mLs by mouth daily as needed for mild constipation.  ? melatonin 5 MG TABS Take 5 mg by mouth at bedtime.   ? methocarbamol (ROBAXIN) 500 MG tablet Take 1 tablet (500 mg total) by mouth every 8 (eight) hours as needed for muscle spasms.  ? mirtazapine (REMERON) 15 MG tablet Take 15 mg by mouth at bedtime.  ? Multiple Vitamins-Minerals (PRESERVISION AREDS 2+MULTI VIT PO) Take 1 capsule by mouth in the morning and at bedtime.  ? omeprazole (PRILOSEC) 20 MG capsule Take 20 mg by mouth daily with breakfast.   ? polyethylene glycol  (MIRALAX / GLYCOLAX) 17 g packet Take 17 g by mouth daily as needed.  ? predniSONE (DELTASONE) 5 MG tablet Take 5 mg by mouth daily.  ? senna-docusate (SENOKOT-S) 8.6-50 MG tablet Take 2 tablets by mouth daily.  ? tamsulosin (FLOMAX) 0.4 MG CAPS capsule Take 0.4 mg by mouth every morning.  ? timolol (TIMOPTIC) 0.5 % ophthalmic solution Place 1 drop into both eyes daily.  ? zinc oxide 20 % ointment Apply 1 application topically as needed for irritation.  ? ?No facility-administered encounter medications on file as of 02/08/2022.  ? ? ?Review of Systems  ?Constitutional:  Negative for activity change, appetite change, chills, fatigue and fever.  ?HENT:  Positive for congestion, sneezing and sore throat. Negative for ear pain and trouble swallowing.   ?Eyes:  Negative for visual disturbance.  ?Respiratory:  Positive for shortness of breath. Negative for cough and wheezing.   ?Cardiovascular:  Positive for leg swelling. Negative  for chest pain.  ?Gastrointestinal:  Positive for constipation. Negative for abdominal distention, abdominal pain, blood in stool, diarrhea, nausea and vomiting.  ?Genitourinary:  Positive for frequency. Negative for dysuria and hematuria.  ?Musculoskeletal:  Positive for arthralgias and gait problem.  ?Skin:  Negative for wound.  ?Neurological:  Positive for weakness. Negative for dizziness and headaches.  ?Psychiatric/Behavioral:  Positive for dysphoric mood and sleep disturbance. Negative for confusion. The patient is nervous/anxious.   ? ?Immunization History  ?Administered Date(s) Administered  ? Influenza, High Dose Seasonal PF 08/07/2016  ? Influenza-Unspecified 08/04/2020, 09/06/2021  ? Moderna SARS-COV2 Booster Vaccination 09/09/2020  ? Moderna Sars-Covid-2 Vaccination 11/18/2019, 12/15/2019  ? PFIZER(Purple Top)SARS-COV-2 Vaccination 08/03/2021  ? Pneumococcal-Unspecified 09/13/2020  ? ?Pertinent  Health Maintenance Due  ?Topic Date Due  ? INFLUENZA VACCINE  Completed  ? ? ?  10/06/2021   ?  7:25 PM 10/07/2021  ?  9:00 AM 10/07/2021  ?  8:35 PM 10/08/2021  ?  7:26 AM 12/23/2021  ? 12:37 PM  ?Fall Risk  ?Falls in the past year?     1  ?Was there an injury with Fall?     1  ?Fall Risk Cat

## 2022-02-08 NOTE — Progress Notes (Signed)
? ?Primary Physician/Referring:  Virgie Dad, MD ? ?Patient ID: Stephen Cuevas, male    DOB: 25-Dec-1925, 88 y.o.   MRN: [id number] ? ?Chief Complaint  ?Patient presents with  ? DOE  ? Hypertension  ? HLD  ? SICK SINUS  ?  3 MONTH  ? ?HPI:   ? ?Stephen Cuevas  is a 33 y.o. Caucasian male who resides at [facility] assisted living facility and has history of hyperlipidemia, hypertension, sick sinus syndrome with paroxysmal atrial fibrillation, complete heart block who underwent permanent pacemaker implantation on 06/04/2020 for marked dyspnea, fatigue and dizziness.  Notably his pacemaker is monitored by [name] at [facility]. ? ?Patient presents for 83-monthfollow-up.  Last office visit patient reported dyspnea on exertion, therefore ordered BNP which was unfortunately not done. However since last visit patient's dyspnea has improved. He walks daily and exercises on the recumbent bike 3 days per week.  ? ?Denies chest pain, palpitations, syncope, near syncope. ? ?Patient is accompanied by his son at today's office visit. ? ?Past Medical History:  ?Diagnosis Date  ? Ankle edema   ? Anxiety   ? Arthritis   ? Basal cell carcinoma 11/21/2004  ? MID FOREHEAD  ? BCC (basal cell carcinoma of skin) 08/26/2007  ? (LEFT OUTER, ZYGOMATIC ARCH)(CURET3, EXC)  ? Dyspnea   ? Dysrhythmia   ? a-fib/flutter- on eliquis  ? GERD (gastroesophageal reflux disease)   ? no treatment  ? Hard of hearing   ? [place] 07/[place]  ? History of kidney stones   ? Hypertension   ? Kidney stone   ? Presence of permanent cardiac pacemaker 06/03/2020  ? SCC (squamous cell carcinoma) 07/17/2008  ? SCC WELL DIFF (RIGHT HELICAL MARGIN)(MOHS)  ? SCC (squamous cell carcinoma) 03/24/2013  ? SCC IN SITU (BACK SCALP)(TX AFTER BX)  ? SCC (squamous cell carcinoma) 03/24/2013  ? SCC IN SITU (RIGHT SCALP)(TX AFTER BX)  ? SCC (squamous cell carcinoma) 03/24/2013  ? SCC IN SITU (MID SCALP)(TX AFTER BX)  ? SCC (squamous cell  carcinoma) 03/24/2013  ? SCC IN SITU (MID NECK)(TX AFTER BX)  ? Spinal stenosis   ? Squamous cell carcinoma of skin 11/25/1997  ? ant right scalp   ? [place]ady [place] 06/03/[place]  ? ?Past Surgical History:  ?Procedure Laterality Date  ? addnoid    ? CYSTOSCOPY W/ URETEROSCOPY  1980  ? INSERT / REPLACE / REMOVE PACEMAKER  06/03/2020  ? KNEE SURGERY    ? NAILBED REPAIR Left 06/27/2016  ? Procedure: NAILBED biopsy left thumb;  Surgeon: GDaryll Brod MD;  [place] [facility]  Service: Orthopedics;  Laterality: Left;  FAB  ? PACEMAKER IMPLANT N/A 06/03/2020  ? Procedure: PACEMAKER IMPLANT;  Surgeon: CConstance Haw MD;  [facility];  Service: Cardiovascular;  Laterality: N/A;  ? TONSILLECTOMY    ? TOTAL HIP ARTHROPLASTY Left 10/04/2021  ? Procedure: TOTAL HIP ARTHROPLASTY ANTERIOR APPROACH;  Surgeon: OParalee Cancel MD;  Location: [facility];  Service: Orthopedics;  Laterality: Left;  ? ?Family History  ?Problem Relation Age of Onset  ? Heart failure Father   ? Heart disease Father   ?  ?Social History  ? ?Tobacco Use  ? Smoking status: Former  ?  Packs/day: 1.50  ?  Years: 20.00  ?  Pack years: 30.00  ?  Types: Cigarettes, Pipe, Cigars  ?  Quit date: 03/13/1956  ?  Years since quitting: 65.9  ?  Smokeless tobacco: Never  ?Substance Use Topics  ? Alcohol use: Yes  ?  Comment: daily- 2 manhattans  ? ?Marital Status: Widowed ?ROS  ?Review of Systems  ?Cardiovascular:  Positive for leg swelling (left > right, mild stable). Negative for chest pain, dyspnea on exertion, near-syncope, orthopnea, palpitations and syncope.  ?Objective  ?Blood pressure 131/67, pulse 60, temperature 97.9 ?F (36.6 ?C), temperature source Temporal, resp. rate 17, height '5\' 6"'  (1.676 m), weight 170 lb 9.6 oz (77.4 kg), SpO2 97 %.  ? ?  02/08/2022  ? 11:37 AM 02/08/2022  ?  9:28 AM 01/26/2022  ? 11:47 AM  ?Vitals with BMI  ?Height '5\' 6"'  '5\' 6"'  '5\' 6"'   ?Weight 170 lbs 10 oz 168 lbs 14 oz 168 lbs 14 oz  ?BMI 27.55 27.27  27.27  ?Systolic 725 366 440  ?Diastolic 67 80 68  ?Pulse 60 64 60  ?  ? Physical Exam ?Vitals reviewed.  ?Constitutional:   ?   Comments: Wheelchair bound   ?Cardiovascular:  ?   Rate and Rhythm: Normal rate and regular rhythm.  ?   Pulses: Intact distal pulses.     ?     Carotid pulses are 2+ on the right side and 2+ on the left side. ?     Popliteal pulses are 2+ on the right side and 2+ on the left side.  ?     Dorsalis pedis pulses are 2+ on the right side and 2+ on the left side.  ?     Posterior tibial pulses are 1+ on the right side and 1+ on the left side.  ?   Heart sounds: S1 normal and S2 normal. Murmur heard.  ?Early systolic murmur is present with a grade of 2/6 at the upper right sternal border.  ?  No gallop.  ?   Comments: No JVD.  ? ?Pulmonary:  ?   Effort: Pulmonary effort is normal.  ?   Breath sounds: Normal breath sounds.  ?Musculoskeletal:  ?   Right lower leg: Edema (minimal) present.  ?   Left lower leg: Edema (minimal) present.  ?Skin: ?   Capillary Refill: Capillary refill takes less than 2 seconds.  ?Neurological:  ?   Mental Status: He is alert.  ? ?Laboratory examination:  ? ? ?  Latest Ref Rng & Units 10/05/2021  ?  3:25 AM 09/28/2021  ? 11:33 AM 10/28/2020  ? 12:00 AM  ?CMP  ?Glucose 70 - 99 mg/dL 141   171     ?BUN 8 - 23 mg/dL '23   21   21       ' ?Creatinine 0.61 - 1.24 mg/dL 1.10   1.27   1.1       ?Sodium 135 - 145 mmol/L 134   137   140       ?Potassium 3.5 - 5.1 mmol/L 4.6   4.3   3.9       ?Chloride 98 - 111 mmol/L 104   104   108       ?CO2 22 - 32 mmol/L '24   25   26       ' ?Calcium 8.9 - 10.3 mg/dL 8.8   8.9   8.2       ?Total Protein 6.5 - 8.1 g/dL  6.2     ?Total Bilirubin 0.3 - 1.2 mg/dL  0.7     ?Alkaline Phos 38 - 126 U/L  74   104       ?  AST 15 - 41 U/L  26   15       ?ALT 0 - 44 U/L  25   13       ?  ? This result is from an external source.  ? ? ?  Latest Ref Rng & Units 11/21/2021  ? 12:00 AM 10/27/2021  ? 12:00 AM 10/18/2021  ? 12:00 AM  ?CBC  ?WBC  10.0      6.8       10.1       ?Hemoglobin 13.5 - 17.5 11.5      9.1      9.0       ?Hematocrit 41 - 53 35      28      28       ?Platelets 150 - 400 K/uL 220      227      273       ?  ? This result is from an external source.  ? ?Lipid Panel  ?   ?Component Value Date/Time  ? CHOL 137 09/27/2020 0000  ? TRIG 92 09/27/2020 0000  ? HDL 44 09/27/2020 0000  ? [place] 75 09/27/2020 0000  ? ?HEMOGLOBIN A1C ?No results found for: HGBA1C, MPG ?TSH ?No results for input(s): TSH in the last 8760 hours. ? ?External labs:  ?02/16/2020: ?Serum glucose 105 mg, BUN 22, creatinine 1.7, EGFR 37.9 mL, potassium 4.7. ?Hb 12.3/HCT 40.9, platelets 190. ?Total cholesterol 162, triglycerides 70, HDL 63, LDL 85.  Non-HDL cholesterol 99. ?PSA normal ?  ?Allergies  ?No Known Allergies  ? ?Medications Prior to Visit:  ? ?Outpatient Medications Prior to Visit  ?Medication Sig Dispense Refill  ? acetaminophen (TYLENOL) 325 MG tablet Take 650 mg by mouth 3 (three) times daily as needed.    ? amiodarone (PACERONE) 200 MG tablet Take 200 mg by mouth daily.    ? amoxicillin (AMOXIL) 500 MG capsule Take 500 mg by mouth as needed.    ? apixaban (ELIQUIS) 2.5 MG TABS tablet Take 1 tablet (2.5 mg total) by mouth 2 (two) times daily. 180 tablet 1  ? atorvastatin (LIPITOR) 10 MG tablet Take 10 mg by mouth every evening.   12  ? cyanocobalamin 1000 MCG tablet Take 1,000 mcg by mouth every morning.    ? diltiazem (CARDIZEM CD) 120 MG 24 hr capsule Take 120 mg by mouth daily.    ? docusate sodium (COLACE) 100 MG capsule Take 1 capsule (100 mg total) by mouth 2 (two) times daily. 10 capsule 0  ? furosemide (LASIX) 20 MG tablet Take 20 mg by mouth daily.    ? magnesium hydroxide (MILK OF MAGNESIA) 400 MG/5ML suspension Take 30 mLs by mouth daily as needed for mild constipation.    ? melatonin 5 MG TABS Take 5 mg by mouth at bedtime.     ? methocarbamol (ROBAXIN) 500 MG tablet Take 1 tablet (500 mg total) by mouth every 8 (eight) hours as needed for muscle spasms. 40 tablet 0   ? mirtazapine (REMERON) 15 MG tablet Take 15 mg by mouth at bedtime.    ? Multiple Vitamins-Minerals (PRESERVISION AREDS 2+MULTI VIT PO) Take 1 capsule by mouth in the morning and at bedtime.    ? Broadus John

## 2022-02-10 ENCOUNTER — Encounter: Payer: Self-pay | Admitting: Nurse Practitioner

## 2022-02-10 ENCOUNTER — Non-Acute Institutional Stay: Payer: Medicare Other | Admitting: Nurse Practitioner

## 2022-02-10 DIAGNOSIS — F418 Other specified anxiety disorders: Secondary | ICD-10-CM

## 2022-02-10 DIAGNOSIS — D649 Anemia, unspecified: Secondary | ICD-10-CM

## 2022-02-10 DIAGNOSIS — E871 Hypo-osmolality and hyponatremia: Secondary | ICD-10-CM

## 2022-02-10 DIAGNOSIS — J209 Acute bronchitis, unspecified: Secondary | ICD-10-CM

## 2022-02-10 DIAGNOSIS — N1831 Chronic kidney disease, stage 3a: Secondary | ICD-10-CM

## 2022-02-10 DIAGNOSIS — I10 Essential (primary) hypertension: Secondary | ICD-10-CM

## 2022-02-10 DIAGNOSIS — M5432 Sciatica, left side: Secondary | ICD-10-CM

## 2022-02-10 DIAGNOSIS — K219 Gastro-esophageal reflux disease without esophagitis: Secondary | ICD-10-CM

## 2022-02-10 DIAGNOSIS — K5901 Slow transit constipation: Secondary | ICD-10-CM

## 2022-02-10 DIAGNOSIS — R609 Edema, unspecified: Secondary | ICD-10-CM

## 2022-02-10 DIAGNOSIS — E785 Hyperlipidemia, unspecified: Secondary | ICD-10-CM

## 2022-02-10 DIAGNOSIS — Z950 Presence of cardiac pacemaker: Secondary | ICD-10-CM | POA: Diagnosis not present

## 2022-02-10 DIAGNOSIS — R350 Frequency of micturition: Secondary | ICD-10-CM

## 2022-02-10 DIAGNOSIS — I482 Chronic atrial fibrillation, unspecified: Secondary | ICD-10-CM

## 2022-02-10 DIAGNOSIS — F5105 Insomnia due to other mental disorder: Secondary | ICD-10-CM

## 2022-02-10 NOTE — Progress Notes (Signed)
?Location:  [place] Room Number: AL37/A ?Place of Service:  ALF (13) ?Provider: Gulianna Hornsby X, NP ? ?Patient Care Team: ?Virgie Dad, MD as PCP - General (Internal Medicine) ?Lavonna Monarch, MD as Consulting Physician (Dermatology) ? ?Extended Emergency Contact Information ?Primary Emergency Contact: Sadler,Robert L ?Address: [address]         [place], [place] [address] [place] ?Home Phone: [phone] ?Work Phone: [phone] ?Mobile Phone: [phone] ?Relation: Son ?Secondary Emergency Contact: Monestime,Chris ?Mobile Phone: [phone] ?Relation: Son ?Preferred language: English ? ?Code Status:  DNR ?Goals of care: Advanced Directive information ? ?  02/10/2022  ?  4:31 PM  ?Advanced Directives  ?Does Patient Have a Medical Advance Directive? Yes  ?Type of Advance Directive Out of facility DNR (pink MOST or yellow form);[place];Living will  ?Does patient want to make changes to medical advance directive? No - Patient declined  ?Copy of [place] in Chart? Yes - validated most recent copy scanned in chart (See row information)  ?Pre-existing out of facility DNR order (yellow form or pink MOST form) Yellow form placed in chart (order not valid for inpatient use)  ? ? ? ?Chief Complaint  ?Patient presents with  ? Acute Visit  ?  Wheezing cough  ? ? ?HPI:  ?Pt is a 88 y.o. male seen today for an acute visit for persisted cough, wheezing, yellowish phlegm production. Denied chest pain, no O2 desaturation, he is afebrile. Already on Flonase and Zyrtec.  ? ?  ?            Constipation, on Senokot S, Colace, MiraLax ?            Anemia, baseline Hgb 9s, Normal Vit B12, Fe stores. On B12 ?            Pacemaker 07/04/18, Hx of tachy brady syndrome,   f/u [name], interrogation for PM issues.  ?            AFib, Eliquis, CHA2DS2-VAS score >3, f/u Dr. [name], on  Amiodarone, Diltiazem ?            HTN, takes Diltiazem, Furosemide,   Bun/creat 23/1.2 10/05/21 ?            CKD Bun/creat 26/1.25 10/11/21 ?            Hyponatremia, Na 134 10/05/21 ?            Urinary frequency, takes Tamsulosin ?            GERD, takes Omeprazole, Hgb 11.5 11/21/21 ?            Depression/insmonia, takes Mirtazapine ?            Hyperlipidemia, takes Atorvastatin, LDL 75 09/27/20 ?            BLE edema, takes Furosemide, 2016 echocardiogram EF 55-60%> Bun/creat 23/1.10 10/05/21 ? OA multiple sites, 11/22 left total hip arthroplasty. Prednisone qd for chronic left sciatica pain. takes Tylenol.  ?Past Medical History:  ?Diagnosis Date  ? Ankle edema   ? Anxiety   ? Arthritis   ? Basal cell carcinoma 11/21/2004  ? MID FOREHEAD  ? BCC (basal cell carcinoma of skin) 08/26/2007  ? (LEFT OUTER, ZYGOMATIC ARCH)(CURET3, EXC)  ? Dyspnea   ? Dysrhythmia   ? a-fib/flutter- on eliquis  ? GERD (gastroesophageal reflux disease)   ? no treatment  ? Hard of hearing   ? 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 History of kidney stones   ? Hypertension   ? Kidney stone   ? Presence of permanent cardiac pacemaker 06/03/2020  ? SCC (squamous cell carcinoma) 07/17/2008  ? SCC WELL DIFF (RIGHT HELICAL MARGIN)(MOHS)  ? SCC (squamous cell carcinoma) 03/24/2013  ? SCC IN SITU (BACK SCALP)(TX AFTER BX)  ? SCC (squamous cell carcinoma) 03/24/2013  ? SCC IN SITU (RIGHT SCALP)(TX AFTER BX)  ? SCC (squamous cell carcinoma) 03/24/2013  ? SCC IN SITU (MID SCALP)(TX AFTER BX)  ? SCC (squamous cell carcinoma) 03/24/2013  ? SCC IN SITU (MID NECK)(TX AFTER BX)  ? Spinal stenosis   ? Squamous cell carcinoma of skin 11/25/1997  ? ant right scalp   ? Tachy-brady syndrome ([place]) 06/03/2020  ? ?Past Surgical History:  ?Procedure Laterality Date  ? addnoid    ? CYSTOSCOPY W/ URETEROSCOPY  1980  ? INSERT / REPLACE / REMOVE PACEMAKER  06/03/2020  ? KNEE SURGERY    ? NAILBED REPAIR Left 06/27/2016  ? Procedure: NAILBED biopsy left thumb;  Surgeon: Daryll Brod, MD;  Location: [place];  Service:  Orthopedics;  Laterality: Left;  FAB  ? PACEMAKER IMPLANT N/A 06/03/2020  ? Procedure: PACEMAKER IMPLANT;  Surgeon: Constance Haw, MD;  Location: [place] [facility];  Service: Cardiovascular;  Laterality: N/A;  ? TONSILLECTOMY    ? TOTAL HIP ARTHROPLASTY Left 10/04/2021  ? Procedure: TOTAL HIP ARTHROPLASTY ANTERIOR APPROACH;  Surgeon: Paralee Cancel, MD;  [facility];  Service: Orthopedics;  Laterality: Left;  ? ? ?No Known Allergies ? ?Outpatient Encounter Medications as of 02/10/2022  ?Medication Sig  ? acetaminophen (TYLENOL) 325 MG tablet Take 650 mg by mouth 3 (three) times daily as needed.  ? acetaminophen (TYLENOL) 325 MG tablet Take 650 mg by mouth daily.  ? amiodarone (PACERONE) 200 MG tablet Take 200 mg by mouth daily.  ? amoxicillin (AMOXIL) 500 MG capsule Take 500 mg by mouth as needed.  ? apixaban (ELIQUIS) 2.5 MG TABS tablet Take 1 tablet (2.5 mg total) by mouth 2 (two) times daily.  ? atorvastatin (LIPITOR) 10 MG tablet Take 10 mg by mouth every evening.   ? cetirizine (ZYRTEC) 10 MG tablet Take 1 tablet (10 mg total) by mouth at bedtime.  ? cyanocobalamin 1000 MCG tablet Take 1,000 mcg by mouth every morning.  ? diltiazem (CARDIZEM CD) 120 MG 24 hr capsule Take 120 mg by mouth daily.  ? docusate sodium (COLACE) 100 MG capsule Take 1 capsule (100 mg total) by mouth 2 (two) times daily.  ? doxycycline (VIBRAMYCIN) 100 MG capsule Take 100 mg by mouth 2 (two) times daily. For bronchitis  ? fluticasone (FLONASE) 50 MCG/ACT nasal spray Place 1 spray into both nostrils daily.  ? furosemide (LASIX) 20 MG tablet Take 20 mg by mouth daily.  ? guaiFENesin (MUCINEX) 600 MG 12 hr tablet Take 600 mg by mouth 2 (two) times daily.  ? ipratropium-albuterol (DUONEB) 0.5-2.5 (3) MG/3ML SOLN Take 3 mLs by nebulization every 8 (eight) hours. For bronchitis  ? magnesium hydroxide (MILK OF MAGNESIA) 400 MG/5ML suspension Take 30 mLs by mouth daily as needed for mild constipation.  ? melatonin 5 MG TABS Take 5 mg  by mouth at bedtime.   ? methocarbamol (ROBAXIN) 500 MG tablet Take 1 tablet (500 mg total) by mouth every 8 (eight) hours as needed for muscle spasms.  ? mirtazapine (REMERON) 15 MG tablet Take 15 mg by mouth at bedtime.  ?  Multiple Vitamins-Minerals (PRESERVISION AREDS 2+MULTI VIT PO) Take 1 capsule by mouth in the morning and at bedtime.  ? omeprazole (PRILOSEC) 20 MG capsule Take 20 mg by mouth daily with breakfast.   ? polyethylene glycol (MIRALAX / GLYCOLAX) 17 g packet Take 17 g by mouth daily as needed.  ? saccharomyces boulardii (FLORASTOR) 250 MG capsule Take 250 mg by mouth 2 (two) times daily.  ? senna-docusate (SENOKOT-S) 8.6-50 MG tablet Take 2 tablets by mouth daily.  ? tamsulosin (FLOMAX) 0.4 MG CAPS capsule Take 0.4 mg by mouth every morning.  ? timolol (TIMOPTIC) 0.5 % ophthalmic solution Place 1 drop into both eyes daily.  ? zinc oxide 20 % ointment Apply 1 application topically as needed for irritation.  ? [DISCONTINUED] predniSONE (DELTASONE) 5 MG tablet Take 5 mg by mouth daily.  ? ?No facility-administered encounter medications on file as of 02/10/2022.  ? ? ?Review of Systems  ?Constitutional:  Positive for fatigue. Negative for appetite change and fever.  ?HENT:  Positive for congestion, hearing loss, rhinorrhea and sore throat. Negative for sinus pressure, sinus pain, trouble swallowing and voice change.   ?     Hearing aids.   ?Eyes:  Negative for visual disturbance.  ?Respiratory:  Positive for cough, shortness of breath and wheezing.   ?Cardiovascular:  Positive for leg swelling. Negative for chest pain and palpitations.  ?Gastrointestinal:  Negative for abdominal pain and constipation.  ?     R+L inguinal hernia. Last BM yesterday 10/09/21  ?Genitourinary:  Positive for difficulty urinating and frequency. Negative for dysuria and urgency.  ?     2-3 x/night, urinary leakage uses adult brief or condom cath  ?Musculoskeletal:  Positive for arthralgias, back pain and gait problem.  ?      Left sciatica pain. Left hip pain.   ?Skin:  Negative for color change.  ?Neurological:  Positive for numbness. Negative for speech difficulty, weakness and headaches.  ?     Feels numb in fingers, but a

## 2022-02-13 ENCOUNTER — Encounter: Payer: Self-pay | Admitting: Nurse Practitioner

## 2022-02-13 DIAGNOSIS — J209 Acute bronchitis, unspecified: Secondary | ICD-10-CM | POA: Insufficient documentation

## 2022-02-13 NOTE — Assessment & Plan Note (Signed)
mild, baselin eHgb 9s,  Normal Vit B12, Fe stores. On B12 ?

## 2022-02-13 NOTE — Assessment & Plan Note (Signed)
Stable,  takes Mirtazapine 

## 2022-02-13 NOTE — Assessment & Plan Note (Signed)
takes Tamsulosin, no change.  ?

## 2022-02-13 NOTE — Assessment & Plan Note (Signed)
Stable,  takes Omeprazole, Hgb 11.5 11/21/21 ?

## 2022-02-13 NOTE — Assessment & Plan Note (Signed)
on Senokot S, Colace, MiraLax ?

## 2022-02-13 NOTE — Assessment & Plan Note (Signed)
Blood pressure is controlled, takes Diltiazem, Furosemide,  Bun/creat 23/1.2 10/05/21 ?

## 2022-02-13 NOTE — Assessment & Plan Note (Signed)
takes Furosemide, 2016 echocardiogram EF 55-60%> Bun/creat 23/1.10 10/05/21 ?

## 2022-02-13 NOTE — Assessment & Plan Note (Signed)
persisted cough, wheezing, yellowish phlegm production. Denied chest pain, no O2 desaturation, he is afebrile. Already on Flonase and Zyrtec.  ?Obtain CXR ap/lateral, CBC/diff, CMP/eGFR, increase Prednisone 73m qd x 5 days then resume baseline 527mqd, start Doxycycline 10034mid x7 days along with Florator bid, DuoNeb q8hr x3 days, Mucinex 600m54md x 5 days. Observe.  ?

## 2022-02-13 NOTE — Assessment & Plan Note (Signed)
11/22 left total hip arthroplasty. Prednisone qd for chronic left sciatica pain. takes Tylenol.  ?

## 2022-02-13 NOTE — Assessment & Plan Note (Signed)
Controlled, , takes Atorvastatin, LDL 75 09/27/20 ?

## 2022-02-13 NOTE — Assessment & Plan Note (Signed)
Bun/creat 26/1.25 10/11/21 ?

## 2022-02-13 NOTE — Assessment & Plan Note (Signed)
Na 134 10/05/21 ?

## 2022-02-13 NOTE — Assessment & Plan Note (Signed)
Pacemaker 07/04/18, Hx of tachy brady syndrome,   f/u [name], interrogation for PM issues.  ?

## 2022-02-13 NOTE — Assessment & Plan Note (Signed)
Heart rate is in control, Eliquis, CHA2DS2-VAS score >3, f/u Dr. [name], on  Amiodarone, Diltiazem ?

## 2022-02-23 ENCOUNTER — Ambulatory Visit: Payer: Medicare Other | Admitting: Dermatology

## 2022-02-23 DIAGNOSIS — Z85828 Personal history of other malignant neoplasm of skin: Secondary | ICD-10-CM

## 2022-02-23 DIAGNOSIS — D485 Neoplasm of uncertain behavior of skin: Secondary | ICD-10-CM

## 2022-02-23 DIAGNOSIS — L821 Other seborrheic keratosis: Secondary | ICD-10-CM | POA: Diagnosis not present

## 2022-02-23 DIAGNOSIS — L820 Inflamed seborrheic keratosis: Secondary | ICD-10-CM | POA: Diagnosis not present

## 2022-02-23 DIAGNOSIS — L570 Actinic keratosis: Secondary | ICD-10-CM | POA: Diagnosis not present

## 2022-02-23 DIAGNOSIS — Z1283 Encounter for screening for malignant neoplasm of skin: Secondary | ICD-10-CM | POA: Diagnosis not present

## 2022-02-23 NOTE — Patient Instructions (Signed)

## 2022-03-02 ENCOUNTER — Ambulatory Visit (INDEPENDENT_AMBULATORY_CARE_PROVIDER_SITE_OTHER): Payer: Medicare Other

## 2022-03-02 DIAGNOSIS — I495 Sick sinus syndrome: Secondary | ICD-10-CM | POA: Diagnosis not present

## 2022-03-02 LAB — CUP PACEART REMOTE DEVICE CHECK
Battery Remaining Longevity: 104 mo
Battery Remaining Percentage: 88 %
Battery Voltage: 3.02 V
Brady Statistic AP VP Percent: 8.3 %
Brady Statistic AP VS Percent: 29 %
Brady Statistic AS VP Percent: 1.6 %
Brady Statistic AS VS Percent: 61 %
Brady Statistic RA Percent Paced: 35 %
Brady Statistic RV Percent Paced: 11 %
Date Time Interrogation Session: 20230420033820
Implantable Lead Implant Date: 20210722
Implantable Lead Implant Date: 20210722
Implantable Lead Location: 753859
Implantable Lead Location: 753860
Implantable Pulse Generator Implant Date: 20210722
Lead Channel Impedance Value: 380 Ohm
Lead Channel Impedance Value: 460 Ohm
Lead Channel Pacing Threshold Amplitude: 0.875 V
Lead Channel Pacing Threshold Amplitude: 1 V
Lead Channel Pacing Threshold Pulse Width: 0.4 ms
Lead Channel Pacing Threshold Pulse Width: 0.4 ms
Lead Channel Sensing Intrinsic Amplitude: 2 mV
Lead Channel Sensing Intrinsic Amplitude: 6.8 mV
Lead Channel Setting Pacing Amplitude: 2 V
Lead Channel Setting Pacing Amplitude: 2.5 V
Lead Channel Setting Pacing Pulse Width: 0.4 ms
Lead Channel Setting Sensing Sensitivity: 2 mV
Pulse Gen Model: 2272
Pulse Gen Serial Number: 3851047

## 2022-03-13 ENCOUNTER — Encounter: Payer: Self-pay | Admitting: Dermatology

## 2022-03-13 NOTE — Progress Notes (Signed)
? ?Follow-Up Visit ?  ?Subjective  ?Stephen Cuevas is a 88 y.o. male who presents for the following: Annual Exam (Here to have patients face and scalp looked at. History of non mole skin cancers. ). ? ?Spots that have changed on right cheek and front scalp, check other spots ?Location:  ?Duration:  ?Quality:  ?Associated Signs/Symptoms: ?Modifying Factors:  ?Severity:  ?Timing: ?Context:  ? ?Objective  ?Well appearing patient in no apparent distress; mood and affect are within normal limits. ?Mid Frontal Scalp ?Eroded 1.1 cm crust ? ? ? ? ? ? ?Right Malar Cheek ?Inflamed 1 cm crust with history of growth and soreness ? ? ? ? ? ? ?Head - Anterior (Face), Scalp ?Patient has literally 100+ seborrheic keratoses face and head (along with many more on torso).  Except for the lesion that was biopsied below the right eye, these are historically stable and not bothersome ? ? ? ?A focused examination was performed including head and neck. Relevant physical exam findings are noted in the Assessment and Plan. ? ? ?Assessment & Plan  ? ? ?Neoplasm of uncertain behavior of skin (2) ?Mid Frontal Scalp ? ?Skin / nail biopsy ?Type of biopsy: tangential   ?Informed consent: discussed and consent obtained   ?Timeout: patient name, date of birth, surgical site, and procedure verified   ?Procedure prep:  Patient was prepped and draped in usual sterile fashion (Non sterile) ?Prep type:  Chlorhexidine ?Anesthesia: the lesion was anesthetized in a standard fashion   ?Anesthetic:  1% lidocaine w/ epinephrine 1-100,000 local infiltration ?Instrument used: flexible razor blade   ?Outcome: patient tolerated procedure well   ?Post-procedure details: wound care instructions given   ? ?Destruction of lesion ?Complexity: simple   ?Destruction method: electrodesiccation and curettage   ?Informed consent: discussed and consent obtained   ?Timeout:  patient name, date of birth, surgical site, and procedure verified ?Anesthesia: the lesion was  anesthetized in a standard fashion   ?Anesthetic:  1% lidocaine w/ epinephrine 1-100,000 local infiltration ?Curettage performed in three different directions: Yes   ?Electrodesiccation performed over the curetted area: Yes   ?Curettage cycles:  3 ?Margin per side (cm):  0.1 ?Final wound size (cm):  1.1 ?Hemostasis achieved with:  ferric subsulfate and electrodesiccation ?Outcome: patient tolerated procedure well with no complications   ?Post-procedure details: sterile dressing applied and wound care instructions given   ?Dressing type: bandage and petrolatum   ? ?Specimen 1 - Surgical pathology ?Differential Diagnosis: R/O BCC vs SCC -treated after biopsy ? ?Check Margins: No ? ?Right Malar Cheek ? ?Skin / nail biopsy ?Type of biopsy: tangential   ?Informed consent: discussed and consent obtained   ?Timeout: patient name, date of birth, surgical site, and procedure verified   ?Procedure prep:  Patient was prepped and draped in usual sterile fashion (Non sterile) ?Prep type:  Chlorhexidine ?Anesthesia: the lesion was anesthetized in a standard fashion   ?Anesthetic:  1% lidocaine w/ epinephrine 1-100,000 local infiltration ?Instrument used: flexible razor blade   ?Hemostasis achieved with: ferric subsulfate and electrodesiccation   ?Outcome: patient tolerated procedure well   ?Post-procedure details: sterile dressing applied and wound care instructions given   ?Dressing type: bandage and petrolatum   ? ?Specimen 2 - Surgical pathology ?Differential Diagnosis: R/O ISK - cautery only ? ?Check Margins: No ? ?After shave biopsy the frontal scalp lesion was treated with curettage and cautery.  The cheek lesion was only shave biopsied. ? ?Seborrheic keratosis (2) ?Head - Anterior (Face); Scalp ? ?  No intervention ? ? ? ? ? ?I, Lavonna Monarch, MD, have reviewed all documentation for this visit.  The documentation on 03/13/22 for the exam, diagnosis, procedures, and orders are all accurate and complete. ?

## 2022-03-20 NOTE — Progress Notes (Signed)
Remote pacemaker transmission.   

## 2022-04-06 ENCOUNTER — Non-Acute Institutional Stay: Payer: Medicare Other | Admitting: Internal Medicine

## 2022-04-06 DIAGNOSIS — I10 Essential (primary) hypertension: Secondary | ICD-10-CM | POA: Diagnosis not present

## 2022-04-06 DIAGNOSIS — Z96642 Presence of left artificial hip joint: Secondary | ICD-10-CM

## 2022-04-06 DIAGNOSIS — F5101 Primary insomnia: Secondary | ICD-10-CM | POA: Diagnosis not present

## 2022-04-06 DIAGNOSIS — I482 Chronic atrial fibrillation, unspecified: Secondary | ICD-10-CM | POA: Diagnosis not present

## 2022-04-06 NOTE — Progress Notes (Signed)
Location: [place] Room Number: 37 Place of Service:  ALF [phone])  Provider: Veleta Miners MD  Code Status: DNR Goals of Care:     02/10/2022    4:31 PM  Advanced Directives  Does Patient Have a Medical Advance Directive? Yes  Type of Advance Directive Out of facility DNR (pink MOST or yellow form);[place];Living will  Does patient want to make changes to medical advance directive? No - Patient declined  Copy of [place] in Chart? Yes - validated most recent copy scanned in chart (See row information)  Pre-existing out of facility DNR order (yellow form or pink MOST form) Yellow form placed in chart (order not valid for inpatient use)     Chief Complaint  Patient presents with   Acute Visit    Insomnia    HPI: Patient is a 88 y.o. male seen today for an acute visit for insomnia   Lives in [place]  Patient has a history of A. fib on Eliquis, hypertension, hyperlipidemia,   right clavicle and right pubic ramus fracture in 10/21 H/o Tachybrady s/p Pacemaker  Also h/o Chronic Pain in Lower Back  ON Chronic Prednisone per previous PCP   Underwent Left Hip Arthroplasty in 11/22   Continues to do well in AL But having issues with insomnia Already on Melatonin and Remeron Also Takes Drink in the evening But say he falls asleep at around 9 but is up at 2 and cant go to sleep Denies any pain    Past Medical History:  Diagnosis Date   Ankle edema    Anxiety    Arthritis    Basal cell carcinoma 11/21/2004   MID FOREHEAD   BCC (basal cell carcinoma of skin) 08/26/2007   (LEFT OUTER, ZYGOMATIC ARCH)(CURET3, EXC)   Dyspnea    Dysrhythmia    a-fib/flutter- on eliquis   GERD (gastroesophageal reflux disease)    no treatment   Hard of hearing    Heart block AV complete ([place]) 07/31/2020   History of kidney stones    Hypertension    Kidney stone    Presence of permanent cardiac pacemaker 06/03/2020   SCC  (squamous cell carcinoma) 07/17/2008   SCC WELL DIFF (RIGHT HELICAL MARGIN)(MOHS)   SCC (squamous cell carcinoma) 03/24/2013   SCC IN SITU (BACK SCALP)(TX AFTER BX)   SCC (squamous cell carcinoma) 03/24/2013   SCC IN SITU (RIGHT SCALP)(TX AFTER BX)   SCC (squamous cell carcinoma) 03/24/2013   SCC IN SITU (MID SCALP)(TX AFTER BX)   SCC (squamous cell carcinoma) 03/24/2013   SCC IN SITU (MID NECK)(TX AFTER BX)   Spinal stenosis    Squamous cell carcinoma of skin 11/25/1997   ant right scalp    Tachy-brady syndrome ([place]) 06/03/2020    Past Surgical History:  Procedure Laterality Date   addnoid     CYSTOSCOPY W/ URETEROSCOPY  1980   INSERT / REPLACE / REMOVE PACEMAKER  06/03/2020   KNEE SURGERY     NAILBED REPAIR Left 06/27/2016   Procedure: NAILBED biopsy left thumb;  Surgeon: Daryll Brod, MD;  Location: [facility];  Service: Orthopedics;  Laterality: Left;  FAB   PACEMAKER IMPLANT N/A 06/03/2020   Procedure: PACEMAKER IMPLANT;  Surgeon: Constance Haw, MD;  Location: [facility];  Service: Cardiovascular;  Laterality: N/A;   TONSILLECTOMY     TOTAL HIP ARTHROPLASTY Left 10/04/2021   Procedure: TOTAL HIP ARTHROPLASTY ANTERIOR APPROACH;  Surgeon:  Paralee Cancel, MD;  Location: [facility];  Service: Orthopedics;  Laterality: Left;    No Known Allergies  Outpatient Encounter Medications as of 04/06/2022  Medication Sig   acetaminophen (TYLENOL) 325 MG tablet Take 650 mg by mouth 3 (three) times daily as needed.   acetaminophen (TYLENOL) 325 MG tablet Take 650 mg by mouth daily.   amiodarone (PACERONE) 200 MG tablet Take 200 mg by mouth daily.   amoxicillin (AMOXIL) 500 MG capsule Take 500 mg by mouth as needed.   apixaban (ELIQUIS) 2.5 MG TABS tablet Take 1 tablet (2.5 mg total) by mouth 2 (two) times daily.   atorvastatin (LIPITOR) 10 MG tablet Take 10 mg by mouth every evening.    cetirizine (ZYRTEC) 10 MG tablet Take 1 tablet (10 mg total) by mouth at bedtime.    cyanocobalamin 1000 MCG tablet Take 1,000 mcg by mouth every morning.   diltiazem (CARDIZEM CD) 120 MG 24 hr capsule Take 120 mg by mouth daily.   docusate sodium (COLACE) 100 MG capsule Take 1 capsule (100 mg total) by mouth 2 (two) times daily.   furosemide (LASIX) 20 MG tablet Take 20 mg by mouth daily.   magnesium hydroxide (MILK OF MAGNESIA) 400 MG/5ML suspension Take 30 mLs by mouth daily as needed for mild constipation.   mirtazapine (REMERON) 15 MG tablet Take 15 mg by mouth at bedtime.   Multiple Vitamins-Minerals (PRESERVISION AREDS 2+MULTI VIT PO) Take 1 capsule by mouth in the morning and at bedtime.   omeprazole (PRILOSEC) 20 MG capsule Take 20 mg by mouth daily with breakfast.    polyethylene glycol (MIRALAX / GLYCOLAX) 17 g packet Take 17 g by mouth daily as needed.   predniSONE (DELTASONE) 5 MG tablet Take 5 mg by mouth daily with breakfast.   senna-docusate (SENOKOT-S) 8.6-50 MG tablet Take 2 tablets by mouth daily.   tamsulosin (FLOMAX) 0.4 MG CAPS capsule Take 0.4 mg by mouth every morning.   timolol (TIMOPTIC) 0.5 % ophthalmic solution Place 1 drop into both eyes daily.   traZODone (DESYREL) 50 MG tablet Take 25 mg by mouth at bedtime.   zinc oxide 20 % ointment Apply 1 application topically as needed for irritation.   [DISCONTINUED] doxycycline (VIBRAMYCIN) 100 MG capsule Take 100 mg by mouth 2 (two) times daily. For bronchitis   [DISCONTINUED] fluticasone (FLONASE) 50 MCG/ACT nasal spray Place 1 spray into both nostrils daily.   [DISCONTINUED] guaiFENesin (MUCINEX) 600 MG 12 hr tablet Take 600 mg by mouth 2 (two) times daily.   [DISCONTINUED] ipratropium-albuterol (DUONEB) 0.5-2.5 (3) MG/3ML SOLN Take 3 mLs by nebulization every 8 (eight) hours. For bronchitis   [DISCONTINUED] melatonin 5 MG TABS Take 5 mg by mouth at bedtime.    [DISCONTINUED] methocarbamol (ROBAXIN) 500 MG tablet Take 1 tablet (500 mg total) by mouth every 8 (eight) hours as needed for muscle spasms.    No facility-administered encounter medications on file as of 04/06/2022.    Review of Systems:  Review of Systems  Constitutional:  Positive for appetite change. Negative for activity change and unexpected weight change.  HENT: Negative.    Respiratory:  Negative for cough and shortness of breath.   Cardiovascular:  Negative for leg swelling.  Gastrointestinal:  Negative for constipation.  Genitourinary:  Negative for frequency.  Musculoskeletal:  Positive for gait problem. Negative for arthralgias and myalgias.  Skin: Negative.  Negative for rash.  Neurological:  Negative for dizziness and weakness.  Psychiatric/Behavioral:  Positive for sleep disturbance. Negative for  confusion and dysphoric mood.   All other systems reviewed and are negative.  Health Maintenance  Topic Date Due   TETANUS/TDAP  Never done   Zoster Vaccines- Shingrix (1 of 2) Never done   Pneumonia Vaccine 58+ Years old (1 - PCV) 02/14/1993   COVID-19 Vaccine (4 - Booster) 09/28/2021   INFLUENZA VACCINE  06/13/2022   HPV VACCINES  Aged Out    Physical Exam: Vitals:   04/07/22 1012  BP: 134/81  Pulse: 60  Resp: 20  Temp: 98.4 F (36.9 C)  SpO2: 97%  Weight: 170 lb 9.6 oz (77.4 kg)  Height: '5\' 6"'$  (1.676 m)   Body mass index is 27.54 kg/m. Physical Exam Vitals reviewed.  Constitutional:      Appearance: Normal appearance.  HENT:     Head: Normocephalic.     Nose: Nose normal.     Mouth/Throat:     Mouth: Mucous membranes are moist.     Pharynx: Oropharynx is clear.  Eyes:     Pupils: Pupils are equal, round, and reactive to light.  Cardiovascular:     Rate and Rhythm: Normal rate and regular rhythm.     Pulses: Normal pulses.     Heart sounds: No murmur heard. Pulmonary:     Effort: Pulmonary effort is normal. No respiratory distress.     Breath sounds: Normal breath sounds. No rales.  Abdominal:     General: Abdomen is flat. Bowel sounds are normal.     Palpations: Abdomen is soft.   Musculoskeletal:        General: No swelling.     Cervical back: Neck supple.  Skin:    General: Skin is warm.  Neurological:     General: No focal deficit present.     Mental Status: He is alert and oriented to person, place, and time.  Psychiatric:        Mood and Affect: Mood normal.        Thought Content: Thought content normal.    Labs reviewed: Basic Metabolic Panel: Recent Labs    09/28/21 1133 10/05/21 0325  NA 137 134*  K 4.3 4.6  CL 104 104  CO2 25 24  GLUCOSE 171* 141*  BUN 21 23  CREATININE 1.27* 1.10  CALCIUM 8.9 8.8*   Liver Function Tests: Recent Labs    09/28/21 1133  AST 26  ALT 25  ALKPHOS 74  BILITOT 0.7  PROT 6.2*  ALBUMIN 3.4*   No results for input(s): LIPASE, AMYLASE in the last 8760 hours. No results for input(s): AMMONIA in the last 8760 hours. CBC: Recent Labs    09/28/21 1133 10/05/21 0325 10/06/21 0334 10/18/21 0000 10/27/21 0000 11/21/21 0000  WBC 12.6* 18.0* 17.8* 10.1 6.8 10.0  NEUTROABS  --   --   --  6,858.00  --  1,450.00  HGB 11.7* 9.8* 9.0* 9.0* 9.1* 11.5*  HCT 37.2* 30.6* 27.7* 28* 28* 35*  MCV 101.9* 101.7* 100.4*  --   --   --   PLT 257 200 170 273 227 220   Lipid Panel: No results for input(s): CHOL, HDL, LDLCALC, TRIG, CHOLHDL, LDLDIRECT in the last 8760 hours. No results found for: HGBA1C  Procedures since last visit: No results found.  Assessment/Plan 1. Primary insomnia Discontinue Melatonin Try Trazadone 25 mg QHS Already on Remeron also 2. S/P total left hip arthroplasty Doing well No Pain Walks with his walker  3. Essential hypertension On cardizem  4. Atrial fibrillation, chronic ([place]) On  Eliquis    Labs/tests ordered:  * No order type specified * Next appt:  Visit date not found

## 2022-04-07 ENCOUNTER — Encounter: Payer: Self-pay | Admitting: Internal Medicine

## 2022-04-22 ENCOUNTER — Encounter: Payer: Self-pay | Admitting: Cardiology

## 2022-05-03 ENCOUNTER — Encounter: Payer: Self-pay | Admitting: Orthopedic Surgery

## 2022-05-03 ENCOUNTER — Non-Acute Institutional Stay: Payer: Medicare Other | Admitting: Orthopedic Surgery

## 2022-05-03 DIAGNOSIS — M5442 Lumbago with sciatica, left side: Secondary | ICD-10-CM | POA: Diagnosis not present

## 2022-05-03 DIAGNOSIS — G8929 Other chronic pain: Secondary | ICD-10-CM

## 2022-05-03 DIAGNOSIS — M7989 Other specified soft tissue disorders: Secondary | ICD-10-CM

## 2022-05-03 DIAGNOSIS — I482 Chronic atrial fibrillation, unspecified: Secondary | ICD-10-CM

## 2022-05-03 DIAGNOSIS — I495 Sick sinus syndrome: Secondary | ICD-10-CM

## 2022-05-03 DIAGNOSIS — Z96642 Presence of left artificial hip joint: Secondary | ICD-10-CM | POA: Diagnosis not present

## 2022-05-03 DIAGNOSIS — I10 Essential (primary) hypertension: Secondary | ICD-10-CM | POA: Diagnosis not present

## 2022-05-03 DIAGNOSIS — F339 Major depressive disorder, recurrent, unspecified: Secondary | ICD-10-CM

## 2022-05-03 DIAGNOSIS — D649 Anemia, unspecified: Secondary | ICD-10-CM

## 2022-05-03 DIAGNOSIS — K219 Gastro-esophageal reflux disease without esophagitis: Secondary | ICD-10-CM

## 2022-05-03 DIAGNOSIS — K5901 Slow transit constipation: Secondary | ICD-10-CM

## 2022-05-03 DIAGNOSIS — F5101 Primary insomnia: Secondary | ICD-10-CM

## 2022-05-03 DIAGNOSIS — R350 Frequency of micturition: Secondary | ICD-10-CM

## 2022-05-03 DIAGNOSIS — N401 Enlarged prostate with lower urinary tract symptoms: Secondary | ICD-10-CM

## 2022-05-03 NOTE — Progress Notes (Signed)
Location:   [facility] Room Number: [place] of Service:  ALF [phone]) Provider:  Yvonna Alanis, NP    Virgie Dad, MD  Patient Care Team: Virgie Dad, MD as PCP - General (Internal Medicine) Lavonna Monarch, MD as Consulting Physician (Dermatology)  Extended Emergency Contact Information Primary Emergency Contact: Michelle Nasuti Address: [address]          [place], [place] [address] Johnnette Litter of [place] Phone: [phone] Work Phone: [phone] Mobile Phone: [phone] Relation: Son Secondary Emergency Contact: Horgan,Chris Mobile Phone: [phone] Relation: Son Preferred language: English  Code Status:  DNR Managed Care Goals of care: Advanced Directive information    05/03/2022   10:32 AM  Advanced Directives  Does Patient Have a Medical Advance Directive? Yes  Type of Advance Directive Out of facility DNR (pink MOST or yellow form);[place];Living will  Does patient want to make changes to medical advance directive? No - Patient declined  Copy of [place] in Chart? Yes - validated most recent copy scanned in chart (See row information)  Pre-existing out of facility DNR order (yellow form or pink MOST form) Yellow form placed in chart (order not valid for inpatient use)     Chief Complaint  Patient presents with   Medical Management of Chronic Issues   Quality Metric Gaps    Verified [facility] and [facility] patient is due for:    Zoster Vaccines- Shingrix (1 of 2)     HPI:  Pt is a 88 y.o. male seen today for medical management of chronic diseases.   He currently resides on the assisted living unit at [facility] PMH: GERD, constipation, HTN, afib, tachy-brady syndrome s/p pacemaker, left hip fracture, CKD, anemia, BPH, insomnia, depression.    Atrial fibrillation- HR controlled with amiodarone and Cardizem, remains on Eliquis for clot prevention HTN- BUN/creat 23/1.10  10/05/2021, remains on furosemide Tachy- brady syndrome- s/p pacemaker 06/04/2020, intermittent dyspnea Chronic back pain- denies increased pain today, remains on low dose prednisone and scheduled tylenol Total left hip arthroplasty- hospitalized 11/22-11/26, ambulates well with walker- no pain, remains on Eliquis for DVT prophylaxis BLE- LVEF 60-65% 02/2020, doppler negative 10/2021, remains on lasix and ted hose daily GERD- hgb 11.5 11/21/2021, remains on omeprazole Insomnia- improved since last month, sleeping from 9 pm to 4 am, off melatonin, remains on Remeron  Depression- no mood changes, remains on Remeron Anemia- Hgb 11.5 11/21/2021 BPH- no retention, remains on Flomax Constipation- LBM 06/21, remains on senna and miralax No recent falls or injuries. Ambulates with walker.   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   History of kidney stones    Hypertension    Kidney stone    Presence of permanent cardiac pacemaker 06/03/2020   SCC (squamous cell carcinoma) 07/17/2008   SCC WELL DIFF (RIGHT HELICAL  MARGIN)(MOHS)   SCC (squamous cell carcinoma) 03/24/2013   SCC IN SITU (BACK SCALP)(TX AFTER BX)   SCC (squamous cell carcinoma) 03/24/2013   SCC IN SITU (RIGHT SCALP)(TX AFTER BX)   SCC (squamous cell carcinoma) 03/24/2013   SCC IN SITU (MID SCALP)(TX AFTER BX)   SCC (squamous cell carcinoma) 03/24/2013   SCC IN SITU (MID NECK)(TX AFTER BX)   Spinal stenosis    Squamous cell  carcinoma of skin 11/25/1997   ant right scalp    Tachy-brady syndrome ([place]) 06/03/2020   Past Surgical History:  Procedure Laterality Date   addnoid     CYSTOSCOPY W/ URETEROSCOPY  1980   INSERT / REPLACE / REMOVE PACEMAKER  06/03/2020   KNEE SURGERY     NAILBED REPAIR Left 06/27/2016   Procedure: NAILBED biopsy left thumb;  Surgeon: Daryll Brod, MD;  Location: [facility];  Service: Orthopedics;  Laterality: Left;  FAB   PACEMAKER IMPLANT N/A 06/03/2020   Procedure: PACEMAKER IMPLANT;  Surgeon: Constance Haw, MD;  Location: [facility];  Service: Cardiovascular;  Laterality: N/A;   TONSILLECTOMY     TOTAL HIP ARTHROPLASTY Left 10/04/2021   Procedure: TOTAL HIP ARTHROPLASTY ANTERIOR APPROACH;  Surgeon: Paralee Cancel, MD;  [facility];  Service: Orthopedics;  Laterality: Left;    No Known Allergies  Allergies as of 05/03/2022   No Known Allergies      Medication List        Accurate as of May 03, 2022 10:33 AM. If you have any questions, ask your nurse or doctor.          acetaminophen 325 MG tablet Commonly known as: TYLENOL Take 650 mg by mouth 3 (three) times daily as needed.   acetaminophen 325 MG tablet Commonly known as: TYLENOL Take 650 mg by mouth daily.   amiodarone 200 MG tablet Commonly known as: PACERONE Take 200 mg by mouth daily.   amoxicillin 500 MG capsule Commonly known as: AMOXIL Take 500 mg by mouth as needed.   apixaban 2.5 MG Tabs tablet Commonly known as: Eliquis Take 1 tablet (2.5 mg total) by mouth 2 (two) times daily.   atorvastatin 10 MG tablet Commonly known as: LIPITOR Take 10 mg by mouth every evening.   cetirizine 10 MG tablet Commonly known as: ZYRTEC Take 1 tablet (10 mg total) by mouth at bedtime.   cyanocobalamin 1000 MCG tablet Take 1,000 mcg by mouth every morning.   diltiazem 120 MG 24 hr capsule Commonly known as: CARDIZEM CD Take 120 mg by mouth daily.   docusate sodium 100 MG  capsule Commonly known as: COLACE Take 1 capsule (100 mg total) by mouth 2 (two) times daily.   furosemide 20 MG tablet Commonly known as: LASIX Take 20 mg by mouth daily.   magnesium hydroxide 400 MG/5ML suspension Commonly known as: MILK OF MAGNESIA Take 30 mLs by mouth daily as needed for mild constipation.   mirtazapine 15 MG tablet Commonly known as: REMERON Take 15 mg by mouth at bedtime.   omeprazole 20 MG capsule Commonly known as: PRILOSEC Take 20 mg by mouth daily with breakfast.   polyethylene glycol 17 g packet Commonly known as: MIRALAX / GLYCOLAX Take 17 g by mouth daily as needed.   predniSONE 5 MG tablet Commonly known as: DELTASONE Take 5 mg by mouth daily with breakfast.   PRESERVISION AREDS 2+MULTI VIT PO Take 1 capsule by mouth in the morning and at bedtime.   senna-docusate  8.6-50 MG tablet Commonly known as: Senokot-S Take 2 tablets by mouth daily.   tamsulosin 0.4 MG Caps capsule Commonly known as: FLOMAX Take 0.4 mg by mouth every morning.   timolol 0.5 % ophthalmic solution Commonly known as: TIMOPTIC Place 1 drop into both eyes daily.   traZODone 50 MG tablet Commonly known as: DESYREL Take 25 mg by mouth at bedtime.   zinc oxide 20 % ointment Apply 1 application topically as needed for irritation.        Review of Systems  Constitutional:  Negative for activity change, appetite change, chills, fatigue and fever.  HENT:  Negative for congestion and trouble swallowing.   Eyes:  Negative for visual disturbance.  Respiratory:  Positive for shortness of breath. Negative for cough and wheezing.   Cardiovascular:  Positive for leg swelling. Negative for chest pain.  Gastrointestinal:  Positive for constipation. Negative for abdominal distention, abdominal pain, diarrhea, nausea and vomiting.  Genitourinary:  Positive for frequency. Negative for dysuria and hematuria.  Musculoskeletal:  Positive for arthralgias and gait problem.  Skin:   Negative for wound.  Neurological:  Positive for weakness. Negative for dizziness and headaches.  Psychiatric/Behavioral:  Positive for dysphoric mood and sleep disturbance. Negative for confusion. The patient is not nervous/anxious.     Immunization History  Administered Date(s) Administered   Influenza, High Dose Seasonal PF 08/07/2016   Influenza-Unspecified 08/04/2020, 09/06/2021   Moderna SARS-COV2 Booster Vaccination 09/09/2020   Moderna Sars-Covid-2 Vaccination 11/18/2019, 12/15/2019   PFIZER(Purple Top)SARS-COV-2 Vaccination 08/03/2021   PNEUMOCOCCAL CONJUGATE-20 02/10/2022   Pfizer Covid-19 Vaccine Bivalent Booster 35yr & up 04/26/2022   Pneumococcal-Unspecified 09/13/2020   Tdap 02/10/2022   Pertinent  Health Maintenance Due  Topic Date Due   INFLUENZA VACCINE  06/13/2022      10/06/2021    7:25 PM 10/07/2021    9:00 AM 10/07/2021    8:35 PM 10/08/2021    7:26 AM 12/23/2021   12:37 PM  Fall Risk  Falls in the past year?     1  Was there an injury with Fall?     1  Fall Risk Category Calculator     3  Fall Risk Category     High  Patient Fall Risk Level High fall risk High fall risk High fall risk High fall risk High fall risk  Patient at Risk for Falls Due to     History of fall(s);Impaired balance/gait;Impaired mobility;Orthopedic patient  Fall risk Follow up     Falls evaluation completed;Education provided;Falls prevention discussed   Functional Status Survey:    Vitals:   05/03/22 1027  BP: 115/60  Pulse: 61  Resp: 20  Temp: 97.8 F (36.6 C)  SpO2: 97%  Weight: 168 lb 12.8 oz (76.6 kg)  Height: '5\' 6"'$  (1.676 m)   Body mass index is 27.25 kg/m. Physical Exam Vitals reviewed.  Constitutional:      General: He is not in acute distress. HENT:     Head: Normocephalic.     Right Ear: There is no impacted cerumen.     Left Ear: There is no impacted cerumen.     Nose: Nose normal.     Mouth/Throat:     Mouth: Mucous membranes are moist.  Eyes:      General:        Right eye: No discharge.        Left eye: No discharge.  Cardiovascular:     Rate and Rhythm: Normal rate. Rhythm irregular.  Pulses: Normal pulses.     Heart sounds: Normal heart sounds.  Pulmonary:     Effort: Pulmonary effort is normal. No respiratory distress.     Breath sounds: Normal breath sounds. No wheezing.  Abdominal:     General: Bowel sounds are normal. There is no distension.     Palpations: Abdomen is soft.     Tenderness: There is no abdominal tenderness.  Musculoskeletal:     Cervical back: Neck supple.     Right lower leg: No edema.     Left lower leg: No edema.  Skin:    General: Skin is warm and dry.     Capillary Refill: Capillary refill takes less than 2 seconds.  Neurological:     General: No focal deficit present.     Mental Status: He is alert and oriented to person, place, and time.     Motor: Weakness present.     Gait: Gait abnormal.     Comments: walker  Psychiatric:        Mood and Affect: Mood normal.        Behavior: Behavior normal.     Labs reviewed: Recent Labs    09/28/21 1133 10/05/21 0325  NA 137 134*  K 4.3 4.6  CL 104 104  CO2 25 24  GLUCOSE 171* 141*  BUN 21 23  CREATININE 1.27* 1.10  CALCIUM 8.9 8.8*   Recent Labs    09/28/21 1133  AST 26  ALT 25  ALKPHOS 74  BILITOT 0.7  PROT 6.2*  ALBUMIN 3.4*   Recent Labs    09/28/21 1133 10/05/21 0325 10/06/21 0334 10/18/21 0000 10/27/21 0000 11/21/21 0000  WBC 12.6* 18.0* 17.8* 10.1 6.8 10.0  NEUTROABS  --   --   --  6,858.00  --  1,450.00  HGB 11.7* 9.8* 9.0* 9.0* 9.1* 11.5*  HCT 37.2* 30.6* 27.7* 28* 28* 35*  MCV 101.9* 101.7* 100.4*  --   --   --   PLT 257 200 170 273 227 220   No results found for: "TSH" No results found for: "HGBA1C" Lab Results  Component Value Date   CHOL 137 09/27/2020   HDL 44 09/27/2020   LDLCALC 75 09/27/2020   TRIG 92 09/27/2020    Significant Diagnostic Results in last 30 days:  No results  found.  Assessment/Plan 1. Atrial fibrillation, chronic ([facility]) - HR controlled with amiodarone and Cardizem - cont Eliquis for clot prevention  2. Essential hypertension - controlled  - cont furosemide - cmp  3. Chronic bilateral low back pain with left-sided sciatica - stable with prednisone and tylenol  4. S/P total left hip arthroplasty - ambulating well with walker  5. Leg swelling - cont furosemide  6. Gastroesophageal reflux disease, unspecified whether esophagitis present - hgb stable - cont omeprazole  7. Primary insomnia - improved from last month - sleeping 9 pm to 4 am now - cont Remeron - off melatonin  8. Recurrent depression ([place]) - no mood changes - likes AL - cont Remeron  9. Anemia, unspecified type - hgb stable - cbc/diff  10. Benign prostatic hyperplasia with urinary frequency - stable with Flomax  11. Slow transit constipation - cont senna and miralax  12. Tachy brady syndrome - s/p pacemaker - intermittent dyspnea a few months ago- BNP inconclusive - consider BNP if dyspnea returns   Family/ staff Communication: plan discussed with patient and nurse  Labs/tests ordered:  cbc/diff, cmp 05/04/2022

## 2022-05-05 ENCOUNTER — Other Ambulatory Visit: Payer: Self-pay | Admitting: Orthopedic Surgery

## 2022-05-05 NOTE — Progress Notes (Signed)
Plan to recheck bmp 05/15/2022

## 2022-06-16 ENCOUNTER — Encounter: Payer: Self-pay | Admitting: General Practice

## 2022-06-16 NOTE — Telephone Encounter (Signed)
error 

## 2022-07-23 ENCOUNTER — Encounter: Payer: Self-pay | Admitting: Cardiology

## 2022-07-27 ENCOUNTER — Non-Acute Institutional Stay: Payer: Medicare Other | Admitting: Internal Medicine

## 2022-07-27 ENCOUNTER — Encounter: Payer: Self-pay | Admitting: Internal Medicine

## 2022-07-27 DIAGNOSIS — F339 Major depressive disorder, recurrent, unspecified: Secondary | ICD-10-CM

## 2022-07-27 DIAGNOSIS — I495 Sick sinus syndrome: Secondary | ICD-10-CM

## 2022-07-27 DIAGNOSIS — R350 Frequency of micturition: Secondary | ICD-10-CM

## 2022-07-27 DIAGNOSIS — D649 Anemia, unspecified: Secondary | ICD-10-CM

## 2022-07-27 DIAGNOSIS — Z96642 Presence of left artificial hip joint: Secondary | ICD-10-CM

## 2022-07-27 DIAGNOSIS — N1831 Chronic kidney disease, stage 3a: Secondary | ICD-10-CM

## 2022-07-27 DIAGNOSIS — K5901 Slow transit constipation: Secondary | ICD-10-CM

## 2022-07-27 DIAGNOSIS — N401 Enlarged prostate with lower urinary tract symptoms: Secondary | ICD-10-CM

## 2022-07-27 DIAGNOSIS — M7989 Other specified soft tissue disorders: Secondary | ICD-10-CM

## 2022-07-27 DIAGNOSIS — I10 Essential (primary) hypertension: Secondary | ICD-10-CM

## 2022-07-27 DIAGNOSIS — M5442 Lumbago with sciatica, left side: Secondary | ICD-10-CM

## 2022-07-27 DIAGNOSIS — G8929 Other chronic pain: Secondary | ICD-10-CM

## 2022-07-27 DIAGNOSIS — I482 Chronic atrial fibrillation, unspecified: Secondary | ICD-10-CM

## 2022-07-27 DIAGNOSIS — F5101 Primary insomnia: Secondary | ICD-10-CM

## 2022-07-27 DIAGNOSIS — K219 Gastro-esophageal reflux disease without esophagitis: Secondary | ICD-10-CM

## 2022-07-27 NOTE — Progress Notes (Signed)
Location:  [facility] Room Number: 37/A Place of Service:  ALF [phone]) Provider:  Virgie Dad, MD  Patient Care Team: Virgie Dad, MD as PCP - General (Internal Medicine) Lavonna Monarch, MD (Inactive) as Consulting Physician (Dermatology)  Extended Emergency Contact Information Primary Emergency Contact: Michelle Nasuti Address: [address]          [place], [place] [address] Johnnette Litter of [place] Phone: [phone] Work Phone: [phone] Mobile Phone: [phone] Relation: Son Secondary Emergency Contact: Hosking,Chris Mobile Phone: [phone] Relation: Son Preferred language: English  Code Status: DNR  Goals of care: Advanced Directive information    07/27/2022   11:40 AM  Advanced Directives  Does Patient Have a Medical Advance Directive? Yes  Type of Paramedic of [place];Living will;Out of facility DNR (pink MOST or yellow form)  Does patient want to make changes to medical advance directive? No - Patient declined  Copy of [place] in Chart? Yes - validated most recent copy scanned in chart (See row information)  Pre-existing out of facility DNR order (yellow form or pink MOST form) Yellow form placed in chart (order not valid for inpatient use)     Chief Complaint  Patient presents with  . Medical Management of Chronic Issues    Routine    HPI:  Pt is a 88 y.o. male seen today for medical management of chronic diseases.    Lives in [place]   Patient has a history of A. fib on Eliquis, hypertension, hyperlipidemia,   right clavicle and right pubic ramus fracture in 10/21 H/o Tachybrady s/p Pacemaker  Also h/o Chronic Pain in Lower Back  ON Chronic Prednisone per previous PCP   Underwent Left Hip Arthroplasty in 11/22   He is stable. No new Nursing issues.  Walks with her walker and also uses Wheelchair No Falls Cognitively continues to do well  Wt Readings from Last 3  Encounters:  07/27/22 174 lb 12.8 oz (79.3 kg)  05/03/22 168 lb 12.8 oz (76.6 kg)  04/07/22 170 lb 9.6 oz (77.4 kg)   Past Medical History:  Diagnosis Date  . Ankle edema   . Anxiety   . Arthritis   . Basal cell carcinoma 11/21/2004   MID FOREHEAD  . BCC (basal cell carcinoma of skin) 08/26/2007   (LEFT OUTER, ZYGOMATIC ARCH)(CURET3, EXC)  . Dyspnea   . Dysrhythmia    a-fib/flutter- on eliquis  . GERD (gastroesophageal reflux disease)    no treatment  . Hard of hearing   . 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 History of kidney stones   . Hypertension   . Kidney stone   . Presence of permanent cardiac pacemaker 06/03/2020  . SCC (squamous cell carcinoma) 07/17/2008   SCC WELL DIFF (RIGHT HELICAL MARGIN)(MOHS)  . SCC (squamous cell carcinoma) 03/24/2013   SCC IN SITU (BACK SCALP)(TX AFTER BX)  . SCC (squamous cell carcinoma) 03/24/2013   SCC IN SITU (RIGHT SCALP)(TX AFTER BX)  . SCC (squamous cell carcinoma) 03/24/2013   SCC IN SITU (MID SCALP)(TX AFTER BX)  . SCC (squamous cell carcinoma) 03/24/2013   SCC IN SITU (MID NECK)(TX AFTER BX)  . Spinal stenosis   . Squamous cell carcinoma of skin 11/25/1997   ant right scalp   . Tachy-brady syndrome ([place]) 06/03/2020   Past Surgical History:  Procedure Laterality Date  . addnoid    . CYSTOSCOPY W/ URETEROSCOPY  1980  . INSERT / REPLACE /  REMOVE PACEMAKER  06/03/2020  . KNEE SURGERY    . NAILBED REPAIR Left 06/27/2016   Procedure: NAILBED biopsy left thumb;  Surgeon: Daryll Brod, MD;  Location: [facility];  Service: Orthopedics;  Laterality: Left;  FAB  . PACEMAKER IMPLANT N/A 06/03/2020   Procedure: PACEMAKER IMPLANT;  Surgeon: Constance Haw, MD;  Location: [facility];  Service: Cardiovascular;  Laterality: N/A;  . TONSILLECTOMY    . TOTAL HIP ARTHROPLASTY Left 10/04/2021   Procedure: TOTAL HIP ARTHROPLASTY ANTERIOR APPROACH;  Surgeon: Paralee Cancel, MD;  Location: [facility];  Service:  Orthopedics;  Laterality: Left;    No Known Allergies  Allergies as of 07/27/2022   No Known Allergies      Medication List        Accurate as of July 27, 2022  3:20 PM. If you have any questions, ask your nurse or doctor.          acetaminophen 325 MG tablet Commonly known as: TYLENOL Take 650 mg by mouth 3 (three) times daily as needed.   acetaminophen 325 MG tablet Commonly known as: TYLENOL Take 650 mg by mouth daily.   amiodarone 200 MG tablet Commonly known as: PACERONE Take 200 mg by mouth daily.   amoxicillin 500 MG capsule Commonly known as: AMOXIL Take 500 mg by mouth as needed.   apixaban 2.5 MG Tabs tablet Commonly known as: Eliquis Take 1 tablet (2.5 mg total) by mouth 2 (two) times daily.   atorvastatin 10 MG tablet Commonly known as: LIPITOR Take 10 mg by mouth daily.   cetirizine 10 MG tablet Commonly known as: ZYRTEC Take 1 tablet (10 mg total) by mouth at bedtime.   cyanocobalamin 1000 MCG tablet Take 1,000 mcg by mouth every morning.   diltiazem 120 MG 24 hr capsule Commonly known as: CARDIZEM CD Take 120 mg by mouth daily.   docusate sodium 100 MG capsule Commonly known as: COLACE Take 1 capsule (100 mg total) by mouth 2 (two) times daily.   furosemide 20 MG tablet Commonly known as: LASIX Take 20 mg by mouth 3 (three) times a week. M/W/F   magnesium hydroxide 400 MG/5ML suspension Commonly known as: MILK OF MAGNESIA Take 30 mLs by mouth daily as needed for mild constipation.   mirtazapine 15 MG tablet Commonly known as: REMERON Take 15 mg by mouth at bedtime.   omeprazole 20 MG capsule Commonly known as: PRILOSEC Take 20 mg by mouth daily with breakfast.   polyethylene glycol 17 g packet Commonly known as: MIRALAX / GLYCOLAX Take 17 g by mouth daily as needed.   predniSONE 5 MG tablet Commonly known as: DELTASONE Take 5 mg by mouth daily with breakfast.   PRESERVISION AREDS 2+MULTI VIT PO Take 1 capsule by  mouth in the morning and at bedtime.   senna-docusate 8.6-50 MG tablet Commonly known as: Senokot-S Take 2 tablets by mouth daily.   tamsulosin 0.4 MG Caps capsule Commonly known as: FLOMAX Take 0.4 mg by mouth every morning.   timolol 0.5 % ophthalmic solution Commonly known as: TIMOPTIC Place 1 drop into both eyes daily.   traZODone 50 MG tablet Commonly known as: DESYREL Take 25 mg by mouth at bedtime.   zinc oxide 20 % ointment Apply 1 application topically as needed for irritation.        Review of Systems  Constitutional:  Negative for activity change, appetite change and unexpected weight change.  HENT: Negative.    Respiratory:  Negative for cough and shortness of breath.   Cardiovascular:  Negative for leg swelling.  Gastrointestinal:  Negative for constipation.  Genitourinary:  Negative for frequency.  Musculoskeletal:  Negative for arthralgias, gait problem and myalgias.  Skin: Negative.  Negative for rash.  Neurological:  Negative for dizziness and weakness.  Psychiatric/Behavioral:  Negative for confusion and sleep disturbance.   All other systems reviewed and are negative.   Immunization History  Administered Date(s) Administered  . Influenza, High Dose Seasonal PF 08/07/2016  . Influenza-Unspecified 08/04/2020, 09/06/2021  . Moderna SARS-COV2 Booster Vaccination 09/09/2020  . Moderna Sars-Covid-2 Vaccination 11/18/2019, 12/15/2019  . PFIZER(Purple Top)SARS-COV-2 Vaccination 08/03/2021  . PNEUMOCOCCAL CONJUGATE-20 02/10/2022  . Pension scheme manager 34yr & up 04/26/2022  . Pneumococcal-Unspecified 09/13/2020  . Tdap 02/10/2022   Pertinent  Health Maintenance Due  Topic Date Due  . INFLUENZA VACCINE  06/13/2022      10/06/2021    7:25 PM 10/07/2021    9:00 AM 10/07/2021    8:35 PM 10/08/2021    7:26 AM 12/23/2021   12:37 PM  Fall Risk  Falls in the past year?     1  Was there an injury with Fall?     1  Fall Risk  Category Calculator     3  Fall Risk Category     High  Patient Fall Risk Level High fall risk High fall risk High fall risk High fall risk High fall risk  Patient at Risk for Falls Due to     History of fall(s);Impaired balance/gait;Impaired mobility;Orthopedic patient  Fall risk Follow up     Falls evaluation completed;Education provided;Falls prevention discussed   Functional Status Survey:    Vitals:   07/27/22 1503  BP: 135/74  Pulse: 62  Resp: 16  Temp: (!) 96.3 F (35.7 C)  SpO2: 94%  Weight: 174 lb 12.8 oz (79.3 kg)  Height: '5\' 6"'$  (1.676 m)   Body mass index is 28.21 kg/m. Physical Exam Vitals reviewed.  Constitutional:      Appearance: Normal appearance.  HENT:     Head: Normocephalic.     Nose: Nose normal.     Mouth/Throat:     Mouth: Mucous membranes are moist.     Pharynx: Oropharynx is clear.  Eyes:     Pupils: Pupils are equal, round, and reactive to light.  Cardiovascular:     Rate and Rhythm: Normal rate and regular rhythm.     Pulses: Normal pulses.     Heart sounds: No murmur heard. Pulmonary:     Effort: Pulmonary effort is normal. No respiratory distress.     Breath sounds: Normal breath sounds. No rales.  Abdominal:     General: Abdomen is flat. Bowel sounds are normal.     Palpations: Abdomen is soft.  Musculoskeletal:        General: Swelling present.     Cervical back: Neck supple.  Skin:    General: Skin is warm.  Neurological:     General: No focal deficit present.     Mental Status: He is alert and oriented to person, place, and time.  Psychiatric:        Mood and Affect: Mood normal.        Thought Content: Thought content normal.    Labs reviewed: Recent Labs    09/28/21 1133 10/05/21 0325  NA 137 134*  K 4.3 4.6  CL 104 104  CO2 25 24  GLUCOSE 171* 141*  BUN 21  23  CREATININE 1.27* 1.10  CALCIUM 8.9 8.8*   Recent Labs    09/28/21 1133  AST 26  ALT 25  ALKPHOS 74  BILITOT 0.7  PROT 6.2*  ALBUMIN 3.4*    Recent Labs    09/28/21 1133 10/05/21 0325 10/06/21 0334 10/18/21 0000 10/27/21 0000 11/21/21 0000  WBC 12.6* 18.0* 17.8* 10.1 6.8 10.0  NEUTROABS  --   --   --  6,858.00  --  1,450.00  HGB 11.7* 9.8* 9.0* 9.0* 9.1* 11.5*  HCT 37.2* 30.6* 27.7* 28* 28* 35*  MCV 101.9* 101.7* 100.4*  --   --   --   PLT 257 200 170 273 227 220   No results found for: "TSH" No results found for: "HGBA1C" Lab Results  Component Value Date   CHOL 137 09/27/2020   HDL 44 09/27/2020   LDLCALC 75 09/27/2020   TRIG 92 09/27/2020    Significant Diagnostic Results in last 30 days:  No results found.  Assessment/Plan There are no diagnoses linked to this encounter.   Family/ staff Communication:   Labs/tests ordered:

## 2022-08-11 ENCOUNTER — Ambulatory Visit: Payer: Medicare Other | Admitting: Student

## 2022-08-11 ENCOUNTER — Telehealth: Payer: Self-pay

## 2022-08-11 ENCOUNTER — Ambulatory Visit: Payer: Medicare Other | Admitting: Cardiology

## 2022-08-11 ENCOUNTER — Encounter: Payer: Self-pay | Admitting: Cardiology

## 2022-08-11 VITALS — BP 119/76 | HR 67 | Temp 98.1°F | Resp 16 | Ht 66.0 in | Wt 179.6 lb

## 2022-08-11 DIAGNOSIS — Z950 Presence of cardiac pacemaker: Secondary | ICD-10-CM

## 2022-08-11 DIAGNOSIS — Z45018 Encounter for adjustment and management of other part of cardiac pacemaker: Secondary | ICD-10-CM

## 2022-08-11 DIAGNOSIS — I495 Sick sinus syndrome: Secondary | ICD-10-CM

## 2022-08-11 DIAGNOSIS — R0609 Other forms of dyspnea: Secondary | ICD-10-CM

## 2022-08-11 DIAGNOSIS — I442 Atrioventricular block, complete: Secondary | ICD-10-CM

## 2022-08-11 NOTE — Telephone Encounter (Signed)
Ext # [phone] for nurses station

## 2022-08-11 NOTE — Telephone Encounter (Signed)
Nursing facility phone number:  [phone] [facility] (Ask for assisted living - Room # [phone]

## 2022-08-11 NOTE — Progress Notes (Signed)
Primary Physician/Referring:  Virgie Dad, MD  Patient ID: Stephen Cuevas, male    DOB: [birth year], 88 y.o.   MRN: [id number]  Chief Complaint  Patient presents with   Pacemaker Check   Hypertension   Atrial Fibrillation   Hyperlipidemia   Follow-up    6 months   HPI:    Stephen Cuevas  is a 88 y.o. Caucasian male who resides at [facility] assisted living facility and has history of hyperlipidemia, hypertension, sick sinus syndrome with paroxysmal atrial fibrillation, complete heart block who underwent permanent pacemaker implantation on 06/04/2020 for marked dyspnea, fatigue and dizziness. He is accompanied by son.  On pacemaker interrogation in December 21, he was found to have frequent episodes of atrial fibrillation with rapid ventricular response, A-fib burden was 21%.  He was started on Eliquis.  He is tolerating this well.  He has not had any bleeding diathesis.  Patient states that when he walks to the dining hall, he does notice mild shortness of breath.  Otherwise remains chest pain-free, he has not had any fall or syncope, states that he is otherwise feeling the best he has.  Patient turned 88 this year.   Past Medical History:  Diagnosis Date   Ankle edema    Anxiety    Arthritis    Basal cell carcinoma 11/21/2004   MID FOREHEAD   BCC (basal cell carcinoma of skin) 08/26/2007   (LEFT OUTER, ZYGOMATIC ARCH)(CURET3, EXC)   Dyspnea    Dysrhythmia    a-fib/flutter- on eliquis   GERD (gastroesophageal reflux disease)    no treatment   Hard of hearing    Heart block AV complete ([place]) 07/31/2020   History of kidney stones    Hypertension    Kidney stone    Presence of permanent cardiac pacemaker 06/03/2020   SCC (squamous cell carcinoma) 07/17/2008   SCC WELL DIFF (RIGHT HELICAL MARGIN)(MOHS)   SCC (squamous cell carcinoma) 03/24/2013   SCC IN SITU (BACK SCALP)(TX AFTER BX)   SCC (squamous cell carcinoma) 03/24/2013   SCC IN SITU (RIGHT  SCALP)(TX AFTER BX)   SCC (squamous cell carcinoma) 03/24/2013   SCC IN SITU (MID SCALP)(TX AFTER BX)   SCC (squamous cell carcinoma) 03/24/2013   SCC IN SITU (MID NECK)(TX AFTER BX)   Spinal stenosis    Squamous cell carcinoma of skin 11/25/1997   ant right scalp    Tachy-brady syndrome ([place]) 06/03/2020   Past Surgical History:  Procedure Laterality Date   addnoid     CYSTOSCOPY W/ URETEROSCOPY  1980   INSERT / REPLACE / REMOVE PACEMAKER  06/03/2020   KNEE SURGERY     NAILBED REPAIR Left 06/27/2016   Procedure: NAILBED biopsy left thumb;  Surgeon: Daryll Brod, MD;  Location: [facility];  Service: Orthopedics;  Laterality: Left;  FAB   PACEMAKER IMPLANT N/A 06/03/2020   Procedure: PACEMAKER IMPLANT;  Surgeon: Constance Haw, MD;  Location: [facility];  Service: Cardiovascular;  Laterality: N/A;   TONSILLECTOMY     TOTAL HIP ARTHROPLASTY Left 10/04/2021   Procedure: TOTAL HIP ARTHROPLASTY ANTERIOR APPROACH;  Surgeon: Paralee Cancel, MD;  Location: [facility];  Service: Orthopedics;  Laterality: Left;   Family History  Problem Relation Age of Onset   Heart failure Father    Heart disease Father     Social History   Tobacco Use   Smoking status: Former    Packs/day: 1.50    Years: 20.00  Total pack years: 30.00    Types: Cigarettes, Pipe, Cigars    Quit date: 03/13/1956    Years since quitting: 66.4   Smokeless tobacco: Never  Substance Use Topics   Alcohol use: Yes    Comment: daily- 2 manhattans   Marital Status: Widowed ROS  Review of Systems  Cardiovascular:  Positive for dyspnea on exertion and leg swelling (MILD AND STABLE). Negative for chest pain, near-syncope and orthopnea.   Objective  Blood pressure 119/76, pulse 67, temperature 98.1 F (36.7 C), temperature source Temporal, resp. rate 16, height '5\' 6"'$  (1.676 m), weight 179 lb 9.6 oz (81.5 kg), SpO2 96 %.     08/11/2022   12:04 PM 07/27/2022    3:03 PM 05/03/2022   10:27 AM  Vitals  with BMI  Height '5\' 6"'$  '5\' 6"'$  '5\' 6"'$   Weight 179 lbs 10 oz 174 lbs 13 oz 168 lbs 13 oz  BMI 29 40.10 27.25  Systolic 366 440 347  Diastolic 76 74 60  Pulse 67 62 61     Physical Exam Neck:     Vascular: No carotid bruit or JVD.  Cardiovascular:     Rate and Rhythm: Normal rate and regular rhythm.     Pulses: Intact distal pulses.          Dorsalis pedis pulses are 2+ on the right side and 2+ on the left side.       Posterior tibial pulses are 1+ on the right side and 1+ on the left side.     Heart sounds: S1 normal and S2 normal. Murmur heard.     Early systolic murmur is present with a grade of 2/6 at the upper right sternal border.     No gallop.  Pulmonary:     Effort: Pulmonary effort is normal.     Breath sounds: Normal breath sounds.     Comments: Pacemaker/ICD site noted  in the left infraclavicular fossa.   Abdominal:     General: Bowel sounds are normal.     Palpations: Abdomen is soft.  Musculoskeletal:     Right lower leg: No edema.     Left lower leg: No edema.    Laboratory examination:      Latest Ref Rng & Units 10/05/2021    3:25 AM 09/28/2021   11:33 AM 10/28/2020   12:00 AM  CMP  Glucose 70 - 99 mg/dL 141  171    BUN 8 - 23 mg/dL '23  21  21      '$ Creatinine 0.61 - 1.24 mg/dL 1.10  1.27  1.1      Sodium 135 - 145 mmol/L 134  137  140      Potassium 3.5 - 5.1 mmol/L 4.6  4.3  3.9      Chloride 98 - 111 mmol/L 104  104  108      CO2 22 - 32 mmol/L '24  25  26      '$ Calcium 8.9 - 10.3 mg/dL 8.8  8.9  8.2      Total Protein 6.5 - 8.1 g/dL  6.2    Total Bilirubin 0.3 - 1.2 mg/dL  0.7    Alkaline Phos 38 - 126 U/L  74  104      AST 15 - 41 U/L  26  15      ALT 0 - 44 U/L  25  13         This result is from an external source.  Latest Ref Rng & Units 11/21/2021   12:00 AM 10/27/2021   12:00 AM 10/18/2021   12:00 AM  CBC  WBC  10.0     6.8     10.1      Hemoglobin 13.5 - 17.5 11.5     9.1     9.0      Hematocrit 41 - 53 35     28     28       Platelets 150 - 400 K/uL 220     227     273         This result is from an external source.   Medications and allergies  No Known Allergies   Current Outpatient Medications:    acetaminophen (TYLENOL) 325 MG tablet, Take 650 mg by mouth daily., Disp: , Rfl:    amiodarone (PACERONE) 200 MG tablet, Take 200 mg by mouth daily., Disp: , Rfl:    apixaban (ELIQUIS) 2.5 MG TABS tablet, Take 1 tablet (2.5 mg total) by mouth 2 (two) times daily., Disp: 180 tablet, Rfl: 1   atorvastatin (LIPITOR) 10 MG tablet, Take 10 mg by mouth daily., Disp: , Rfl: 12   cetirizine (ZYRTEC) 10 MG tablet, Take 1 tablet (10 mg total) by mouth at bedtime., Disp: 30 tablet, Rfl: 3   cyanocobalamin 1000 MCG tablet, Take 1,000 mcg by mouth every morning., Disp: , Rfl:    diltiazem (CARDIZEM CD) 120 MG 24 hr capsule, Take 120 mg by mouth daily., Disp: , Rfl:    docusate sodium (COLACE) 100 MG capsule, Take 1 capsule (100 mg total) by mouth 2 (two) times daily., Disp: 10 capsule, Rfl: 0   furosemide (LASIX) 20 MG tablet, Take 20 mg by mouth 3 (three) times a week. M/W/F, Disp: , Rfl:    magnesium hydroxide (MILK OF MAGNESIA) 400 MG/5ML suspension, Take 30 mLs by mouth daily as needed for mild constipation., Disp: , Rfl:    mirtazapine (REMERON) 15 MG tablet, Take 15 mg by mouth at bedtime., Disp: , Rfl:    Multiple Vitamins-Minerals (PRESERVISION AREDS 2+MULTI VIT PO), Take 1 capsule by mouth in the morning and at bedtime., Disp: , Rfl:    omeprazole (PRILOSEC) 20 MG capsule, Take 20 mg by mouth daily with breakfast. , Disp: , Rfl:    polyethylene glycol (MIRALAX / GLYCOLAX) 17 g packet, Take 17 g by mouth daily as needed., Disp: , Rfl:    predniSONE (DELTASONE) 5 MG tablet, Take 5 mg by mouth daily with breakfast., Disp: , Rfl:    senna-docusate (SENOKOT-S) 8.6-50 MG tablet, Take 2 tablets by mouth daily., Disp: , Rfl:    tamsulosin (FLOMAX) 0.4 MG CAPS capsule, Take 0.4 mg by mouth every morning., Disp: , Rfl:     timolol (TIMOPTIC) 0.5 % ophthalmic solution, Place 1 drop into both eyes daily., Disp: , Rfl:    traZODone (DESYREL) 50 MG tablet, Take 25 mg by mouth at bedtime., Disp: , Rfl:    zinc oxide 20 % ointment, Apply 1 application topically as needed for irritation., Disp: , Rfl:     Lisinopril on hold since 02/16/19 per PCP  Radiology:   Chest x-ray PA and lateral view 02/16/2020: Mild hyperextension, patchy right basilar infiltrate, borderline cardiomegaly, atherosclerosis, thoracic levoscoliosis and spondylosis.  Cardiac Studies:   Echocardiogram 02/26/2020:  Normal LV systolic function with visual EF 60-65%. Left ventricle cavity is normal in size. Mild left ventricular hypertrophy. Normal global wall  motion. Doppler evidence of  grade II diastolic dysfunction. Calculated EF 66%.  Left atrial cavity is severely dilated, 20m/m2.  Right atrial cavity is dilated.  No evidence of aortic stenosis. Mild aortic regurgitation.  Mild to moderate mitral regurgitation.  Moderate tricuspid regurgitation. Moderate pulmonary hypertension. RVSP measures 51 mmHg.  IVC is dilated with a respiratory response of <50%.  No prior study for comparison.  Event monitor 04/27/2020 through 05/17/2020: Patient had paroxysmal episodes of atrial fibrillation with 4% atrial fibrillation burden. Predominant rhythm was sinus rhythm with nonconducted P waves and 1: 1 [place]itz 2 AV  block at 10:21 PM on 05/10/2020. Patient also had a 3.4-second ventricular standstill at 10:35 PM on 05/04/2020  [place] Dual Chamber pacemaker 06/03/2018   Scheduled  In office pacemaker check 08/11/22  Single (S)/Dual (D)/BV: D. Presenting ASVS. Pacemaker dependant:  No. Underlying NSR @ 70/min. AP 38%, VP 13%.  AMS Episodes 0. One AF episode 05/04/2021 for 10 Sec. No further high rate episodes  AT/AF burden 0% .  HVR 0. . Longevity 9 Years. Magnet rate: >85%. Lead measurements: Stable. Histogram: Low  (L)/normal (N)/high (H)  Normal. Patient activity Good.   Observations: Normal pacemaker function. Changes: Turned on rate response.  Compared to prior transmission 10/28/2020, no further atrial fibrillation, previously 21% burden.  No recurrence of atrial fibrillation since 05/04/2021 that was very brief.   EKG  EKG 06/02/2020: Underlying sinus bradycardia at rate of 30 bpm with complete heart block and junctional escape, frequent PACs in bigeminal pattern that appeared to be conducted.  Left axis deviation, left anterior fascicular block.  Right bundle branch block.   Assessment     ICD-10-CM   1. Encounter for care of pacemaker  Z45.018     2. Pacemaker [place] Dual Chamber pacemaker 06/03/2018  Z95.0     3. Heart block AV complete (HCC)  I44.2     4. Tachycardia-bradycardia syndrome ([place]  I49.5     5. Dyspnea on exertion  R06.09       CHA2DS2-VASc Score is 3.  Yearly risk of stroke: 3.2% (A, HTN).  Score of 1=0.6; 2=2.2; 3=3.2; 4=4.8; 5=7.2; 6=9.8; 7=>9.8) -(CHF; HTN; vasc disease DM,  Male = 1; Age <65 =0; 65-74 = 1,  >75 =2; stroke/embolism= 2).    No orders of the defined types were placed in this encounter.   Medications Discontinued During This Encounter  Medication Reason   amoxicillin (AMOXIL) 500 MG capsule     Recommendations:   E RSayvon Cuevas is a 88y.o. Caucasian [facility] and has history of hyperlipidemia, hypertension, sick sinus syndrome with paroxysmal atrial fibrillation, complete heart block who underwent permanent pacemaker implantation on 06/04/2020 for marked dyspnea, fatigue and dizziness. He is accompanied by son.  He is tolerating anticoagulation without bleeding diathesis, hemoglobin has remained stable.  Renal function has remained stable.  He has not had any fall or syncope since pacemaker implantation.  Upon review of the pacemaker today, he has not had any further  episodes of atrial fibrillation for >a year.  However in view of high chads vascular score, continue anticoagulation.  Blood pressure is well controlled.  Today he is not pacemaker dependent.  With regard to dyspnea, I reviewed his pacemaker data, will return on sensor for heart rate response and see whether his symptoms would improve.  I have discussed with the patient and also his son that if he  feels palpitations with activity to inform us, also if he notices improvement in symptoms, we could continue with present changes made on his pacemaker.  Otherwise patient remained stable with no clinical evidence of heart failure, doing well, encouraged him to continue to be physically active.  I will see him back in a year with pacemaker check.  He can certainly call us back sooner if he has any cardiac issues.     Adrian Prows, MD, [facility] 08/11/2022, [place] PM Office: [phone]

## 2022-08-22 ENCOUNTER — Non-Acute Institutional Stay: Payer: Medicare Other | Admitting: Orthopedic Surgery

## 2022-08-22 ENCOUNTER — Encounter: Payer: Self-pay | Admitting: Orthopedic Surgery

## 2022-08-22 DIAGNOSIS — D649 Anemia, unspecified: Secondary | ICD-10-CM

## 2022-08-22 DIAGNOSIS — I495 Sick sinus syndrome: Secondary | ICD-10-CM

## 2022-08-22 DIAGNOSIS — I10 Essential (primary) hypertension: Secondary | ICD-10-CM | POA: Diagnosis not present

## 2022-08-22 DIAGNOSIS — N401 Enlarged prostate with lower urinary tract symptoms: Secondary | ICD-10-CM

## 2022-08-22 DIAGNOSIS — F339 Major depressive disorder, recurrent, unspecified: Secondary | ICD-10-CM

## 2022-08-22 DIAGNOSIS — F5101 Primary insomnia: Secondary | ICD-10-CM

## 2022-08-22 DIAGNOSIS — I482 Chronic atrial fibrillation, unspecified: Secondary | ICD-10-CM | POA: Diagnosis not present

## 2022-08-22 DIAGNOSIS — K219 Gastro-esophageal reflux disease without esophagitis: Secondary | ICD-10-CM

## 2022-08-22 DIAGNOSIS — M5442 Lumbago with sciatica, left side: Secondary | ICD-10-CM

## 2022-08-22 DIAGNOSIS — R351 Nocturia: Secondary | ICD-10-CM

## 2022-08-22 DIAGNOSIS — M7989 Other specified soft tissue disorders: Secondary | ICD-10-CM

## 2022-08-22 DIAGNOSIS — G8929 Other chronic pain: Secondary | ICD-10-CM

## 2022-08-22 NOTE — Progress Notes (Signed)
Location:  [place] Room Number: 37/A Place of Service:  ALF [phone]) Provider:  Yvonna Alanis, NP   Virgie Dad, MD  Patient Care Team: Virgie Dad, MD as PCP - General (Internal Medicine) Lavonna Monarch, MD (Inactive) as Consulting Physician (Dermatology)  Extended Emergency Contact Information Primary Emergency Contact: Michelle Nasuti Address: [address]          [place], [place] [address] Johnnette Litter of [place] Phone: [phone] Work Phone: [phone] Mobile Phone: [phone] Relation: Son Secondary Emergency Contact: Mcilvain,Chris Mobile Phone: [phone] Relation: Son Preferred language: English  Code Status:  DNR Goals of care: Advanced Directive information    07/27/2022   11:40 AM  Advanced Directives  Does Patient Have a Medical Advance Directive? Yes  Type of Paramedic of [place];Living will;Out of facility DNR (pink MOST or yellow form)  Does patient want to make changes to medical advance directive? No - Patient declined  Copy of [place] in Chart? Yes - validated most recent copy scanned in chart (See row information)  Pre-existing out of facility DNR order (yellow form or pink MOST form) Yellow form placed in chart (order not valid for inpatient use)     Chief Complaint  Patient presents with   Acute Visit    Urinary frequency/nocturia    HPI:  Pt is a 88 y.o. male seen today for acute visit due to ongoing urinary frequency/nocturia.   He currently resides on the assisted living unit at [facility]. PMH: GERD, constipation, HTN, afib, tachy-brady syndrome s/p pacemaker, left hip fracture, CKD, anemia, BPH, insomnia, depression.   Urinary frequency/nocturia- h/o BPH, he is on Flomax qam, reports trouble sleeping due to nocturia- waking up 3-4x/night, denies dysuria/fever, does not drink caffeine, does not drink up until bedtime Atrial fibrillation- TSH 2.38  08/07/2022, HR controlled with amiodarone and Cardizem, remains on Eliquis for clot prevention HTN- BUN/creat 18/1.28 08/07/2022, remains on furosemide Tachy- brady syndrome- s/p pacemaker 06/04/2020, intermittent dyspnea Chronic back pain- denies increased pain today, remains on low dose prednisone and scheduled tylenol BLE- LVEF 60-65% 02/2020, doppler negative 10/2021, remains on lasix and ted hose daily GERD- hgb 13.1 08/07/2022, remains on omeprazole Insomnia- improved since last month, sleeping from 9 pm to 4 am, off melatonin, remains on Remeron  Depression- no mood changes, remains on Remeron Anemia- Hgb 13.0 08/07/2022     Past Medical History:  Diagnosis Date   Ankle edema    Anxiety    Arthritis    Basal cell carcinoma 11/21/2004   MID FOREHEAD   BCC (basal cell carcinoma of skin) 08/26/2007   (LEFT OUTER, ZYGOMATIC ARCH)(CURET3, EXC)   Dyspnea    Dysrhythmia    a-fib/flutter- on eliquis   GERD (gastroesophageal reflux disease)    no treatment   Hard of hearing    Heart block AV complete ([place]) 07/31/2020   History of kidney stones    Hypertension    Kidney stone    Presence of permanent cardiac pacemaker 06/03/2020   SCC (squamous cell carcinoma) 07/17/2008   SCC WELL DIFF (RIGHT HELICAL MARGIN)(MOHS)   SCC (squamous cell carcinoma) 03/24/2013   SCC IN SITU (BACK SCALP)(TX AFTER BX)   SCC (squamous cell carcinoma) 03/24/2013   SCC IN SITU (RIGHT SCALP)(TX AFTER BX)   SCC (squamous cell carcinoma) 03/24/2013   SCC IN SITU (MID SCALP)(TX AFTER BX)   SCC (squamous cell carcinoma) 03/24/2013   SCC IN SITU (MID  NECK)(TX AFTER BX)   Spinal stenosis    Squamous cell carcinoma of skin 11/25/1997   ant right scalp    Tachy-brady syndrome ([place]) 06/03/2020   Past Surgical History:  Procedure Laterality Date   addnoid     CYSTOSCOPY W/ URETEROSCOPY  1980   INSERT / REPLACE / REMOVE PACEMAKER  06/03/2020   KNEE SURGERY     NAILBED REPAIR Left 06/27/2016    Procedure: NAILBED biopsy left thumb;  Surgeon: Daryll Brod, MD;  Location: [facility];  Service: Orthopedics;  Laterality: Left;  FAB   PACEMAKER IMPLANT N/A 06/03/2020   Procedure: PACEMAKER IMPLANT;  Surgeon: Constance Haw, MD;  Location: [facility];  Service: Cardiovascular;  Laterality: N/A;   TONSILLECTOMY     TOTAL HIP ARTHROPLASTY Left 10/04/2021   Procedure: TOTAL HIP ARTHROPLASTY ANTERIOR APPROACH;  Surgeon: Paralee Cancel, MD;  Location: [facility];  Service: Orthopedics;  Laterality: Left;    No Known Allergies  Outpatient Encounter Medications as of 08/22/2022  Medication Sig   acetaminophen (TYLENOL) 325 MG tablet Take 650 mg by mouth daily.   amiodarone (PACERONE) 200 MG tablet Take 200 mg by mouth daily.   apixaban (ELIQUIS) 2.5 MG TABS tablet Take 1 tablet (2.5 mg total) by mouth 2 (two) times daily.   atorvastatin (LIPITOR) 10 MG tablet Take 10 mg by mouth daily.   cetirizine (ZYRTEC) 10 MG tablet Take 1 tablet (10 mg total) by mouth at bedtime.   cyanocobalamin 1000 MCG tablet Take 1,000 mcg by mouth every morning.   diltiazem (CARDIZEM CD) 120 MG 24 hr capsule Take 120 mg by mouth daily.   docusate sodium (COLACE) 100 MG capsule Take 1 capsule (100 mg total) by mouth 2 (two) times daily.   furosemide (LASIX) 20 MG tablet Take 20 mg by mouth 3 (three) times a week. M/W/F   magnesium hydroxide (MILK OF MAGNESIA) 400 MG/5ML suspension Take 30 mLs by mouth daily as needed for mild constipation.   mirtazapine (REMERON) 15 MG tablet Take 15 mg by mouth at bedtime.   Multiple Vitamins-Minerals (PRESERVISION AREDS 2+MULTI VIT PO) Take 1 capsule by mouth in the morning and at bedtime.   omeprazole (PRILOSEC) 20 MG capsule Take 20 mg by mouth daily with breakfast.    polyethylene glycol (MIRALAX / GLYCOLAX) 17 g packet Take 17 g by mouth daily as needed.   predniSONE (DELTASONE) 5 MG tablet Take 5 mg by mouth daily with breakfast.   senna-docusate  (SENOKOT-S) 8.6-50 MG tablet Take 2 tablets by mouth daily.   tamsulosin (FLOMAX) 0.4 MG CAPS capsule Take 0.4 mg by mouth every morning.   timolol (TIMOPTIC) 0.5 % ophthalmic solution Place 1 drop into both eyes daily.   traZODone (DESYREL) 50 MG tablet Take 25 mg by mouth at bedtime.   zinc oxide 20 % ointment Apply 1 application topically as needed for irritation.   No facility-administered encounter medications on file as of 08/22/2022.    Review of Systems  Constitutional:  Negative for activity change, appetite change, chills, fatigue and fever.  HENT:  Negative for congestion and trouble swallowing.   Eyes:  Negative for visual disturbance.  Respiratory:  Negative for cough, shortness of breath and wheezing.   Cardiovascular:  Negative for chest pain and leg swelling.  Gastrointestinal:  Negative for abdominal distention, abdominal pain, constipation, diarrhea, nausea and vomiting.  Genitourinary:  Positive for frequency. Negative for dysuria and hematuria.  Musculoskeletal:  Positive for back pain and  gait problem.  Skin:  Negative for wound.  Neurological:  Positive for weakness. Negative for dizziness and headaches.  Psychiatric/Behavioral:  Positive for dysphoric mood and sleep disturbance. Negative for confusion. The patient is not nervous/anxious.     Immunization History  Administered Date(s) Administered   Influenza, High Dose Seasonal PF 08/07/2016   Influenza-Unspecified 08/04/2020, 09/06/2021   Moderna SARS-COV2 Booster Vaccination 09/09/2020   Moderna Sars-Covid-2 Vaccination 11/18/2019, 12/15/2019   PFIZER(Purple Top)SARS-COV-2 Vaccination 08/03/2021   PNEUMOCOCCAL CONJUGATE-20 02/10/2022   Pfizer Covid-19 Vaccine Bivalent Booster 8yr & up 04/26/2022   Pneumococcal-Unspecified 09/13/2020   Tdap 02/10/2022   Pertinent  Health Maintenance Due  Topic Date Due   INFLUENZA VACCINE  06/13/2022      10/06/2021    7:25 PM 10/07/2021    9:00 AM 10/07/2021     8:35 PM 10/08/2021    7:26 AM 12/23/2021   12:37 PM  Fall Risk  Falls in the past year?     1  Was there an injury with Fall?     1  Fall Risk Category Calculator     3  Fall Risk Category     High  Patient Fall Risk Level High fall risk High fall risk High fall risk High fall risk High fall risk  Patient at Risk for Falls Due to     History of fall(s);Impaired balance/gait;Impaired mobility;Orthopedic patient  Fall risk Follow up     Falls evaluation completed;Education provided;Falls prevention discussed   Functional Status Survey:    Vitals:   08/22/22 1345  BP: 130/79  Pulse: 65  Resp: 18  Temp: 98.3 F (36.8 C)  SpO2: 98%  Weight: 178 lb 6.4 oz (80.9 kg)  Height: '5\' 6"'$  (1.676 m)   Body mass index is 28.79 kg/m. Physical Exam Vitals reviewed.  Constitutional:      General: He is not in acute distress. HENT:     Head: Normocephalic.  Eyes:     General:        Right eye: No discharge.        Left eye: No discharge.  Cardiovascular:     Rate and Rhythm: Normal rate. Rhythm irregular.     Pulses: Normal pulses.     Heart sounds: Normal heart sounds.  Pulmonary:     Effort: Pulmonary effort is normal. No respiratory distress.     Breath sounds: Normal breath sounds. No wheezing.  Abdominal:     General: Bowel sounds are normal. There is no distension.     Palpations: Abdomen is soft.     Tenderness: There is no abdominal tenderness.  Musculoskeletal:     Cervical back: Neck supple.     Right lower leg: No edema.     Left lower leg: No edema.  Skin:    General: Skin is warm and dry.     Capillary Refill: Capillary refill takes less than 2 seconds.  Neurological:     General: No focal deficit present.     Mental Status: He is alert and oriented to person, place, and time.     Motor: Weakness present.     Gait: Gait abnormal.     Comments: walker  Psychiatric:        Mood and Affect: Mood normal.        Behavior: Behavior normal.     Labs  reviewed: Recent Labs    09/28/21 1133 10/05/21 0325  NA 137 134*  K 4.3 4.6  CL 104 104  CO2 25 24  GLUCOSE 171* 141*  BUN 21 23  CREATININE 1.27* 1.10  CALCIUM 8.9 8.8*   Recent Labs    09/28/21 1133  AST 26  ALT 25  ALKPHOS 74  BILITOT 0.7  PROT 6.2*  ALBUMIN 3.4*   Recent Labs    09/28/21 1133 10/05/21 0325 10/06/21 0334 10/18/21 0000 10/27/21 0000 11/21/21 0000  WBC 12.6* 18.0* 17.8* 10.1 6.8 10.0  NEUTROABS  --   --   --  6,858.00  --  1,450.00  HGB 11.7* 9.8* 9.0* 9.0* 9.1* 11.5*  HCT 37.2* 30.6* 27.7* 28* 28* 35*  MCV 101.9* 101.7* 100.4*  --   --   --   PLT 257 200 170 273 227 220   No results found for: "TSH" No results found for: "HGBA1C" Lab Results  Component Value Date   CHOL 137 09/27/2020   HDL 44 09/27/2020   LDLCALC 75 09/27/2020   TRIG 92 09/27/2020    Significant Diagnostic Results in last 30 days:  No results found.  Assessment/Plan 1. Nocturia associated with benign prostatic hyperplasia - trouble sleeping dur to frequent urination x 1 month - urinating 3-4x/night - h/o BPH - will increase Flomax to BID  2. Atrial fibrillation, chronic (HCC) - HR controlled with amiodarone and Cardizem - cont Eliquis for clot prevention  3. Essential hypertension - controlled - on furosemide  4. Tachy-brady syndrome ([place]) - s/p pacemaker 05/2020  5. Chronic bilateral low back pain with left-sided sciatica - pain stable with low dose prednisone and tylenol  6. Leg swelling - cont furosemide  7. Gastroesophageal reflux disease, unspecified whether esophagitis present - hgb stable  8. Primary insomnia - cont Remeron and Trazodone  9. Recurrent depression (HCC) - cont Remeron  10. Anemia, unspecified type - hgb stable    Family/ staff Communication: plan discussed with patient and nurse  Labs/tests ordered:  none

## 2022-09-13 ENCOUNTER — Encounter: Payer: Self-pay | Admitting: Orthopedic Surgery

## 2022-09-13 ENCOUNTER — Non-Acute Institutional Stay: Payer: Medicare Other | Admitting: Orthopedic Surgery

## 2022-09-13 DIAGNOSIS — M5442 Lumbago with sciatica, left side: Secondary | ICD-10-CM

## 2022-09-13 DIAGNOSIS — G8929 Other chronic pain: Secondary | ICD-10-CM

## 2022-09-13 DIAGNOSIS — M7989 Other specified soft tissue disorders: Secondary | ICD-10-CM

## 2022-09-13 DIAGNOSIS — F5101 Primary insomnia: Secondary | ICD-10-CM

## 2022-09-13 DIAGNOSIS — I10 Essential (primary) hypertension: Secondary | ICD-10-CM | POA: Diagnosis not present

## 2022-09-13 DIAGNOSIS — D649 Anemia, unspecified: Secondary | ICD-10-CM

## 2022-09-13 DIAGNOSIS — R351 Nocturia: Secondary | ICD-10-CM

## 2022-09-13 DIAGNOSIS — I482 Chronic atrial fibrillation, unspecified: Secondary | ICD-10-CM

## 2022-09-13 DIAGNOSIS — K219 Gastro-esophageal reflux disease without esophagitis: Secondary | ICD-10-CM

## 2022-09-13 DIAGNOSIS — N401 Enlarged prostate with lower urinary tract symptoms: Secondary | ICD-10-CM | POA: Diagnosis not present

## 2022-09-13 DIAGNOSIS — I495 Sick sinus syndrome: Secondary | ICD-10-CM

## 2022-09-13 DIAGNOSIS — F339 Major depressive disorder, recurrent, unspecified: Secondary | ICD-10-CM

## 2022-09-13 NOTE — Progress Notes (Signed)
Location:  [place] Room Number: 37/A Place of Service:  ALF [phone]) Provider:  Yvonna Alanis, NP   Virgie Dad, MD  Patient Care Team: Virgie Dad, MD as PCP - General (Internal Medicine) Lavonna Monarch, MD (Inactive) as Consulting Physician (Dermatology)  Extended Emergency Contact Information Primary Emergency Contact: Michelle Nasuti Address: [address]          [place], [place] [address] Johnnette Litter of [place] Phone: [phone] Work Phone: [phone] Mobile Phone: [phone] Relation: Son Secondary Emergency Contact: Jock,Chris Mobile Phone: [phone] Relation: Son Preferred language: English  Code Status:  DNR Goals of care: Advanced Directive information    07/27/2022   11:40 AM  Advanced Directives  Does Patient Have a Medical Advance Directive? Yes  Type of Paramedic of [place];Living will;Out of facility DNR (pink MOST or yellow form)  Does patient want to make changes to medical advance directive? No - Patient declined  Copy of [place] in Chart? Yes - validated most recent copy scanned in chart (See row information)  Pre-existing out of facility DNR order (yellow form or pink MOST form) Yellow form placed in chart (order not valid for inpatient use)     Chief Complaint  Patient presents with   Acute Visit    Urinary frequency/nocturia    HPI:  Pt is a 88 y.o. male seen today for acute visit due to urinary frequency/nocturia.   He currently resides on the assisted living unit at [facility]. PMH: GERD, constipation, HTN, afib, tachy-brady syndrome s/p pacemaker, left hip fracture, CKD, anemia, BPH, insomnia, depression.   10/10 Flomax increased to BID due to ongoing urinary frequency/nocturia. Symptoms have not improved since medication change. He is able to fall asleep at night, but reports waking up to use bathroom 3-5x/night. He uses a urinal to void. He  reports increased sleepiness in the morning/early afternoon. He also reports urinary frequency during the day time. He has stopped caffeine drinks and limits fluid intake prior to bedtime. Denies fever, flank pain, dysuria, or hematuria. Treatment options discussed, he agrees to see urology.   Atrial fibrillation- TSH 2.38 08/07/2022, HR controlled with amiodarone and Cardizem, remains on Eliquis for clot prevention HTN- BUN/creat 18/1.28 08/07/2022, remains on furosemide Tachy- brady syndrome- s/p pacemaker 06/04/2020, intermittent dyspnea Chronic back pain- denies increased pain today, remains on low dose prednisone and scheduled tylenol BLE- LVEF 60-65% 02/2020, doppler negative 10/2021, remains on lasix 3x/weekly and ted hose daily GERD- hgb 13.1 08/07/2022, remains on omeprazole Insomnia- remains on Remeron and Trazodone Depression- no mood changes, remains on Remeron Anemia- Hgb 13.0 08/07/2022    Past Medical History:  Diagnosis Date   Ankle edema    Anxiety    Arthritis    Basal cell carcinoma 11/21/2004   MID FOREHEAD   BCC (basal cell carcinoma of skin) 08/26/2007   (LEFT OUTER, ZYGOMATIC ARCH)(CURET3, EXC)   Dyspnea    Dysrhythmia    a-fib/flutter- on eliquis   GERD (gastroesophageal reflux disease)    no treatment   Hard of hearing    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/31/2020   History of kidney stones    Hypertension    Kidney stone    Presence of permanent cardiac pacemaker 06/03/2020   SCC (squamous cell carcinoma) 07/17/2008   SCC WELL DIFF (RIGHT HELICAL MARGIN)(MOHS)   SCC (squamous cell carcinoma) 03/24/2013   SCC IN SITU (BACK SCALP)(TX AFTER BX)   SCC (  squamous cell carcinoma) 03/24/2013   SCC IN SITU (RIGHT SCALP)(TX AFTER BX)   SCC (squamous cell carcinoma) 03/24/2013   SCC IN SITU (MID SCALP)(TX AFTER BX)   SCC (squamous cell carcinoma) 03/24/2013   SCC IN SITU (MID NECK)(TX AFTER BX)   Spinal stenosis    Squamous cell carcinoma of skin 11/25/1997    ant right scalp    Tachy-brady syndrome ([place]) 06/03/2020   Past Surgical History:  Procedure Laterality Date   addnoid     CYSTOSCOPY W/ URETEROSCOPY  1980   INSERT / REPLACE / REMOVE PACEMAKER  06/03/2020   KNEE SURGERY     NAILBED REPAIR Left 06/27/2016   Procedure: NAILBED biopsy left thumb;  Surgeon: Daryll Brod, MD;  Location: [facility];  Service: Orthopedics;  Laterality: Left;  FAB   PACEMAKER IMPLANT N/A 06/03/2020   Procedure: PACEMAKER IMPLANT;  Surgeon: Constance Haw, MD;  Location: [facility];  Service: Cardiovascular;  Laterality: N/A;   TONSILLECTOMY     TOTAL HIP ARTHROPLASTY Left 10/04/2021   Procedure: TOTAL HIP ARTHROPLASTY ANTERIOR APPROACH;  Surgeon: Paralee Cancel, MD;  [facility];  Service: Orthopedics;  Laterality: Left;    No Known Allergies  Outpatient Encounter Medications as of 09/13/2022  Medication Sig   acetaminophen (TYLENOL) 325 MG tablet Take 650 mg by mouth daily.   amiodarone (PACERONE) 200 MG tablet Take 200 mg by mouth daily.   apixaban (ELIQUIS) 2.5 MG TABS tablet Take 1 tablet (2.5 mg total) by mouth 2 (two) times daily.   atorvastatin (LIPITOR) 10 MG tablet Take 10 mg by mouth daily.   cetirizine (ZYRTEC) 10 MG tablet Take 1 tablet (10 mg total) by mouth at bedtime.   cyanocobalamin 1000 MCG tablet Take 1,000 mcg by mouth every morning.   diltiazem (CARDIZEM CD) 120 MG 24 hr capsule Take 120 mg by mouth daily.   docusate sodium (COLACE) 100 MG capsule Take 1 capsule (100 mg total) by mouth 2 (two) times daily.   furosemide (LASIX) 20 MG tablet Take 20 mg by mouth 3 (three) times a week. M/W/F   magnesium hydroxide (MILK OF MAGNESIA) 400 MG/5ML suspension Take 30 mLs by mouth daily as needed for mild constipation.   mirtazapine (REMERON) 15 MG tablet Take 15 mg by mouth at bedtime.   Multiple Vitamins-Minerals (PRESERVISION AREDS 2+MULTI VIT PO) Take 1 capsule by mouth in the morning and at bedtime.    omeprazole (PRILOSEC) 20 MG capsule Take 20 mg by mouth daily with breakfast.    polyethylene glycol (MIRALAX / GLYCOLAX) 17 g packet Take 17 g by mouth daily as needed.   predniSONE (DELTASONE) 5 MG tablet Take 5 mg by mouth daily with breakfast.   senna-docusate (SENOKOT-S) 8.6-50 MG tablet Take 2 tablets by mouth daily.   tamsulosin (FLOMAX) 0.4 MG CAPS capsule Take 0.4 mg by mouth in the morning and at bedtime.   timolol (TIMOPTIC) 0.5 % ophthalmic solution Place 1 drop into both eyes daily.   traZODone (DESYREL) 50 MG tablet Take 25 mg by mouth at bedtime.   zinc oxide 20 % ointment Apply 1 application topically as needed for irritation.   No facility-administered encounter medications on file as of 09/13/2022.    Review of Systems  Constitutional:  Negative for activity change, appetite change, chills, fatigue and fever.  HENT:  Negative for congestion and trouble swallowing.   Eyes:  Negative for visual disturbance.  Respiratory:  Negative for cough, shortness of breath  and wheezing.   Cardiovascular:  Positive for leg swelling. Negative for chest pain.  Gastrointestinal:  Positive for constipation. Negative for abdominal distention, abdominal pain, diarrhea, nausea and vomiting.  Genitourinary:  Positive for frequency. Negative for dysuria and hematuria.  Musculoskeletal:  Positive for arthralgias and gait problem.  Skin:  Negative for wound.  Allergic/Immunologic: Positive for environmental allergies.  Neurological:  Positive for weakness. Negative for dizziness and headaches.  Psychiatric/Behavioral:  Positive for dysphoric mood and sleep disturbance. Negative for confusion. The patient is not nervous/anxious.     Immunization History  Administered Date(s) Administered   Influenza, High Dose Seasonal PF 08/07/2016   Influenza-Unspecified 08/04/2020, 09/06/2021   Moderna SARS-COV2 Booster Vaccination 09/09/2020   Moderna Sars-Covid-2 Vaccination 11/18/2019, 12/15/2019    PFIZER(Purple Top)SARS-COV-2 Vaccination 08/03/2021   PNEUMOCOCCAL CONJUGATE-20 02/10/2022   Pfizer Covid-19 Vaccine Bivalent Booster 67yr & up 04/26/2022   Pneumococcal-Unspecified 09/13/2020   Tdap 02/10/2022   Pertinent  Health Maintenance Due  Topic Date Due   INFLUENZA VACCINE  06/13/2022      10/06/2021    7:25 PM 10/07/2021    9:00 AM 10/07/2021    8:35 PM 10/08/2021    7:26 AM 12/23/2021   12:37 PM  Fall Risk  Falls in the past year?     1  Was there an injury with Fall?     1  Fall Risk Category Calculator     3  Fall Risk Category     High  Patient Fall Risk Level High fall risk High fall risk High fall risk High fall risk High fall risk  Patient at Risk for Falls Due to     History of fall(s);Impaired balance/gait;Impaired mobility;Orthopedic patient  Fall risk Follow up     Falls evaluation completed;Education provided;Falls prevention discussed   Functional Status Survey:    Vitals:   09/13/22 1221  BP: 136/79  Pulse: 63  Resp: 18  Temp: 98.4 F (36.9 C)  SpO2: 95%  Weight: 178 lb 6.4 oz (80.9 kg)  Height: '5\' 6"'$  (1.676 m)   Body mass index is 28.79 kg/m. Physical Exam Vitals reviewed.  Constitutional:      General: He is not in acute distress. HENT:     Head: Normocephalic.  Eyes:     General:        Right eye: No discharge.        Left eye: No discharge.  Cardiovascular:     Rate and Rhythm: Normal rate. Rhythm irregular.     Pulses: Normal pulses.     Heart sounds: Normal heart sounds.  Pulmonary:     Effort: Pulmonary effort is normal. No respiratory distress.     Breath sounds: Normal breath sounds. No wheezing.  Abdominal:     General: Bowel sounds are normal. There is no distension.     Palpations: Abdomen is soft.     Tenderness: There is no abdominal tenderness.  Musculoskeletal:     Cervical back: Neck supple.     Right lower leg: Edema present.     Left lower leg: Edema present.     Comments: Non pitting, L>R, ted hose on   Skin:    General: Skin is warm and dry.     Capillary Refill: Capillary refill takes less than 2 seconds.  Neurological:     General: No focal deficit present.     Mental Status: He is alert and oriented to person, place, and time.     Motor: Weakness present.  Gait: Gait abnormal.     Comments: Walker/wheelchair  Psychiatric:        Mood and Affect: Mood normal.        Behavior: Behavior normal.     Labs reviewed: Recent Labs    09/28/21 1133 10/05/21 0325  NA 137 134*  K 4.3 4.6  CL 104 104  CO2 25 24  GLUCOSE 171* 141*  BUN 21 23  CREATININE 1.27* 1.10  CALCIUM 8.9 8.8*   Recent Labs    09/28/21 1133  AST 26  ALT 25  ALKPHOS 74  BILITOT 0.7  PROT 6.2*  ALBUMIN 3.4*   Recent Labs    09/28/21 1133 10/05/21 0325 10/06/21 0334 10/18/21 0000 10/27/21 0000 11/21/21 0000  WBC 12.6* 18.0* 17.8* 10.1 6.8 10.0  NEUTROABS  --   --   --  6,858.00  --  1,450.00  HGB 11.7* 9.8* 9.0* 9.0* 9.1* 11.5*  HCT 37.2* 30.6* 27.7* 28* 28* 35*  MCV 101.9* 101.7* 100.4*  --   --   --   PLT 257 200 170 273 227 220   No results found for: "TSH" No results found for: "HGBA1C" Lab Results  Component Value Date   CHOL 137 09/27/2020   HDL 44 09/27/2020   LDLCALC 75 09/27/2020   TRIG 92 09/27/2020    Significant Diagnostic Results in last 30 days:  No results found.  Assessment/Plan 1. Nocturia associated with benign prostatic hyperplasia - ongoing, urinating 3-5x/night - H/o BPH - 10/10 Flomax increased to BID - no change in symptoms since increase - urinary frequency during daytime as well - urology consult  2. Atrial fibrillation, chronic (HCC) - HR controlled with amiodarone and Cardizem - cont Eliquis for clot prevention  3. Essential hypertension -- controlled - on furosemide  4. Tachy-brady syndrome ([place]) - s/p pacemaker 05/2020  5. Chronic bilateral low back pain with left-sided sciatica - cont prednisone and tylenol  6. Leg swelling - cont  furosemide   7. Gastroesophageal reflux disease, unspecified whether esophagitis present - cont omeprazole  8. Primary insomnia - see above - cont trazodone  9. Recurrent depression ([place]) - no mood changes, very pleasant - cont Remeron  10. Anemia, unspecified type - hgb stable    Family/ staff Communication: plan discussed with patient and nurse  Labs/tests ordered: urology consult

## 2022-10-16 ENCOUNTER — Telehealth: Payer: Self-pay | Admitting: Orthopedic Surgery

## 2022-10-16 NOTE — Telephone Encounter (Signed)
Urology visit with Dr. Diona Fanti reviewed. Advised to reduce Flomax to daily and increase furosemide. Staff will assist with voiding journal. F/u with urology in 6 weeks. Blood pressures BID x 10 days and scheduled bmp 10/23/2022.

## 2022-10-27 ENCOUNTER — Non-Acute Institutional Stay: Payer: Medicare Other | Admitting: Orthopedic Surgery

## 2022-10-27 ENCOUNTER — Encounter: Payer: Self-pay | Admitting: Orthopedic Surgery

## 2022-10-27 DIAGNOSIS — G8929 Other chronic pain: Secondary | ICD-10-CM

## 2022-10-27 DIAGNOSIS — M7989 Other specified soft tissue disorders: Secondary | ICD-10-CM

## 2022-10-27 DIAGNOSIS — I482 Chronic atrial fibrillation, unspecified: Secondary | ICD-10-CM

## 2022-10-27 DIAGNOSIS — I495 Sick sinus syndrome: Secondary | ICD-10-CM | POA: Diagnosis not present

## 2022-10-27 DIAGNOSIS — F5101 Primary insomnia: Secondary | ICD-10-CM

## 2022-10-27 DIAGNOSIS — N401 Enlarged prostate with lower urinary tract symptoms: Secondary | ICD-10-CM | POA: Diagnosis not present

## 2022-10-27 DIAGNOSIS — I10 Essential (primary) hypertension: Secondary | ICD-10-CM | POA: Diagnosis not present

## 2022-10-27 DIAGNOSIS — K219 Gastro-esophageal reflux disease without esophagitis: Secondary | ICD-10-CM

## 2022-10-27 DIAGNOSIS — R351 Nocturia: Secondary | ICD-10-CM

## 2022-10-27 DIAGNOSIS — M5442 Lumbago with sciatica, left side: Secondary | ICD-10-CM

## 2022-10-27 DIAGNOSIS — F339 Major depressive disorder, recurrent, unspecified: Secondary | ICD-10-CM

## 2022-10-27 DIAGNOSIS — D649 Anemia, unspecified: Secondary | ICD-10-CM

## 2022-10-27 NOTE — Progress Notes (Signed)
Location:  [place] Room Number: [place] of Service:  ALF [phone]) Provider:  Windell Moulding, NP   Patient Care Team: Virgie Dad, MD as PCP - General (Internal Medicine) Lavonna Monarch, MD (Inactive) as Consulting Physician (Dermatology)  Extended Emergency Contact Information Primary Emergency Contact: Michelle Nasuti Address: [address]          [place], [place] [address] Johnnette Litter of [place] Phone: [phone] Work Phone: [phone] Mobile Phone: [phone] Relation: Son Secondary Emergency Contact: Akhtar,Chris Mobile Phone: [phone] Relation: Son Preferred language: English  Code Status:  DNR Goals of care: Advanced Directive information    10/27/2022   11:11 AM  Advanced Directives  Does Patient Have a Medical Advance Directive? Yes  Type of Paramedic of [place];Living will;Out of facility DNR (pink MOST or yellow form)  Does patient want to make changes to medical advance directive? No - Patient declined  Copy of [place] in Chart? Yes - validated most recent copy scanned in chart (See row information)     Chief Complaint  Patient presents with   Medical Management of Chronic Issues    Routine Visit with provider on site at [facility].   Quality Metric Gaps    Needs to Discuss Shingrix Vaccine & Medicare Annual Wellness. NCIR Verified.       HPI:  Pt is a 88 y.o. male seen today for medical management of chronic diseases.    He currently resides on the assisted living unit at [facility]. PMH: GERD, constipation, HTN, afib, tachy-brady syndrome s/p pacemaker, left hip fracture s/p LATH 09/2021 , CKD, anemia, BPH, insomnia, depression.    Nocturia/insomnia- recently seen by urology- recommended Flomax daily/ increasing furosemide/ urinary journal, f/u in 6 weeks  Atrial fibrillation- TSH 2.38 08/07/2022, HR controlled with amiodarone and Cardizem, remains on  Eliquis for clot prevention HTN- BUN/creat 18/1.28 08/07/2022, remains on furosemide Tachy- brady syndrome- s/p pacemaker 06/04/2020 Chronic back pain- remains on low dose prednisone and scheduled tylenol BLE- LVEF 60-65% 02/2020, doppler negative 10/2021, remains on furosemide and ted hose daily GERD- hgb 13.1 08/07/2022, remains on omeprazole Insomnia- remains on Remeron and Trazodone Depression- no mood changes, remains on Remeron Anemia- Hgb 13.0 08/07/2022  No recent falls or injuries. Ambulates with walker/wheelchair.   Recent blood pressures:  12/13- 127/74  12/06- 152/80  12/05- 120/60  Recent weights:  12/01- 177 lbs  11/01- 180.6 lbs  10/04- 178.4 lbs       Past Medical History:  Diagnosis Date   Ankle edema    Anxiety    Arthritis    Basal cell carcinoma 11/21/2004   MID FOREHEAD   BCC (basal cell carcinoma of skin) 08/26/2007   (LEFT OUTER, ZYGOMATIC ARCH)(CURET3, EXC)   Dyspnea    Dysrhythmia    a-fib/flutter- on eliquis   GERD (gastroesophageal reflux disease)    no treatment   Hard of hearing    Heart block AV complete ([place]) 07/31/2020   History of kidney stones    Hypertension    Kidney stone    Presence of permanent cardiac pacemaker 06/03/2020   SCC (squamous cell carcinoma) 07/17/2008   SCC WELL DIFF (RIGHT HELICAL MARGIN)(MOHS)   SCC (squamous cell carcinoma) 03/24/2013   SCC IN SITU (BACK SCALP)(TX AFTER BX)   SCC (squamous cell carcinoma) 03/24/2013   SCC IN SITU (RIGHT SCALP)(TX AFTER BX)   SCC (squamous cell carcinoma) 03/24/2013   SCC IN SITU (MID  SCALP)(TX AFTER BX)   SCC (squamous cell carcinoma) 03/24/2013   SCC IN SITU (MID NECK)(TX AFTER BX)   Spinal stenosis    Squamous cell carcinoma of skin 11/25/1997   ant right scalp    Tachy-brady syndrome ([place]) 06/03/2020   Past Surgical History:  Procedure Laterality Date   addnoid     CYSTOSCOPY W/ URETEROSCOPY  1980   INSERT / REPLACE / REMOVE PACEMAKER  06/03/2020   KNEE  SURGERY     NAILBED REPAIR Left 06/27/2016   Procedure: NAILBED biopsy left thumb;  Surgeon: Daryll Brod, MD;  Location: [facility];  Service: Orthopedics;  Laterality: Left;  FAB   PACEMAKER IMPLANT N/A 06/03/2020   Procedure: PACEMAKER IMPLANT;  Surgeon: Constance Haw, MD;  Location: [facility];  Service: Cardiovascular;  Laterality: N/A;   TONSILLECTOMY     TOTAL HIP ARTHROPLASTY Left 10/04/2021   Procedure: TOTAL HIP ARTHROPLASTY ANTERIOR APPROACH;  Surgeon: Paralee Cancel, MD;  Location: [facility];  Service: Orthopedics;  Laterality: Left;    No Known Allergies  Outpatient Encounter Medications as of 10/27/2022  Medication Sig   acetaminophen (TYLENOL) 325 MG tablet Take 650 mg by mouth daily.   amiodarone (PACERONE) 200 MG tablet Take 200 mg by mouth daily.   amoxicillin (AMOXIL) 500 MG tablet Take 2,000 mg by mouth once as needed. Take one hour prior to dental procedures   apixaban (ELIQUIS) 2.5 MG TABS tablet Take 1 tablet (2.5 mg total) by mouth 2 (two) times daily.   atorvastatin (LIPITOR) 10 MG tablet Take 10 mg by mouth daily.   cetirizine (ZYRTEC) 10 MG tablet Take 1 tablet (10 mg total) by mouth at bedtime.   cyanocobalamin 1000 MCG tablet Take 1,000 mcg by mouth every morning.   diltiazem (CARDIZEM CD) 120 MG 24 hr capsule Take 120 mg by mouth daily.   docusate sodium (COLACE) 100 MG capsule Take 1 capsule (100 mg total) by mouth 2 (two) times daily.   furosemide (LASIX) 20 MG tablet Take 20 mg by mouth 3 (three) times a week. M/W/F   magnesium hydroxide (MILK OF MAGNESIA) 400 MG/5ML suspension Take 30 mLs by mouth daily as needed for mild constipation.   mirtazapine (REMERON) 15 MG tablet Take 15 mg by mouth at bedtime.   Multiple Vitamins-Minerals (PRESERVISION AREDS 2+MULTI VIT PO) Take 1 capsule by mouth in the morning and at bedtime.   omeprazole (PRILOSEC) 20 MG capsule Take 20 mg by mouth daily with breakfast.    polyethylene glycol (MIRALAX /  GLYCOLAX) 17 g packet Take 17 g by mouth daily as needed.   predniSONE (DELTASONE) 5 MG tablet Take 5 mg by mouth daily with breakfast.   senna-docusate (SENOKOT-S) 8.6-50 MG tablet Take 2 tablets by mouth daily.   tamsulosin (FLOMAX) 0.4 MG CAPS capsule Take 0.4 mg by mouth in the morning and at bedtime.   timolol (TIMOPTIC) 0.5 % ophthalmic solution Place 1 drop into both eyes daily.   traZODone (DESYREL) 50 MG tablet Take 25 mg by mouth at bedtime.   zinc oxide 20 % ointment Apply 1 application topically as needed for irritation.   No facility-administered encounter medications on file as of 10/27/2022.    Review of Systems  Constitutional:  Negative for activity change, appetite change, fatigue and fever.  HENT:  Negative for congestion and trouble swallowing.   Eyes:  Negative for visual disturbance.  Respiratory:  Negative for cough, shortness of breath and wheezing.   Cardiovascular:  Negative for chest pain and leg swelling.  Gastrointestinal:  Negative for abdominal distention, abdominal pain, constipation, diarrhea, nausea and vomiting.  Genitourinary:  Positive for frequency. Negative for dysuria and hematuria.  Musculoskeletal:  Positive for arthralgias and gait problem.  Skin:  Negative for wound.  Neurological:  Negative for dizziness, weakness and headaches.  Psychiatric/Behavioral:  Positive for sleep disturbance. Negative for agitation, confusion and dysphoric mood. The patient is not nervous/anxious.     Immunization History  Administered Date(s) Administered   Fluad Quad(high Dose 65+) 09/06/2022   Influenza, High Dose Seasonal PF 08/07/2016   Influenza-Unspecified 08/04/2020, 09/06/2021   Moderna SARS-COV2 Booster Vaccination 09/09/2020   Moderna Sars-Covid-2 Vaccination 11/18/2019, 12/15/2019, 09/19/2022   PFIZER(Purple Top)SARS-COV-2 Vaccination 08/03/2021   PNEUMOCOCCAL CONJUGATE-20 02/10/2022   Pfizer Covid-19 Vaccine Bivalent Booster 71yr & up 04/26/2022    Pneumococcal-Unspecified 09/13/2020   Tdap 02/10/2022   Pertinent  Health Maintenance Due  Topic Date Due   INFLUENZA VACCINE  Completed      10/07/2021    9:00 AM 10/07/2021    8:35 PM 10/08/2021    7:26 AM 12/23/2021   12:37 PM 10/27/2022   11:09 AM  Fall Risk  Falls in the past year?    1 0  Was there an injury with Fall?    1 0  Fall Risk Category Calculator    3 0  Fall Risk Category    High Low  Patient Fall Risk Level High fall risk High fall risk High fall risk High fall risk High fall risk  Patient at Risk for Falls Due to    History of fall(s);Impaired balance/gait;Impaired mobility;Orthopedic patient History of fall(s)  Fall risk Follow up    Falls evaluation completed;Education provided;Falls prevention discussed Falls evaluation completed   Functional Status Survey:    Vitals:   10/27/22 1106  BP: 127/74  Pulse: 62  Resp: 19  Temp: (!) 97.3 F (36.3 C)  SpO2: 95%  Weight: 177 lb (80.3 kg)  Height: '5\' 6"'$  (1.676 m)   Body mass index is 28.57 kg/m. Physical Exam Vitals reviewed.  Constitutional:      General: He is not in acute distress. HENT:     Head: Normocephalic.     Right Ear: There is no impacted cerumen.     Left Ear: There is no impacted cerumen.     Nose: Nose normal.     Mouth/Throat:     Mouth: Mucous membranes are moist.  Eyes:     General:        Right eye: No discharge.        Left eye: No discharge.  Cardiovascular:     Rate and Rhythm: Normal rate and regular rhythm.     Pulses: Normal pulses.     Heart sounds: Normal heart sounds.  Pulmonary:     Effort: Pulmonary effort is normal. No respiratory distress.     Breath sounds: Normal breath sounds. No wheezing.  Abdominal:     General: Bowel sounds are normal. There is no distension.     Palpations: Abdomen is soft.     Tenderness: There is no abdominal tenderness.  Musculoskeletal:     Cervical back: Neck supple.     Right lower leg: No edema.     Left lower leg: No edema.   Skin:    General: Skin is warm and dry.     Capillary Refill: Capillary refill takes less than 2 seconds.  Neurological:     General:  No focal deficit present.     Mental Status: He is alert and oriented to person, place, and time.     Motor: Weakness present.     Gait: Gait abnormal.     Comments: Walker/wheelchair  Psychiatric:        Mood and Affect: Mood normal.        Behavior: Behavior normal.     Labs reviewed: No results for input(s): "NA", "K", "CL", "CO2", "GLUCOSE", "BUN", "CREATININE", "CALCIUM", "MG", "PHOS" in the last 8760 hours. No results for input(s): "AST", "ALT", "ALKPHOS", "BILITOT", "PROT", "ALBUMIN" in the last 8760 hours. Recent Labs    11/21/21 0000  WBC 10.0  NEUTROABS 1,450.00  HGB 11.5*  HCT 35*  PLT 220   No results found for: "TSH" No results found for: "HGBA1C" Lab Results  Component Value Date   CHOL 137 09/27/2020   HDL 44 09/27/2020   LDLCALC 75 09/27/2020   TRIG 92 09/27/2020    Significant Diagnostic Results in last 30 days:  No results found.  Assessment/Plan 1. Nocturia associated with benign prostatic hyperplasia - followed by urology - recommended reducing Flomax to once daily - recommended increasing furosemide to once daily - nursing assisting with voiding journal at night - still up 2-3x/every night  2. Atrial fibrillation, chronic (HCC) - HR < 100 - cont amiodarone and cardizem - cont Eliquis for clot prevention  3. Essential hypertension - controlled - cont furosemide  4. Tachy-brady syndrome ([place]) - s/p pacemaker 05/2020  5. Chronic bilateral low back pain with left-sided sciatica - cont prednisone and tylenol   6. Leg swelling - improved with increased furosemide  7. Gastroesophageal reflux disease, unspecified whether esophagitis present - hgb stable - cont omeprazole  8. Primary insomnia - also having nocturia - cont trazodone  9. Recurrent depression (HCC) - cont Remeron  10. Anemia,  unspecified type - hgb stable    Family/ staff Communication: plan discussed with patient and nurse  Labs/tests ordered:  none

## 2022-11-29 ENCOUNTER — Telehealth: Payer: Self-pay

## 2022-11-29 NOTE — Telephone Encounter (Signed)
We do  not need to do anything, all they have to do is leave the transmitter connected and it will do transmissions automatically. Just send an order stating to have transmitter at bedside connected to electrical outlet and not to do anything else. The first time they may have to manually transmit

## 2022-11-29 NOTE — Telephone Encounter (Signed)
Overdue Remote Transmission: 90 days  Spoke with Hinda Kehr (patient's son), he gave me the number to contact [facility] where patient resides. [phone].  Spoke with nurse KD, she would like an order or letter to do the transmission for the patient from here on out. I need to also print her out a schedule on [employer],  do you want for patient to transmit every 90 days or 30 days.   Please advise.   Fax # :[phone]

## 2022-11-29 NOTE — Telephone Encounter (Signed)
The transmitter is right next to his chair on the nightstand and or some reason it is not picking it up. I spoke with son, it has been done, looks like on [employer].

## 2022-12-16 ENCOUNTER — Encounter: Payer: Self-pay | Admitting: Cardiology

## 2023-01-18 ENCOUNTER — Encounter: Payer: Self-pay | Admitting: Internal Medicine

## 2023-01-18 ENCOUNTER — Non-Acute Institutional Stay: Payer: Medicare Other | Admitting: Internal Medicine

## 2023-01-18 DIAGNOSIS — G8929 Other chronic pain: Secondary | ICD-10-CM

## 2023-01-18 DIAGNOSIS — I10 Essential (primary) hypertension: Secondary | ICD-10-CM

## 2023-01-18 DIAGNOSIS — F339 Major depressive disorder, recurrent, unspecified: Secondary | ICD-10-CM

## 2023-01-18 DIAGNOSIS — I482 Chronic atrial fibrillation, unspecified: Secondary | ICD-10-CM | POA: Diagnosis not present

## 2023-01-18 DIAGNOSIS — M5442 Lumbago with sciatica, left side: Secondary | ICD-10-CM | POA: Diagnosis not present

## 2023-01-18 DIAGNOSIS — F5101 Primary insomnia: Secondary | ICD-10-CM

## 2023-01-18 DIAGNOSIS — I495 Sick sinus syndrome: Secondary | ICD-10-CM | POA: Diagnosis not present

## 2023-01-18 DIAGNOSIS — K219 Gastro-esophageal reflux disease without esophagitis: Secondary | ICD-10-CM

## 2023-01-18 NOTE — Progress Notes (Signed)
Location:  [place] Room Number: [place] of Service:  ALF [phone]) Provider:  Virgie Dad, MD   Virgie Dad, MD  Patient Care Team: Virgie Dad, MD as PCP - General (Internal Medicine) Lavonna Monarch, MD (Inactive) as Consulting Physician (Dermatology)  Extended Emergency Contact Information Primary Emergency Contact: Michelle Nasuti Address: [address]          [place], [place] [address] Johnnette Litter of [place] Phone: [phone] Work Phone: [phone] Mobile Phone: [phone] Relation: Son Secondary Emergency Contact: Bauza,Chris Mobile Phone: [phone] Relation: Son Preferred language: English  Code Status:  DNR Goals of care: Advanced Directive information    01/18/2023   10:21 AM  Advanced Directives  Does Patient Have a Medical Advance Directive? Yes  Type of Paramedic of [place];Out of facility DNR (pink MOST or yellow form)  Does patient want to make changes to medical advance directive? No - Patient declined  Copy of [place] in Chart? Yes - validated most recent copy scanned in chart (See row information)     Chief Complaint  Patient presents with   Medical Management of Chronic Issues    Routine Visit    Quality Metric Gaps    Discussed the need for Awv and shingles vaccine     HPI:  Pt is a 89 y.o. male seen today for medical management of chronic diseases.    [place]ives in [place]   Patient has a history of A. fib on Eliquis, hypertension, hyperlipidemia,   right clavicle and right pubic ramus fracture in 10/21 H/o Tachybrady s/p Pacemaker  Also h/o Chronic Pain in Lower Back  ON Chronic Prednisone per previous PCP   Underwent Left Hip Arthroplasty in 11/22  His main Complain is Insomnia  He say he has difficulty falling sleep and stays up most of the nights Takes naps during the daytime Melatonin is not helping  He is stable. No new Nursing issues. 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   History of kidney stones    Hypertension    Kidney stone    Presence of permanent cardiac pacemaker 06/03/2020   SCC (squamous cell carcinoma) 07/17/2008   SCC WELL DIFF (RIGHT HELICAL MARGIN)(MOHS)   SCC (squamous cell carcinoma) 03/24/2013   SCC IN SITU (BACK SCALP)(TX AFTER BX)   SCC (squamous cell carcinoma) 03/24/2013   SCC IN SITU (RIGHT SCALP)(TX AFTER BX)   SCC (squamous cell carcinoma) 03/24/2013   SCC IN SITU (MID SCALP)(TX AFTER BX)   SCC (squamous cell carcinoma) 03/24/2013   SCC IN SITU (MID NECK)(TX AFTER BX)   Spinal stenosis    Squamous cell carcinoma of skin 11/25/1997   ant right scalp    Tachy-brady syndrome (  [place]) 06/03/2020   Past Surgical History:  Procedure Laterality Date   addnoid     CYSTOSCOPY W/ URETEROSCOPY  1980   INSERT / REPLACE / REMOVE PACEMAKER  06/03/2020   KNEE SURGERY     NAILBED REPAIR Left 06/27/2016   Procedure: NAILBED biopsy left thumb;  Surgeon: Daryll Brod, MD;  Location: [facility];  Service: Orthopedics;  Laterality: Left;  FAB   PACEMAKER IMPLANT N/A 06/03/2020   Procedure: PACEMAKER IMPLANT;  Surgeon: Constance Haw, MD;  Location: [facility];  Service: Cardiovascular;  Laterality: N/A;   TONSILLECTOMY     TOTAL HIP ARTHROPLASTY Left 10/04/2021   Procedure: TOTAL HIP ARTHROPLASTY  ANTERIOR APPROACH;  Surgeon: Paralee Cancel, MD;  [facility];  Service: Orthopedics;  Laterality: Left;    No Known Allergies  Outpatient Encounter Medications as of 01/18/2023  Medication Sig   acetaminophen (TYLENOL) 325 MG tablet Take 650 mg by mouth daily.   amiodarone (PACERONE) 200 MG tablet Take 200 mg by mouth daily.   amoxicillin (AMOXIL) 500 MG tablet Take 2,000 mg by mouth once as needed. Take one hour prior to dental procedures   apixaban (ELIQUIS) 2.5 MG TABS tablet Take 1 tablet (2.5 mg total) by mouth 2 (two) times daily.   atorvastatin (LIPITOR) 10 MG tablet Take 10 mg by mouth daily.   cetirizine (ZYRTEC) 10 MG tablet Take 1 tablet (10 mg total) by mouth at bedtime.   cyanocobalamin 1000 MCG tablet Take 1,000 mcg by mouth every morning.   diltiazem (CARDIZEM CD) 120 MG 24 hr capsule Take 120 mg by mouth daily.   docusate sodium (COLACE) 100 MG capsule Take 1 capsule (100 mg total) by mouth 2 (two) times daily.   furosemide (LASIX) 20 MG tablet Take 20 mg by mouth daily. M/W/F   magnesium hydroxide (MILK OF MAGNESIA) 400 MG/5ML suspension Take 30 mLs by mouth daily as needed for mild constipation.   melatonin 5 MG TABS Take 5 mg by mouth.   mirtazapine (REMERON) 15 MG tablet Take 15 mg by mouth at bedtime.   Multiple Vitamins-Minerals (PRESERVISION AREDS 2+MULTI VIT PO) Take 1 capsule by mouth in the morning and at bedtime.   omeprazole (PRILOSEC) 20 MG capsule Take 20 mg by mouth daily with breakfast.    polyethylene glycol (MIRALAX / GLYCOLAX) 17 g packet Take 17 g by mouth daily as needed.   predniSONE (DELTASONE) 5 MG tablet Take 5 mg by mouth daily with breakfast.   senna-docusate (SENOKOT-S) 8.6-50 MG tablet Take 2 tablets by mouth daily.   tamsulosin (FLOMAX) 0.4 MG CAPS capsule Take 0.4 mg by mouth in the morning and at bedtime.   timolol (TIMOPTIC) 0.5 % ophthalmic solution Place 1 drop into both eyes daily.   traZODone (DESYREL) 50 MG tablet Take 25 mg by mouth  at bedtime.   zinc oxide 20 % ointment Apply 1 application topically as needed for irritation.   No facility-administered encounter medications on file as of 01/18/2023.    Review of Systems  Constitutional:  Negative for activity change, appetite change and unexpected weight change.  HENT: Negative.    Respiratory:  Negative for cough and shortness of breath.   Cardiovascular:  Negative for leg swelling.  Gastrointestinal:  Negative for constipation.  Genitourinary:  Positive for frequency.  Musculoskeletal:  Positive for arthralgias and gait problem. Negative for myalgias.  Skin: Negative.  Negative for rash.  Neurological:  Negative for dizziness and weakness.  Psychiatric/Behavioral:  Positive for sleep disturbance. Negative for confusion.   All other systems reviewed and are negative.   Immunization History  Administered Date(s) Administered   Fluad Quad(high Dose 65+) 09/06/2022   Influenza, High Dose Seasonal PF 08/07/2016   Influenza-Unspecified 08/04/2020, 09/06/2021   Moderna SARS-COV2 Booster Vaccination 09/09/2020   Moderna Sars-Covid-2 Vaccination 11/18/2019, 12/15/2019, 09/19/2022   PFIZER(Purple Top)SARS-COV-2 Vaccination 08/03/2021   PNEUMOCOCCAL CONJUGATE-20 02/10/2022   Pfizer Covid-19 Vaccine Bivalent Booster 62yr & up 04/26/2022   Pneumococcal-Unspecified 09/13/2020   Tdap 02/10/2022   Pertinent  Health Maintenance Due  Topic Date Due   INFLUENZA VACCINE  Completed      10/07/2021    9:00 AM 10/07/2021    8:35 PM 10/08/2021    7:26 AM 12/23/2021   12:37 PM 10/27/2022   11:09 AM  Fall Risk  Falls in the past year?    1 0  Was there an injury with Fall?    1 0  Fall Risk Category Calculator    3 0  Fall Risk Category (Retired)    High Low  (RETIRED) Patient Fall Risk Level High fall risk High fall risk High fall risk High fall risk High fall risk  Patient at Risk for Falls Due to    History of fall(s);Impaired balance/gait;Impaired mobility;Orthopedic  patient History of fall(s)  Fall risk Follow up    Falls evaluation completed;Education provided;Falls prevention discussed Falls evaluation completed   Functional Status Survey:    Vitals:   01/18/23 1016  BP: 104/63  Pulse: 62  Resp: 18  Temp: (!) 97.2 F (36.2 C)  TempSrc: Temporal  SpO2: 96%  Weight: 181 lb 3.2 oz (82.2 kg)  Height: '5\' 6"'$  (1.676 m)   Body mass index is 29.25 kg/m. Physical Exam Vitals reviewed.  Constitutional:      Appearance: Normal appearance.  HENT:     Head: Normocephalic.     Nose: Nose normal.     Mouth/Throat:     Mouth: Mucous membranes are moist.     Pharynx: Oropharynx is clear.  Eyes:     Pupils: Pupils are equal, round, and reactive to light.  Cardiovascular:     Rate and Rhythm: Normal rate and regular rhythm.     Pulses: Normal pulses.     Heart sounds: No murmur heard. Pulmonary:     Effort: Pulmonary effort is normal. No respiratory distress.     Breath sounds: Normal breath sounds. No rales.  Abdominal:     General: Abdomen is flat. Bowel sounds are normal.     Palpations: Abdomen is soft.  Musculoskeletal:        General: Swelling present.     Cervical back: Neck supple.  Skin:    General: Skin is warm.  Neurological:     General: No focal deficit present.     Mental Status: He is alert and oriented to person, place, and time.  Psychiatric:        Mood and Affect: Mood normal.        Thought Content: Thought content normal.     Labs reviewed: No results for input(s): "NA", "K", "CL", "CO2", "GLUCOSE", "BUN", "CREATININE", "CALCIUM", "MG", "PHOS" in the last 8760 hours. No results for input(s): "AST", "ALT", "ALKPHOS", "BILITOT", "PROT", "ALBUMIN" in the last 8760 hours. No results for input(s): "WBC", "NEUTROABS", "HGB", "HCT", "MCV", "PLT" in the last 8760 hours. No results found for: "TSH" No results found for: "HGBA1C" Lab Results  Component Value Date   CHOL 137  09/27/2020   HDL 44 09/27/2020   LDLCALC 75  09/27/2020   TRIG 92 09/27/2020    Significant Diagnostic Results in last 30 days:  No results found.  Assessment/Plan 1. Atrial fibrillation, chronic (HCC) Amiodarone and Eliquis Follows with Cardiology Doing well Check TSH  2. Essential hypertension Stable  3. Tachy-brady syndrome ([place]) S/p Pacemaker  4. Chronic bilateral low back pain with left-sided sciatica Has been on Chronic Prednisone and Tylenol  5. Primary insomnia Will change Trazodone to 50 mg QHS  6. Gastroesophageal reflux disease, unspecified whether esophagitis present Omeprazole  7. Recurrent depression ([place]) Remeron 8 HLD on statin 9 BPH On Flomax  Family/ staff Communication:   Labs/tests ordered:  CBC,CMP,Lipid TSH

## 2023-01-19 ENCOUNTER — Non-Acute Institutional Stay (INDEPENDENT_AMBULATORY_CARE_PROVIDER_SITE_OTHER): Payer: Medicare Other | Admitting: Orthopedic Surgery

## 2023-01-19 ENCOUNTER — Encounter: Payer: Self-pay | Admitting: Orthopedic Surgery

## 2023-01-19 DIAGNOSIS — Z0000 Encounter for general adult medical examination without abnormal findings: Secondary | ICD-10-CM

## 2023-01-19 NOTE — Progress Notes (Signed)
Subjective:   Stephen Cuevas is a 89 y.o. male who presents for Medicare Annual/Subsequent preventive examination.  Place of Service: [facility]- assisted living Provider: Windell Moulding, AGNP-C   Review of Systems     Cardiac Risk Factors include: advanced age (>45mn, >>61women);hypertension;male gender;sedentary lifestyle;dyslipidemia     Objective:    Today's Vitals   01/19/23 1105  BP: 104/63  Pulse: 62  Resp: 18  Temp: (!) 97.2 F (36.2 C)  SpO2: 96%  Weight: 181 lb 3.2 oz (82.2 kg)  Height: '5\' 6"'$  (1.676 m)   Body mass index is 29.25 kg/m.     01/19/2023   11:11 AM 01/18/2023   10:21 AM 10/27/2022   11:11 AM 07/27/2022   11:40 AM 05/03/2022   10:32 AM 04/07/2022   10:24 AM 02/10/2022    4:31 PM  Advanced Directives  Does Patient Have a Medical Advance Directive? Yes Yes Yes Yes Yes Yes Yes  Type of [place] DNR (pink MOST or yellow form) H[place] of facility DNR (pink MOST or yellow form) HClevelandLiving will;Out of facility DNR (pink MOST or yellow form) H[place]Living will;Out of facility DNR (pink MOST or yellow form) Out of facility DNR (pink MOST or yellow form);HAtkinson MillsLiving will Out of facility DNR (pink MOST or yellow form);HElkhornLiving will Out of facility DNR (pink MOST or yellow form);HBridgetonLiving will  Does patient want to make changes to medical advance directive? No - Patient declined No - Patient declined No - Patient declined No - Patient declined No - Patient declined No - Patient declined No - Patient declined  Copy of HAnton Ruizin Chart? Yes - validated most recent copy scanned in chart (See row information) Yes - validated most recent copy scanned in chart (See row information) Yes - validated most recent copy scanned in chart (See row  information) Yes - validated most recent copy scanned in chart (See row information) Yes - validated most recent copy scanned in chart (See row information) Yes - validated most recent copy scanned in chart (See row information) Yes - validated most recent copy scanned in chart (See row information)  Pre-existing out of facility DNR order (yellow form or pink MOST form)    Yellow form placed in chart (order not valid for inpatient use) Yellow form placed in chart (order not valid for inpatient use) Yellow form placed in chart (order not valid for inpatient use) Yellow form placed in chart (order not valid for inpatient use)    Current Medications (verified) Outpatient Encounter Medications as of 01/19/2023  Medication Sig   acetaminophen (TYLENOL) 325 MG tablet Take 650 mg by mouth every 8 (eight) hours as needed.   acetaminophen (TYLENOL) 325 MG tablet Take 650 mg by mouth daily.   amiodarone (PACERONE) 200 MG tablet Take 200 mg by mouth daily.   amoxicillin (AMOXIL) 500 MG tablet Take 2,000 mg by mouth once as needed. Take one hour prior to dental procedures   apixaban (ELIQUIS) 2.5 MG TABS tablet Take 1 tablet (2.5 mg total) by mouth 2 (two) times daily.   atorvastatin (LIPITOR) 10 MG tablet Take 10 mg by mouth daily.   cetirizine (ZYRTEC) 10 MG tablet Take 1 tablet (10 mg total) by mouth at bedtime.   cyanocobalamin 1000 MCG tablet Take 1,000 mcg by mouth every morning.   diltiazem (CARDIZEM  CD) 120 MG 24 hr capsule Take 120 mg by mouth daily.   docusate sodium (COLACE) 100 MG capsule Take 1 capsule (100 mg total) by mouth 2 (two) times daily.   furosemide (LASIX) 20 MG tablet Take 20 mg by mouth daily.   magnesium hydroxide (MILK OF MAGNESIA) 400 MG/5ML suspension Take 30 mLs by mouth daily as needed for mild constipation.   melatonin 5 MG TABS Take 5 mg by mouth.   mirtazapine (REMERON) 15 MG tablet Take 15 mg by mouth at bedtime.   Multiple Vitamins-Minerals (PRESERVISION AREDS 2+MULTI VIT  PO) Take 1 capsule by mouth in the morning and at bedtime.   omeprazole (PRILOSEC) 20 MG capsule Take 20 mg by mouth daily with breakfast.    polyethylene glycol (MIRALAX / GLYCOLAX) 17 g packet Take 17 g by mouth daily as needed.   predniSONE (DELTASONE) 5 MG tablet Take 5 mg by mouth daily with breakfast.   senna-docusate (SENOKOT-S) 8.6-50 MG tablet Take 2 tablets by mouth daily.   tamsulosin (FLOMAX) 0.4 MG CAPS capsule Take 0.4 mg by mouth daily.   timolol (TIMOPTIC) 0.5 % ophthalmic solution Place 1 drop into both eyes daily.   traZODone (DESYREL) 50 MG tablet Take 25 mg by mouth at bedtime.   traZODone (DESYREL) 50 MG tablet Take 50 mg by mouth daily.   zinc oxide 20 % ointment Apply 1 application topically as needed for irritation.   No facility-administered encounter medications on file as of 01/19/2023.    Allergies (verified) Patient has no known allergies.   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   SCC (squamous cell carcinoma) 07/17/2008   SCC WELL DIFF (RIGHT HELICAL MARGIN)(MOHS)   SCC (squamous cell carcinoma) 03/24/2013   SCC IN SITU (BACK SCALP)(TX AFTER BX)   SCC (squamous cell carcinoma) 03/24/2013   SCC IN SITU (RIGHT SCALP)(TX AFTER BX)   SCC (squamous cell carcinoma) 03/24/2013   SCC IN SITU (MID SCALP)(TX AFTER BX)   SCC (squamous cell carcinoma) 03/24/2013   SCC IN SITU (MID NECK)(TX AFTER BX)   Spinal stenosis    Squamous cell carcinoma of skin 11/25/1997   ant right scalp     Tachy-brady syndrome ([place]) 06/03/2020   Past Surgical History:  Procedure Laterality Date   addnoid     CYSTOSCOPY W/ URETEROSCOPY  1980   INSERT / REPLACE / REMOVE PACEMAKER  06/03/2020   KNEE SURGERY     NAILBED REPAIR Left 06/27/2016   Procedure: NAILBED biopsy left thumb;  Surgeon: Daryll Brod, MD;  Location: [facility];  Service: Orthopedics;  Laterality: Left;  FAB   PACEMAKER IMPLANT N/A 06/03/2020   Procedure: PACEMAKER IMPLANT;  Surgeon: Constance Haw, MD;  Location: [facility];  Service: Cardiovascular;  Laterality: N/A;   TONSILLECTOMY     TOTAL HIP ARTHROPLASTY Left 10/04/2021   Procedure: TOTAL HIP ARTHROPLASTY ANTERIOR APPROACH;  Surgeon: Paralee Cancel, MD;  Location: [facility];  Service: Orthopedics;  Laterality: Left;   Family History  Problem Relation Age of Onset   Heart failure Father    Heart disease Father    Social History   Socioeconomic History   Marital status: Widowed    Spouse name: Not on file   Number of children: 4   Years of education: Not on file   Highest education level: Not on file  Occupational History   Occupation: retired  Tobacco Use   Smoking status: Former    Packs/day: 1.50    Years: 20.00    Total pack years: 30.00    Types: Cigarettes, Pipe, Cigars    Quit date: 03/13/1956    Years since quitting: 66.8   Smokeless tobacco: Never  Vaping Use   Vaping Use: Never used  Substance and Sexual Activity   Alcohol use: Yes    Comment: daily- 2 manhattans   Drug use: Never   Sexual activity: Not on file  Other Topics Concern   Not on file  Social History Narrative   Not on file   Social Determinants of Health   Financial Resource Strain: [place]  (01/19/2023)   Overall Financial Resource Strain (CARDIA)    Difficulty of Paying Living Expenses: Not hard at all  Food Insecurity: No Food Insecurity (01/19/2023)   Hunger Vital Sign    Worried About Running Out of Food in the Last Year: Never true    [place] in the Last Year: Never true  Transportation Needs: No Transportation Needs (01/19/2023)   PRAPARE - Hydrologist (Medical): No    Lack of Transportation (Non-Medical): No  Physical Activity: Insufficiently Active (01/19/2023)   Exercise Vital Sign    Days of Exercise per Week: 5 days    Minutes of Exercise per Session: 10 min  Stress: No Stress Concern Present (01/19/2023)   [place]    Feeling of Stress : Not at all  Social Connections: Moderately Integrated (01/19/2023)   Social Connection and Isolation Panel [NHANES]    Frequency of Communication with Friends and Family: Three times a week    Frequency of Social Gatherings with Friends and Family: Once a week    Attends Religious Services: 1 to 4 times per year    Active Member of [employer] or Organizations: Yes    Attends Archivist Meetings: 1 to 4 times per year    Marital Status: Widowed    Tobacco Counseling Counseling given: Not Answered   Clinical Intake:  Pre-visit preparation completed: No  Pain : No/denies pain     BMI - recorded: 29.25 Nutritional Status: BMI 25 -29 Overweight Nutritional Risks: None Diabetes: No  How often do you need to have someone help you when you read instructions, pamphlets, or other written materials from your doctor or pharmacy?: 2 - Rarely What is the last grade level you completed in school?: college 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 1  Vision? 0  Difficulty concentrating or making decisions? 0  Walking or climbing stairs? 1  Dressing or bathing? 1  Doing errands, shopping? 1  Preparing Food and eating ? Y  Using  the Toilet? N  In the past six months, have you accidently leaked  urine? Y  Do you have problems with loss of bowel control? N  Managing your Medications? Y  Managing your Finances? Y  Housekeeping or managing your Housekeeping? Y    Patient Care Team: Virgie Dad, MD as PCP - General (Internal Medicine) Lavonna Monarch, MD (Inactive) as Consulting Physician (Dermatology)  Indicate any recent Medical Services you may have received from other than [facility] providers in the past year (date may be approximate).     Assessment:   This is a routine wellness examination for Stephen Cuevas.  Hearing/Vision screen No results found.  Dietary issues and exercise activities discussed: Current Exercise Habits: The patient does not participate in regular exercise at present, Exercise limited by: cardiac condition(s);orthopedic condition(s)   Goals Addressed             This Visit's Progress    Maintain Mobility and Function   On track    Evidence-based guidance:  Acknowledge and validate impact of pain, loss of strength and potential disfigurement (hand osteoarthritis) on mental health and daily life, such as social isolation, anxiety, depression, impaired sexual relationship and   injury from falls.  Anticipate referral to physical or occupational therapy for assessment, therapeutic exercise and recommendation for adaptive equipment or assistive devices; encourage participation.  Assess impact on ability to perform activities of daily living, as well as engage in sports and leisure events or requirements of work or school.  Provide anticipatory guidance and reassurance about the benefit of exercise to maintain function; acknowledge and normalize fear that exercise may worsen symptoms.  Encourage regular exercise, at least 10 minutes at a time for 45 minutes per week; consider yoga, water exercise and proprioceptive exercises; encourage use of wearable activity tracker to increase motivation and adherence.  Encourage maintenance or resumption of daily activities,  including employment, as pain allows and with minimal exposure to trauma.  Assist patient to advocate for adaptations to the work environment.  Consider level of pain and function, gender, age, lifestyle, patient preference, quality of life, readiness and ?ocapacity to benefit? when recommending patients for orthopaedic surgery consultation.  Explore strategies, such as changes to medication regimen or activity that enables patient to anticipate and manage flare-ups that increase deconditioning and disability.  Explore patient preferences; encourage exposure to a broader range of activities that have been avoided for fear of experiencing pain.  Identify barriers to participation in therapy or exercise, such as pain with activity, anticipated or imagined pain.  Monitor postoperative joint replacement or any preexisting joint replacement for ongoing pain and loss of function; provide social support and encouragement throughout recovery.   Notes:        Depression Screen    01/19/2023    2:14 PM 10/27/2022   11:09 AM 12/23/2021   12:34 PM  PHQ 2/9 Scores  PHQ - 2 Score 0 0 0    Fall Risk    01/19/2023    2:15 PM 10/27/2022   11:09 AM 12/23/2021   12:37 PM  [place] in the past year? 1 0 1  Number falls in past yr: 0 0 1  Injury with Fall? 0 0 1  Risk for fall due to : History of fall(s);Impaired mobility History of fall(s) History of fall(s);Impaired balance/gait;Impaired mobility;Orthopedic patient  Follow up Falls evaluation completed;Falls prevention discussed;Education provided Falls evaluation completed Falls evaluation completed;Education provided;Falls prevention discussed    FALL RISK PREVENTION PERTAINING  TO THE HOME:  Any stairs in or around the home? No  If so, are there any without handrails? No  Home free of loose throw rugs in walkways, pet beds, electrical cords, etc? Yes  Adequate lighting in your home to reduce risk of falls? Yes   ASSISTIVE DEVICES  UTILIZED TO PREVENT FALLS:  Life alert? No  Use of a cane, walker or w/c? Yes  Grab bars in the bathroom? Yes  Shower chair or bench in shower? Yes  Elevated toilet seat or a handicapped toilet? Yes   TIMED UP AND GO:  Was the test performed? No .  Length of time to ambulate 10 feet: N/A sec.   Gait slow and steady with assistive device  Cognitive Function:    01/19/2023    2:16 PM 12/23/2021   12:38 PM  MMSE - Mini Mental State Exam  Orientation to time 5 5  Orientation to Place 5 5  Registration 3 3  Attention/ Calculation 5 4  Recall 3 3  Language- name 2 objects 2 2  Language- repeat 1 1  Language- follow 3 step command 3 3  Language- read & follow direction 1 1  Write a sentence 1 1  Copy design 1 1  Total score 30 29        12/23/2021   12:39 PM  6CIT Screen  What Year? 0 points  What month? 0 points  What time? 0 points  Count back from 20 0 points  Months in reverse 2 points  Repeat phrase 0 points  Total Score 2 points    Immunizations Immunization History  Administered Date(s) Administered   Fluad Quad(high Dose 65+) 09/06/2022   Influenza, High Dose Seasonal PF 08/07/2016   Influenza-Unspecified 08/04/2020, 09/06/2021   Moderna SARS-COV2 Booster Vaccination 09/09/2020   Moderna Sars-Covid-2 Vaccination 11/18/2019, 12/15/2019, 09/19/2022   PFIZER(Purple Top)SARS-COV-2 Vaccination 08/03/2021   PNEUMOCOCCAL CONJUGATE-20 02/10/2022   Pfizer Covid-19 Vaccine Bivalent Booster 44yr & up 04/26/2022   Pneumococcal-Unspecified 09/13/2020   Tdap 02/10/2022    TDAP status: Up to date  Flu Vaccine status: Up to date  Pneumococcal vaccine status: Up to date  Covid-19 vaccine status: Completed vaccines  Qualifies for Shingles Vaccine? Yes   Zostavax completed Yes   Shingrix Completed?: No.    Education has been provided regarding the importance of this vaccine. Patient has been advised to call insurance company to determine out of pocket expense if  they have not yet received this vaccine. Advised may also receive vaccine at local pharmacy or Health Dept. Verbalized acceptance and understanding.  Screening Tests Health Maintenance  Topic Date Due   COVID-19 Vaccine (6 - 2023-24 season) 02/03/2023 (Originally 11/14/2022)   Zoster Vaccines- Shingrix (1 of 2) 04/21/2023 (Originally 02/15/1945)   Medicare Annual Wellness (AWV)  01/19/2024   DTaP/Tdap/Td (2 - Td or Tdap) 02/11/2032   Pneumonia Vaccine 89 Years old  Completed   INFLUENZA VACCINE  Completed   HPV VACCINES  Aged Out    Health Maintenance  There are no preventive care reminders to display for this patient.   Colorectal cancer screening: No longer required.   Lung Cancer Screening: (Low Dose CT Chest recommended if Age 87-80years, 30 pack-year currently smoking OR have quit w/in 15years.) does not qualify.   Lung Cancer Screening Referral: No  Additional Screening:  Hepatitis C Screening: does not qualify; Completed   Vision Screening: Recommended annual ophthalmology exams for early detection of glaucoma and other disorders of the eye.  Is the patient up to date with their annual eye exam?   unknown Who is the provider or what is the name of the office in which the patient attends annual eye exams? unknown If pt is not established with a provider, would they like to be referred to a provider to establish care? No .   Dental Screening: Recommended annual dental exams for proper oral hygiene  Community Resource Referral / Chronic Care Management: CRR required this visit?  No   CCM required this visit?  No      Plan:     I have personally reviewed and noted the following in the patient's chart:   Medical and social history Use of alcohol, tobacco or illicit drugs  Current medications and supplements including opioid prescriptions. Patient is not currently taking opioid prescriptions. Functional ability and status Nutritional status Physical  activity Advanced directives List of other physicians Hospitalizations, surgeries, and ER visits in previous 12 months Vitals Screenings to include cognitive, depression, and falls Referrals and appointments  In addition, I have reviewed and discussed with patient certain preventive protocols, quality metrics, and best practice recommendations. A written personalized care plan for preventive services as well as general preventive health recommendations were provided to patient.     Stephen Alanis, NP   01/19/2023   Nurse Notes: Refused Shingrix vaccine, reports having zoster in past

## 2023-01-19 NOTE — Patient Instructions (Signed)
  Stephen Cuevas , Thank you for taking time to come for your Medicare Wellness Visit. I appreciate your ongoing commitment to your health goals. Please review the following plan we discussed and let me know if I can assist you in the future.   These are the goals we discussed:  Goals      Maintain Mobility and Function     Evidence-based guidance:  Acknowledge and validate impact of pain, loss of strength and potential disfigurement (hand osteoarthritis) on mental health and daily life, such as social isolation, anxiety, depression, impaired sexual relationship and   injury from falls.  Anticipate referral to physical or occupational therapy for assessment, therapeutic exercise and recommendation for adaptive equipment or assistive devices; encourage participation.  Assess impact on ability to perform activities of daily living, as well as engage in sports and leisure events or requirements of work or school.  Provide anticipatory guidance and reassurance about the benefit of exercise to maintain function; acknowledge and normalize fear that exercise may worsen symptoms.  Encourage regular exercise, at least 10 minutes at a time for 45 minutes per week; consider yoga, water exercise and proprioceptive exercises; encourage use of wearable activity tracker to increase motivation and adherence.  Encourage maintenance or resumption of daily activities, including employment, as pain allows and with minimal exposure to trauma.  Assist patient to advocate for adaptations to the work environment.  Consider level of pain and function, gender, age, lifestyle, patient preference, quality of life, readiness and ?ocapacity to benefit? when recommending patients for orthopaedic surgery consultation.  Explore strategies, such as changes to medication regimen or activity that enables patient to anticipate and manage flare-ups that increase deconditioning and disability.  Explore patient preferences; encourage  exposure to a broader range of activities that have been avoided for fear of experiencing pain.  Identify barriers to participation in therapy or exercise, such as pain with activity, anticipated or imagined pain.  Monitor postoperative joint replacement or any preexisting joint replacement for ongoing pain and loss of function; provide social support and encouragement throughout recovery.   Notes:         This is a list of the screening recommended for you and due dates:  Health Maintenance  Topic Date Due   COVID-19 Vaccine (6 - 2023-24 season) 02/03/2023*   Zoster (Shingles) Vaccine (1 of 2) 04/21/2023*   Medicare Annual Wellness Visit  01/19/2024   DTaP/Tdap/Td vaccine (2 - Td or Tdap) 02/11/2032   Pneumonia Vaccine  Completed   Flu Shot  Completed   HPV Vaccine  Aged Out  *Topic was postponed. The date shown is not the original due date.

## 2023-01-24 LAB — CBC AND DIFFERENTIAL
HCT: 37 — AB (ref 41–53)
Hemoglobin: 12 — AB (ref 13.5–17.5)
Neutrophils Absolute: 3802
Platelets: 169 10*3/uL (ref 150–400)
WBC: 6.6

## 2023-01-24 LAB — BASIC METABOLIC PANEL
BUN: 22 — AB (ref 4–21)
CO2: 26 — AB (ref 13–22)
Chloride: 108 (ref 99–108)
Creatinine: 1.3 (ref 0.6–1.3)
Glucose: 84
Potassium: 4.3 mEq/L (ref 3.5–5.1)
Sodium: 143 (ref 137–147)

## 2023-01-24 LAB — CBC: RBC: 3.88 (ref 3.87–5.11)

## 2023-01-24 LAB — COMPREHENSIVE METABOLIC PANEL
Albumin: 3.4 — AB (ref 3.5–5.0)
Calcium: 8.5 — AB (ref 8.7–10.7)
Globulin: 2.3
eGFR: 50

## 2023-01-24 LAB — HEPATIC FUNCTION PANEL
ALT: 27 U/L (ref 10–40)
AST: 24 (ref 14–40)
Alkaline Phosphatase: 74 (ref 25–125)
Bilirubin, Total: 0.4

## 2023-01-24 LAB — LIPID PANEL
Cholesterol: 186 (ref 0–200)
HDL: 73 — AB (ref 35–70)
LDL Cholesterol: 94
Triglycerides: 96 (ref 40–160)

## 2023-01-24 LAB — TSH: TSH: 2.78 (ref 0.41–5.90)

## 2023-02-26 ENCOUNTER — Ambulatory Visit: Payer: Medicare Other | Admitting: Dermatology

## 2023-05-04 ENCOUNTER — Encounter: Payer: Self-pay | Admitting: Orthopedic Surgery

## 2023-05-04 ENCOUNTER — Non-Acute Institutional Stay: Payer: Medicare Other | Admitting: Orthopedic Surgery

## 2023-05-04 DIAGNOSIS — F5105 Insomnia due to other mental disorder: Secondary | ICD-10-CM

## 2023-05-04 DIAGNOSIS — I482 Chronic atrial fibrillation, unspecified: Secondary | ICD-10-CM

## 2023-05-04 DIAGNOSIS — F339 Major depressive disorder, recurrent, unspecified: Secondary | ICD-10-CM

## 2023-05-04 DIAGNOSIS — M5442 Lumbago with sciatica, left side: Secondary | ICD-10-CM

## 2023-05-04 DIAGNOSIS — Z85828 Personal history of other malignant neoplasm of skin: Secondary | ICD-10-CM

## 2023-05-04 DIAGNOSIS — G8929 Other chronic pain: Secondary | ICD-10-CM

## 2023-05-04 DIAGNOSIS — I10 Essential (primary) hypertension: Secondary | ICD-10-CM | POA: Diagnosis not present

## 2023-05-04 DIAGNOSIS — N401 Enlarged prostate with lower urinary tract symptoms: Secondary | ICD-10-CM

## 2023-05-04 DIAGNOSIS — F418 Other specified anxiety disorders: Secondary | ICD-10-CM

## 2023-05-04 DIAGNOSIS — H6123 Impacted cerumen, bilateral: Secondary | ICD-10-CM

## 2023-05-04 DIAGNOSIS — R600 Localized edema: Secondary | ICD-10-CM

## 2023-05-04 DIAGNOSIS — K219 Gastro-esophageal reflux disease without esophagitis: Secondary | ICD-10-CM

## 2023-05-04 DIAGNOSIS — I495 Sick sinus syndrome: Secondary | ICD-10-CM

## 2023-05-04 DIAGNOSIS — R351 Nocturia: Secondary | ICD-10-CM

## 2023-05-04 NOTE — Progress Notes (Signed)
Location:    Nursing Home Room Number: 37-A Place of Service:  ALF [phone]) Provider:  Hazle Nordmann, NP  PCP: Mahlon Gammon, MD  Patient Care Team: Mahlon Gammon, MD as PCP - General (Internal Medicine) Janalyn Harder, MD (Inactive) as Consulting Physician (Dermatology)  Extended Emergency Contact Information Primary Emergency Contact: Georga Hacking Address: [address]          [place], [place] [address] Darden Amber of [place] Home Phone: [phone] Work Phone: [phone] Mobile Phone: [phone] Relation: Son Secondary Emergency Contact: Leazer,Chris Mobile Phone: [phone] Relation: Son Preferred language: English  Code Status:  DNR Goals of care: Advanced Directive information    05/04/2023   10:45 AM  Advanced Directives  Does Patient Have a Medical Advance Directive? Yes  Type of Estate agent of [place];Living will;Out of facility DNR (pink MOST or yellow form)  Does patient want to make changes to medical advance directive? No - Patient declined  Copy of Healthcare Power of Attorney in Chart? Yes - validated most recent copy scanned in chart (See row information)     Chief Complaint  Patient presents with   Medical Management of Chronic Issues    Routine Visit.    Immunizations    Discuss the need for Shingrix vaccine, and Covid Booster.    HPI:  Pt is a 89 y.o. male seen today for medical management of chronic diseases.    He currently resides on the assisted living unit at [facility]. PMH: GERD, constipation, HTN, afib, tachy-brady syndrome s/p pacemaker, left hip fracture s/p LATH 09/2021 , CKD, anemia, BPH, insomnia, depression.     Atrial fibrillation- TSH 2.38 08/07/2022, HR controlled with amiodarone and Cardizem, remains on Eliquis for clot prevention HTN- BUN/creat 18/1.28 08/07/2022, remains on furosemide Tachy- brady syndrome- s/p pacemaker 06/04/2020 Chronic back pain- remains on low dose prednisone and  scheduled tylenol BLE- LVEF 60-65% 02/2020, doppler negative 10/2021, remains on furosemide and ted hose daily GERD- hgb 11.5 11/21/2021, remains on omeprazole Insomnia- remains on Remeron and Trazodone Depression- no mood changes, remains on Remeron Nocturia/insomnia- improved, sometimes no episodes at night, uses urinal, remains on furosemide and Flomax H/o skin cancer- skin biopsies 02/27/2023- 4 areas on scalp, f/u 06/17 advised to wash with gentle soap and cover with Vaseline, may apply hydrogen peroxide if thick crusts develop  No recent falls or injuries.   Recent blood pressures:  06/19- 131/88  06/12- 121/75  06/05- 136/76  Recent weights:  06/01- 181 lbs  05/03- 180.8 lbs  04/02- 178.8 lbs     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   History of kidney stones    Hypertension    Kidney stone    Presence of permanent cardiac pacemaker 06/03/2020   SCC (squamous cell carcinoma) 07/17/2008   SCC WELL DIFF (RIGHT HELICAL MARGIN)(MOHS)   SCC (squamous cell carcinoma) 03/24/2013   SCC IN SITU (BACK SCALP)(TX AFTER BX)   SCC (squamous cell carcinoma) 03/24/2013   SCC IN SITU (RIGHT SCALP)(TX AFTER BX)   SCC (squamous  cell carcinoma) 03/24/2013   SCC IN SITU (MID SCALP)(TX AFTER BX)   SCC (squamous cell carcinoma) 03/24/2013   SCC IN SITU (MID NECK)(TX AFTER BX)   Spinal stenosis    Squamous cell carcinoma of skin 11/25/1997   ant right scalp    Tachy-brady syndrome (HCC) 06/03/2020   Past Surgical History:  Procedure Laterality Date   addnoid     CYSTOSCOPY W/ URETEROSCOPY  1980   INSERT / REPLACE / REMOVE  PACEMAKER  06/03/2020   KNEE SURGERY     NAILBED REPAIR Left 06/27/2016   Procedure: NAILBED biopsy left thumb;  Surgeon: Cindee Salt, MD;  Location: [facility];  Service: Orthopedics;  Laterality: Left;  FAB   PACEMAKER IMPLANT N/A 06/03/2020   Procedure: PACEMAKER IMPLANT;  Surgeon: Regan Lemming, MD;  [facility]: [facility];  Service: Cardiovascular;  Laterality: N/A;   TONSILLECTOMY     TOTAL HIP ARTHROPLASTY Left 10/04/2021   Procedure: TOTAL HIP ARTHROPLASTY ANTERIOR APPROACH;  Surgeon: Durene Romans, MD;  Location: [facility];  Service: Orthopedics;  Laterality: Left;    No Known Allergies  Allergies as of 05/04/2023   No Known Allergies      Medication List        Accurate as of May 04, 2023 10:46 AM. If you have any questions, ask your nurse or doctor.          acetaminophen 325 MG tablet Commonly known as: TYLENOL Take 650 mg by mouth every 8 (eight) hours as needed.   acetaminophen 325 MG tablet Commonly known as: TYLENOL Take 650 mg by mouth daily.   amiodarone 200 MG tablet Commonly known as: PACERONE Take 200 mg by mouth daily.   amoxicillin 500 MG tablet Commonly known as: AMOXIL Take 2,000 mg by mouth once as needed. Take one hour prior to dental procedures   apixaban 2.5 MG Tabs tablet Commonly known as: Eliquis Take 1 tablet (2.5 mg total) by mouth 2 (two) times daily.   atorvastatin 10 MG tablet Commonly known as: LIPITOR Take 10 mg by mouth daily.   cetirizine 10 MG tablet Commonly known as: ZYRTEC Take 1 tablet (10 mg total) by mouth at bedtime.   cyanocobalamin 1000 MCG tablet Take 1,000 mcg by mouth every morning.   diltiazem 120 MG 24 hr capsule Commonly known as: CARDIZEM CD Take 120 mg by mouth daily.   docusate sodium 100 MG capsule Commonly known as: COLACE Take 1 capsule (100 mg total) by mouth 2 (two) times daily.   fluorouracil 5 % cream Commonly known as: EFUDEX Apply 1 Application topically 2  (two) times daily.   furosemide 20 MG tablet Commonly known as: LASIX Take 20 mg by mouth daily.   magnesium hydroxide 400 MG/5ML suspension Commonly known as: MILK OF MAGNESIA Take 30 mLs by mouth daily as needed for mild constipation.   melatonin 5 MG Tabs Take 5 mg by mouth at bedtime as needed.   mirtazapine 15 MG tablet Commonly known as: REMERON Take 15 mg by mouth at bedtime.   omeprazole 20 MG capsule Commonly known as: PRILOSEC Take 20 mg by mouth daily with breakfast.   polyethylene glycol 17 g packet Commonly known as: MIRALAX / GLYCOLAX Take 17 g by mouth daily as needed.   predniSONE 5 MG tablet Commonly known as: DELTASONE Take 5 mg by mouth daily with breakfast.   PRESERVISION AREDS 2+MULTI VIT PO Take 1 capsule by mouth in the morning and at bedtime.  senna-docusate 8.6-50 MG tablet Commonly known as: Senokot-S Take 2 tablets by mouth daily.   tamsulosin 0.4 MG Caps capsule Commonly known as: FLOMAX Take 0.4 mg by mouth daily.   timolol 0.5 % ophthalmic solution Commonly known as: TIMOPTIC Place 1 drop into both eyes daily.   traZODone 50 MG tablet Commonly known as: DESYREL Take 50 mg by mouth daily.   zinc oxide 20 % ointment Apply 1 application topically as needed for irritation.        Review of Systems  Constitutional:  Negative for activity change and appetite change.  HENT:  Positive for hearing loss. Negative for trouble swallowing.   Eyes:  Negative for visual disturbance.  Respiratory:  Negative for cough, shortness of breath and wheezing.   Cardiovascular:  Positive for leg swelling. Negative for chest pain.  Gastrointestinal:  Negative for abdominal distention and abdominal pain.  Genitourinary:  Positive for frequency. Negative for dysuria and hematuria.  Musculoskeletal:  Positive for arthralgias and gait problem.  Skin:  Positive for wound.  Neurological:  Positive for weakness. Negative for dizziness and  light-headedness.  Psychiatric/Behavioral:  Positive for sleep disturbance. Negative for confusion and dysphoric mood. The patient is not nervous/anxious.     Immunization History  Administered Date(s) Administered   Fluad Quad(high Dose 65+) 09/06/2022   Influenza, High Dose Seasonal PF 08/07/2016   Influenza-Unspecified 08/04/2020, 09/06/2021   Moderna SARS-COV2 Booster Vaccination 09/09/2020   Moderna Sars-Covid-2 Vaccination 11/18/2019, 12/15/2019, 09/19/2022   PFIZER(Purple Top)SARS-COV-2 Vaccination 08/03/2021   PNEUMOCOCCAL CONJUGATE-20 02/10/2022   Pfizer Covid-19 Vaccine Bivalent Booster 86yrs & up 04/26/2022   Pneumococcal-Unspecified 09/13/2020   Tdap 02/10/2022   Pertinent  Health Maintenance Due  Topic Date Due   INFLUENZA VACCINE  06/14/2023      10/07/2021    8:35 PM 10/08/2021    7:26 AM 12/23/2021   12:37 PM 10/27/2022   11:09 AM 01/19/2023    2:15 PM  Fall Risk  Falls in the past year?   1 0 1  Was there an injury with Fall?   1 0 0  Fall Risk Category Calculator   3 0 1  Fall Risk Category (Retired)   High Low   (RETIRED) Patient Fall Risk Level High fall risk High fall risk High fall risk High fall risk   Patient at Risk for Falls Due to   History of fall(s);Impaired balance/gait;Impaired mobility;Orthopedic patient History of fall(s) History of fall(s);Impaired mobility  Fall risk Follow up   Falls evaluation completed;Education provided;Falls prevention discussed Falls evaluation completed Falls evaluation completed;Falls prevention discussed;Education provided   Functional Status Survey:    Vitals:   05/04/23 1040  BP: 131/88  Pulse: 70  Resp: 18  Temp: 98.7 F (37.1 C)  SpO2: 99%  Weight: 181 lb (82.1 kg)  Height: 5\' 6"  (1.676 m)   Body mass index is 29.21 kg/m. Physical Exam Vitals reviewed.  Constitutional:      General: He is not in acute distress. HENT:     Head: Normocephalic.     Right Ear: There is impacted cerumen.     Left Ear:  There is impacted cerumen.     Nose: Nose normal.     Mouth/Throat:     Mouth: Mucous membranes are moist.  Eyes:     General:        Right eye: No discharge.        Left eye: No discharge.  Cardiovascular:     Rate and Rhythm: Normal  rate. Rhythm irregular.     Pulses: Normal pulses.     Heart sounds: Normal heart sounds.  Pulmonary:     Effort: Pulmonary effort is normal. No respiratory distress.     Breath sounds: Normal breath sounds. No wheezing.  Abdominal:     General: Bowel sounds are normal. There is no distension.     Palpations: Abdomen is soft.     Tenderness: There is no abdominal tenderness.  Musculoskeletal:     Cervical back: Neck supple.     Right lower leg: No edema.     Left lower leg: No edema.  Skin:    General: Skin is warm and dry.     Capillary Refill: Capillary refill takes less than 2 seconds.     Comments: Scalp with dried skin, scarring from recent Mohs present, no sign on infection  Neurological:     General: No focal deficit present.     Mental Status: He is alert and oriented to person, place, and time.     Motor: Weakness present.     Gait: Gait abnormal.     Comments: walker  Psychiatric:        Mood and Affect: Mood normal.     Labs reviewed: No results for input(s): "NA", "K", "CL", "CO2", "GLUCOSE", "BUN", "CREATININE", "CALCIUM", "MG", "PHOS" in the last 8760 hours. No results for input(s): "AST", "ALT", "ALKPHOS", "BILITOT", "PROT", "ALBUMIN" in the last 8760 hours. No results for input(s): "WBC", "NEUTROABS", "HGB", "HCT", "MCV", "PLT" in the last 8760 hours. No results found for: "TSH" No results found for: "HGBA1C" Lab Results  Component Value Date   CHOL 137 09/27/2020   HDL 44 09/27/2020   LDLCALC 75 09/27/2020   TRIG 92 09/27/2020    Significant Diagnostic Results in last 30 days:  No results found.  Assessment/Plan 1. Bilateral impacted cerumen - start debrox- 5 gtts to both ears at bedtime x 5 days - flush ears  when debrox complete  2. Atrial fibrillation, chronic (HCC) - HR< 100 with amiodarone and Cardizem - TSH stable - cont Eliquis for clot prevention  3. Essential hypertension - controlled, on furosemide  4. Tachy-brady syndrome (HCC) - s/p pacemaker 05/2020  5. Chronic bilateral low back pain with left-sided sciatica - cont low dose prednisone and tylenol  6. Peripheral edema - cont furosemide and ted hose  7. Gastroesophageal reflux disease without esophagitis - hgb stable - cont omeprazole  8. Insomnia secondary to depression with anxiety - cont trazodone  9. Recurrent depression (HCC) - cont Remeron  10. Nocturia associated with benign prostatic hyperplasia - evaluated by urology - improved> sometimes no episodes at night - cont Flomax  11. History of skin cancer - recent Mohs to scalp - no sign of infection - cont Vaseline applications and peroxide if crusting     Family/ staff Communication: plan discussed with patient and nurse  Labs/tests ordered:  cbc/diff, cmp, TSH

## 2023-05-07 LAB — COMPREHENSIVE METABOLIC PANEL
Albumin: 3.4 — AB (ref 3.5–5.0)
Calcium: 8.6 — AB (ref 8.7–10.7)
Globulin: 2.3
eGFR: 47

## 2023-05-07 LAB — BASIC METABOLIC PANEL
BUN: 20 (ref 4–21)
CO2: 25 — AB (ref 13–22)
Chloride: 108 (ref 99–108)
Creatinine: 1.4 — AB (ref 0.6–1.3)
Glucose: 85
Potassium: 4 mEq/L (ref 3.5–5.1)
Sodium: 143 (ref 137–147)

## 2023-05-07 LAB — CBC AND DIFFERENTIAL
HCT: 36 — AB (ref 41–53)
Hemoglobin: 11.6 — AB (ref 13.5–17.5)
Neutrophils Absolute: 4598
Platelets: 164 10*3/uL (ref 150–400)
WBC: 8.3

## 2023-05-07 LAB — TSH: TSH: 2.55 (ref 0.41–5.90)

## 2023-05-07 LAB — HEPATIC FUNCTION PANEL
ALT: 24 U/L (ref 10–40)
AST: 22 (ref 14–40)
Alkaline Phosphatase: 72 (ref 25–125)
Bilirubin, Total: 0.5

## 2023-05-07 LAB — CBC: RBC: 3.82 — AB (ref 3.87–5.11)

## 2023-06-12 ENCOUNTER — Encounter: Payer: Self-pay | Admitting: Adult Health

## 2023-06-12 ENCOUNTER — Non-Acute Institutional Stay: Payer: Medicare Other | Admitting: Adult Health

## 2023-06-12 DIAGNOSIS — K219 Gastro-esophageal reflux disease without esophagitis: Secondary | ICD-10-CM | POA: Diagnosis not present

## 2023-06-12 DIAGNOSIS — I482 Chronic atrial fibrillation, unspecified: Secondary | ICD-10-CM | POA: Diagnosis not present

## 2023-06-12 DIAGNOSIS — R0789 Other chest pain: Secondary | ICD-10-CM | POA: Diagnosis not present

## 2023-06-12 MED ORDER — PANTOPRAZOLE SODIUM 40 MG PO TBEC
40.0000 mg | DELAYED_RELEASE_TABLET | Freq: Every day | ORAL | Status: AC
Start: 2023-06-12 — End: ?

## 2023-06-12 NOTE — Progress Notes (Signed)
Location:  [facility] Room Number: 37 A Place of Service:  ALF [phone]) Provider:  Kenard Gower, DNP, FNP-BC  Patient Care Team: Mahlon Gammon, MD as PCP - General (Internal Medicine) Janalyn Harder, MD (Inactive) as Consulting Physician (Dermatology)  Extended Emergency Contact Information Primary Emergency Contact: Georga Hacking Address: [address]          [place], [place] [address] Darden Amber of [place] Home Phone: [phone] Work Phone: [phone] Mobile Phone: [phone] Relation: Son Secondary Emergency Contact: Derksen,Chris Mobile Phone: [phone] Relation: Son Preferred language: English  Code Status:  DNR  Goals of care: Advanced Directive information    06/12/2023   10:35 AM  Advanced Directives  Does Patient Have a Medical Advance Directive? Yes  Type of Estate agent of [place];Out of facility DNR (pink MOST or yellow form);Living will  Does patient want to make changes to medical advance directive? No - Patient declined  Copy of Healthcare Power of Attorney in Chart? Yes - validated most recent copy scanned in chart (See row information)  Pre-existing out of facility DNR order (yellow form or pink MOST form) Yellow form placed in chart (order not valid for inpatient use)     Chief Complaint  Patient presents with   Acute Visit    Chest tightness    HPI:  Pt is a 89 y.o. male seen today for an acute visit for chest tightness. He is a resident of [facility]. He has a PMH of hyperlipidemia, hypertension, sick sinus syndrome with paroxysmal atrial fibrillation and complete heart block. Patient stated that he had chest tightness when he woke up this morning at 4:05 am. He had chest tightness till 6 am when he got up. He denies having chest pains now. He drinks a glass of whiskey and wine every night. He stated hat he had burning sensation in his throat this morning. He takes Omeprazole  for GERD.    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   History of kidney stones    Hypertension    Kidney stone    Presence of permanent cardiac pacemaker 06/03/2020   SCC (squamous cell carcinoma) 07/17/2008   SCC WELL DIFF (RIGHT HELICAL MARGIN)(MOHS)   SCC (squamous cell carcinoma) 03/24/2013   SCC IN SITU (BACK SCALP)(TX AFTER BX)   SCC (squamous cell carcinoma) 03/24/2013   SCC IN SITU (RIGHT SCALP)(TX AFTER BX)   SCC (squamous cell carcinoma) 03/24/2013   SCC IN SITU (MID SCALP)(TX AFTER BX)   SCC (squamous cell carcinoma) 03/24/2013   SCC IN SITU (MID NECK)(TX AFTER BX)   Spinal stenosis    Squamous cell carcinoma of skin 11/25/1997   ant right scalp    Tachy-brady syndrome (HCC) 06/03/2020   Past Surgical History:  Procedure Laterality Date   addnoid     CYSTOSCOPY W/ URETEROSCOPY  1980   INSERT / REPLACE / REMOVE PACEMAKER  06/03/2020   KNEE SURGERY     NAILBED REPAIR Left 06/27/2016   Procedure: NAILBED biopsy left thumb;  Surgeon: Cindee Salt, MD;  Location: [facility];  Service: Orthopedics;  Laterality: Left;  FAB   PACEMAKER IMPLANT N/A 06/03/2020   Procedure: PACEMAKER IMPLANT;  Surgeon: Regan Lemming, MD;  Location: [facility];  Service: Cardiovascular;  Laterality: N/A;   TONSILLECTOMY     TOTAL HIP ARTHROPLASTY Left 10/04/2021   Procedure: TOTAL HIP ARTHROPLASTY ANTERIOR APPROACH;  Surgeon: Durene Romans, MD;  Location: [facility];  Service: Orthopedics;  Laterality: Left;    No Known Allergies  Outpatient Encounter Medications as of 06/12/2023  Medication Sig   acetaminophen  (TYLENOL) 325 MG tablet Take 650 mg by mouth every 8 (eight) hours as needed.   acetaminophen (TYLENOL) 325 MG tablet Take 650 mg by mouth daily.   amiodarone (PACERONE) 200 MG tablet Take 200 mg by mouth daily.   amoxicillin (AMOXIL) 500 MG tablet Take 2,000 mg by mouth once as needed. Take one hour prior to dental procedures   apixaban (ELIQUIS) 2.5 MG TABS tablet Take 1 tablet (2.5 mg total) by mouth 2 (two) times daily.   atorvastatin (LIPITOR) 10 MG tablet Take 10 mg by mouth daily.   cetirizine (ZYRTEC) 10 MG tablet Take 1 tablet (10 mg total) by mouth at bedtime.   cyanocobalamin 1000 MCG tablet Take 1,000 mcg by mouth every morning.   diltiazem (CARDIZEM CD) 120 MG 24 hr capsule Take 120 mg by mouth daily.   docusate sodium (COLACE) 100 MG capsule Take 1 capsule (100 mg total) by mouth 2 (two) times daily.   furosemide (LASIX) 20 MG tablet Take 20 mg by mouth daily.   magnesium hydroxide (MILK OF MAGNESIA) 400 MG/5ML suspension Take 30 mLs by mouth daily as needed for mild constipation.   melatonin 5 MG TABS Take 5 mg by mouth at bedtime as needed.   mirtazapine (REMERON) 15 MG tablet Take 15 mg by mouth at bedtime.   Multiple Vitamins-Minerals (PRESERVISION AREDS 2+MULTI VIT PO) Take 1 capsule by mouth in the morning and at bedtime.   omeprazole (PRILOSEC) 20 MG capsule Take 20 mg by mouth daily with breakfast.    polyethylene glycol (MIRALAX / GLYCOLAX) 17 g packet Take 17 g by mouth daily as needed.   predniSONE (DELTASONE) 5 MG tablet Take 5 mg by mouth daily with breakfast.   senna-docusate (SENOKOT-S) 8.6-50 MG tablet Take 2 tablets by mouth daily.   tamsulosin (FLOMAX) 0.4 MG CAPS capsule Take 0.4 mg by mouth daily.   timolol (TIMOPTIC) 0.5 % ophthalmic solution Place 1 drop into both eyes daily.   traZODone (DESYREL) 50 MG tablet Take 50 mg by mouth daily.   zinc oxide 20 % ointment Apply 1 application topically as needed for irritation.   [DISCONTINUED] fluorouracil (EFUDEX) 5  % cream Apply 1 Application topically 2 (two) times daily.   No facility-administered encounter medications on file as of 06/12/2023.    Review of Systems  Constitutional:  Negative for activity change, appetite change and fever.  HENT:  Negative for sore throat.   Eyes: Negative.   Cardiovascular:  Negative for chest pain and leg swelling.       Chest tightness  Gastrointestinal:  Negative for abdominal distention, diarrhea and vomiting.  Genitourinary:  Negative for dysuria, frequency and urgency.  Skin:  Negative for color change.  Neurological:  Negative for dizziness and headaches.  Psychiatric/Behavioral:  Negative for behavioral problems and sleep disturbance. The patient is not nervous/anxious.      Immunization History  Administered Date(s)  Administered   Fluad Quad(high Dose 65+) 09/06/2022   Influenza, High Dose Seasonal PF 08/07/2016   Influenza-Unspecified 08/04/2020, 09/06/2021   Moderna SARS-COV2 Booster Vaccination 09/09/2020   Moderna Sars-Covid-2 Vaccination 11/18/2019, 12/15/2019, 09/19/2022   PFIZER(Purple Top)SARS-COV-2 Vaccination 08/03/2021   PNEUMOCOCCAL CONJUGATE-20 02/10/2022   Pfizer Covid-19 Vaccine Bivalent Booster 80yrs & up 04/26/2022   Pneumococcal-Unspecified 09/13/2020   Tdap 02/10/2022   Pertinent  Health Maintenance Due  Topic Date Due   INFLUENZA VACCINE  06/14/2023      10/08/2021    7:26 AM 12/23/2021   12:37 PM 10/27/2022   11:09 AM 01/19/2023    2:15 PM 05/04/2023    3:25 PM  Fall Risk  Falls in the past year?  1 0 1 0  Was there an injury with Fall?  1 0 0 0  Fall Risk Category Calculator  3 0 1 0  Fall Risk Category (Retired)  High Low    (RETIRED) Patient Fall Risk Level High fall risk High fall risk High fall risk    Patient at Risk for Falls Due to  History of fall(s);Impaired balance/gait;Impaired mobility;Orthopedic patient History of fall(s) History of fall(s);Impaired mobility History of fall(s);Impaired  balance/gait;Impaired mobility  Fall risk Follow up  Falls evaluation completed;Education provided;Falls prevention discussed Falls evaluation completed Falls evaluation completed;Falls prevention discussed;Education provided Falls evaluation completed;Education provided;Falls prevention discussed     Vitals:   06/12/23 1033  BP: 124/78  Pulse: 70  Resp: 18  Temp: 98.4 F (36.9 C)  SpO2: 97%  Weight: 179 lb 6.4 oz (81.4 kg)  Height: 5\' 6"  (1.676 m)   Body mass index is 28.96 kg/m.  Physical Exam Constitutional:      Appearance: Normal appearance.  HENT:     Head: Normocephalic and atraumatic.     Mouth/Throat:     Mouth: Mucous membranes are moist.  Eyes:     Conjunctiva/sclera: Conjunctivae normal.  Cardiovascular:     Rate and Rhythm: Normal rate and regular rhythm.     Pulses: Normal pulses.     Heart sounds: Normal heart sounds.     Comments: Has a heart monitor on left chest Pulmonary:     Effort: Pulmonary effort is normal.     Breath sounds: Normal breath sounds.  Abdominal:     General: Bowel sounds are normal.     Palpations: Abdomen is soft.  Musculoskeletal:        General: Swelling present. Normal range of motion.     Cervical back: Normal range of motion.     Left lower leg: Edema present.     Comments: LLE 2+edema  Skin:    General: Skin is warm and dry.  Neurological:     General: No focal deficit present.     Mental Status: He is alert and oriented to person, place, and time.  Psychiatric:        Mood and Affect: Mood normal.        Behavior: Behavior normal.        Labs reviewed: Recent Labs    01/24/23 0000 05/07/23 0000  NA 143 143  K 4.3 4.0  CL 108 108  CO2 26* 25*  BUN 22* 20  CREATININE 1.3 1.4*  CALCIUM 8.5* 8.6*   Recent Labs    01/24/23 0000 05/07/23 0000  AST 24 22  ALT 27 24  ALKPHOS 74 72  ALBUMIN 3.4* 3.4*   Recent Labs    01/24/23 0000 05/07/23 0000  WBC 6.6 8.3  NEUTROABS 3,802.00 4,598.00  HGB 12.0*  11.6*  HCT 37* 36*  PLT 169 164   Lab Results  Component Value Date   TSH 2.55 05/07/2023   No results found for: "HGBA1C" Lab Results  Component Value Date   CHOL 186 01/24/2023   HDL 73 (A) 01/24/2023   LDLCALC 94 01/24/2023   TRIG 96 01/24/2023    Significant Diagnostic Results in last 30 days:  No results found.  Assessment/Plan   1. Feeling of chest tightness -  ordered troponin, CBC with differentials, BMP and ECG  2. Gastroesophageal reflux disease, unspecified whether esophagitis present -  will discontinue Omeprazole and start Protonix - pantoprazole (PROTONIX) 40 MG tablet; Take 1 tablet (40 mg total) by mouth daily.  3. Atrial fibrillation, chronic (HCC) -  rate-controlled -  has a heart monitor -  continue Eliquis, Diltiazem and Amiodarone - CBC and differential    Family/ staff Communication: Discussed plan of care with resident and charge nurse  Labs/tests ordered:   troponin, CBC with differentials, BMP and ECG    Kenard Gower, DNP, MSN, FNP-BC [facility] [phone] (Monday-Friday 8:00 a.m. - 5:00 p.m.) [phone] (after hours)

## 2023-06-15 LAB — BASIC METABOLIC PANEL
BUN: 18 (ref 4–21)
CO2: 25 — AB (ref 13–22)
Chloride: 104 (ref 99–108)
Creatinine: 1.4 — AB (ref 0.6–1.3)
Glucose: 83
Potassium: 4 meq/L (ref 3.5–5.1)
Sodium: 140 (ref 137–147)

## 2023-06-15 LAB — CBC AND DIFFERENTIAL
HCT: 37 — AB (ref 41–53)
Hemoglobin: 11.9 — AB (ref 13.5–17.5)
Neutrophils Absolute: 3494
Platelets: 167 10*3/uL (ref 150–400)
WBC: 6.4

## 2023-06-15 LAB — COMPREHENSIVE METABOLIC PANEL: Calcium: 9.1 (ref 8.7–10.7)

## 2023-06-15 LAB — CBC: RBC: 3.89 (ref 3.87–5.11)

## 2023-07-30 LAB — TSH: TSH: 2.47 (ref 0.41–5.90)

## 2023-08-13 ENCOUNTER — Ambulatory Visit: Payer: Medicare Other | Admitting: Cardiology

## 2023-09-06 ENCOUNTER — Non-Acute Institutional Stay: Payer: Medicare Other | Admitting: Internal Medicine

## 2023-09-06 ENCOUNTER — Encounter: Payer: Self-pay | Admitting: Internal Medicine

## 2023-09-06 DIAGNOSIS — I482 Chronic atrial fibrillation, unspecified: Secondary | ICD-10-CM | POA: Diagnosis not present

## 2023-09-06 DIAGNOSIS — U071 COVID-19: Secondary | ICD-10-CM

## 2023-09-06 NOTE — Progress Notes (Signed)
[facility] Service:  ALF (13)  Provider:   Code Status: DNR Goals of Care:     06/12/2023   10:35 AM  Advanced Directives  Does Patient Have a Medical Advance Directive? Yes  Type of Estate agent of [place];Out of facility DNR (pink MOST or yellow form);Living will  Does patient want to make changes to medical advance directive? No - Patient declined  Copy of Healthcare Power of Attorney in Chart? Yes - validated most recent copy scanned in chart (See row information)  Pre-existing out of facility DNR order (yellow form or pink MOST form) Yellow form placed in chart (order not valid for inpatient use)     Chief Complaint  Patient presents with   Acute Visit    HPI: Patient is a 89 y.o. male seen today for an acute visit for Covid Infection  Lives in AL in [facility]  Patient has a history of A. fib on Eliquis, hypertension, hyperlipidemia,   right clavicle and right pubic ramus fracture in 10/21 H/o Tachybrady s/p Pacemaker  Also h/o Chronic Pain in Lower Back  ON Chronic Prednisone per previous PCP   Underwent Left Hip Arthroplasty in 11/22  Patient was not feeling well Tested Positive for Covid Today feeling weak tired not eating  Cough  Not SOB no Fever  No Nausea Or vomiting  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   History of kidney stones    Hypertension    Kidney stone    Presence of permanent cardiac pacemaker 06/03/2020   SCC (squamous cell carcinoma) 07/17/2008   SCC WELL DIFF (RIGHT HELICAL MARGIN)(MOHS)   SCC (squamous cell carcinoma) 03/24/2013    SCC IN SITU (BACK SCALP)(TX AFTER BX)   SCC (squamous cell carcinoma) 03/24/2013   SCC IN SITU (RIGHT SCALP)(TX AFTER BX)   SCC (squamous cell carcinoma) 03/24/2013   SCC IN SITU (MID SCALP)(TX AFTER BX)   SCC (squamous cell carcinoma) 03/24/2013   SCC IN SITU (MID NECK)(TX AFTER BX)   Spinal stenosis    Squamous cell carcinoma of skin 11/25/1997   ant right scalp    Tachy-brady syndrome (HCC) 06/03/2020    Past Surgical History:  Procedure Laterality Date   addnoid     CYSTOSCOPY W/ URETEROSCOPY  1980   INSERT / REPLACE / REMOVE PACEMAKER  06/03/2020   KNEE SURGERY     NAILBED REPAIR Left 06/27/2016   Procedure: NAILBED biopsy left thumb;  Surgeon: Cindee Salt, MD;  Location: [facility];  Service: Orthopedics;  Laterality: Left;  FAB   PACEMAKER IMPLANT N/A 06/03/2020   Procedure: PACEMAKER IMPLANT;  Surgeon: Regan Lemming, MD;  [facility]: [facility];  Service: Cardiovascular;  Laterality: N/A;   TONSILLECTOMY     TOTAL HIP ARTHROPLASTY Left 10/04/2021   Procedure: TOTAL HIP ARTHROPLASTY ANTERIOR APPROACH;  Surgeon: Durene Romans, MD;  Location: [facility];  Service: Orthopedics;  Laterality: Left;    No Known Allergies  Outpatient Encounter Medications as of 09/06/2023  Medication Sig   acetaminophen (TYLENOL) 325 MG tablet Take 650 mg by mouth every 8 (eight) hours as needed.   acetaminophen (TYLENOL) 325 MG tablet Take 650 mg by mouth daily.   amiodarone (PACERONE) 200 MG tablet Take 200 mg by mouth daily.   amoxicillin (AMOXIL) 500 MG tablet Take 2,000 mg by mouth once as needed. Take one hour prior to dental procedures   apixaban (ELIQUIS) 2.5 MG TABS tablet Take 1 tablet (2.5 mg total) by mouth 2 (two) times daily.   atorvastatin (LIPITOR) 10 MG tablet Take 10 mg by mouth daily.   cetirizine (ZYRTEC) 10 MG tablet Take 1 tablet (10 mg total) by mouth at bedtime.   cyanocobalamin 1000 MCG tablet Take 1,000 mcg by mouth every morning.   diltiazem  (CARDIZEM CD) 120 MG 24 hr capsule Take 120 mg by mouth daily.   docusate sodium (COLACE) 100 MG capsule Take 1 capsule (100 mg total) by mouth 2 (two) times daily.   furosemide (LASIX) 20 MG tablet Take 20 mg by mouth daily.   magnesium hydroxide (MILK OF MAGNESIA) 400 MG/5ML suspension Take 30 mLs by mouth daily as needed for mild constipation.   melatonin 5 MG TABS Take 5 mg by mouth at bedtime as needed.   mirtazapine (REMERON) 15 MG tablet Take 15 mg by mouth at bedtime.   Multiple Vitamins-Minerals (PRESERVISION AREDS 2+MULTI VIT PO) Take 1 capsule by mouth in the morning and at bedtime.   pantoprazole (PROTONIX) 40 MG tablet Take 1 tablet (40 mg total) by mouth daily.   polyethylene glycol (MIRALAX / GLYCOLAX) 17 g packet Take 17 g by mouth daily as needed.   predniSONE (DELTASONE) 5 MG tablet Take 5 mg by mouth daily with breakfast.   senna-docusate (SENOKOT-S) 8.6-50 MG tablet Take 2 tablets by mouth daily.   tamsulosin (FLOMAX) 0.4 MG CAPS capsule Take 0.4 mg by mouth daily.   timolol (TIMOPTIC) 0.5 % ophthalmic solution Place 1 drop into both eyes daily.   traZODone (DESYREL) 50 MG tablet Take 50 mg by mouth daily.   zinc oxide 20 % ointment Apply 1 application topically as needed for irritation.   No facility-administered encounter medications on file as of 09/06/2023.    Review of Systems:  Review of Systems  Constitutional:  Positive for activity change and appetite change. Negative for unexpected weight change.  HENT: Negative.    Respiratory:  Positive for cough. Negative for shortness of breath.   Cardiovascular:  Negative for leg swelling.  Gastrointestinal:  Negative for constipation.  Genitourinary:  Negative for frequency.  Musculoskeletal:  Negative for arthralgias, gait problem and myalgias.  Skin: Negative.  Negative for rash.  Neurological:  Positive for weakness. Negative for dizziness.  Psychiatric/Behavioral:  Negative for confusion and sleep disturbance.    All other systems reviewed and are negative.   Health Maintenance  Topic Date Due   Zoster Vaccines- Shingrix (1 of 2) Never done   INFLUENZA VACCINE  06/14/2023   COVID-19 Vaccine (6 - 2023-24 season) 07/15/2023   Medicare Annual Wellness (AWV)  01/19/2024   DTaP/Tdap/Td (2 - Td or Tdap) 02/11/2032   Pneumonia Vaccine 14+ Years old  Completed   HPV VACCINES  Aged Out    Physical Exam: Vitals:   09/06/23 1547  BP: 137/74  Pulse: 65  Resp: (!) 22  Temp: 97.9 F (36.6 C)  SpO2: 96%  Weight: 178 lb 12.8 oz (81.1 kg)   Body mass index is 28.86 kg/m. Physical Exam  Vitals reviewed.  Constitutional:      Appearance: Normal appearance.  HENT:     Head: Normocephalic.     Nose: Nose normal.     Mouth/Throat:     Mouth: Mucous membranes are moist.     Pharynx: Oropharynx is clear.  Eyes:     Pupils: Pupils are equal, round, and reactive to light.  Cardiovascular:     Rate and Rhythm: Normal rate and regular rhythm.     Pulses: Normal pulses.     Heart sounds: No murmur heard. Pulmonary:     Effort: Pulmonary effort is normal. No respiratory distress.     Breath sounds: Normal breath sounds. No rales.  Abdominal:     General: Abdomen is flat. Bowel sounds are normal.     Palpations: Abdomen is soft.  Musculoskeletal:        General: No swelling.     Cervical back: Neck supple.  Skin:    General: Skin is warm.  Neurological:     General: No focal deficit present.     Mental Status: He is alert and oriented to person, place, and time.  Psychiatric:        Mood and Affect: Mood normal.        Thought Content: Thought content normal.     Labs reviewed: Basic Metabolic Panel: Recent Labs    01/24/23 0000 05/07/23 0000  NA 143 143  K 4.3 4.0  CL 108 108  CO2 26* 25*  BUN 22* 20  CREATININE 1.3 1.4*  CALCIUM 8.5* 8.6*  TSH 2.78 2.55   Liver Function Tests: Recent Labs    01/24/23 0000 05/07/23 0000  AST 24 22  ALT 27 24  ALKPHOS 74 72  ALBUMIN  3.4* 3.4*   No results for input(s): "LIPASE", "AMYLASE" in the last 8760 hours. No results for input(s): "AMMONIA" in the last 8760 hours. CBC: Recent Labs    01/24/23 0000 05/07/23 0000  WBC 6.6 8.3  NEUTROABS 3,802.00 4,598.00  HGB 12.0* 11.6*  HCT 37* 36*  PLT 169 164   Lipid Panel: Recent Labs    01/24/23 0000  CHOL 186  HDL 73*  LDLCALC 94  TRIG 96   No results found for: "HGBA1C"  Procedures since last visit: No results found.  Assessment/Plan 1. COVID-19 virus infection Molnupiravir BID for 5 days Vital Q8   2. Atrial fibrillation, chronic (HCC) On Eliquis and AMiodarone    Labs/tests ordered:  * No order type specified * Next appt:  Visit date not found

## 2023-09-07 ENCOUNTER — Encounter: Payer: Self-pay | Admitting: Internal Medicine

## 2023-10-15 ENCOUNTER — Non-Acute Institutional Stay: Payer: Medicare Other | Admitting: Orthopedic Surgery

## 2023-10-15 ENCOUNTER — Encounter: Payer: Self-pay | Admitting: Orthopedic Surgery

## 2023-10-15 DIAGNOSIS — M545 Low back pain, unspecified: Secondary | ICD-10-CM

## 2023-10-15 MED ORDER — ACETAMINOPHEN 500 MG PO TABS
1000.0000 mg | ORAL_TABLET | Freq: Three times a day (TID) | ORAL | Status: AC | PRN
Start: 2023-10-15 — End: ?

## 2023-10-15 MED ORDER — TRAMADOL HCL 50 MG PO TABS
25.0000 mg | ORAL_TABLET | Freq: Three times a day (TID) | ORAL | Status: DC | PRN
Start: 2023-10-15 — End: 2023-10-22

## 2023-10-15 MED ORDER — LIDOCAINE 4 % EX PTCH: 1.0000 | MEDICATED_PATCH | CUTANEOUS | Status: AC

## 2023-10-15 NOTE — Progress Notes (Signed)
Location:  [facility] Room Number: 37/A Place of Service:  ALF [phone]) Provider:  Octavia Heir, NP   Mahlon Gammon, MD  Patient Care Team: Mahlon Gammon, MD as PCP - General (Internal Medicine) Janalyn Harder, MD (Inactive) as Consulting Physician (Dermatology)  Extended Emergency Contact Information Primary Emergency Contact: Georga Hacking Address: [address]          [place], [place] [address] [place] of [employer] Home Phone: [phone] Work Phone: [phone] Mobile Phone: [phone] Relation: Son Secondary Emergency Contact: Boda,Chris Mobile Phone: [phone] Relation: Son Preferred language: English  Code Status:  DNR Goals of care: Advanced Directive information    06/12/2023   10:35 AM  Advanced Directives  Does Patient Have a Medical Advance Directive? Yes  Type of Estate agent of [place];Out of facility DNR (pink MOST or yellow form);Living will  Does patient want to make changes to medical advance directive? No - Patient declined  Copy of Healthcare Power of Attorney in Chart? Yes - validated most recent copy scanned in chart (See row information)  Pre-existing out of facility DNR order (yellow form or pink MOST form) Yellow form placed in chart (order not valid for inpatient use)     Chief Complaint  Patient presents with   Acute Visit    HPI:  Pt is a 89 y.o. male seen today for acute visit due to back pain.   He currently resides on the assisted living unit at [facility]. PMH: GERD, constipation, HTN, afib, tachy-brady syndrome s/p pacemaker, left hip fracture s/p LATH 09/2021 , CKD, anemia, BPH, insomnia, depression.    H/o chronic back pain. CT spine noted moderate degenerative disc disease C5-C6 and C6-C7 in 2021. CT lumbar spine noted stenosis to L4-L5, L5-S1, narrowing to L3-L4 in 2018. 11/28 He began to have increased middle back pain. No recent fall or injury. 11/29 he was started  on prednisone taper per on call provider. Today, he reports no improvement with increased prednisone. Middle back pain described as "sharp", increased with movement, no radiation. Denies loss of B&B. He has also been given tylenol 650 mg prn. He has not been getting OOB and using urinal. Afebrile. Vitals stable.    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   History of kidney stones    Hypertension    Kidney stone    Presence of permanent cardiac pacemaker 06/03/2020   SCC (squamous cell carcinoma) 07/17/2008   SCC WELL DIFF (RIGHT HELICAL MARGIN)(MOHS)   SCC (squamous cell carcinoma) 03/24/2013   SCC IN SITU (BACK SCALP)(TX AFTER BX)   SCC (squamous cell carcinoma) 03/24/2013   SCC IN SITU (RIGHT SCALP)(TX AFTER BX)   SCC (squamous cell carcinoma) 03/24/2013   SCC IN SITU (MID SCALP)(TX AFTER BX)   SCC (squamous cell carcinoma) 03/24/2013   SCC IN SITU (MID NECK)(TX AFTER BX)   Spinal stenosis    Squamous cell carcinoma of skin 11/25/1997   ant right scalp    Tachy-brady syndrome (HCC) 06/03/2020   Past  Surgical History:  Procedure Laterality Date   addnoid     CYSTOSCOPY W/ URETEROSCOPY  1980   INSERT / REPLACE / REMOVE PACEMAKER  06/03/2020   KNEE SURGERY     NAILBED REPAIR Left 06/27/2016   Procedure: NAILBED biopsy left thumb;  Surgeon: Cindee Salt, MD;  Location: [facility];  Service: Orthopedics;  Laterality: Left;  FAB   PACEMAKER IMPLANT N/A 06/03/2020   Procedure: PACEMAKER IMPLANT;  Surgeon: Regan Lemming, MD;  Location: [facility];  Service: Cardiovascular;  Laterality: N/A;    TONSILLECTOMY     TOTAL HIP ARTHROPLASTY Left 10/04/2021   Procedure: TOTAL HIP ARTHROPLASTY ANTERIOR APPROACH;  Surgeon: Durene Romans, MD;  Location: [facility];  Service: Orthopedics;  Laterality: Left;    No Known Allergies  Outpatient Encounter Medications as of 10/15/2023  Medication Sig   acetaminophen (TYLENOL) 325 MG tablet Take 650 mg by mouth every 8 (eight) hours as needed.   acetaminophen (TYLENOL) 325 MG tablet Take 650 mg by mouth daily.   amiodarone (PACERONE) 200 MG tablet Take 200 mg by mouth daily.   amoxicillin (AMOXIL) 500 MG tablet Take 2,000 mg by mouth once as needed. Take one hour prior to dental procedures   apixaban (ELIQUIS) 2.5 MG TABS tablet Take 1 tablet (2.5 mg total) by mouth 2 (two) times daily.   atorvastatin (LIPITOR) 10 MG tablet Take 10 mg by mouth daily.   cetirizine (ZYRTEC) 10 MG tablet Take 1 tablet (10 mg total) by mouth at bedtime.   cyanocobalamin 1000 MCG tablet Take 1,000 mcg by mouth every morning.   diltiazem (CARDIZEM CD) 120 MG 24 hr capsule Take 120 mg by mouth daily.   docusate sodium (COLACE) 100 MG capsule Take 1 capsule (100 mg total) by mouth 2 (two) times daily.   furosemide (LASIX) 20 MG tablet Take 20 mg by mouth daily.   magnesium hydroxide (MILK OF MAGNESIA) 400 MG/5ML suspension Take 30 mLs by mouth daily as needed for mild constipation.   melatonin 5 MG TABS Take 5 mg by mouth at bedtime as needed.   mirtazapine (REMERON) 15 MG tablet Take 15 mg by mouth at bedtime.   Multiple Vitamins-Minerals (PRESERVISION AREDS 2+MULTI VIT PO) Take 1 capsule by mouth in the morning and at bedtime.   pantoprazole (PROTONIX) 40 MG tablet Take 1 tablet (40 mg total) by mouth daily.   polyethylene glycol (MIRALAX / GLYCOLAX) 17 g packet Take 17 g by mouth daily as needed.   predniSONE (DELTASONE) 5 MG tablet Take 5 mg by mouth daily with breakfast.   senna-docusate (SENOKOT-S) 8.6-50 MG tablet Take 2 tablets by mouth daily.   tamsulosin (FLOMAX)  0.4 MG CAPS capsule Take 0.4 mg by mouth daily.   timolol (TIMOPTIC) 0.5 % ophthalmic solution Place 1 drop into both eyes daily.   traZODone (DESYREL) 50 MG tablet Take 50 mg by mouth daily.   zinc oxide 20 % ointment Apply 1 application topically as needed for irritation.   No facility-administered encounter medications on file as of 10/15/2023.    Review of Systems  Constitutional:  Positive for activity change. Negative for appetite change.  Respiratory:  Negative for shortness of breath.   Cardiovascular:  Negative for chest pain and leg swelling.  Gastrointestinal:  Negative for abdominal distention, abdominal pain and constipation.  Genitourinary:  Negative for dysuria and flank pain.  Musculoskeletal:  Positive for back pain and gait problem. Negative for myalgias.  Neurological:  Positive for  weakness. Negative for numbness.  Psychiatric/Behavioral:  Negative for dysphoric mood. The patient is not nervous/anxious.     Immunization History  Administered Date(s) Administered   Fluad Quad(high Dose 65+) 09/06/2022   Influenza, High Dose Seasonal PF 08/07/2016   Influenza-Unspecified 08/04/2020, 09/06/2021   Moderna SARS-COV2 Booster Vaccination 09/09/2020   Moderna Sars-Covid-2 Vaccination 11/18/2019, 12/15/2019, 09/19/2022   PFIZER(Purple Top)SARS-COV-2 Vaccination 08/03/2021   PNEUMOCOCCAL CONJUGATE-20 02/10/2022   Pfizer Covid-19 Vaccine Bivalent Booster 68yrs & up 04/26/2022   Pneumococcal-Unspecified 09/13/2020   Tdap 02/10/2022   Pertinent  Health Maintenance Due  Topic Date Due   INFLUENZA VACCINE  06/14/2023      10/08/2021    7:26 AM 12/23/2021   12:37 PM 10/27/2022   11:09 AM 01/19/2023    2:15 PM 05/04/2023    3:25 PM  Fall Risk  Falls in the past year?  1 0 1 0  Was there an injury with Fall?  1 0 0 0  Fall Risk Category Calculator  3 0 1 0  Fall Risk Category (Retired)  High Low    (RETIRED) Patient Fall Risk Level High fall risk High fall risk High fall  risk    Patient at Risk for Falls Due to  History of fall(s);Impaired balance/gait;Impaired mobility;Orthopedic patient History of fall(s) History of fall(s);Impaired mobility History of fall(s);Impaired balance/gait;Impaired mobility  Fall risk Follow up  Falls evaluation completed;Education provided;Falls prevention discussed Falls evaluation completed Falls evaluation completed;Falls prevention discussed;Education provided Falls evaluation completed;Education provided;Falls prevention discussed   Functional Status Survey:    Vitals:   10/15/23 1831  BP: 128/68  Pulse: 86  Resp: 16  Temp: 97.6 F (36.4 C)  SpO2: 94%  Weight: 171 lb (77.6 kg)  Height: 5\' 6"  (1.676 m)   Body mass index is 27.6 kg/m. Physical Exam Vitals reviewed.  Constitutional:      General: He is not in acute distress. HENT:     Head: Normocephalic.  Eyes:     General:        Right eye: No discharge.        Left eye: No discharge.  Cardiovascular:     Rate and Rhythm: Normal rate. Rhythm irregular.     Pulses: Normal pulses.     Heart sounds: Normal heart sounds.  Pulmonary:     Effort: Pulmonary effort is normal. No respiratory distress.     Breath sounds: Normal breath sounds. No wheezing or rales.  Abdominal:     General: Bowel sounds are normal. There is no distension.     Palpations: Abdomen is soft.     Tenderness: There is no abdominal tenderness. There is no right CVA tenderness or left CVA tenderness.  Musculoskeletal:     Cervical back: Neck supple.     Right lower leg: No edema.     Left lower leg: No edema.     Comments: Exam limited due to pain  Skin:    General: Skin is warm.  Neurological:     General: No focal deficit present.     Mental Status: He is alert and oriented to person, place, and time.     Motor: Weakness present.     Gait: Gait abnormal.  Psychiatric:        Mood and Affect: Mood normal.     Labs reviewed: Recent Labs    01/24/23 0000 05/07/23 0000  NA 143  143  K 4.3 4.0  CL 108 108  CO2 26* 25*  BUN 22*  20  CREATININE 1.3 1.4*  CALCIUM 8.5* 8.6*   Recent Labs    01/24/23 0000 05/07/23 0000  AST 24 22  ALT 27 24  ALKPHOS 74 72  ALBUMIN 3.4* 3.4*   Recent Labs    01/24/23 0000 05/07/23 0000  WBC 6.6 8.3  NEUTROABS 3,802.00 4,598.00  HGB 12.0* 11.6*  HCT 37* 36*  PLT 169 164   Lab Results  Component Value Date   TSH 2.55 05/07/2023   No results found for: "HGBA1C" Lab Results  Component Value Date   CHOL 186 01/24/2023   HDL 73 (A) 01/24/2023   LDLCALC 94 01/24/2023   TRIG 96 01/24/2023    Significant Diagnostic Results in last 30 days:  No results found.  Assessment/Plan 1. Acute midline low back pain without sciatica - began 11/28 - on recent fall or injury - tenderness to middle back, no radiation or loss of B&B - suspect worsening stenosis - xray toracic and lumbar spine - start tramadol x 7 days - start lidocaine patch x 14 days - discussed light walking to prevent weakness - consider trying gabapentin if Tramadol ineffective  - acetaminophen (TYLENOL) 500 MG tablet; Take 2 tablets (1,000 mg total) by mouth 3 (three) times daily as needed. - traMADol (ULTRAM) 50 MG tablet; Take 0.5 tablets (25 mg total) by mouth 3 (three) times daily as needed for up to 7 days. - lidocaine 4 %; Place 1 patch onto the skin daily for 14 days. Apply to middle back    Family/ staff Communication: plan discussed with patient and nurse  Labs/tests ordered:  xray thoracic and lumbar spine

## 2023-10-16 ENCOUNTER — Other Ambulatory Visit: Payer: Self-pay | Admitting: Orthopedic Surgery

## 2023-10-16 DIAGNOSIS — M545 Low back pain, unspecified: Secondary | ICD-10-CM

## 2023-10-16 MED ORDER — TRAMADOL HCL 50 MG PO TABS
25.0000 mg | ORAL_TABLET | Freq: Three times a day (TID) | ORAL | 0 refills | Status: DC | PRN
Start: 2023-10-16 — End: 2023-10-17

## 2023-10-16 MED ORDER — TRAMADOL HCL 50 MG PO TABS: 25.0000 mg | ORAL_TABLET | Freq: Three times a day (TID) | ORAL | Status: DC | PRN

## 2023-10-17 ENCOUNTER — Non-Acute Institutional Stay: Payer: Medicare Other | Admitting: Orthopedic Surgery

## 2023-10-17 ENCOUNTER — Encounter: Payer: Self-pay | Admitting: Orthopedic Surgery

## 2023-10-17 DIAGNOSIS — K5901 Slow transit constipation: Secondary | ICD-10-CM | POA: Diagnosis not present

## 2023-10-17 DIAGNOSIS — M545 Low back pain, unspecified: Secondary | ICD-10-CM

## 2023-10-17 MED ORDER — TRAMADOL HCL 50 MG PO TABS: 25.0000 mg | ORAL_TABLET | Freq: Every day | ORAL | Status: DC | PRN

## 2023-10-17 MED ORDER — POLYETHYLENE GLYCOL 3350 17 GM/SCOOP PO POWD
17.0000 g | Freq: Every day | ORAL | Status: DC
Start: 2023-10-20 — End: 2023-11-09

## 2023-10-17 MED ORDER — TRAMADOL HCL 50 MG PO TABS: 25.0000 mg | ORAL_TABLET | Freq: Two times a day (BID) | ORAL | Status: DC

## 2023-10-17 NOTE — Progress Notes (Signed)
Location:  [facility] Room Number: 37/A Place of Service:  ALF [phone]) Provider:  Octavia Heir, NP   Mahlon Gammon, MD  Patient Care Team: Mahlon Gammon, MD as PCP - General (Internal Medicine) Janalyn Harder, MD (Inactive) as Consulting Physician (Dermatology)  Extended Emergency Contact Information Primary Emergency Contact: Georga Hacking Address: [address]          [place], [place] [address] [place] Home Phone: [phone] Work Phone: [phone] Mobile Phone: [phone] Relation: Son Secondary Emergency Contact: Burgueno,Chris Mobile Phone: [phone] Relation: Son Preferred language: English  Code Status:  DNR Goals of care: Advanced Directive information    06/12/2023   10:35 AM  Advanced Directives  Does Patient Have a Medical Advance Directive? Yes  Type of Estate agent of [place];Out of facility DNR (pink MOST or yellow form);Living will  Does patient want to make changes to medical advance directive? No - Patient declined  Copy of Healthcare Power of Attorney in Chart? Yes - validated most recent copy scanned in chart (See row information)  Pre-existing out of facility DNR order (yellow form or pink MOST form) Yellow form placed in chart (order not valid for inpatient use)     Chief Complaint  Patient presents with   Acute Visit    Back pain, constipation    HPI:  Pt is a 89 y.o. male seen today for acute visit due to ongoing back pain.   He currently resides on the assisted living unit at [facility]. PMH: GERD, constipation, HTN, afib, tachy-brady syndrome s/p pacemaker, left hip fracture s/p LATH 09/2021 , CKD, anemia, BPH, insomnia, depression.     "H/o chronic back pain. CT spine noted moderate degenerative disc disease C5-C6 and C6-C7 in 2021. CT lumbar spine noted stenosis to L4-L5, L5-S1, narrowing to L3-L4 in 2018. 11/28 He began to have increased middle back pain. No  recent fall or injury. 11/29 he was started on prednisone taper per on call provider. Today, he reports no improvement with increased prednisone. Middle back pain described as "sharp", increased with movement, no radiation. Denies loss of B&B. He has also been given tylenol 650 mg prn. He has not been getting OOB and using urinal. Afebrile. Vitals stable."  12/02 xray thoracic and lumbar spine negative for acute abnormalities. Evaluated by OT yesterday. Back brace did not fit. Family has brought new one today. Family also brought new recliner. Tramadol effective in reducing pain. He is asking to have it scheduled for a few days. Lidocaine patch to back also helping. He was able to get dressed on his own and have lunch in dining room today.   No BM in 5 days per patient. He was given miralax this morning without relief. Denies abdominal pain, N/V and diarrhea.    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   History of kidney stones    Hypertension    Kidney stone    Presence of permanent cardiac pacemaker 06/03/2020   SCC (squamous cell carcinoma) 07/17/2008  SCC WELL DIFF (RIGHT HELICAL MARGIN)(MOHS)   SCC (squamous cell carcinoma) 03/24/2013   SCC IN SITU (BACK SCALP)(TX AFTER BX)   SCC (squamous cell carcinoma) 03/24/2013   SCC IN SITU (RIGHT SCALP)(TX AFTER BX)   SCC (squamous cell carcinoma) 03/24/2013   SCC IN SITU (MID SCALP)(TX AFTER BX)   SCC (squamous cell carcinoma) 03/24/2013   SCC IN SITU (MID NECK)(TX AFTER BX)   Spinal stenosis    Squamous cell carcinoma of skin 11/25/1997   ant right scalp    Tachy-brady syndrome (HCC)  06/03/2020   Past Surgical History:  Procedure Laterality Date   addnoid     CYSTOSCOPY W/ URETEROSCOPY  1980   INSERT / REPLACE / REMOVE PACEMAKER  06/03/2020   KNEE SURGERY     NAILBED REPAIR Left 06/27/2016   Procedure: NAILBED biopsy left thumb;  Surgeon: Cindee Salt, MD;  Location: [facility];  Service: Orthopedics;  Laterality: Left;  FAB   PACEMAKER IMPLANT N/A 06/03/2020   Procedure: PACEMAKER IMPLANT;  Surgeon: Regan Lemming, MD;  Location: [facility];  Service: Cardiovascular;  Laterality: N/A;   TONSILLECTOMY     TOTAL HIP ARTHROPLASTY Left 10/04/2021   Procedure: TOTAL HIP ARTHROPLASTY ANTERIOR APPROACH;  Surgeon: Durene Romans, MD;  Location: [facility];  Service: Orthopedics;  Laterality: Left;    No Known Allergies  Outpatient Encounter Medications as of 10/17/2023  Medication Sig   acetaminophen (TYLENOL) 325 MG tablet Take 650 mg by mouth daily.   acetaminophen (TYLENOL) 500 MG tablet Take 2 tablets (1,000 mg total) by mouth 3 (three) times daily as needed.   amiodarone (PACERONE) 200 MG tablet Take 200 mg by mouth daily.   amoxicillin (AMOXIL) 500 MG tablet Take 2,000 mg by mouth once as needed. Take one hour prior to dental procedures   apixaban (ELIQUIS) 2.5 MG TABS tablet Take 1 tablet (2.5 mg total) by mouth 2 (two) times daily.   atorvastatin (LIPITOR) 10 MG tablet Take 10 mg by mouth daily.   cetirizine (ZYRTEC) 10 MG tablet Take 1 tablet (10 mg total) by mouth at bedtime.   cyanocobalamin 1000 MCG tablet Take 1,000 mcg by mouth every morning.   diltiazem (CARDIZEM CD) 120 MG 24 hr capsule Take 120 mg by mouth daily.   docusate sodium (COLACE) 100 MG capsule Take 1 capsule (100 mg total) by mouth 2 (two) times daily.   furosemide (LASIX) 20 MG tablet Take 20 mg by mouth daily.   lidocaine 4 % Place 1 patch onto the skin daily for 14 days. Apply to middle back   magnesium hydroxide (MILK OF MAGNESIA) 400 MG/5ML suspension Take 30 mLs by  mouth daily as needed for mild constipation.   melatonin 5 MG TABS Take 5 mg by mouth at bedtime as needed.   mirtazapine (REMERON) 15 MG tablet Take 15 mg by mouth at bedtime.   Multiple Vitamins-Minerals (PRESERVISION AREDS 2+MULTI VIT PO) Take 1 capsule by mouth in the morning and at bedtime.   pantoprazole (PROTONIX) 40 MG tablet Take 1 tablet (40 mg total) by mouth daily.   polyethylene glycol (MIRALAX / GLYCOLAX) 17 g packet Take 17 g by mouth daily as needed.   predniSONE (DELTASONE) 5 MG tablet Take 5 mg by mouth daily with breakfast.   senna-docusate (SENOKOT-S) 8.6-50 MG tablet Take 2 tablets by mouth daily.   tamsulosin (FLOMAX) 0.4 MG CAPS capsule Take 0.4 mg by mouth daily.   timolol (TIMOPTIC) 0.5 % ophthalmic solution  Place 1 drop into both eyes daily.   traMADol (ULTRAM) 50 MG tablet Take 0.5 tablets (25 mg total) by mouth 3 (three) times daily as needed for up to 7 days.   traZODone (DESYREL) 50 MG tablet Take 50 mg by mouth daily.   zinc oxide 20 % ointment Apply 1 application topically as needed for irritation.   No facility-administered encounter medications on file as of 10/17/2023.    Review of Systems  Constitutional:  Positive for activity change. Negative for appetite change.  Respiratory:  Negative for shortness of breath.   Cardiovascular:  Negative for chest pain.  Gastrointestinal:  Positive for abdominal distention and constipation. Negative for abdominal pain, diarrhea, nausea and vomiting.  Musculoskeletal:  Positive for back pain and gait problem. Negative for myalgias.  Psychiatric/Behavioral:  Negative for dysphoric mood. The patient is not nervous/anxious.     Immunization History  Administered Date(s) Administered   Fluad Quad(high Dose 65+) 09/06/2022, 09/27/2023   Influenza, High Dose Seasonal PF 08/07/2016   Influenza-Unspecified 08/04/2020, 09/06/2021   Moderna SARS-COV2 Booster Vaccination 09/09/2020   Moderna Sars-Covid-2 Vaccination  11/18/2019, 12/15/2019, 09/19/2022   PFIZER(Purple Top)SARS-COV-2 Vaccination 08/03/2021   PNEUMOCOCCAL CONJUGATE-20 02/10/2022   Pfizer Covid-19 Vaccine Bivalent Booster 1yrs & up 04/26/2022   Pneumococcal-Unspecified 09/13/2020   Tdap 02/10/2022   Pertinent  Health Maintenance Due  Topic Date Due   INFLUENZA VACCINE  Completed      12/23/2021   12:37 PM 10/27/2022   11:09 AM 01/19/2023    2:15 PM 05/04/2023    3:25 PM 10/15/2023    6:50 PM  Fall Risk  Falls in the past year? 1 0 1 0 0  Was there an injury with Fall? 1 0 0 0 0  Fall Risk Category Calculator 3 0 1 0 0  Fall Risk Category (Retired) High Low     (RETIRED) Patient Fall Risk Level High fall risk High fall risk     Patient at Risk for Falls Due to History of fall(s);Impaired balance/gait;Impaired mobility;Orthopedic patient History of fall(s) History of fall(s);Impaired mobility History of fall(s);Impaired balance/gait;Impaired mobility History of fall(s);Impaired balance/gait;Impaired mobility  Fall risk Follow up Falls evaluation completed;Education provided;Falls prevention discussed Falls evaluation completed Falls evaluation completed;Falls prevention discussed;Education provided Falls evaluation completed;Education provided;Falls prevention discussed Falls evaluation completed;Education provided;Falls prevention discussed   Functional Status Survey:    Vitals:   10/17/23 1628  BP: 128/68  Pulse: 86  Resp: 16  Temp: 97.6 F (36.4 C)  SpO2: 94%  Weight: 171 lb (77.6 kg)  Height: 5\' 6"  (1.676 m)   Body mass index is 27.6 kg/m. Physical Exam Vitals reviewed.  Constitutional:      General: He is not in acute distress. HENT:     Head: Normocephalic.  Eyes:     General:        Right eye: No discharge.        Left eye: No discharge.  Cardiovascular:     Rate and Rhythm: Normal rate. Rhythm irregular.     Pulses: Normal pulses.     Heart sounds: Normal heart sounds.  Pulmonary:     Effort: Pulmonary  effort is normal.     Breath sounds: Normal breath sounds.  Abdominal:     General: There is no distension.     Palpations: Abdomen is soft.     Tenderness: There is no abdominal tenderness.     Comments: Hypoactive bowel sounds x 4  Musculoskeletal:     Cervical back:  Neck supple.     Right lower leg: No edema.     Left lower leg: No edema.  Skin:    General: Skin is warm.     Capillary Refill: Capillary refill takes less than 2 seconds.  Neurological:     General: No focal deficit present.     Mental Status: He is alert and oriented to person, place, and time.     Motor: Weakness present.     Gait: Gait abnormal.     Comments: wheelchair  Psychiatric:        Mood and Affect: Mood normal.     Labs reviewed: Recent Labs    01/24/23 0000 05/07/23 0000  NA 143 143  K 4.3 4.0  CL 108 108  CO2 26* 25*  BUN 22* 20  CREATININE 1.3 1.4*  CALCIUM 8.5* 8.6*   Recent Labs    01/24/23 0000 05/07/23 0000  AST 24 22  ALT 27 24  ALKPHOS 74 72  ALBUMIN 3.4* 3.4*   Recent Labs    01/24/23 0000 05/07/23 0000  WBC 6.6 8.3  NEUTROABS 3,802.00 4,598.00  HGB 12.0* 11.6*  HCT 37* 36*  PLT 169 164   Lab Results  Component Value Date   TSH 2.55 05/07/2023   No results found for: "HGBA1C" Lab Results  Component Value Date   CHOL 186 01/24/2023   HDL 73 (A) 01/24/2023   LDLCALC 94 01/24/2023   TRIG 96 01/24/2023    Significant Diagnostic Results in last 30 days:  No results found.  Assessment/Plan 1. Acute midline low back pain without sciatica - improving with Tramadol and lidocaine patches - ? constipation contributing to increased pain - OT evaluation 12/03> trying back brace - also got new recliner - advised to promote light walking after receiving Tramadol - avoid bending, lifting, pulling mechanics - will schedule tramadol BID x 1 week - traMADol (ULTRAM) 50 MG tablet; Take 0.5 tablets (25 mg total) by mouth 2 (two) times daily for 7 days. - traMADol  (ULTRAM) 50 MG tablet; Take 0.5 tablets (25 mg total) by mouth daily as needed.  2. Slow transit constipation - no BM in 5 days per patient - hypoactive bowel sounds - start miralax po BID x 2 days, then daily  - encourage hydration and movement - consider KUB if no BM  - cont senna - polyethylene glycol powder (GLYCOLAX/MIRALAX) 17 GM/SCOOP powder; Take 17 g by mouth daily.    Family/ staff Communication: plan discussed with patient and nursing   Labs/tests ordered:  none

## 2023-10-18 ENCOUNTER — Non-Acute Institutional Stay: Payer: Medicare Other | Admitting: Internal Medicine

## 2023-10-18 DIAGNOSIS — M545 Low back pain, unspecified: Secondary | ICD-10-CM

## 2023-10-18 DIAGNOSIS — K5901 Slow transit constipation: Secondary | ICD-10-CM | POA: Diagnosis not present

## 2023-10-18 MED ORDER — TRAMADOL HCL 50 MG PO TABS: 50.0000 mg | ORAL_TABLET | Freq: Three times a day (TID) | ORAL | 0 refills | Status: DC

## 2023-10-18 NOTE — Progress Notes (Signed)
[facility]:  ALF (13)  Provider:   Code Status: DNR Goals of Care:     06/12/2023   10:35 AM  Advanced Directives  Does Patient Have a Medical Advance Directive? Yes  Type of Estate agent of [place];Out of facility DNR (pink MOST or yellow form);Living will  Does patient want to make changes to medical advance directive? No - Patient declined  Copy of Healthcare Power of Attorney in Chart? Yes - validated most recent copy scanned in chart (See row information)  Pre-existing out of facility DNR order (yellow form or pink MOST form) Yellow form placed in chart (order not valid for inpatient use)     Chief Complaint  Patient presents with   Acute Visit    HPI: Patient is a 89 y.o. male seen today for an acute visit for Back Pain and Constipation Lives in AL in [facility]   Patient has a history of A. fib on Eliquis, hypertension, hyperlipidemia,   right clavicle and right pubic ramus fracture in 10/21 H/o Tachybrady s/p Pacemaker  Also h/o Chronic Pain in Lower Back  ON Chronic Prednisone per previous PCP H/o Left Hip Arthroplasty i  Patient had covid in 10/24 Since then he has been sitting in his Chair most of the time Started having Midline pain back pain since 12/02  Was started on Prednisone taper Also Tramadol Lidocaine patches  It has helped some but Son wanted to know if he needs to go out to Ortho for Eval Patient also is upset as he has not moved his bowels And Has been constipated for last few days Not eating well this morning.  Denies any nausea or vomiting.  Denies any abdominal pain.    Past Medical History:  Diagnosis Date   Ankle edema    Anxiety    Arthritis    Basal cell carcinoma 11/21/2004   MID FOREHEAD   BCC (basal cell carcinoma of skin) 08/26/2007   (LEFT OUTER, ZYGOMATIC ARCH)(CURET3, EXC)   Dyspnea    Dysrhythmia    a-fib/flutter- on eliquis   GERD (gastroesophageal reflux disease)     no treatment   Hard of hearing    Heart block AV complete (HCC) 07/31/2020   History of kidney stones    Hypertension    Kidney stone    Presence of permanent cardiac pacemaker 06/03/2020   SCC (squamous cell carcinoma) 07/17/2008   SCC WELL DIFF (RIGHT HELICAL MARGIN)(MOHS)   SCC (squamous cell carcinoma) 03/24/2013   SCC IN SITU (BACK SCALP)(TX AFTER BX)   SCC (squamous cell carcinoma) 03/24/2013   SCC IN SITU (RIGHT SCALP)(TX AFTER BX)   SCC (squamous cell carcinoma) 03/24/2013   SCC IN SITU (MID SCALP)(TX AFTER BX)   SCC (squamous cell carcinoma) 03/24/2013   SCC IN SITU (MID NECK)(TX AFTER BX)   Spinal stenosis    Squamous cell carcinoma of skin 11/25/1997   ant right scalp    Tachy-brady syndrome (HCC) 06/03/2020    Past Surgical History:  Procedure Laterality Date   addnoid     CYSTOSCOPY W/ URETEROSCOPY  1980   INSERT / REPLACE / REMOVE PACEMAKER  06/03/2020   KNEE SURGERY     NAILBED REPAIR Left 06/27/2016   Procedure: NAILBED biopsy left thumb;  Surgeon: Cindee Salt, MD;  Location: [facility];  Service: Orthopedics;  Laterality: Left;  FAB   PACEMAKER IMPLANT N/A 06/03/2020   Procedure: PACEMAKER IMPLANT;  Surgeon:  Regan Lemming, MD;  Location: [facility];  Service: Cardiovascular;  Laterality: N/A;   TONSILLECTOMY     TOTAL HIP ARTHROPLASTY Left 10/04/2021   Procedure: TOTAL HIP ARTHROPLASTY ANTERIOR APPROACH;  Surgeon: Durene Romans, MD;  Location: [facility];  Service: Orthopedics;  Laterality: Left;    No Known Allergies  Outpatient Encounter Medications as of 10/18/2023  Medication Sig   methocarbamol (ROBAXIN) 500 MG tablet Take 250 mg by mouth every 6 (six) hours as needed for muscle spasms.   traMADol (ULTRAM) 50 MG tablet Take 1 tablet (50 mg total) by mouth every 8 (eight) hours.   traMADol (ULTRAM) 50 MG tablet Take 50 mg by mouth every 6 (six) hours as needed for severe pain (pain score 7-10).   acetaminophen (TYLENOL) 325 MG  tablet Take 650 mg by mouth daily.   acetaminophen (TYLENOL) 500 MG tablet Take 2 tablets (1,000 mg total) by mouth 3 (three) times daily as needed.   amiodarone (PACERONE) 200 MG tablet Take 200 mg by mouth daily.   amoxicillin (AMOXIL) 500 MG tablet Take 2,000 mg by mouth once as needed. Take one hour prior to dental procedures   apixaban (ELIQUIS) 2.5 MG TABS tablet Take 1 tablet (2.5 mg total) by mouth 2 (two) times daily.   atorvastatin (LIPITOR) 10 MG tablet Take 10 mg by mouth daily.   cetirizine (ZYRTEC) 10 MG tablet Take 1 tablet (10 mg total) by mouth at bedtime.   cyanocobalamin 1000 MCG tablet Take 1,000 mcg by mouth every morning.   diltiazem (CARDIZEM CD) 120 MG 24 hr capsule Take 120 mg by mouth daily.   docusate sodium (COLACE) 100 MG capsule Take 1 capsule (100 mg total) by mouth 2 (two) times daily.   furosemide (LASIX) 20 MG tablet Take 20 mg by mouth daily.   lidocaine 4 % Place 1 patch onto the skin daily for 14 days. Apply to middle back   magnesium hydroxide (MILK OF MAGNESIA) 400 MG/5ML suspension Take 30 mLs by mouth daily as needed for mild constipation.   melatonin 5 MG TABS Take 5 mg by mouth at bedtime as needed.   mirtazapine (REMERON) 15 MG tablet Take 15 mg by mouth at bedtime.   Multiple Vitamins-Minerals (PRESERVISION AREDS 2+MULTI VIT PO) Take 1 capsule by mouth in the morning and at bedtime.   pantoprazole (PROTONIX) 40 MG tablet Take 1 tablet (40 mg total) by mouth daily.   polyethylene glycol (MIRALAX / GLYCOLAX) 17 g packet Take 17 g by mouth 2 (two) times daily.   [START ON 10/20/2023] polyethylene glycol powder (GLYCOLAX/MIRALAX) 17 GM/SCOOP powder Take 17 g by mouth daily.   predniSONE (DELTASONE) 5 MG tablet Take 5 mg by mouth daily with breakfast.   senna-docusate (SENOKOT-S) 8.6-50 MG tablet Take 2 tablets by mouth daily.   tamsulosin (FLOMAX) 0.4 MG CAPS capsule Take 0.4 mg by mouth daily.   timolol (TIMOPTIC) 0.5 % ophthalmic solution Place 1 drop  into both eyes daily.   traZODone (DESYREL) 50 MG tablet Take 50 mg by mouth daily.   zinc oxide 20 % ointment Apply 1 application topically as needed for irritation.   [DISCONTINUED] traMADol (ULTRAM) 50 MG tablet Take 0.5 tablets (25 mg total) by mouth 2 (two) times daily for 7 days.   [DISCONTINUED] traMADol (ULTRAM) 50 MG tablet Take 0.5 tablets (25 mg total) by mouth daily as needed.   No facility-administered encounter medications on file as of 10/18/2023.    Review of Systems:  Review of Systems  Constitutional:  Positive for activity change and appetite change. Negative for unexpected weight change.  HENT: Negative.    Respiratory:  Negative for cough and shortness of breath.   Cardiovascular:  Negative for leg swelling.  Gastrointestinal:  Positive for constipation.  Genitourinary:  Negative for frequency.  Musculoskeletal:  Positive for arthralgias, back pain, gait problem and myalgias.  Skin: Negative.  Negative for rash.  Neurological:  Negative for dizziness and weakness.  Psychiatric/Behavioral:  Negative for confusion and sleep disturbance.   All other systems reviewed and are negative.   Health Maintenance  Topic Date Due   Zoster Vaccines- Shingrix (1 of 2) Never done   COVID-19 Vaccine (6 - 2023-24 season) 07/15/2023   Medicare Annual Wellness (AWV)  01/19/2024   DTaP/Tdap/Td (2 - Td or Tdap) 02/11/2032   Pneumonia Vaccine 2+ Years old  Completed   INFLUENZA VACCINE  Completed   HPV VACCINES  Aged Out    Physical Exam: Vitals:   10/19/23 1248  BP: (!) 164/90  Pulse: 60  Resp: 13  Temp: (!) 97.1 F (36.2 C)   There is no height or weight on file to calculate BMI. Physical Exam Vitals reviewed.  Constitutional:      Appearance: Normal appearance.  HENT:     Head: Normocephalic.     Nose: Nose normal.     Mouth/Throat:     Mouth: Mucous membranes are moist.     Pharynx: Oropharynx is clear.  Eyes:     Pupils: Pupils are equal, round, and reactive  to light.  Cardiovascular:     Rate and Rhythm: Normal rate and regular rhythm.     Pulses: Normal pulses.     Heart sounds: No murmur heard. Pulmonary:     Effort: Pulmonary effort is normal. No respiratory distress.     Breath sounds: Normal breath sounds. No rales.  Abdominal:     General: Abdomen is flat. Bowel sounds are normal.     Palpations: Abdomen is soft.  Musculoskeletal:        General: Swelling present.     Cervical back: Neck supple.  Skin:    General: Skin is warm.  Neurological:     General: No focal deficit present.     Mental Status: He is alert and oriented to person, place, and time.     Comments: Pain Mostly in Lower Thoracic area Para Spinal Tender in small area Was able to get up and walk with his walker  Psychiatric:        Mood and Affect: Mood normal.        Thought Content: Thought content normal.     Labs reviewed: Basic Metabolic Panel: Recent Labs    01/24/23 0000 05/07/23 0000  NA 143 143  K 4.3 4.0  CL 108 108  CO2 26* 25*  BUN 22* 20  CREATININE 1.3 1.4*  CALCIUM 8.5* 8.6*  TSH 2.78 2.55   Liver Function Tests: Recent Labs    01/24/23 0000 05/07/23 0000  AST 24 22  ALT 27 24  ALKPHOS 74 72  ALBUMIN 3.4* 3.4*   No results for input(s): "LIPASE", "AMYLASE" in the last 8760 hours. No results for input(s): "AMMONIA" in the last 8760 hours. CBC: Recent Labs    01/24/23 0000 05/07/23 0000  WBC 6.6 8.3  NEUTROABS 3,802.00 4,598.00  HGB 12.0* 11.6*  HCT 37* 36*  PLT 169 164   Lipid Panel: Recent Labs    01/24/23 0000  CHOL 186  HDL 73*  LDLCALC 94  TRIG 96   No results found for: "HGBA1C"  Procedures since last visit: No results found.  Assessment/Plan 1. Acute right-sided low back pain without sciatica Seems Muscular Xrays Negative for any compression Fractures Will schedule Tramadol 50 mg TID for pain Can also use Tylenol and Tramadol Prn Also added Robaxin 250 mg Q 8 hours Therapy to work with him Will  wait for Ortho till next week if pain still persists Continue Lidocaine patch 2. Slow transit constipation Fleets One time On Miralax BID Also on Senna  D/W the Son   Labs/tests ordered:  CBC,CMP in am Next appt:  Visit date not found

## 2023-10-19 ENCOUNTER — Encounter: Payer: Self-pay | Admitting: Orthopedic Surgery

## 2023-10-19 ENCOUNTER — Encounter: Payer: Self-pay | Admitting: Internal Medicine

## 2023-10-19 ENCOUNTER — Non-Acute Institutional Stay: Payer: Medicare Other | Admitting: Orthopedic Surgery

## 2023-10-19 DIAGNOSIS — K5901 Slow transit constipation: Secondary | ICD-10-CM | POA: Diagnosis not present

## 2023-10-19 DIAGNOSIS — M545 Low back pain, unspecified: Secondary | ICD-10-CM

## 2023-10-19 NOTE — Progress Notes (Signed)
Location:  [facility] Room Number: 37/A Place of Service:  ALF [phone]) Provider:  Octavia Heir, NP   Mahlon Gammon, MD  Patient Care Team: Mahlon Gammon, MD as PCP - General (Internal Medicine) Janalyn Harder, MD (Inactive) as Consulting Physician (Dermatology)  Extended Emergency Contact Information Primary Emergency Contact: Georga Hacking Address: [address]          [place], [place] [address] Darden [place] of [place] Home Phone: [phone] Work Phone: [phone] Mobile Phone: [phone] Relation: Son Secondary Emergency Contact: Deming,Chris Mobile Phone: [phone] Relation: Son Preferred language: English  Code Status:  DNR Goals of care: Advanced Directive information    06/12/2023   10:35 AM  Advanced Directives  Does Patient Have a Medical Advance Directive? Yes  Type of Estate agent of [place];Out of facility DNR (pink MOST or yellow form);Living will  Does patient want to make changes to medical advance directive? No - Patient declined  Copy of Healthcare Power of Attorney in Chart? Yes - validated most recent copy scanned in chart (See row information)  Pre-existing out of facility DNR order (yellow form or pink MOST form) Yellow form placed in chart (order not valid for inpatient use)     Chief Complaint  Patient presents with   Acute Visit    constipation    HPI:  Pt is a 89 y.o. male seen today for acute visit due to ongoing constipation.   He currently resides on the assisted living unit at [facility]. PMH: GERD, constipation, HTN, afib, tachy-brady syndrome s/p pacemaker, left hip fracture s/p LATH 09/2021 , CKD, anemia, BPH, insomnia, depression.    12/04 he was started on miralax BID x 2 days due to constipation. No BM in 5 days. 12/05 no BM and fleet enema was ordered. Today, no BM. Nursing checked for impaction and did not feel anything. He denies abdominal pain, N/V or diarrhea. He  has also had increased back pain x 1 week. He has been given prednisone taper, tramadol, tylenol, lidocaine patches and robaxin with some relief. He is not moving a lot at this time. Appetite has also been poor the last few days. Afebrile. Vitals stable.    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   History of kidney stones    Hypertension    Kidney stone    Presence of permanent cardiac pacemaker 06/03/2020   SCC (squamous cell carcinoma) 07/17/2008   SCC WELL DIFF (RIGHT HELICAL MARGIN)(MOHS)   SCC (squamous cell carcinoma) 03/24/2013   SCC IN SITU (BACK SCALP)(TX AFTER BX)   SCC (squamous cell carcinoma) 03/24/2013   SCC IN SITU (RIGHT SCALP)(TX AFTER BX)   SCC (squamous cell carcinoma) 03/24/2013   SCC IN SITU (MID SCALP)(TX AFTER BX)   SCC (squamous cell carcinoma) 03/24/2013   SCC IN SITU (MID NECK)(TX AFTER BX)   Spinal stenosis    Squamous cell carcinoma of skin 11/25/1997   ant right scalp    Tachy-brady syndrome (HCC) 06/03/2020   Past Surgical History:  Procedure Laterality Date   addnoid     CYSTOSCOPY W/ URETEROSCOPY  1980   INSERT / REPLACE / REMOVE PACEMAKER  06/03/2020   KNEE SURGERY     NAILBED REPAIR Left 06/27/2016   Procedure: NAILBED biopsy left thumb;  Surgeon: Cindee Salt, MD;  Location: [facility];  Service: Orthopedics;  Laterality: Left;  FAB   PACEMAKER IMPLANT N/A 06/03/2020   Procedure: PACEMAKER IMPLANT;  Surgeon: Regan Lemming, MD;  Location: [facility];  Service: Cardiovascular;  Laterality: N/A;   TONSILLECTOMY     TOTAL HIP ARTHROPLASTY Left 10/04/2021    Procedure: TOTAL HIP ARTHROPLASTY ANTERIOR APPROACH;  Surgeon: Durene Romans, MD;  Location: [facility];  Service: Orthopedics;  Laterality: Left;    No Known Allergies  Outpatient Encounter Medications as of 10/19/2023  Medication Sig   acetaminophen (TYLENOL) 325 MG tablet Take 650 mg by mouth daily.   acetaminophen (TYLENOL) 500 MG tablet Take 2 tablets (1,000 mg total) by mouth 3 (three) times daily as needed.   amiodarone (PACERONE) 200 MG tablet Take 200 mg by mouth daily.   amoxicillin (AMOXIL) 500 MG tablet Take 2,000 mg by mouth once as needed. Take one hour prior to dental procedures   apixaban (ELIQUIS) 2.5 MG TABS tablet Take 1 tablet (2.5 mg total) by mouth 2 (two) times daily.   atorvastatin (LIPITOR) 10 MG tablet Take 10 mg by mouth daily.   cetirizine (ZYRTEC) 10 MG tablet Take 1 tablet (10 mg total) by mouth at bedtime.   cyanocobalamin 1000 MCG tablet Take 1,000 mcg by mouth every morning.   diltiazem (CARDIZEM CD) 120 MG 24 hr capsule Take 120 mg by mouth daily.   docusate sodium (COLACE) 100 MG capsule Take 1 capsule (100 mg total) by mouth 2 (two) times daily.   furosemide (LASIX) 20 MG tablet Take 20 mg by mouth daily.   lidocaine 4 % Place 1 patch onto the skin daily for 14 days. Apply to middle back   magnesium hydroxide (MILK OF MAGNESIA) 400 MG/5ML suspension Take 30 mLs by mouth daily as needed for mild constipation.   melatonin 5 MG TABS Take 5 mg by mouth at bedtime as needed.   methocarbamol (ROBAXIN) 500 MG tablet Take 250 mg by mouth every 6 (six) hours as needed for muscle spasms.   mirtazapine (REMERON) 15 MG tablet Take 15 mg by mouth at bedtime.   Multiple Vitamins-Minerals (PRESERVISION AREDS 2+MULTI VIT PO) Take 1 capsule by mouth in the morning and at bedtime.   pantoprazole (PROTONIX) 40 MG tablet Take 1 tablet (40 mg total) by mouth daily.   polyethylene glycol (MIRALAX / GLYCOLAX) 17 g packet Take 17 g by mouth 2 (two) times daily.   [START ON 10/20/2023]  polyethylene glycol powder (GLYCOLAX/MIRALAX) 17 GM/SCOOP powder Take 17 g by mouth daily.   predniSONE (DELTASONE) 5 MG tablet Take 5 mg by mouth daily with breakfast.   senna-docusate (SENOKOT-S) 8.6-50 MG tablet Take 2 tablets by mouth daily.   tamsulosin (FLOMAX) 0.4 MG CAPS capsule Take 0.4 mg by mouth daily.   timolol (TIMOPTIC) 0.5 % ophthalmic solution Place 1 drop into both eyes daily.   traMADol (ULTRAM) 50 MG tablet Take 1 tablet (50 mg total) by mouth every 8 (eight) hours.   traMADol (ULTRAM) 50 MG tablet Take 50 mg by mouth every 6 (six) hours as needed for severe pain (pain score 7-10).   traZODone (DESYREL) 50 MG tablet Take 50 mg by  mouth daily.   zinc oxide 20 % ointment Apply 1 application topically as needed for irritation.   No facility-administered encounter medications on file as of 10/19/2023.    Review of Systems  Constitutional:  Positive for activity change and appetite change.  Respiratory:  Negative for cough and shortness of breath.   Cardiovascular:  Negative for chest pain and leg swelling.  Gastrointestinal:  Positive for abdominal distention and constipation. Negative for abdominal pain, diarrhea, nausea and vomiting.  Musculoskeletal:  Positive for arthralgias, back pain and gait problem.  Neurological:  Positive for weakness.  Psychiatric/Behavioral:  Negative for dysphoric mood. The patient is not nervous/anxious.     Immunization History  Administered Date(s) Administered   Fluad Quad(high Dose 65+) 09/06/2022, 09/27/2023   Influenza, High Dose Seasonal PF 08/07/2016   Influenza-Unspecified 08/04/2020, 09/06/2021   Moderna SARS-COV2 Booster Vaccination 09/09/2020   Moderna Sars-Covid-2 Vaccination 11/18/2019, 12/15/2019, 09/19/2022   PFIZER(Purple Top)SARS-COV-2 Vaccination 08/03/2021   PNEUMOCOCCAL CONJUGATE-20 02/10/2022   Pfizer Covid-19 Vaccine Bivalent Booster 3yrs & up 04/26/2022   Pneumococcal-Unspecified 09/13/2020   Tdap 02/10/2022    Pertinent  Health Maintenance Due  Topic Date Due   INFLUENZA VACCINE  Completed      12/23/2021   12:37 PM 10/27/2022   11:09 AM 01/19/2023    2:15 PM 05/04/2023    3:25 PM 10/15/2023    6:50 PM  Fall Risk  Falls in the past year? 1 0 1 0 0  Was there an injury with Fall? 1 0 0 0 0  Fall Risk Category Calculator 3 0 1 0 0  Fall Risk Category (Retired) High Low     (RETIRED) Patient Fall Risk Level High fall risk High fall risk     Patient at Risk for Falls Due to History of fall(s);Impaired balance/gait;Impaired mobility;Orthopedic patient History of fall(s) History of fall(s);Impaired mobility History of fall(s);Impaired balance/gait;Impaired mobility History of fall(s);Impaired balance/gait;Impaired mobility  Fall risk Follow up Falls evaluation completed;Education provided;Falls prevention discussed Falls evaluation completed Falls evaluation completed;Falls prevention discussed;Education provided Falls evaluation completed;Education provided;Falls prevention discussed Falls evaluation completed;Education provided;Falls prevention discussed   Functional Status Survey:    Vitals:   10/19/23 1449  BP: (!) 148/84  Pulse: 60  Resp: 13  Temp: (!) 97.1 F (36.2 C)  SpO2: 98%  Weight: 171 lb (77.6 kg)  Height: 5\' 6"  (1.676 m)   Body mass index is 27.6 kg/m. Physical Exam Vitals reviewed.  Constitutional:      General: He is not in acute distress. HENT:     Head: Normocephalic.  Eyes:     General:        Right eye: No discharge.        Left eye: No discharge.  Cardiovascular:     Rate and Rhythm: Normal rate. Rhythm irregular.     Pulses: Normal pulses.     Heart sounds: Normal heart sounds.  Pulmonary:     Effort: Pulmonary effort is normal.     Breath sounds: Normal breath sounds.  Abdominal:     General: Bowel sounds are normal. There is distension.     Tenderness: There is no abdominal tenderness.  Musculoskeletal:     Cervical back: Neck supple.     Right  lower leg: No edema.     Left lower leg: No edema.  Skin:    General: Skin is warm.     Capillary Refill: Capillary refill takes less than 2 seconds.  Neurological:     General:  No focal deficit present.     Mental Status: He is alert and oriented to person, place, and time.     Motor: Weakness present.     Gait: Gait abnormal.  Psychiatric:        Mood and Affect: Mood normal.     Labs reviewed: Recent Labs    01/24/23 0000 05/07/23 0000  NA 143 143  K 4.3 4.0  CL 108 108  CO2 26* 25*  BUN 22* 20  CREATININE 1.3 1.4*  CALCIUM 8.5* 8.6*   Recent Labs    01/24/23 0000 05/07/23 0000  AST 24 22  ALT 27 24  ALKPHOS 74 72  ALBUMIN 3.4* 3.4*   Recent Labs    01/24/23 0000 05/07/23 0000  WBC 6.6 8.3  NEUTROABS 3,802.00 4,598.00  HGB 12.0* 11.6*  HCT 37* 36*  PLT 169 164   Lab Results  Component Value Date   TSH 2.55 05/07/2023   No results found for: "HGBA1C" Lab Results  Component Value Date   CHOL 186 01/24/2023   HDL 73 (A) 01/24/2023   LDLCALC 94 01/24/2023   TRIG 96 01/24/2023    Significant Diagnostic Results in last 30 days:  No results found.  Assessment/Plan 1. Slow transit constipation - ongoing> no BM in 1 week - unsuccessful trial of miralax BID x 2 days and fleet enema - abdominal distension and increased low back pain> no N/V - negative for impaction per nursing  - will order KUB to r/o ileus - cont daily miralax and senna  2. Acute midline low back pain without sciatica - began 12/02 - xray thoracic and lumbar negative for acute abnormalities - now on Tramadol 50 mg and robaxin - some pain relief - PT/OT ordered to prevent weakness - suspect BM will improve symptoms- see above - cont tylenol and lidocaine patch    Family/ staff Communication: plan discussed with patient and nurse  Labs/tests ordered:  cbc/diff, bmp pending

## 2023-10-20 LAB — CBC: RBC: 3.77 — AB (ref 3.87–5.11)

## 2023-10-20 LAB — BASIC METABOLIC PANEL
BUN: 28 — AB (ref 4–21)
CO2: 27 — AB (ref 13–22)
Chloride: 102 (ref 99–108)
Creatinine: 1.5 — AB (ref 0.6–1.3)
Glucose: 161
Potassium: 4.7 meq/L (ref 3.5–5.1)
Sodium: 137 (ref 137–147)

## 2023-10-20 LAB — CBC AND DIFFERENTIAL
HCT: 35 — AB (ref 41–53)
Hemoglobin: 11.4 — AB (ref 13.5–17.5)
Platelets: 174 10*3/uL (ref 150–400)
WBC: 11

## 2023-10-20 LAB — COMPREHENSIVE METABOLIC PANEL: Calcium: 8.3 — AB (ref 8.7–10.7)

## 2023-10-22 ENCOUNTER — Encounter: Payer: Self-pay | Admitting: Orthopedic Surgery

## 2023-10-22 ENCOUNTER — Non-Acute Institutional Stay: Payer: Medicare Other | Admitting: Orthopedic Surgery

## 2023-10-22 DIAGNOSIS — M545 Low back pain, unspecified: Secondary | ICD-10-CM | POA: Diagnosis not present

## 2023-10-22 DIAGNOSIS — K5901 Slow transit constipation: Secondary | ICD-10-CM

## 2023-10-22 NOTE — Progress Notes (Signed)
Location:   [facility] Room Number: 37-A Place of Service:  ALF [phone]) Provider:  Hazle Nordmann, NP  PCP: Mahlon Gammon, MD  Patient Care Team: Mahlon Gammon, MD as PCP - General (Internal Medicine) Janalyn Harder, MD (Inactive) as Consulting Physician (Dermatology)  Extended Emergency Contact Information Primary Emergency Contact: Georga Hacking Address: [address]          [place], [place] [address] D[place] of [place] Home Phone: [phone] Work Phone: [phone] Mobile Phone: [phone] Relation: Son Secondary Emergency Contact: Mezera,Chris Mobile Phone: [phone] Relation: Son Preferred language: English  Code Status:  DNR Goals of care: Advanced Directive information    10/22/2023    4:52 PM  Advanced Directives  Does Patient Have a Medical Advance Directive? Yes  Type of Estate agent of [place];Living will  Does patient want to make changes to medical advance directive? No - Patient declined  Copy of Healthcare Power of Attorney in Chart? Yes - validated most recent copy scanned in chart (See row information)     Chief Complaint  Patient presents with   Acute Visit    Constipation.     HPI:  Pt is a 89 y.o. male seen today for acute visit due to ongoing back pain and constipation.   He currently resides on the assisted living unit at [facility]. PMH: GERD, constipation, HTN, afib, tachy-brady syndrome s/p pacemaker, left hip fracture s/p LATH 09/2021 , CKD, anemia, BPH, insomnia, depression.     12/06 he had ongoing constipation. He was started on miralax BID x 2 days. Fleet enema administered. Rectal exam negative for impaction. 12/06 KUB negative for obstruction or mass. He reports "blow out" bowel movement over the weekend. Denies abdominal pain, N/V today.   Chronic midline/right sided back pain ongoing. Tramadol effective in reliving pain for awhile. He has been able to transfer from bed  to recliner a 1-2 times daily. Scheduled for PT evaluation today. OT evaluation last week. Denies pain down legs or loss of B&B function. Dr. Chales Abrahams discussed possible ortho evaluation if no improvement this week.    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   History of kidney stones    Hypertension    Kidney stone    Presence of permanent cardiac pacemaker 06/03/2020   SCC (squamous cell carcinoma) 07/17/2008   SCC WELL DIFF (RIGHT HELICAL MARGIN)(MOHS)   SCC (squamous cell carcinoma) 03/24/2013   SCC IN SITU (BACK SCALP)(TX AFTER BX)   SCC (squamous cell carcinoma) 03/24/2013   SCC IN SITU (RIGHT SCALP)(TX AFTER BX)   SCC (squamous cell carcinoma) 03/24/2013   SCC IN SITU (MID SCALP)(TX AFTER BX)   SCC (squamous cell carcinoma) 03/24/2013   SCC IN SITU (MID NECK)(TX AFTER BX)   Spinal stenosis    Squamous cell carcinoma of skin 11/25/1997   ant right scalp    Tachy-brady syndrome (HCC) 06/03/2020   Past Surgical History:  Procedure Laterality Date   addnoid     CYSTOSCOPY W/ URETEROSCOPY  1980  INSERT / REPLACE / REMOVE PACEMAKER  06/03/2020   KNEE SURGERY     NAILBED REPAIR Left 06/27/2016   Procedure: NAILBED biopsy left thumb;  Surgeon: Cindee Salt, MD;  Location: [facility];  Service: Orthopedics;  Laterality: Left;  FAB   PACEMAKER IMPLANT N/A 06/03/2020   Procedure: PACEMAKER IMPLANT;  Surgeon: Regan Lemming, MD;  Location: [facility];  Service: Cardiovascular;  Laterality: N/A;   TONSILLECTOMY     TOTAL HIP ARTHROPLASTY Left 10/04/2021   Procedure: TOTAL HIP ARTHROPLASTY ANTERIOR  APPROACH;  Surgeon: Durene Romans, MD;  Location: [facility];  Service: Orthopedics;  Laterality: Left;    No Known Allergies  Allergies as of 10/22/2023   No Known Allergies      Medication List        Accurate as of October 22, 2023  4:52 PM. If you have any questions, ask your nurse or doctor.          acetaminophen 325 MG tablet Commonly known as: TYLENOL Take 650 mg by mouth daily.   acetaminophen 500 MG tablet Commonly known as: TYLENOL Take 2 tablets (1,000 mg total) by mouth 3 (three) times daily as needed.   amiodarone 200 MG tablet Commonly known as: PACERONE Take 200 mg by mouth daily.   amoxicillin 500 MG tablet Commonly known as: AMOXIL Take 2,000 mg by mouth once as needed. Take one hour prior to dental procedures   apixaban 2.5 MG Tabs tablet Commonly known as: Eliquis Take 1 tablet (2.5 mg total) by mouth 2 (two) times daily.   atorvastatin 10 MG tablet Commonly known as: LIPITOR Take 10 mg by mouth daily.   cetirizine 10 MG tablet Commonly known as: ZYRTEC Take 10 mg by mouth as needed for allergies. What changed: Another medication with the same name was removed. Continue taking this medication, and follow the directions you see here. Changed by: Octavia Heir   cyanocobalamin 1000 MCG tablet Take 1,000 mcg by mouth every morning.   diltiazem 120 MG 24 hr capsule Commonly known as: CARDIZEM CD Take 120 mg by mouth daily.   docusate sodium 100 MG capsule Commonly known as: COLACE Take 1 capsule (100 mg total) by mouth 2 (two) times daily.   furosemide 20 MG tablet Commonly known as: LASIX Take 20 mg by mouth daily.   lidocaine 4 % Place 1 patch onto the skin daily for 14 days. Apply to middle back   magnesium hydroxide 400 MG/5ML suspension Commonly known as: MILK OF MAGNESIA Take 30 mLs by mouth daily as needed for mild constipation.   melatonin 5 MG Tabs Take 5 mg by mouth at bedtime as needed.   methocarbamol 500 MG  tablet Commonly known as: ROBAXIN Take 250 mg by mouth every 6 (six) hours as needed for muscle spasms.   METHOCARBAMOL PO Take 250 mg by mouth daily.   mirtazapine 15 MG tablet Commonly known as: REMERON Take 15 mg by mouth at bedtime.   pantoprazole 40 MG tablet Commonly known as: PROTONIX Take 1 tablet (40 mg total) by mouth daily.   polyethylene glycol powder 17 GM/SCOOP powder Commonly known as: GLYCOLAX/MIRALAX Take 17 g by mouth daily.   predniSONE 5 MG tablet Commonly known as: DELTASONE Take 5 mg by mouth daily with breakfast.   PRESERVISION AREDS 2+MULTI VIT PO Take 1 capsule by mouth in the morning and at bedtime.   senna-docusate 8.6-50 MG tablet Commonly known as: Senokot-S Take 2 tablets by  mouth daily.   tamsulosin 0.4 MG Caps capsule Commonly known as: FLOMAX Take 0.4 mg by mouth daily.   timolol 0.5 % ophthalmic solution Commonly known as: TIMOPTIC Place 1 drop into both eyes daily.   traMADol 50 MG tablet Commonly known as: ULTRAM Take 50 mg by mouth every 6 (six) hours as needed for severe pain (pain score 7-10).   traMADol 50 MG tablet Commonly known as: ULTRAM Take 1 tablet (50 mg total) by mouth every 8 (eight) hours.   traZODone 50 MG tablet Commonly known as: DESYREL Take 50 mg by mouth daily.   zinc oxide 20 % ointment Apply 1 application topically as needed for irritation.        Review of Systems  Constitutional:  Positive for fatigue.  HENT:  Negative for trouble swallowing.   Eyes:  Negative for visual disturbance.  Respiratory:  Negative for shortness of breath and wheezing.   Cardiovascular:  Negative for chest pain and leg swelling.  Gastrointestinal:  Positive for constipation. Negative for abdominal distention, abdominal pain, diarrhea, nausea and vomiting.  Genitourinary:  Positive for frequency.  Musculoskeletal:  Positive for back pain and gait problem.  Skin:  Negative for wound.  Neurological:  Positive for  weakness. Negative for dizziness and headaches.  Psychiatric/Behavioral:  Negative for confusion and dysphoric mood. The patient is not nervous/anxious.     Immunization History  Administered Date(s) Administered   Fluad Quad(high Dose 65+) 09/06/2022, 09/27/2023   Influenza, High Dose Seasonal PF 08/07/2016   Influenza-Unspecified 08/04/2020, 09/06/2021   Moderna SARS-COV2 Booster Vaccination 09/09/2020   Moderna Sars-Covid-2 Vaccination 11/18/2019, 12/15/2019, 09/19/2022   PFIZER(Purple Top)SARS-COV-2 Vaccination 08/03/2021   PNEUMOCOCCAL CONJUGATE-20 02/10/2022   Pfizer Covid-19 Vaccine Bivalent Booster 43yrs & up 04/26/2022   Pneumococcal-Unspecified 09/13/2020   Tdap 02/10/2022   Pertinent  Health Maintenance Due  Topic Date Due   INFLUENZA VACCINE  Completed      12/23/2021   12:37 PM 10/27/2022   11:09 AM 01/19/2023    2:15 PM 05/04/2023    3:25 PM 10/15/2023    6:50 PM  Fall Risk  Falls in the past year? 1 0 1 0 0  Was there an injury with Fall? 1 0 0 0 0  Fall Risk Category Calculator 3 0 1 0 0  Fall Risk Category (Retired) High Low     (RETIRED) Patient Fall Risk Level High fall risk High fall risk     Patient at Risk for Falls Due to History of fall(s);Impaired balance/gait;Impaired mobility;Orthopedic patient History of fall(s) History of fall(s);Impaired mobility History of fall(s);Impaired balance/gait;Impaired mobility History of fall(s);Impaired balance/gait;Impaired mobility  Fall risk Follow up Falls evaluation completed;Education provided;Falls prevention discussed Falls evaluation completed Falls evaluation completed;Falls prevention discussed;Education provided Falls evaluation completed;Education provided;Falls prevention discussed Falls evaluation completed;Education provided;Falls prevention discussed   Functional Status Survey:    Vitals:   10/22/23 1637  BP: (!) 164/90  Pulse: 60  Resp: 13  Temp: (!) 97.1 F (36.2 C)  SpO2: 98%  Weight: 171 lb  (77.6 kg)  Height: 5\' 6"  (1.676 m)   Body mass index is 27.6 kg/m. Physical Exam Vitals reviewed.  Constitutional:      General: He is not in acute distress. HENT:     Head: Normocephalic.  Eyes:     General:        Right eye: No discharge.        Left eye: No discharge.  Cardiovascular:     Rate and Rhythm:  Normal rate. Rhythm irregular.     Pulses: Normal pulses.     Heart sounds: Normal heart sounds.  Pulmonary:     Effort: Pulmonary effort is normal. No respiratory distress.     Breath sounds: Normal breath sounds. No wheezing.  Abdominal:     General: Bowel sounds are normal. There is no distension.     Palpations: Abdomen is soft.     Tenderness: There is no abdominal tenderness.  Musculoskeletal:     Cervical back: Normal and neck supple.     Thoracic back: Normal.     Lumbar back: Tenderness present. Decreased range of motion.     Right lower leg: No edema.     Left lower leg: No edema.  Skin:    General: Skin is warm.     Capillary Refill: Capillary refill takes less than 2 seconds.  Neurological:     General: No focal deficit present.     Mental Status: He is alert and oriented to person, place, and time.     Motor: Weakness present.     Gait: Gait abnormal.  Psychiatric:        Mood and Affect: Mood normal.     Labs reviewed: Recent Labs    01/24/23 0000 05/07/23 0000  NA 143 143  K 4.3 4.0  CL 108 108  CO2 26* 25*  BUN 22* 20  CREATININE 1.3 1.4*  CALCIUM 8.5* 8.6*   Recent Labs    01/24/23 0000 05/07/23 0000  AST 24 22  ALT 27 24  ALKPHOS 74 72  ALBUMIN 3.4* 3.4*   Recent Labs    01/24/23 0000 05/07/23 0000  WBC 6.6 8.3  NEUTROABS 3,802.00 4,598.00  HGB 12.0* 11.6*  HCT 37* 36*  PLT 169 164   Lab Results  Component Value Date   TSH 2.47 07/30/2023   No results found for: "HGBA1C" Lab Results  Component Value Date   CHOL 186 01/24/2023   HDL 73 (A) 01/24/2023   LDLCALC 94 01/24/2023   TRIG 96 01/24/2023     Significant Diagnostic Results in last 30 days:  No results found.  Assessment/Plan 1. Acute right-sided low back pain without sciatica - ongoing, began 12/02 - no improvement after having BM - xray thoracic and lumbar negative for acute abnormalities - now on Tramadol 50 mg and robaxin - completed prednisone taper - some pain relief with Tramadol - PT evaluation today - cont tylenol and lidocaine patch - consider ortho referral if no improvement  2. Slow transit constipation - improved - 12/06 KUB with no obstruction or mass - 12/06 rectal exam no impaction - given fleet enema x1 and miralax BID x 2 days - cont daily miralax and senna  - encourage hydration  - encourage mobility when tolerated    Family/ staff Communication: plan discussed with patient and nurse  Labs/tests ordered: none

## 2023-10-25 ENCOUNTER — Encounter: Payer: Self-pay | Admitting: Internal Medicine

## 2023-10-25 ENCOUNTER — Non-Acute Institutional Stay: Payer: Medicare Other | Admitting: Internal Medicine

## 2023-10-25 DIAGNOSIS — K5901 Slow transit constipation: Secondary | ICD-10-CM

## 2023-10-25 DIAGNOSIS — M545 Low back pain, unspecified: Secondary | ICD-10-CM | POA: Diagnosis not present

## 2023-10-25 MED ORDER — OXYCODONE HCL 5 MG PO TABS: 5.0000 mg | ORAL_TABLET | Freq: Three times a day (TID) | ORAL | 0 refills | Status: DC

## 2023-10-25 NOTE — Progress Notes (Signed)
[facility] Service:  ALF ([id number])  Provider:   Code Status: DNR Goals of Care:     10/22/2023    4:52 PM  Advanced Directives  Does Patient Have a Medical Advance Directive? Yes  Type of Estate agent of [place];Living will;Out of facility DNR (pink MOST or yellow form)  Does patient want to make changes to medical advance directive? No - Patient declined  Copy of Healthcare Power of Attorney in Chart? Yes - validated most recent copy scanned in chart (See row information)     Chief Complaint  Patient presents with   Acute Visit    HPI: Patient is a 89 y.o. male seen today for an acute visit for Continuous back Pain Lives in AL in [facility]   Patient has a history of A. fib on Eliquis, hypertension, hyperlipidemia,   right clavicle and right pubic ramus fracture in 10/21 H/o Tachybrady s/p Pacemaker  Also h/o Chronic Pain in Lower Back  ON Chronic Prednisone per previous PCP H/o Left Hip Arthroplasty i   Patient had covid in 10/24 Since then he has been sitting in his Chair most of the time Started having Midline pain back pain since 12/02  Was started on Prednisone taper Also Tramadol Lidocaine patches  Helped some but now the pain is back and he says it is 10/10 Nothing seems to be helping Xrays done here have been negative Pain in in Upper Lumbar area. 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   History of kidney stones    Hypertension    Kidney stone     Presence of permanent cardiac pacemaker 06/03/2020   SCC (squamous cell carcinoma) 07/17/2008   SCC WELL DIFF (RIGHT HELICAL MARGIN)(MOHS)   SCC (squamous cell carcinoma) 03/24/2013   SCC IN SITU (BACK SCALP)(TX AFTER BX)   SCC (squamous cell carcinoma) 03/24/2013   SCC IN SITU (RIGHT SCALP)(TX AFTER BX)   SCC (squamous cell carcinoma) 03/24/2013   SCC IN SITU (MID SCALP)(TX AFTER BX)   SCC (squamous cell carcinoma) 03/24/2013   SCC IN SITU (MID NECK)(TX AFTER BX)   Spinal stenosis    Squamous cell carcinoma of skin 11/25/1997   ant right scalp    Tachy-brady syndrome (HCC) 06/03/2020    Past Surgical History:  Procedure Laterality Date   addnoid     CYSTOSCOPY W/ URETEROSCOPY  1980   INSERT / REPLACE / REMOVE PACEMAKER  06/03/2020   KNEE SURGERY     NAILBED REPAIR Left 06/27/2016   Procedure: NAILBED biopsy left thumb;  Surgeon: Cindee Salt, MD;  Location: [facility];  Service: Orthopedics;  Laterality: Left;  FAB   PACEMAKER IMPLANT N/A 06/03/2020   Procedure: PACEMAKER IMPLANT;  Surgeon: Regan Lemming, MD;  [facility]: [facility];  Service: Cardiovascular;  Laterality: N/A;   TONSILLECTOMY     TOTAL  HIP ARTHROPLASTY Left 10/04/2021   Procedure: TOTAL HIP ARTHROPLASTY ANTERIOR APPROACH;  Surgeon: Durene Romans, MD;  Location: [facility];  Service: Orthopedics;  Laterality: Left;    No Known Allergies  Outpatient Encounter Medications as of 10/25/2023  Medication Sig   acetaminophen (TYLENOL) 325 MG tablet Take 650 mg by mouth daily.   acetaminophen (TYLENOL) 500 MG tablet Take 2 tablets (1,000 mg total) by mouth 3 (three) times daily as needed.   amiodarone (PACERONE) 200 MG tablet Take 200 mg by mouth daily.   amoxicillin (AMOXIL) 500 MG tablet Take 2,000 mg by mouth once as needed. Take one hour prior to dental procedures   apixaban (ELIQUIS) 2.5 MG TABS tablet Take 1 tablet (2.5 mg total) by mouth 2 (two) times daily.   atorvastatin (LIPITOR) 10 MG  tablet Take 10 mg by mouth daily.   cetirizine (ZYRTEC) 10 MG tablet Take 10 mg by mouth as needed for allergies.   cyanocobalamin 1000 MCG tablet Take 1,000 mcg by mouth every morning.   diltiazem (CARDIZEM CD) 120 MG 24 hr capsule Take 120 mg by mouth daily.   docusate sodium (COLACE) 100 MG capsule Take 1 capsule (100 mg total) by mouth 2 (two) times daily.   furosemide (LASIX) 20 MG tablet Take 20 mg by mouth daily.   lidocaine 4 % Place 1 patch onto the skin daily for 14 days. Apply to middle back   magnesium hydroxide (MILK OF MAGNESIA) 400 MG/5ML suspension Take 30 mLs by mouth daily as needed for mild constipation.   melatonin 5 MG TABS Take 5 mg by mouth at bedtime as needed.   methocarbamol (ROBAXIN) 500 MG tablet Take 250 mg by mouth every 6 (six) hours as needed for muscle spasms.   METHOCARBAMOL PO Take 250 mg by mouth daily.   mirtazapine (REMERON) 15 MG tablet Take 15 mg by mouth at bedtime.   Multiple Vitamins-Minerals (PRESERVISION AREDS 2+MULTI VIT PO) Take 1 capsule by mouth in the morning and at bedtime.   pantoprazole (PROTONIX) 40 MG tablet Take 1 tablet (40 mg total) by mouth daily.   polyethylene glycol powder (GLYCOLAX/MIRALAX) 17 GM/SCOOP powder Take 17 g by mouth daily.   predniSONE (DELTASONE) 5 MG tablet Take 5 mg by mouth daily with breakfast.   senna-docusate (SENOKOT-S) 8.6-50 MG tablet Take 2 tablets by mouth daily.   tamsulosin (FLOMAX) 0.4 MG CAPS capsule Take 0.4 mg by mouth daily.   timolol (TIMOPTIC) 0.5 % ophthalmic solution Place 1 drop into both eyes daily.   traMADol (ULTRAM) 50 MG tablet Take 1 tablet (50 mg total) by mouth every 8 (eight) hours.   traMADol (ULTRAM) 50 MG tablet Take 50 mg by mouth every 6 (six) hours as needed for severe pain (pain score 7-10).   traZODone (DESYREL) 50 MG tablet Take 50 mg by mouth daily.   zinc oxide 20 % ointment Apply 1 application topically as needed for irritation.   No facility-administered encounter  medications on file as of 10/25/2023.    Review of Systems:  Review of Systems  Constitutional:  Positive for activity change and appetite change. Negative for unexpected weight change.  HENT: Negative.    Respiratory:  Negative for cough and shortness of breath.   Cardiovascular:  Negative for leg swelling.  Gastrointestinal:  Positive for constipation.  Genitourinary:  Negative for frequency.  Musculoskeletal:  Positive for arthralgias, back pain, gait problem and myalgias.  Skin: Negative.  Negative for rash.  Neurological:  Negative for dizziness  and weakness.  Psychiatric/Behavioral:  Negative for confusion and sleep disturbance.   All other systems reviewed and are negative.   Health Maintenance  Topic Date Due   Zoster Vaccines- Shingrix (1 of 2) Never done   COVID-19 Vaccine (6 - 2024-25 season) 07/15/2023   Medicare Annual Wellness (AWV)  01/19/2024   DTaP/Tdap/Td (2 - Td or Tdap) 02/11/2032   Pneumonia Vaccine 24+ Years old  Completed   INFLUENZA VACCINE  Completed   HPV VACCINES  Aged Out    Physical Exam: There were no vitals filed for this visit. There is no height or weight on file to calculate BMI. Physical Exam Vitals reviewed.  Constitutional:      Appearance: Normal appearance.  HENT:     Head: Normocephalic.     Nose: Nose normal.     Mouth/Throat:     Mouth: Mucous membranes are moist.     Pharynx: Oropharynx is clear.  Eyes:     Pupils: Pupils are equal, round, and reactive to light.  Cardiovascular:     Rate and Rhythm: Normal rate and regular rhythm.     Pulses: Normal pulses.     Heart sounds: No murmur heard. Pulmonary:     Effort: Pulmonary effort is normal. No respiratory distress.     Breath sounds: Normal breath sounds. No rales.  Abdominal:     General: Abdomen is flat. Bowel sounds are normal.     Palpations: Abdomen is soft.  Musculoskeletal:        General: No swelling.     Cervical back: Neck supple.  Skin:    General: Skin  is warm.  Neurological:     General: No focal deficit present.     Mental Status: He is alert and oriented to person, place, and time.  Psychiatric:        Mood and Affect: Mood normal.        Thought Content: Thought content normal.     Labs reviewed: Basic Metabolic Panel: Recent Labs    01/24/23 0000 05/07/23 0000 07/30/23 0000  NA 143 143  --   K 4.3 4.0  --   CL 108 108  --   CO2 26* 25*  --   BUN 22* 20  --   CREATININE 1.3 1.4*  --   CALCIUM 8.5* 8.6*  --   TSH 2.78 2.55 2.47   Liver Function Tests: Recent Labs    01/24/23 0000 05/07/23 0000  AST 24 22  ALT 27 24  ALKPHOS 74 72  ALBUMIN 3.4* 3.4*   No results for input(s): "LIPASE", "AMYLASE" in the last 8760 hours. No results for input(s): "AMMONIA" in the last 8760 hours. CBC: Recent Labs    01/24/23 0000 05/07/23 0000  WBC 6.6 8.3  NEUTROABS 3,802.00 4,598.00  HGB 12.0* 11.6*  HCT 37* 36*  PLT 169 164   Lipid Panel: Recent Labs    01/24/23 0000  CHOL 186  HDL 73*  LDLCALC 94  TRIG 96   No results found for: "HGBA1C"  Procedures since last visit: No results found.  Assessment/Plan 1. Acute midline low back pain without sciatica (Primary) Pain with Any kind of Motility Xray in the facility negative D/W the son He does not want him to go to Ortho right now due to his age and pain He wants his Pain controlled in the facility Will start him on medrol Pack Change Robaxin to 500 mg Q 6 PRN and Robaxin 500 mg at bedtime  Start on Oxycodone 5 mg TID for 2 weeks Continue Tramadol prn Schedule Tylenol 1000 mg TID for 2 weeks  2. Slow transit constipation Miralax and Senna KUB done few days ago was negative for any issues    Labs/tests ordered:  * No order type specified * Next appt:  Visit date not found

## 2023-10-31 ENCOUNTER — Non-Acute Institutional Stay: Payer: Medicare Other | Admitting: Orthopedic Surgery

## 2023-10-31 ENCOUNTER — Encounter: Payer: Self-pay | Admitting: Orthopedic Surgery

## 2023-10-31 DIAGNOSIS — K5901 Slow transit constipation: Secondary | ICD-10-CM

## 2023-10-31 DIAGNOSIS — M5441 Lumbago with sciatica, right side: Secondary | ICD-10-CM | POA: Diagnosis not present

## 2023-10-31 DIAGNOSIS — R630 Anorexia: Secondary | ICD-10-CM

## 2023-10-31 DIAGNOSIS — G8929 Other chronic pain: Secondary | ICD-10-CM

## 2023-10-31 DIAGNOSIS — M5442 Lumbago with sciatica, left side: Secondary | ICD-10-CM

## 2023-10-31 NOTE — Progress Notes (Signed)
Location:  [facility] Room Number: 37-A Place of Service:  SNF [id number]) Provider:  Tytionna Cloyd Roland Earl, MD  Patient Care Team: Mahlon Gammon, MD as PCP - General (Internal Medicine) Janalyn Harder, MD (Inactive) as Consulting Physician (Dermatology)  Extended Emergency Contact Information Primary Emergency Contact: Georga Hacking Address: [address]          [place], [place] [address] Darden [place] of [place] Home Phone: [phone] Work Phone: [phone] Mobile Phone: [phone] Relation: Son Secondary Emergency Contact: Mcclatchy,Chris Mobile Phone: [phone] Relation: Son Preferred language: English  Code Status:  DNR Goals of care: Advanced Directive information    10/31/2023   11:13 AM  Advanced Directives  Does Patient Have a Medical Advance Directive? Yes  Type of Estate agent of [place];Living will;Out of facility DNR (pink MOST or yellow form)  Does patient want to make changes to medical advance directive? No - Patient declined  Copy of Healthcare Power of Attorney in Chart? Yes - validated most recent copy scanned in chart (See row information)  Pre-existing out of facility DNR order (yellow form or pink MOST form) Yellow form placed in chart (order not valid for inpatient use)     Chief Complaint  Patient presents with   Acute Visit    Back pain    HPI:  Pt is a 89 y.o. male seen today for acute visit due to ongoing mid and lower back pain.   He currently resides on the assisted living unit at [facility]. PMH: GERD, constipation, HTN, afib, tachy-brady syndrome s/p pacemaker, left hip fracture s/p LATH 09/2021 , CKD, anemia, BPH, insomnia, depression.   Ongoing lower mid to lower back pain that began 11/29. He has been given prednisone taper and tramadol without relief. He had a period of constipation which was believed to be contributing to back pain. He is now having regular BM with  daily miralax. Xray to thoracic and lumbar negative for acute abnormalities. KUB negative for obstruction or mass. 12/12 Dr. Chales Abrahams started oxycodone for pain relief. He is now describing pain at rest and movement. Pain rated 7/10, mild radiation to buttocks, no loss of B&B. He is able to transfer to recliner and commode. This morning he refused OT. Son reports h/o kidney stones. UA pending. Patient was asking to go to hospital this morning. After further discussion, plan to treat outpatient. He is scheduled to see NP at [facility] 12/31. Will schedule MRI of thoracic/lumbar spine. Afebrile. Vitals stable.   Eating less per nursing.   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   History of kidney stones    Hypertension    Kidney stone    Presence of permanent cardiac pacemaker 06/03/2020   SCC (squamous cell carcinoma) 07/17/2008   SCC WELL DIFF (RIGHT HELICAL MARGIN)(MOHS)   SCC (squamous cell carcinoma) 03/24/2013   SCC IN SITU (BACK SCALP)(TX AFTER BX)   SCC (squamous cell carcinoma) 03/24/2013   SCC IN SITU (RIGHT SCALP)(TX AFTER BX)   SCC (squamous cell  carcinoma) 03/24/2013   SCC IN SITU (MID SCALP)(TX AFTER BX)   SCC (squamous cell carcinoma) 03/24/2013   SCC IN SITU (MID NECK)(TX AFTER BX)   Spinal stenosis    Squamous cell carcinoma of skin 11/25/1997   ant right scalp    Tachy-brady syndrome (HCC) 06/03/2020   Past Surgical History:  Procedure Laterality Date   addnoid     CYSTOSCOPY W/ URETEROSCOPY  1980   INSERT / REPLACE / REMOVE PACEMAKER  06/03/2020   KNEE SURGERY     NAILBED REPAIR Left 06/27/2016   Procedure:  NAILBED biopsy left thumb;  Surgeon: Cindee Salt, MD;  Location: [facility];  Service: Orthopedics;  Laterality: Left;  FAB   PACEMAKER IMPLANT N/A 06/03/2020   Procedure: PACEMAKER IMPLANT;  Surgeon: Regan Lemming, MD;  Location: [facility];  Service: Cardiovascular;  Laterality: N/A;   TONSILLECTOMY     TOTAL HIP ARTHROPLASTY Left 10/04/2021   Procedure: TOTAL HIP ARTHROPLASTY ANTERIOR APPROACH;  Surgeon: Durene Romans, MD;  Location: [facility];  Service: Orthopedics;  Laterality: Left;    No Known Allergies  Outpatient Encounter Medications as of 10/31/2023  Medication Sig   acetaminophen (TYLENOL) 500 MG tablet Take 2 tablets (1,000 mg total) by mouth 3 (three) times daily as needed.   amiodarone (PACERONE) 200 MG tablet Take 200 mg by mouth daily.   amoxicillin (AMOXIL) 500 MG tablet Take 2,000 mg by mouth once as needed. Take one hour prior to dental procedures   apixaban (ELIQUIS) 2.5 MG TABS tablet Take 1 tablet (2.5 mg total) by mouth 2 (two) times daily.   atorvastatin (LIPITOR) 10 MG tablet Take 10 mg by mouth daily.   cetirizine (ZYRTEC) 10 MG tablet Take 10 mg by mouth as needed for allergies.   cyanocobalamin 1000 MCG tablet Take 1,000 mcg by mouth every morning.   diltiazem (CARDIZEM CD) 120 MG 24 hr capsule Take 120 mg by mouth daily.   docusate sodium (COLACE) 100 MG capsule Take 1 capsule (100 mg total) by mouth 2 (two) times daily.   furosemide (LASIX) 20 MG tablet Take 20 mg by mouth daily.   magnesium hydroxide (MILK OF MAGNESIA) 400 MG/5ML suspension Take 30 mLs by mouth daily as needed for mild constipation.   melatonin 5 MG TABS Take 5 mg by mouth at bedtime as needed.   methocarbamol (ROBAXIN) 500 MG tablet Take 500 mg by mouth every 6 (six) hours as needed for muscle spasms.   METHOCARBAMOL PO Take 500 mg by mouth at bedtime.   methylPREDNISolone (MEDROL) 4 MG tablet Take 4 mg by mouth daily.   mirtazapine (REMERON) 15 MG tablet Take 15 mg  by mouth at bedtime.   Multiple Vitamins-Minerals (PRESERVISION AREDS 2+MULTI VIT PO) Take 1 capsule by mouth in the morning and at bedtime.   oxyCODONE (ROXICODONE) 5 MG immediate release tablet Take 1 tablet (5 mg total) by mouth in the morning, at noon, and at bedtime.   pantoprazole (PROTONIX) 40 MG tablet Take 1 tablet (40 mg total) by mouth daily.   polyethylene glycol powder (GLYCOLAX/MIRALAX) 17 GM/SCOOP powder Take 17 g by mouth daily.   predniSONE (DELTASONE) 5 MG tablet Take 5 mg by mouth daily with breakfast.   senna-docusate (SENOKOT-S) 8.6-50 MG tablet Take 2 tablets by mouth daily.   tamsulosin (FLOMAX) 0.4 MG CAPS capsule Take 0.4 mg by mouth daily.   timolol (TIMOPTIC) 0.5 % ophthalmic solution Place 1 drop into both eyes daily.  traZODone (DESYREL) 50 MG tablet Take 50 mg by mouth daily. Give 50 mg by mouth as needed for pain PRN TID   zinc oxide 20 % ointment Apply 1 application topically as needed for irritation.   acetaminophen (TYLENOL) 325 MG tablet Take 650 mg by mouth daily. (Patient not taking: Reported on 10/31/2023)   [DISCONTINUED] traMADol (ULTRAM) 50 MG tablet Take 50 mg by mouth every 6 (six) hours as needed for severe pain (pain score 7-10).   No facility-administered encounter medications on file as of 10/31/2023.    Review of Systems  Constitutional:  Positive for activity change and appetite change.  Respiratory:  Negative for cough, shortness of breath and wheezing.   Cardiovascular:  Negative for chest pain and leg swelling.  Gastrointestinal:  Positive for constipation. Negative for abdominal distention and abdominal pain.  Genitourinary:  Positive for flank pain. Negative for dysuria.  Musculoskeletal:  Positive for back pain and gait problem.  Neurological:  Positive for weakness. Negative for numbness.  Psychiatric/Behavioral:  Negative for dysphoric mood. The patient is not nervous/anxious.     Immunization History  Administered Date(s)  Administered   Fluad Quad(high Dose 65+) 09/06/2022, 09/27/2023   Influenza, High Dose Seasonal PF 08/07/2016   Influenza-Unspecified 08/04/2020, 09/06/2021   Moderna SARS-COV2 Booster Vaccination 09/09/2020   Moderna Sars-Covid-2 Vaccination 11/18/2019, 12/15/2019, 09/19/2022   PFIZER(Purple Top)SARS-COV-2 Vaccination 08/03/2021   PNEUMOCOCCAL CONJUGATE-20 02/10/2022   Pfizer Covid-19 Vaccine Bivalent Booster 59yrs & up 04/26/2022   Pneumococcal-Unspecified 09/13/2020   Tdap 02/10/2022   Pertinent  Health Maintenance Due  Topic Date Due   INFLUENZA VACCINE  Completed      12/23/2021   12:37 PM 10/27/2022   11:09 AM 01/19/2023    2:15 PM 05/04/2023    3:25 PM 10/15/2023    6:50 PM  Fall Risk  Falls in the past year? 1 0 1 0 0  Was there an injury with Fall? 1 0 0 0 0  Fall Risk Category Calculator 3 0 1 0 0  Fall Risk Category (Retired) High Low     (RETIRED) Patient Fall Risk Level High fall risk High fall risk     Patient at Risk for Falls Due to History of fall(s);Impaired balance/gait;Impaired mobility;Orthopedic patient History of fall(s) History of fall(s);Impaired mobility History of fall(s);Impaired balance/gait;Impaired mobility History of fall(s);Impaired balance/gait;Impaired mobility  Fall risk Follow up Falls evaluation completed;Education provided;Falls prevention discussed Falls evaluation completed Falls evaluation completed;Falls prevention discussed;Education provided Falls evaluation completed;Education provided;Falls prevention discussed Falls evaluation completed;Education provided;Falls prevention discussed   Functional Status Survey:    Vitals:   10/31/23 1102  BP: 111/71  Pulse: 70  Resp: (!) 22  Temp: 97.9 F (36.6 C)  SpO2: 97%  Weight: 171 lb (77.6 kg)  Height: 5\' 6"  (1.676 m)   Body mass index is 27.6 kg/m. Physical Exam Vitals reviewed.  Constitutional:      General: He is not in acute distress. HENT:     Head: Normocephalic.  Eyes:      General:        Right eye: No discharge.        Left eye: No discharge.  Cardiovascular:     Rate and Rhythm: Normal rate and regular rhythm.     Pulses: Normal pulses.     Heart sounds: Normal heart sounds.  Pulmonary:     Effort: Pulmonary effort is normal. No respiratory distress.     Breath sounds: Normal breath sounds. No wheezing.  Abdominal:  General: Bowel sounds are normal.     Palpations: Abdomen is soft.     Tenderness: There is no right CVA tenderness or left CVA tenderness.  Musculoskeletal:     Cervical back: Normal and neck supple.     Thoracic back: Tenderness present. No swelling or deformity. Decreased range of motion.     Lumbar back: Tenderness present. No swelling or deformity. Decreased range of motion.     Right lower leg: No edema.     Left lower leg: No edema.  Skin:    General: Skin is warm.     Capillary Refill: Capillary refill takes less than 2 seconds.  Neurological:     General: No focal deficit present.     Mental Status: He is alert and oriented to person, place, and time.     Motor: Weakness present.     Gait: Gait abnormal.  Psychiatric:        Mood and Affect: Mood normal.     Labs reviewed: Recent Labs    01/24/23 0000 05/07/23 0000  NA 143 143  K 4.3 4.0  CL 108 108  CO2 26* 25*  BUN 22* 20  CREATININE 1.3 1.4*  CALCIUM 8.5* 8.6*   Recent Labs    01/24/23 0000 05/07/23 0000  AST 24 22  ALT 27 24  ALKPHOS 74 72  ALBUMIN 3.4* 3.4*   Recent Labs    01/24/23 0000 05/07/23 0000  WBC 6.6 8.3  NEUTROABS 3,802.00 4,598.00  HGB 12.0* 11.6*  HCT 37* 36*  PLT 169 164   Lab Results  Component Value Date   TSH 2.47 07/30/2023   No results found for: "HGBA1C" Lab Results  Component Value Date   CHOL 186 01/24/2023   HDL 73 (A) 01/24/2023   LDLCALC 94 01/24/2023   TRIG 96 01/24/2023    Significant Diagnostic Results in last 30 days:  No results found.  Assessment/Plan 1. Chronic midline low back pain with  bilateral sciatica (Primary) - ongoing - h/o kidney stones> UA pending> recommend renal U/S if hematuria - failed prednisone taper and tramadol - now on oxycodone - refusing OT due to pain - concerned for progressing weakness - plan to treat outpatient - scheduled with [facility] 12/31 - will schedule MRI thoracis and Lumbar spine - cont PT/OT  2. Poor appetite - eating less meals per nursing - suspect SE of oxycodone  3. Slow transit constipation - abdomen soft  - recent KUB negative for obstruction and mass - cont miralax and senna daily     Family/ staff Communication: plan discussed with patient, son and nurse  Labs/tests ordered:  MRI thoracic and lumbar spine

## 2023-11-02 ENCOUNTER — Other Ambulatory Visit: Payer: Self-pay | Admitting: Orthopedic Surgery

## 2023-11-02 DIAGNOSIS — G8929 Other chronic pain: Secondary | ICD-10-CM

## 2023-11-02 MED ORDER — OXYCODONE HCL 5 MG PO TABS: 5.0000 mg | ORAL_TABLET | Freq: Three times a day (TID) | ORAL | 0 refills | Status: DC

## 2023-11-09 ENCOUNTER — Non-Acute Institutional Stay: Payer: Medicare Other | Admitting: Orthopedic Surgery

## 2023-11-09 ENCOUNTER — Encounter: Payer: Self-pay | Admitting: Orthopedic Surgery

## 2023-11-09 ENCOUNTER — Ambulatory Visit (HOSPITAL_BASED_OUTPATIENT_CLINIC_OR_DEPARTMENT_OTHER): Admission: RE | Admit: 2023-11-09 | Payer: Medicare Other | Source: Ambulatory Visit

## 2023-11-09 DIAGNOSIS — M5441 Lumbago with sciatica, right side: Secondary | ICD-10-CM

## 2023-11-09 DIAGNOSIS — M5442 Lumbago with sciatica, left side: Secondary | ICD-10-CM

## 2023-11-09 DIAGNOSIS — G8929 Other chronic pain: Secondary | ICD-10-CM | POA: Diagnosis not present

## 2023-11-09 DIAGNOSIS — R6889 Other general symptoms and signs: Secondary | ICD-10-CM

## 2023-11-09 NOTE — Progress Notes (Signed)
Location:   [facility]  Nursing Home Room Number: 37-A Place of Service:  ALF [phone]) Provider:  Hazle Nordmann, NP  PCP: Mahlon Gammon, MD  Patient Care Team: Mahlon Gammon, MD as PCP - General (Internal Medicine) Janalyn Harder, MD (Inactive) as Consulting Physician (Dermatology)  Extended Emergency Contact Information Primary Emergency Contact: Georga Hacking Address: [address]          [place], [place] [address] [place] Home Phone: [phone] Work Phone: [phone] Mobile Phone: [phone] Relation: Son Secondary Emergency Contact: Atallah,Chris Mobile Phone: [phone] Relation: Son Preferred language: English  Code Status:  DNR Goals of care: Advanced Directive information    11/09/2023   11:01 AM  Advanced Directives  Does Patient Have a Medical Advance Directive? Yes  Type of Estate agent of [place];Living will;Out of facility DNR (pink MOST or yellow form)  Does patient want to make changes to medical advance directive? No - Patient declined  Copy of Healthcare Power of Attorney in Chart? Yes - validated most recent copy scanned in chart (See row information)     Chief Complaint  Patient presents with   Acute Visit    Pressure injury to right hip.    HPI:  Pt is a 89 y.o. male seen today for acute visit due to suspected deep tissue injury.  He currently resides on the assisted living unit at [facility]. PMH: GERD, constipation, HTN, afib, tachy-brady syndrome s/p pacemaker, left hip fracture s/p LATH 09/2021 , CKD, anemia, BPH, insomnia, depression.   11/29 middle to lower back pain began. Failed trial of prednisone and tramadol. 12/23 he went to [facility] urgent care due to unresolved back pain. Suspected compression fracture of T11 25-50% probability per note. Advised to continue current pain regimen and f/u in 1 week. He is followed by PT/OT.   Nursing noted area of increased redness to  right hip. He has been sitting more since back injury. Today, nursing reports quarter sized area of blue skin with red ring. He has been observed sitting in recliner most of day. He keeps chair remote and television remote on his side at times. He denies pain to right hip. Afebrile. Vitals stable.      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   History of kidney stones    Hypertension    Kidney stone    Presence of permanent cardiac pacemaker 06/03/2020   SCC (squamous cell carcinoma) 07/17/2008   SCC WELL DIFF (RIGHT HELICAL MARGIN)(MOHS)   SCC (squamous cell carcinoma) 03/24/2013   SCC IN SITU (BACK SCALP)(TX AFTER BX)   SCC (squamous cell carcinoma) 03/24/2013   SCC IN SITU (RIGHT SCALP)(TX AFTER BX)   SCC (squamous cell carcinoma) 03/24/2013   SCC IN SITU (MID SCALP)(TX AFTER BX)   SCC (squamous cell carcinoma) 03/24/2013   SCC IN SITU (MID NECK)(TX AFTER BX)   Spinal stenosis    Squamous cell carcinoma of skin 11/25/1997   ant right scalp    Tachy-brady syndrome (HCC) 06/03/2020  Past Surgical History:  Procedure Laterality Date   addnoid     CYSTOSCOPY W/ URETEROSCOPY  1980   INSERT / REPLACE / REMOVE PACEMAKER  06/03/2020   KNEE SURGERY     NAILBED REPAIR Left 06/27/2016   Procedure: NAILBED biopsy left thumb;  Surgeon: Cindee Salt, MD;  Location: [facility];  Service: Orthopedics;  Laterality: Left;  FAB   PACEMAKER IMPLANT N/A 06/03/2020   Procedure: PACEMAKER IMPLANT;  Surgeon: Regan Lemming, MD;  Location: [facility];  Service: Cardiovascular;  Laterality: N/A;   TONSILLECTOMY      TOTAL HIP ARTHROPLASTY Left 10/04/2021   Procedure: TOTAL HIP ARTHROPLASTY ANTERIOR APPROACH;  Surgeon: Durene Romans, MD;  Location: [facility];  Service: Orthopedics;  Laterality: Left;    No Known Allergies  Allergies as of 11/09/2023   No Known Allergies      Medication List        Accurate as of November 09, 2023 11:03 AM. If you have any questions, ask your nurse or doctor.          STOP taking these medications    methylPREDNISolone 4 MG tablet Commonly known as: MEDROL Stopped by: Yarissa Reining E Amisadai Woodford       TAKE these medications    acetaminophen 500 MG tablet Commonly known as: TYLENOL Take 2 tablets (1,000 mg total) by mouth 3 (three) times daily as needed. What changed: Another medication with the same name was removed. Continue taking this medication, and follow the directions you see here. Changed by: Octavia Heir   amiodarone 200 MG tablet Commonly known as: PACERONE Take 200 mg by mouth daily.   amoxicillin 500 MG tablet Commonly known as: AMOXIL Take 2,000 mg by mouth once as needed. Take one hour prior to dental procedures   apixaban 2.5 MG Tabs tablet Commonly known as: Eliquis Take 1 tablet (2.5 mg total) by mouth 2 (two) times daily.   atorvastatin 10 MG tablet Commonly known as: LIPITOR Take 10 mg by mouth daily.   calcitonin (salmon) 200 UNIT/ACT nasal spray Commonly known as: MIACALCIN/FORTICAL Place 1 spray into alternate nostrils daily.   cetirizine 10 MG tablet Commonly known as: ZYRTEC Take 10 mg by mouth as needed for allergies.   cyanocobalamin 1000 MCG tablet Take 1,000 mcg by mouth every morning.   diltiazem 120 MG 24 hr capsule Commonly known as: CARDIZEM CD Take 120 mg by mouth daily.   docusate sodium 100 MG capsule Commonly known as: COLACE Take 1 capsule (100 mg total) by mouth 2 (two) times daily.   furosemide 20 MG tablet Commonly known as: LASIX Take 20 mg by mouth daily.   magnesium hydroxide 400 MG/5ML  suspension Commonly known as: MILK OF MAGNESIA Take 30 mLs by mouth daily as needed for mild constipation.   melatonin 5 MG Tabs Take 5 mg by mouth as needed.   methocarbamol 500 MG tablet Commonly known as: ROBAXIN Take 500 mg by mouth every 6 (six) hours as needed for muscle spasms.   METHOCARBAMOL PO Take 500 mg by mouth at bedtime.   mirtazapine 15 MG tablet Commonly known as: REMERON Take 15 mg by mouth at bedtime.   oxyCODONE 5 MG immediate release tablet Commonly known as: Roxicodone Take 1 tablet (5 mg total) by mouth in the morning, at noon, and at bedtime.   pantoprazole 40 MG tablet Commonly known as: PROTONIX Take 1 tablet (40 mg total) by mouth daily.   polyethylene glycol 17  g packet Commonly known as: MIRALAX / GLYCOLAX Take 17 g by mouth as needed. What changed: Another medication with the same name was removed. Continue taking this medication, and follow the directions you see here. Changed by: Octavia Heir   polyethylene glycol 17 g packet Commonly known as: MIRALAX / GLYCOLAX Take 17 g by mouth daily. What changed: Another medication with the same name was removed. Continue taking this medication, and follow the directions you see here. Changed by: Octavia Heir   predniSONE 5 MG tablet Commonly known as: DELTASONE Take 5 mg by mouth daily with breakfast.   PRESERVISION AREDS 2+MULTI VIT PO Take 1 capsule by mouth in the morning and at bedtime.   promethazine 12.5 MG suppository Commonly known as: PHENERGAN Place 12.5 mg rectally every 6 (six) hours as needed for nausea or vomiting.   senna-docusate 8.6-50 MG tablet Commonly known as: Senokot-S Take 2 tablets by mouth at bedtime.   tamsulosin 0.4 MG Caps capsule Commonly known as: FLOMAX Take 0.4 mg by mouth daily.   timolol 0.5 % ophthalmic solution Commonly known as: TIMOPTIC Place 1 drop into both eyes daily.   traMADol 50 MG tablet Commonly known as: ULTRAM Take 50 mg by mouth as  needed.   traZODone 50 MG tablet Commonly known as: DESYREL Take 50 mg by mouth daily. Give 50 mg by mouth as needed for pain PRN TID   zinc oxide 20 % ointment Apply 1 application topically as needed for irritation.        Review of Systems  Constitutional:  Positive for activity change. Negative for appetite change.  Respiratory:  Negative for shortness of breath.   Cardiovascular:  Negative for chest pain.  Gastrointestinal:  Negative for constipation.  Musculoskeletal:  Positive for back pain and gait problem.  Skin:  Positive for color change and wound.  Neurological:  Positive for weakness.  Psychiatric/Behavioral:  Negative for dysphoric mood. The patient is not nervous/anxious.     Immunization History  Administered Date(s) Administered   Fluad Quad(high Dose 65+) 09/06/2022, 09/27/2023   Influenza, High Dose Seasonal PF 08/07/2016   Influenza-Unspecified 08/04/2020, 09/06/2021   Moderna SARS-COV2 Booster Vaccination 09/09/2020   Moderna Sars-Covid-2 Vaccination 11/18/2019, 12/15/2019, 09/19/2022   PFIZER(Purple Top)SARS-COV-2 Vaccination 08/03/2021   PNEUMOCOCCAL CONJUGATE-20 02/10/2022   Pfizer Covid-19 Vaccine Bivalent Booster 78yrs & up 04/26/2022   Pneumococcal-Unspecified 09/13/2020   Tdap 02/10/2022   Pertinent  Health Maintenance Due  Topic Date Due   INFLUENZA VACCINE  Completed      12/23/2021   12:37 PM 10/27/2022   11:09 AM 01/19/2023    2:15 PM 05/04/2023    3:25 PM 10/15/2023    6:50 PM  Fall Risk  Falls in the past year? 1 0 1 0 0  Was there an injury with Fall? 1 0 0 0 0  Fall Risk Category Calculator 3 0 1 0 0  Fall Risk Category (Retired) High Low     (RETIRED) Patient Fall Risk Level High fall risk High fall risk     Patient at Risk for Falls Due to History of fall(s);Impaired balance/gait;Impaired mobility;Orthopedic patient History of fall(s) History of fall(s);Impaired mobility History of fall(s);Impaired balance/gait;Impaired mobility  History of fall(s);Impaired balance/gait;Impaired mobility  Fall risk Follow up Falls evaluation completed;Education provided;Falls prevention discussed Falls evaluation completed Falls evaluation completed;Falls prevention discussed;Education provided Falls evaluation completed;Education provided;Falls prevention discussed Falls evaluation completed;Education provided;Falls prevention discussed   Functional Status Survey:    Vitals:  11/09/23 1050  BP: 100/72  Pulse: 85  Resp: 20  Temp: (!) 97.3 F (36.3 C)  SpO2: 96%  Weight: 171 lb (77.6 kg)  Height: 5\' 6"  (1.676 m)   Body mass index is 27.6 kg/m. Physical Exam Vitals reviewed.  Constitutional:      General: He is not in acute distress. HENT:     Head: Normocephalic.  Eyes:     General:        Right eye: No discharge.        Left eye: No discharge.  Cardiovascular:     Rate and Rhythm: Normal rate. Rhythm irregular.     Pulses: Normal pulses.     Heart sounds: Normal heart sounds.  Pulmonary:     Effort: Pulmonary effort is normal.     Breath sounds: Normal breath sounds.  Abdominal:     General: Bowel sounds are normal. There is no distension.     Palpations: Abdomen is soft.     Tenderness: There is no abdominal tenderness.  Musculoskeletal:     Cervical back: Normal and neck supple.     Thoracic back: Tenderness present. No swelling or deformity. Decreased range of motion.     Lumbar back: Tenderness present. No swelling or deformity. Normal range of motion.     Right lower leg: No edema.     Left lower leg: No edema.  Skin:    General: Skin is warm.     Capillary Refill: Capillary refill takes less than 2 seconds.     Comments: Quarter sized nonblanchable area with bluish center, no skin breakdown/tenderness/drianage, lesion same size as chair remote  Neurological:     General: No focal deficit present.     Mental Status: He is alert and oriented to person, place, and time.     Motor: Weakness present.      Gait: Gait abnormal.  Psychiatric:        Mood and Affect: Mood normal.     Labs reviewed: Recent Labs    05/07/23 0000 06/15/23 0000 10/20/23 0000  NA 143 140 137  K 4.0 4.0 4.7  CL 108 104 102  CO2 25* 25* 27*  BUN 20 18 28*  CREATININE 1.4* 1.4* 1.5*  CALCIUM 8.6* 9.1 8.3*   Recent Labs    01/24/23 0000 05/07/23 0000  AST 24 22  ALT 27 24  ALKPHOS 74 72  ALBUMIN 3.4* 3.4*   Recent Labs    01/24/23 0000 05/07/23 0000 06/15/23 0000 10/20/23 0000  WBC 6.6 8.3 6.4 11.0  NEUTROABS 3,802.00 4,598.00 3,494.00  --   HGB 12.0* 11.6* 11.9* 11.4*  HCT 37* 36* 37* 35*  PLT 169 164 167 174   Lab Results  Component Value Date   TSH 2.47 07/30/2023   No results found for: "HGBA1C" Lab Results  Component Value Date   CHOL 186 01/24/2023   HDL 73 (A) 01/24/2023   LDLCALC 94 01/24/2023   TRIG 96 01/24/2023    Significant Diagnostic Results in last 30 days:  No results found.  Assessment/Plan 1. Suspected deep tissue injury (Primary) - erythema to right hip began last week - right hip with painless bluish lesion and erythema> size of chair remote today - no recent fall or injury - no skin breakdown - concerns for DTI> sitting more since increased back pain - start foam dressings today - leave remotes on side table - report skin breakdown to PCP> consider starting Santyl  2. Chronic midline low  back pain with bilateral sciatica -  failed prednisone taper and tramadol - now on oxycodone - concerned for progressing weakness - 12/23 [facility] urgent care visit> suspected T11 compression fracture - scheduled with [facility] 12/31 - cont PT/OT    Family/ staff Communication: plan discussed with patient and nurse  Labs/tests ordered:  none

## 2023-11-13 ENCOUNTER — Non-Acute Institutional Stay: Payer: Medicare Other | Admitting: Adult Health

## 2023-11-13 ENCOUNTER — Other Ambulatory Visit: Payer: Self-pay | Admitting: Adult Health

## 2023-11-13 ENCOUNTER — Encounter: Payer: Self-pay | Admitting: Adult Health

## 2023-11-13 DIAGNOSIS — M5441 Lumbago with sciatica, right side: Secondary | ICD-10-CM

## 2023-11-13 DIAGNOSIS — M5442 Lumbago with sciatica, left side: Secondary | ICD-10-CM | POA: Diagnosis not present

## 2023-11-13 DIAGNOSIS — F5105 Insomnia due to other mental disorder: Secondary | ICD-10-CM

## 2023-11-13 DIAGNOSIS — G8929 Other chronic pain: Secondary | ICD-10-CM

## 2023-11-13 DIAGNOSIS — F418 Other specified anxiety disorders: Secondary | ICD-10-CM

## 2023-11-13 MED ORDER — HYDROMORPHONE HCL 2 MG PO TABS: 2.0000 mg | ORAL_TABLET | ORAL | 0 refills | Status: AC | PRN

## 2023-11-13 MED ORDER — GABAPENTIN 300 MG PO CAPS: 300.0000 mg | ORAL_CAPSULE | Freq: Every day | ORAL | 3 refills | Status: AC

## 2023-11-13 MED ORDER — MIRTAZAPINE 30 MG PO TABS: 30.0000 mg | ORAL_TABLET | Freq: Every day | ORAL | 3 refills | Status: AC

## 2023-11-13 MED ORDER — FENTANYL 12 MCG/HR TD PT72: 1.0000 | MEDICATED_PATCH | TRANSDERMAL | 0 refills | Status: AC

## 2023-11-13 NOTE — Progress Notes (Addendum)
 Location:  [facility] Room Number: 37A Place of Service:  ALF [phone]) Provider:  Medina-Vargas, Tahj Njoku, DNP, FNP-BC  Patient Care Team: Charlanne Fredia CROME, MD as PCP - General (Internal Medicine) Livingston Rigg, MD (Inactive) as Consulting Physician (Dermatology)  Extended Emergency Contact Information Primary Emergency Contact: Eliza Lamar CROME Address: [address]          [place], [place] [address] [place] Home Phone: [phone] Work Phone: [phone] Mobile Phone: [phone] Relation: Son Secondary Emergency Contact: Barrero,Chris Mobile Phone: [phone] Relation: Son Preferred language: English  Code Status:  DNR  Goals of care: Advanced Directive information    11/09/2023   11:01 AM  Advanced Directives  Does Patient Have a Medical Advance Directive? Yes  Type of Estate Agent of [place];Living will;Out of facility DNR (pink MOST or yellow form)  Does patient want to make changes to medical advance directive? No - Patient declined  Copy of Healthcare Power of Attorney in Chart? Yes - validated most recent copy scanned in chart (See row information)     Chief Complaint  Patient presents with   Acute Visit    Pain management    HPI:  Pt is a 89 y.o. male seen today for an acute visit for pain management. He is a resident of [facility]. Dr. Caron Cha of [facility] came to see patient. Family visiting patient today. Dr. Cha recommended pain medication changes and patient and family is in agreement. Patient has chronic midline low back pain, rated as 10/10. He has insomnia and Mirtazapine  was increased to 30 mg Q HS.   Past Medical History:  Diagnosis Date   Ankle edema    Anxiety    Arthritis    Basal cell carcinoma 11/21/2004   MID FOREHEAD   BCC (basal cell carcinoma of skin) 08/26/2007   (LEFT OUTER, ZYGOMATIC ARCH)(CURET3, EXC)   Dyspnea    Dysrhythmia     a-fib/flutter- on eliquis    GERD (gastroesophageal reflux disease)    no treatment   Hard of hearing    Heart block AV complete (HCC) 07/31/2020   History of kidney stones    Hypertension    Kidney stone    Presence of permanent cardiac pacemaker 06/03/2020   SCC (squamous cell carcinoma) 07/17/2008   SCC WELL DIFF (RIGHT HELICAL MARGIN)(MOHS)   SCC (squamous cell carcinoma) 03/24/2013   SCC IN SITU (BACK SCALP)(TX AFTER BX)   SCC (squamous cell carcinoma) 03/24/2013   SCC IN SITU (RIGHT SCALP)(TX AFTER BX)   SCC (squamous cell carcinoma) 03/24/2013   SCC IN SITU (MID SCALP)(TX AFTER BX)   SCC (squamous cell carcinoma) 03/24/2013   SCC IN SITU (MID NECK)(TX AFTER BX)   Spinal stenosis    Squamous cell carcinoma of skin 11/25/1997   ant right scalp    Tachy-brady syndrome (HCC) 06/03/2020   Past Surgical History:  Procedure Laterality Date   addnoid     CYSTOSCOPY W/ URETEROSCOPY  1980   INSERT / REPLACE / REMOVE PACEMAKER  06/03/2020   KNEE SURGERY     NAILBED REPAIR Left 06/27/2016   Procedure: NAILBED biopsy left thumb;  Surgeon: Arley Curia, MD;  Location: [facility];  Service: Orthopedics;  Laterality: Left;  FAB   PACEMAKER IMPLANT N/A 06/03/2020   Procedure: PACEMAKER IMPLANT;  Surgeon: Inocencio Soyla Lunger, MD;  [facility];  Service: Cardiovascular;  Laterality: N/A;   TONSILLECTOMY  TOTAL HIP ARTHROPLASTY Left 10/04/2021   Procedure: TOTAL HIP ARTHROPLASTY ANTERIOR APPROACH;  Surgeon: Ernie Cough, MD;  Location: [facility];  Service: Orthopedics;  Laterality: Left;    No Known Allergies  Outpatient Encounter Medications as of 11/13/2023  Medication Sig   acetaminophen  (TYLENOL ) 500 MG tablet Take 2 tablets (1,000 mg total) by mouth 3 (three) times daily as needed.   amiodarone  (PACERONE ) 200 MG tablet Take 200 mg by mouth daily.   amoxicillin (AMOXIL) 500 MG tablet Take 2,000 mg by mouth once as needed. Take one hour prior to dental  procedures   apixaban  (ELIQUIS ) 2.5 MG TABS tablet Take 1 tablet (2.5 mg total) by mouth 2 (two) times daily.   atorvastatin  (LIPITOR) 10 MG tablet Take 10 mg by mouth daily.   calcitonin, salmon, (MIACALCIN/FORTICAL) 200 UNIT/ACT nasal spray Place 1 spray into alternate nostrils daily.   cetirizine  (ZYRTEC ) 10 MG tablet Take 10 mg by mouth as needed for allergies.   cyanocobalamin 1000 MCG tablet Take 1,000 mcg by mouth every morning.   diltiazem  (CARDIZEM  CD) 120 MG 24 hr capsule Take 120 mg by mouth daily.   docusate sodium  (COLACE) 100 MG capsule Take 1 capsule (100 mg total) by mouth 2 (two) times daily.   fentaNYL  (DURAGESIC ) 12 MCG/HR Place 1 patch onto the skin every 3 (three) days.   furosemide  (LASIX ) 20 MG tablet Take 20 mg by mouth daily.   gabapentin  (NEURONTIN ) 300 MG capsule Take 1 capsule (300 mg total) by mouth at bedtime. Hold for sedation   HYDROmorphone  (DILAUDID ) 2 MG tablet Take 1 tablet (2 mg total) by mouth every 4 (four) hours as needed for severe pain (pain score 7-10).   magnesium hydroxide (MILK OF MAGNESIA) 400 MG/5ML suspension Take 30 mLs by mouth daily as needed for mild constipation.   melatonin 5 MG TABS Take 5 mg by mouth as needed.   mirtazapine  (REMERON ) 30 MG tablet Take 1 tablet (30 mg total) by mouth at bedtime.   Multiple Vitamins-Minerals (PRESERVISION AREDS 2+MULTI VIT PO) Take 1 capsule by mouth in the morning and at bedtime.   pantoprazole  (PROTONIX ) 40 MG tablet Take 1 tablet (40 mg total) by mouth daily.   polyethylene glycol (MIRALAX  / GLYCOLAX ) 17 g packet Take 17 g by mouth as needed.   polyethylene glycol (MIRALAX  / GLYCOLAX ) 17 g packet Take 17 g by mouth daily.   predniSONE  (DELTASONE ) 5 MG tablet Take 5 mg by mouth daily with breakfast.   promethazine (PHENERGAN) 12.5 MG suppository Place 12.5 mg rectally every 6 (six) hours as needed for nausea or vomiting.   senna-docusate (SENOKOT-S) 8.6-50 MG tablet Take 2 tablets by mouth at bedtime.    tamsulosin  (FLOMAX ) 0.4 MG CAPS capsule Take 0.4 mg by mouth daily.   timolol  (TIMOPTIC ) 0.5 % ophthalmic solution Place 1 drop into both eyes daily.   zinc oxide 20 % ointment Apply 1 application topically as needed for irritation.   No facility-administered encounter medications on file as of 11/13/2023.    Review of Systems  Constitutional:  Negative for activity change, appetite change and fever.  HENT:  Negative for sore throat.   Eyes: Negative.   Cardiovascular:  Negative for chest pain and leg swelling.  Gastrointestinal:  Negative for abdominal distention, diarrhea and vomiting.  Genitourinary:  Negative for dysuria, frequency and urgency.  Musculoskeletal:  Positive for back pain.  Skin:  Negative for color change.  Neurological:  Negative for dizziness and headaches.  Psychiatric/Behavioral:  Positive for sleep disturbance. Negative for behavioral problems. The patient is not nervous/anxious.        Immunization History  Administered Date(s) Administered   Fluad Quad(high Dose 65+) 09/06/2022, 09/27/2023   Influenza, High Dose Seasonal PF 08/07/2016   Influenza-Unspecified 08/04/2020, 09/06/2021   Moderna SARS-COV2 Booster Vaccination 09/09/2020   Moderna Sars-Covid-2 Vaccination 11/18/2019, 12/15/2019, 09/19/2022   PFIZER(Purple Top)SARS-COV-2 Vaccination 08/03/2021   PNEUMOCOCCAL CONJUGATE-20 02/10/2022   Pfizer Covid-19 Vaccine Bivalent Booster 21yrs & up 04/26/2022   Pneumococcal-Unspecified 09/13/2020   Tdap 02/10/2022   Pertinent  Health Maintenance Due  Topic Date Due   INFLUENZA VACCINE  Completed      12/23/2021   12:37 PM 10/27/2022   11:09 AM 01/19/2023    2:15 PM 05/04/2023    3:25 PM 10/15/2023    6:50 PM  Fall Risk  Falls in the past year? 1 0 1 0 0  Was there an injury with Fall? 1 0 0 0 0  Fall Risk Category Calculator 3 0 1 0 0  Fall Risk Category (Retired) High Low     (RETIRED) Patient Fall Risk Level High fall risk High fall risk      Patient at Risk for Falls Due to History of fall(s);Impaired balance/gait;Impaired mobility;Orthopedic patient History of fall(s) History of fall(s);Impaired mobility History of fall(s);Impaired balance/gait;Impaired mobility History of fall(s);Impaired balance/gait;Impaired mobility  Fall risk Follow up Falls evaluation completed;Education provided;Falls prevention discussed Falls evaluation completed Falls evaluation completed;Falls prevention discussed;Education provided Falls evaluation completed;Education provided;Falls prevention discussed Falls evaluation completed;Education provided;Falls prevention discussed     Vitals:   11/13/23 1638  BP: 100/72  Pulse: 85  Temp: (!) 97.3 F (36.3 C)  SpO2: 96%  Weight: 171 lb (77.6 kg)   Body mass index is 27.6 kg/m.  Physical Exam Constitutional:      Appearance: Normal appearance.  HENT:     Head: Normocephalic and atraumatic.     Mouth/Throat:     Mouth: Mucous membranes are moist.  Eyes:     Conjunctiva/sclera: Conjunctivae normal.  Cardiovascular:     Rate and Rhythm: Normal rate and regular rhythm.     Pulses: Normal pulses.     Heart sounds: Normal heart sounds.  Pulmonary:     Effort: Pulmonary effort is normal.     Breath sounds: Normal breath sounds.  Abdominal:     General: Bowel sounds are normal.     Palpations: Abdomen is soft.  Musculoskeletal:        General: No swelling.     Cervical back: Normal range of motion.  Skin:    General: Skin is warm and dry.  Neurological:     General: No focal deficit present.     Mental Status: He is alert and oriented to person, place, and time.  Psychiatric:        Mood and Affect: Mood normal.        Behavior: Behavior normal.        Thought Content: Thought content normal.        Judgment: Judgment normal.       Labs reviewed: Recent Labs    05/07/23 0000 06/15/23 0000 10/20/23 0000  NA 143 140 137  K 4.0 4.0 4.7  CL 108 104 102  CO2 25* 25* 27*  BUN 20 18  28*  CREATININE 1.4* 1.4* 1.5*  CALCIUM  8.6* 9.1 8.3*   Recent Labs    01/24/23 0000 05/07/23 0000  AST 24 22  ALT 27 24  ALKPHOS 74 72  ALBUMIN 3.4* 3.4*   Recent Labs    01/24/23 0000 05/07/23 0000 06/15/23 0000 10/20/23 0000  WBC 6.6 8.3 6.4 11.0  NEUTROABS 3,802.00 4,598.00 3,494.00  --   HGB 12.0* 11.6* 11.9* 11.4*  HCT 37* 36* 37* 35*  PLT 169 164 167 174   Lab Results  Component Value Date   TSH 2.47 07/30/2023   No results found for: HGBA1C Lab Results  Component Value Date   CHOL 186 01/24/2023   HDL 73 (A) 01/24/2023   LDLCALC 94 01/24/2023   TRIG 96 01/24/2023    Significant Diagnostic Results in last 30 days:  No results found.  Assessment/Plan  1. Chronic midline low back pain with bilateral sciatica (Primary) -   Dr. Branson recommended a fentanyl  12 mcg/HR patch every 72 hours, Dilaudid  2 mg p.o. every 4 hours PRN and gabapentin  300 mg at bedtime -    Oxycodone  will be discontinued 12 hours after applying fentanyl  patch -   Discontinue tramadol , Robaxin  -  Patient and family in agreement to the recommendations as -  continue palliative care management  2. Insomnia secondary to depression with anxiety -   Discontinue trazodone  -   Increase mirtazapine  from 15 mg nightly to 30 mg nightly   Family/ staff Communication: Discussed plan of care with resident, family and charge nurse.  Labs/tests ordered:  None    Lyvia Mondesir Medina-Vargas, DNP, MSN, FNP-BC [facility] [phone] (Monday-Friday 8:00 a.m. - 5:00 p.m.) [phone] (after hours)

## 2023-11-15 ENCOUNTER — Other Ambulatory Visit: Payer: Self-pay | Admitting: Specialist

## 2023-11-15 DIAGNOSIS — M546 Pain in thoracic spine: Secondary | ICD-10-CM

## 2023-11-15 NOTE — Addendum Note (Signed)
 Addended by: Kenard Gower C on: 11/15/2023 12:18 PM   Modules accepted: Level of Service

## 2023-11-16 ENCOUNTER — Non-Acute Institutional Stay: Admitting: Orthopedic Surgery

## 2023-11-16 ENCOUNTER — Encounter: Payer: Self-pay | Admitting: Orthopedic Surgery

## 2023-11-16 DIAGNOSIS — G8929 Other chronic pain: Secondary | ICD-10-CM

## 2023-11-16 DIAGNOSIS — M5441 Lumbago with sciatica, right side: Secondary | ICD-10-CM

## 2023-11-16 DIAGNOSIS — M5442 Lumbago with sciatica, left side: Secondary | ICD-10-CM | POA: Diagnosis not present

## 2023-11-16 DIAGNOSIS — R531 Weakness: Secondary | ICD-10-CM | POA: Diagnosis not present

## 2023-11-16 DIAGNOSIS — Z7189 Other specified counseling: Secondary | ICD-10-CM

## 2023-11-16 NOTE — Progress Notes (Signed)
 Location:  [facility] Room Number: 37/A Place of Service:  ALF [phone]) Provider:  Greig FORBES Cluster, NP   Charlanne Fredia CROME, MD  Patient Care Team: Charlanne Fredia CROME, MD as PCP - General (Internal Medicine) Livingston Rigg, MD (Inactive) as Consulting Physician (Dermatology)  Extended Emergency Contact Information Primary Emergency Contact: Eliza Lamar CROME Address: [address]          [place], [place] [address] [place] Home Phone: [phone] Work Phone: [phone] Mobile Phone: [phone] Relation: Son Secondary Emergency Contact: Granda,Chris Mobile Phone: [phone] Relation: Son Preferred language: English  Code Status:   Goals of care: Advanced Directive information    11/09/2023   11:01 AM  Advanced Directives  Does Patient Have a Medical Advance Directive? Yes  Type of Estate Agent of [place];Living will;Out of facility DNR (pink MOST or yellow form)  Does patient want to make changes to medical advance directive? No - Patient declined  Copy of Healthcare Power of Attorney in Chart? Yes - validated most recent copy scanned in chart (See row information)     Chief Complaint  Patient presents with   Acute Visit    Chronic back pain     HPI:  Pt is a 89 y.o. male seen today for acute visit due to ongoing back pain.   He currently resides on the assisted living unit at [facility]. PMH: GERD, constipation, HTN, afib, tachy-brady syndrome s/p pacemaker, left hip fracture s/p LATH 09/2021 , CKD, anemia, BPH, insomnia, depression.   12/31 he was evaluated by palliative provider, Dr. Branson. He was started on dilaudid  tablets and fentanyl  patch due to ongoing low back pain (suspected compression fracture). Today, his pain has improved with new medications. He is still having increased pain with movement/transfers. Nursing reports increased weakness. He is needing a hoyer to transfer. Appetite has also  decreased. This morning his daughter helped him have some applesauce and a few sips of water . He denies feeling hungry at this time. Palliative provider is scheduled to discuss hospice later this morning. Family agreeable to comfort approach. Afebrile. Vitals stable.    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/31/2020   History of kidney stones    Hypertension    Kidney stone    Presence of permanent cardiac pacemaker 06/03/2020   SCC (squamous cell carcinoma) 07/17/2008   SCC WELL DIFF (RIGHT HELICAL MARGIN)(MOHS)   SCC (squamous cell carcinoma) 03/24/2013   SCC IN SITU (BACK SCALP)(TX AFTER BX)   SCC (squamous cell carcinoma) 03/24/2013   SCC IN SITU (RIGHT SCALP)(TX AFTER BX)   SCC (squamous cell carcinoma) 03/24/2013   SCC IN SITU (MID SCALP)(TX AFTER BX)   SCC (squamous cell carcinoma) 03/24/2013   SCC IN SITU (MID NECK)(TX AFTER BX)   Spinal stenosis    Squamous cell carcinoma of skin 11/25/1997   ant right scalp    Tachy-brady syndrome (HCC) 06/03/2020   Past Surgical History:  Procedure Laterality Date   addnoid     CYSTOSCOPY W/ URETEROSCOPY  1980   INSERT / REPLACE / REMOVE PACEMAKER  06/03/2020   KNEE SURGERY     NAILBED REPAIR Left 06/27/2016   Procedure: NAILBED biopsy left thumb;  Surgeon: Arley Curia, MD;  Location: [facility];  Service: Orthopedics;  Laterality: Left;  FAB   PACEMAKER IMPLANT N/A 06/03/2020   Procedure: PACEMAKER IMPLANT;  Surgeon: Inocencio Soyla Lunger, MD;  [facility]: [facility];  Service: Cardiovascular;  Laterality: N/A;   TONSILLECTOMY     TOTAL HIP ARTHROPLASTY Left 10/04/2021    Procedure: TOTAL HIP ARTHROPLASTY ANTERIOR APPROACH;  Surgeon: Ernie Cough, MD;  Location: [facility];  Service: Orthopedics;  Laterality: Left;    No Known Allergies  Outpatient Encounter Medications as of 11/16/2023  Medication Sig   acetaminophen  (TYLENOL ) 500 MG tablet Take 2 tablets (1,000 mg total) by mouth 3 (three) times daily as needed.   amiodarone  (PACERONE ) 200 MG tablet Take 200 mg by mouth daily.   amoxicillin (AMOXIL) 500 MG tablet Take 2,000 mg by mouth once as needed. Take one hour prior to dental procedures   apixaban  (ELIQUIS ) 2.5 MG TABS tablet Take 1 tablet (2.5 mg total) by mouth 2 (two) times daily.   atorvastatin  (LIPITOR) 10 MG tablet Take 10 mg by mouth daily.   calcitonin, salmon, (MIACALCIN/FORTICAL) 200 UNIT/ACT nasal spray Place 1 spray into alternate nostrils daily.   cetirizine  (ZYRTEC ) 10 MG tablet Take 10 mg by mouth as needed for allergies.   cyanocobalamin 1000 MCG tablet Take 1,000 mcg by mouth every morning.   diltiazem  (CARDIZEM  CD) 120 MG 24 hr capsule Take 120 mg by mouth daily.   docusate sodium  (COLACE) 100 MG capsule Take 1 capsule (100 mg total) by mouth 2 (two) times daily.   fentaNYL  (DURAGESIC ) 12 MCG/HR Place 1 patch onto the skin every 3 (three) days.   furosemide  (LASIX ) 20 MG tablet Take 20 mg by mouth daily.   gabapentin  (NEURONTIN ) 300 MG capsule Take 1 capsule (300 mg total) by mouth at bedtime. Hold for sedation   HYDROmorphone  (DILAUDID ) 2 MG tablet Take 1 tablet (2 mg total) by mouth every 4 (four) hours as needed for severe pain (pain score 7-10).   magnesium hydroxide (MILK OF MAGNESIA) 400 MG/5ML suspension Take 30 mLs by mouth daily as needed for mild constipation.   melatonin 5 MG TABS Take 5 mg by mouth as needed.   mirtazapine  (REMERON ) 30 MG tablet Take 1 tablet (30 mg total) by mouth at bedtime.   Multiple Vitamins-Minerals (PRESERVISION AREDS 2+MULTI VIT PO) Take 1 capsule by mouth in the morning and at bedtime.    pantoprazole  (PROTONIX ) 40 MG tablet Take 1 tablet (40 mg total) by mouth daily.   polyethylene glycol (MIRALAX  / GLYCOLAX ) 17 g packet Take 17 g by mouth as needed.   polyethylene glycol (MIRALAX  / GLYCOLAX ) 17 g packet Take 17 g by mouth daily.   predniSONE  (DELTASONE ) 5 MG tablet Take 5 mg by mouth daily with breakfast.   promethazine (PHENERGAN) 12.5 MG suppository Place 12.5 mg rectally every 6 (six) hours as needed for nausea or vomiting.   senna-docusate (SENOKOT-S) 8.6-50 MG tablet Take 2 tablets by mouth at bedtime.   tamsulosin  (FLOMAX ) 0.4 MG CAPS capsule Take 0.4 mg by mouth daily.   timolol  (TIMOPTIC ) 0.5 % ophthalmic solution Place 1 drop into both eyes daily.   zinc oxide 20 % ointment Apply 1 application topically as needed for irritation.   No facility-administered encounter medications on file as  of 11/16/2023.    Review of Systems  Unable to perform ROS: Other    Immunization History  Administered Date(s) Administered   Fluad Quad(high Dose 65+) 09/06/2022, 09/27/2023   Influenza, High Dose Seasonal PF 08/07/2016   Influenza-Unspecified 08/04/2020, 09/06/2021   Moderna SARS-COV2 Booster Vaccination 09/09/2020   Moderna Sars-Covid-2 Vaccination 11/18/2019, 12/15/2019, 09/19/2022   PFIZER(Purple Top)SARS-COV-2 Vaccination 08/03/2021   PNEUMOCOCCAL CONJUGATE-20 02/10/2022   Pfizer Covid-19 Vaccine Bivalent Booster 45yrs & up 04/26/2022   Pneumococcal-Unspecified 09/13/2020   Tdap 02/10/2022   Pertinent  Health Maintenance Due  Topic Date Due   INFLUENZA VACCINE  Completed      12/23/2021   12:37 PM 10/27/2022   11:09 AM 01/19/2023    2:15 PM 05/04/2023    3:25 PM 10/15/2023    6:50 PM  Fall Risk  Falls in the past year? 1 0 1 0 0  Was there an injury with Fall? 1 0 0 0 0  Fall Risk Category Calculator 3 0 1 0 0  Fall Risk Category (Retired) High Low     (RETIRED) Patient Fall Risk Level High fall risk High fall risk     Patient at Risk for Falls Due to History  of fall(s);Impaired balance/gait;Impaired mobility;Orthopedic patient History of fall(s) History of fall(s);Impaired mobility History of fall(s);Impaired balance/gait;Impaired mobility History of fall(s);Impaired balance/gait;Impaired mobility  Fall risk Follow up Falls evaluation completed;Education provided;Falls prevention discussed Falls evaluation completed Falls evaluation completed;Falls prevention discussed;Education provided Falls evaluation completed;Education provided;Falls prevention discussed Falls evaluation completed;Education provided;Falls prevention discussed   Functional Status Survey:    Vitals:   11/16/23 1154  BP: 130/81  Pulse: 72  Resp: 20  Temp: 98.6 F (37 C)  SpO2: 94%  Weight: 174 lb (78.9 kg)  Height: 5' 6 (1.676 m)   Body mass index is 28.08 kg/m. Physical Exam Vitals reviewed.  Constitutional:      General: He is not in acute distress. Eyes:     General:        Right eye: No discharge.        Left eye: No discharge.  Cardiovascular:     Rate and Rhythm: Normal rate. Rhythm irregular.     Pulses: Normal pulses.     Heart sounds: Normal heart sounds.  Pulmonary:     Effort: Pulmonary effort is normal.     Breath sounds: Normal breath sounds.  Abdominal:     Palpations: Abdomen is soft.  Musculoskeletal:     Cervical back: Neck supple.     Right lower leg: No edema.     Left lower leg: No edema.  Skin:    General: Skin is warm.     Capillary Refill: Capillary refill takes less than 2 seconds.  Neurological:     General: No focal deficit present.     Mental Status: He is easily aroused.     Motor: Weakness present.     Gait: Gait abnormal.  Psychiatric:        Mood and Affect: Mood normal.     Labs reviewed: Recent Labs    05/07/23 0000 06/15/23 0000 10/20/23 0000  NA 143 140 137  K 4.0 4.0 4.7  CL 108 104 102  CO2 25* 25* 27*  BUN 20 18 28*  CREATININE 1.4* 1.4* 1.5*  CALCIUM  8.6* 9.1 8.3*   Recent Labs    01/24/23 0000  05/07/23 0000  AST 24 22  ALT 27 24  ALKPHOS 74 72  ALBUMIN 3.4* 3.4*  Recent Labs    01/24/23 0000 05/07/23 0000 06/15/23 0000 10/20/23 0000  WBC 6.6 8.3 6.4 11.0  NEUTROABS 3,802.00 4,598.00 3,494.00  --   HGB 12.0* 11.6* 11.9* 11.4*  HCT 37* 36* 37* 35*  PLT 169 164 167 174   Lab Results  Component Value Date   TSH 2.47 07/30/2023   No results found for: HGBA1C Lab Results  Component Value Date   CHOL 186 01/24/2023   HDL 73 (A) 01/24/2023   LDLCALC 94 01/24/2023   TRIG 96 01/24/2023    Significant Diagnostic Results in last 30 days:  No results found.  Assessment/Plan 1. Chronic bilateral low back pain with bilateral sciatica (Primary) - ongoing - 12/23 [facility] urgent care visit> suspected T11 compression fracture  - 12/31 palliative started dilaudid  tablets and fentanyl  patch - pain appears controlled - decreased appetite> suspect SE of narcotics - PT/OT on hold  2. Weakness - ongoing - hoyer transfer today - recommend SNF transfer> waiting to discuss with son  3. Advanced care planning/counseling discussion - family wanting comfort approach - Palliative visit today to determine hospice> family agreeable to hospice> social worker offered staying in room with 24 hr hired care    Family/ staff Communication: plan discussed with patient and nurse  Labs/tests ordered: none

## 2023-12-20 ENCOUNTER — Telehealth: Payer: Self-pay | Admitting: Cardiology

## 2023-12-20 NOTE — Telephone Encounter (Signed)
 Son Stephen Cuevas) called to let Dr. Berry Cuevas know patient passed away 3 weeks ago.

## 2023-12-20 NOTE — Telephone Encounter (Signed)
 Thank you, please make chart inactive

## 2023-12-20 NOTE — Telephone Encounter (Signed)
 I left a message with his son Derel Mcglasson and offered him my personal condolences.
